# Patient Record
Sex: Male | Born: 1970
Health system: Southern US, Community
[De-identification: ages and names within clinical notes are randomized; demographics above are authoritative.]

## PROBLEM LIST (undated history)

## (undated) DIAGNOSIS — Z923 Personal history of irradiation: Secondary | ICD-10-CM

## (undated) DIAGNOSIS — C801 Malignant (primary) neoplasm, unspecified: Secondary | ICD-10-CM

---

## 1997-02-14 DIAGNOSIS — C8590 Non-Hodgkin lymphoma, unspecified, unspecified site: Secondary | ICD-10-CM

## 1997-02-14 HISTORY — DX: Non-Hodgkin lymphoma, unspecified, unspecified site: C85.90

## 2008-11-22 ENCOUNTER — Observation Stay (HOSPITAL_COMMUNITY): Admission: EM | Admit: 2008-11-22 | Discharge: 2008-11-22 | Payer: Self-pay | Admitting: Emergency Medicine

## 2010-03-09 ENCOUNTER — Inpatient Hospital Stay (HOSPITAL_COMMUNITY): Admission: EM | Admit: 2010-03-09 | Discharge: 2010-03-12 | Payer: Self-pay | Source: Home / Self Care

## 2010-03-10 LAB — DIFFERENTIAL
Basophils Absolute: 0.1 10*3/uL (ref 0.0–0.1)
Basophils Relative: 1 % (ref 0–1)
Eosinophils Absolute: 0.3 10*3/uL (ref 0.0–0.7)
Monocytes Absolute: 1 10*3/uL (ref 0.1–1.0)
Monocytes Relative: 9 % (ref 3–12)
Neutro Abs: 6.4 10*3/uL (ref 1.7–7.7)
Neutrophils Relative %: 60 % (ref 43–77)

## 2010-03-10 LAB — CBC
Hemoglobin: 11.4 g/dL — ABNORMAL LOW (ref 13.0–17.0)
MCH: 28.1 pg (ref 26.0–34.0)
MCHC: 34.5 g/dL (ref 30.0–36.0)
Platelets: 299 10*3/uL (ref 150–400)
Platelets: 270 10*3/uL (ref 150–400)
RBC: 4.11 MIL/uL — ABNORMAL LOW (ref 4.22–5.81)
WBC: 11.1 10*3/uL — ABNORMAL HIGH (ref 4.0–10.5)

## 2010-03-10 LAB — COMPREHENSIVE METABOLIC PANEL
ALT: 15 U/L (ref 0–53)
AST: 12 U/L (ref 0–37)
Albumin: 2.8 g/dL — ABNORMAL LOW (ref 3.5–5.2)
Alkaline Phosphatase: 63 U/L (ref 39–117)
Chloride: 104 mEq/L (ref 96–112)
Creatinine, Ser: 0.98 mg/dL (ref 0.4–1.5)
GFR calc Af Amer: >60 mL/min (ref >60)
Potassium: 4 mEq/L (ref 3.5–5.1)
Sodium: 139 mEq/L (ref 135–145)
Total Bilirubin: 0.4 mg/dL (ref 0.3–1.2)

## 2010-03-10 LAB — POCT I-STAT, CHEM 8
HCT: 42 % (ref 39.0–52.0)
Hemoglobin: 14.3 g/dL (ref 13.0–17.0)
Potassium: 4 mEq/L (ref 3.5–5.1)
Sodium: 140 mEq/L (ref 135–145)
TCO2: 28 mmol/L (ref 0–100)

## 2010-03-11 LAB — CBC
HCT: 32.9 % — ABNORMAL LOW (ref 39.0–52.0)
MCHC: 34.7 g/dL (ref 30.0–36.0)
MCV: 81.6 fL (ref 78.0–100.0)
RDW: 13.9 % (ref 11.5–15.5)

## 2010-03-11 NOTE — Op Note (Addendum)
NAMEBRANDOL, CORP                 ACCOUNT NO.:  [id number]  MEDICAL RECORD NO.:  [id number]          PATIENT TYPE:  INP  LOCATION:  0103                         FACILITY:  [facility]  PHYSICIAN:  Angelia Mould. Derrell Lolling, M.D.DATE OF BIRTH:  09-05-1970  DATE OF PROCEDURE:  03/10/2010 DATE OF DISCHARGE:                              OPERATIVE REPORT   PREOPERATIVE DIAGNOSES: 1. Acute and chronic gluteal abscesses. 2. Extensive, chronic genital and perianal condylomata. 3. Remote history Hodgkin's disease.  POSTOPERATIVE DIAGNOSES: 1. Acute and chronic gluteal abscesses. 2. Extensive, chronic genital and perianal condylomata. 3. Remote history Hodgkin's disease.  OPERATION PERFORMED: 1. Incision and drainage, multiple gluteal abscesses. 2. Excisional biopsy, perianal condylomata (rule out squamous cell     carcinoma).  SURGEON:  Angelia Mould. Derrell Lolling, M.D.  OPERATIVE INDICATIONS:  This is a 40 year old African-American gentleman who has had chronic perianal and genital abscesses and condylomata.  He had a right inguinal abscess incised and drained by Dr. Dwain Sarna in 2010 which healed.  He does not have regular medical care.  He has chronic fungating condylomata at the base of his penis, at the perianal areas and the inguinal areas but he has not sought any attention for this.  He says he intermittently has abscesses which spontaneously drain.  He comes in tonight because of increasing pain and swelling in the left gluteal area.  On exam, the entire left gluteal area near the midline is indurated.  There are couple of draining areas.  This feels a little bit fluctuant.  The skin is quite thickened and hard suggesting there may be a chronic component to this.  There are  multiple condylomata in the genital and perianal areas.  He was started on intravenous antibiotics and brought to the operating room for exam and drainage.  OPERATIVE FINDINGS:  Digital rectal exam revealed normal  sphincter tone and a normal anal canal and distal rectum, but there was extensive condylomata externally.  I did not feel any internally.  In the left gluteal area and left anterior and left posterior, there are multiple small abscesses.  In the right gluteal area in the right anterior, there was 1 small abscess.  None of these were confluent.  They did not connect with each other.  There was no necrosis.  This does not look like a Fournier's gangrene.  The scrotum was not infected.  The penis was not infected.  The inguinal areas did not appear infected.  OPERATIVE TECHNIQUE:  Following the induction of general endotracheal anesthesia, the patient was placed in dorsal lithotomy position. Perianal and genital areas were prepped and draped in a sterile fashion. Digital rectal exam was performed with the findings as described above. There did not appear to be any internal disease.  I placed about 4 radially oriented incisions in the left gluteal area.  I drained small abscesses.  I very aggressively spread the tissues and digitally explored them and they did not communicate with each other.  Cultures were taken.  I made a radially oriented incision in the right anterior gluteal area and drained a small abscess there.  I then excised  about a 2 x 2 cm area of condylomata in the perianal area and sent that to the lab to rule out squamous cell carcinoma.  Hemostasis was good, although the tissues were oozing somewhat.  I loosely packed all of the open wounds with 1-inch Iodoform gauze.  I then placed bulky absorbent bandages and fishnet panties, and the patient was taken to recovery room in stable condition.  Estimated blood loss was probably about 30 to 50 cc.  Complications were none.  Sponge, needle and instrument counts were correct.     Angelia Mould. Derrell Lolling, M.D.     HMI/MEDQ  D:  03/10/2010  T:  03/10/2010  Job:  [id number]  Electronically Signed by Claud Kelp M.D. on 03/11/2010  06:36:39 AM

## 2010-03-11 NOTE — H&P (Addendum)
Dylan [name], Dylan [name]                 ACCOUNT NO.:  [id number]  MEDICAL RECORD NO.:  [id number]          PATIENT TYPE:  EMS  LOCATION:  ED                           FACILITY:  [facility]  PHYSICIAN:  Angelia Mould. Derrell Lolling, M.D.DATE OF BIRTH:  May 10, 1970  DATE OF ADMISSION:  03/09/2010 DATE OF DISCHARGE:                             HISTORY & PHYSICAL   CHIEF COMPLAINT:  Pain and swelling in the left gluteal area for 48 hours.  HISTORY OF PRESENT ILLNESS:  This is a 40 year old African-American man who states that he has chronic warts in his penile and perianal areas for few years.  He has had a right groin abscess drained by Dr. Dwain Sarna on November 23, 2008.  He has occasional small abscesses that spontaneously drain.  He now presents with a 48-hour history of progressive pain and swelling of the left perianal gluteal area.  He has not had any drainage from this at this point in time.  I was asked to see him by the emergency department staff.  PAST HISTORY:  Treated for Hodgkin's disease with chemotherapy in the past.  He has a port in the right infraclavicular area.  He is not followed regularly by anyone, however.  He had incision and drainage of right groin abscess by Dr. Dwain Sarna on November 21, 2008.  He denies HIV. He denies hepatitis.  He denies asthma, diabetes, sickle cell or coronary artery disease.  CURRENT MEDICATIONS:  None.  DRUG ALLERGIES:  None known.  FAMILY HISTORY:  Mother living and well.  Father history unknown.  SOCIAL HISTORY:  The patient is single, lives with a daughter.  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  He denies any street drugs. He denies alcohol.  He smokes 1/2 pack of cigarettes per day.  REVIEW OF SYSTEMS:  Ten-system review of systems is negative except as described above.  PHYSICAL EXAMINATION:  GENERAL:  Alert, African-American male lying on his right side.  Temperature 99.6, heart rate 99, blood pressure 126/74, respiratory  rate 20, oxygen saturation 97% on room air. HEENT:  Eyes, sclerae are clear.  Extraocular movements intact. NECK:  Supple, nontender.  No mass.  Trachea midline.  No jugular venous distention. LUNGS:  Clear to auscultation, respirations unlabored. HEART:  Regular rate and rhythm.  No ectopy, no murmur.  Radial and femoral pulses are palpable. ABDOMEN:  Soft, nontender.  Normal bowel sounds, nondistended. GENITOURINARY:  He has chronic verrucous condylomata of the base of his penis in the suprapubic area and along the perianal area.  He has a large cellulitis and abscess involving the left anterior and left posterior area and perianal and gluteal areas.  There is lots of condyloma that is chronic around his perianal area. EXTREMITIES:  Moves all 4 extremities well without pain or deformity. NEUROLOGIC:  No gross motor or sensory deficits.  LABORATORY DATA:  Admission data; hemoglobin 13.3, white count 10,700 with normal differential.  BUN 8, creatinine 1.0, glucose 92, potassium 4.0.  CT scan of the pelvis showed superficial inflammation along the medial crease of the left buttock within the subcutaneous fat.  There are a  few little air bubbles here.  ASSESSMENT: 1. Acute soft tissue infection, left gluteal area. 2. Chronic verrucous lesions of the genitalia. 3. History of chemotherapy for Hodgkin's disease. 4. Status post Port-A-Cath insertion.  PLAN: 1. The patient will be taken to the operating room for examination     under anesthesia and drainage of his acute abscess. 2. I told him that I would not be able to handle or manage all of his     verrucous lesions as these were chronic and he would need to seek     management of those in the future electively. 3. I discussed the indications in details of surgery with him.  Risks     and complications have been outlined, including not limited to     bleeding, infection, injury to the rectum with incontinence,     extensive  debridement of tissues if the infection is necrotizing,     cardiac, pulmonary and thromboembolic problems.  He seems to     understand these issues well.  This time all his questions were     answered.  He is in full agreement with this plan.     Angelia Mould. Derrell Lolling, M.D.     HMI/MEDQ  D:  03/10/2010  T:  03/10/2010  Job:  [id number]  Electronically Signed by Claud Kelp M.D. on 03/11/2010 06:33:51 AM

## 2010-03-13 LAB — WOUND CULTURE

## 2010-03-15 LAB — ANAEROBIC CULTURE

## 2010-03-15 NOTE — Discharge Summary (Signed)
NAMEJUSTYCE, BABY                 ACCOUNT NO.:  [id number]  MEDICAL RECORD NO.:  [id number]          PATIENT TYPE:  INP  LOCATION:  1516                         FACILITY:  [facility]  PHYSICIAN:  Juanetta Gosling, MDDATE OF BIRTH:  1970-05-31  DATE OF ADMISSION:  03/09/2010 DATE OF DISCHARGE:  03/12/2010                              DISCHARGE SUMMARY   DISCHARGING PHYSICIAN:  Emelia Loron, M.D.  CONSULTATIONS:  None.  PROCEDURES: 1. Incision and drainage of multiple gluteal abscesses. 2. Excisional biopsy of perianal condyloma on March 10, 2010, by Dr.     Claud Kelp.  REASON FOR ADMISSION:  Mr. Mcdiarmid is a 40 year old African-American male who has chronic warts on his penile and perianal areas for a few years. He developed a right groin abscess that was drained approximately a year ago.  He developed worsening pain approximately 48 hours prior to admission with progressive swelling of the left perianal gluteal area. He presented to the Emergency Department for further evaluation of this area.  We were asked to evaluate the patient for surgical admission for drainage.  ADMITTING DIAGNOSES: 1. Acute soft tissue infection of the left gluteal area. 2. Chronic verrucous lesions of the genitalia. 3. History of chemotherapy for Hodgkin's disease. 4. Status post Port-A-Cath insertion.  HOSPITAL COURSE:  At this time the patient was admitted and placed on IV Invanz and vancomycin.  He was then taken to the operating room where he had multiple I and Ds of his gluteal abscesses as well as an excisional biopsy of one of his condyloma to rule out squamous cell carcinoma. Postoperatively the patient did well.  He initially began having b.i.d. dressing changes for several days along with sitz baths.  By postoperative day #2 the patient had a significant decrease in induration as well as edema.  After discussion with the physician, it was felt at this time that the patient  should continue sitz baths at home but did not require dressing changes.  His pathology results did come back on postoperative day #2 which revealed squamous cell carcinoma.  Therefore, at this time we are trying to get in touch with the Cancer Center to set the patient up with a medical oncologist for further treatment.  Otherwise the patient is felt stable for discharge home at this time.  DISCHARGE DIAGNOSES: 1. Multiple gluteal abscesses status post incision and drainage. 2. 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 3. History of Hodgkin's disease.  DISCHARGE MEDICATIONS:  Please see medication reconciliation form.  DISCHARGE INSTRUCTIONS:  The patient has no activity restrictions.  He is informed to do sitz baths t.i.d.  He has no dietary restrictions.  He is to follow up with Dr. Derrell Lolling in our office in approximately 2 weeks. He is also to follow up with the Oncology Center.  I have not been able to get in touch with them yet, but I have given them my pager number to call me back to set up an appointment for the patient.  I will then have them call the patient with that appointment time for appropriate  followup.     Letha Cape, PA   ______________________________ Juanetta Gosling, MD    KEO/MEDQ  D:  03/12/2010  T:  03/12/2010  Job:  [id number]  cc:   Angelia Mould. Derrell Lolling, M.D. [address], [address] [place] [address]  [facility]  Electronically Signed by Barnetta Chapel PA on 03/15/2010 01:19:20 PM Electronically Signed by Emelia Loron MD on 03/15/2010 04:26:16 PM

## 2010-04-07 ENCOUNTER — Ambulatory Visit (HOSPITAL_COMMUNITY)
Admission: RE | Admit: 2010-04-07 | Discharge: 2010-04-07 | Disposition: A | Discharge status: Home / Self Care | Payer: Medicare Other | Source: Ambulatory Visit | Attending: Hematology and Oncology | Admitting: Hematology and Oncology

## 2010-04-07 ENCOUNTER — Other Ambulatory Visit: Payer: Self-pay | Admitting: Hematology and Oncology

## 2010-04-07 ENCOUNTER — Encounter: Payer: Self-pay | Admitting: Hematology and Oncology

## 2010-04-07 ENCOUNTER — Encounter (HOSPITAL_BASED_OUTPATIENT_CLINIC_OR_DEPARTMENT_OTHER): Payer: Medicare Other | Admitting: Hematology and Oncology

## 2010-04-07 DIAGNOSIS — C210 Malignant neoplasm of anus, unspecified: Secondary | ICD-10-CM

## 2010-04-07 DIAGNOSIS — Y838 Other surgical procedures as the cause of abnormal reaction of the patient, or of later complication, without mention of misadventure at the time of the procedure: Secondary | ICD-10-CM | POA: Insufficient documentation

## 2010-04-07 DIAGNOSIS — Z95828 Presence of other vascular implants and grafts: Secondary | ICD-10-CM

## 2010-04-07 DIAGNOSIS — C8190 Hodgkin lymphoma, unspecified, unspecified site: Secondary | ICD-10-CM

## 2010-04-07 DIAGNOSIS — T82598A Other mechanical complication of other cardiac and vascular devices and implants, initial encounter: Secondary | ICD-10-CM | POA: Insufficient documentation

## 2010-04-07 LAB — CBC WITH DIFFERENTIAL/PLATELET
EOS%: 3.5 % (ref 0.0–7.0)
MCH: 27.7 pg (ref 27.2–33.4)
MCV: 83.4 fL (ref 79.3–98.0)
MONO%: 9 % (ref 0.0–14.0)
NEUT#: 3.6 10*3/uL (ref 1.5–6.5)
RBC: 4.76 10*6/uL (ref 4.20–5.82)
RDW: 14.5 % (ref 11.0–14.6)

## 2010-04-07 LAB — COMPREHENSIVE METABOLIC PANEL
AST: 13 U/L (ref 0–37)
Albumin: 4 g/dL (ref 3.5–5.2)
Alkaline Phosphatase: 80 U/L (ref 39–117)
Potassium: 4.8 mEq/L (ref 3.5–5.3)
Sodium: 138 mEq/L (ref 135–145)
Total Protein: 8.8 g/dL — ABNORMAL HIGH (ref 6.0–8.3)

## 2010-04-08 ENCOUNTER — Ambulatory Visit (HOSPITAL_COMMUNITY)
Admission: RE | Admit: 2010-04-08 | Discharge: 2010-04-08 | Disposition: A | Discharge status: Home / Self Care | Payer: Medicare Other | Source: Ambulatory Visit | Attending: Hematology and Oncology | Admitting: Hematology and Oncology

## 2010-04-08 ENCOUNTER — Other Ambulatory Visit: Payer: Self-pay | Admitting: Hematology and Oncology

## 2010-04-08 DIAGNOSIS — Y849 Medical procedure, unspecified as the cause of abnormal reaction of the patient, or of later complication, without mention of misadventure at the time of the procedure: Secondary | ICD-10-CM | POA: Insufficient documentation

## 2010-04-08 DIAGNOSIS — T82598A Other mechanical complication of other cardiac and vascular devices and implants, initial encounter: Secondary | ICD-10-CM | POA: Insufficient documentation

## 2010-04-08 DIAGNOSIS — C8190 Hodgkin lymphoma, unspecified, unspecified site: Secondary | ICD-10-CM | POA: Insufficient documentation

## 2010-04-08 DIAGNOSIS — C210 Malignant neoplasm of anus, unspecified: Secondary | ICD-10-CM

## 2010-04-08 MED ORDER — IOHEXOL 300 MG/ML  SOLN
30.0000 mL | Freq: Once | INTRAMUSCULAR | Status: AC | PRN
  Administered 2010-04-08: 30 mL via INTRAVENOUS

## 2010-04-08 MED ORDER — IOHEXOL 300 MG/ML  SOLN: 50.0000 mL | Freq: Once | INTRAMUSCULAR | Status: AC | PRN

## 2010-04-14 ENCOUNTER — Ambulatory Visit: Payer: Medicare Other | Admitting: Radiation Oncology

## 2010-04-19 ENCOUNTER — Encounter (INDEPENDENT_AMBULATORY_CARE_PROVIDER_SITE_OTHER): Payer: Medicare Other | Admitting: Surgery

## 2010-04-19 ENCOUNTER — Ambulatory Visit (HOSPITAL_COMMUNITY): Admission: RE | Admit: 2010-04-19 | Payer: Medicare Other | Source: Ambulatory Visit

## 2010-04-19 DIAGNOSIS — T80212A Local infection due to central venous catheter, initial encounter: Secondary | ICD-10-CM

## 2010-04-20 NOTE — Assessment & Plan Note (Signed)
OFFICE VISIT  Jarnagin, Vaishnav L DOB:  Nov 14, 1970                                       04/19/2010 ZOXWR#:[id number]  REASON FOR VISIT:  Retained catheter in the subclavian and superior vena cava.  HISTORY:  This is a 40 year old gentleman with a history of Hodgkin's disease.  He had a Port-A-Cath placed in [place], [place], approximately 8 years ago.  It has not been used for approximately 5 years.  He underwent attempted removal in radiology in February.  The catheter broke.  They were able to snare the distal end however it was unable to be with removed.  He was sent to me to evaluate for surgical revision.  Other than above mentioned the patient suffers from condyloma and has undergone 2 gluteal resections in the past.  He continues to be a smoker.  He smokes a pack which lasts him approximately 2 days. Otherwise he is healthy.  REVIEW OF SYSTEMS:  Detailed review of systems was performed as documented in the encounter form.  Is only positive for history of backside rash.  PAST MEDICAL HISTORY:  Positive for lymphoma and condyloma.  SOCIAL HISTORY:  He is single with 4 children.  Works as a Designer, industrial/product. Smokes 1 pack every 2 days.  Does not drink.  FAMILY HISTORY:  Negative for premature cardiovascular disease.  ALLERGIES:  None.  MEDICATIONS:  None.  PHYSICAL EXAM:  Vital signs:  Heart rate 91, blood pressure 121/82, temperature is 97.9.  General:  He is well-appearing, in no distress. HEENT:  Within normal limits.  Lungs:  Clear bilaterally. Cardiovascular:  Regular rate and rhythm.  Abdomen:  Soft, nontender. Musculoskeletal:  No major deformity.  Neurological:  No focal deficits.  DIAGNOSTIC STUDIES:  I have reviewed his angiogram from his attempted catheter removal.  There is a looped catheter in the subclavian extending up into the internal jugular as well as down into the superior vena cava.  ASSESSMENT:  Retained foreign body.  PLAN:   I discussed surgical removal of the catheter versus leaving it in.  We talked about the potential complications of the catheter remaining in place which include distal embolization in the pulmonary a nidus for thrombose versus infection.  The patient really wants to get this out.  I detailed the risk of the operation.  I plan on doing this through a supraclavicular incision.  We discussed potential injury to the vagus and phrenic nerve as well as possible lymphatic leak and bleeding as well as potential conversion to a trap door type incision. I quoted him approximately an 80%-90% success rate of getting the catheter out.  I have discussed this with Dr. Edwyna Shell.  He will be available for the case, this has been scheduled for Thursday March 15.    Jorge Ny, MD Electronically Signed  VWB/MEDQ  D:  04/19/2010  T:  04/20/2010  Job:  [id number]  cc:   Ines Bloomer, M.D. Arn Medal, MD Juanetta Gosling, MD

## 2010-04-27 ENCOUNTER — Encounter (HOSPITAL_COMMUNITY)
Admission: RE | Admit: 2010-04-27 | Discharge: 2010-04-27 | Disposition: A | Discharge status: Home / Self Care | Payer: Medicare Other | Source: Ambulatory Visit | Attending: Hematology and Oncology | Admitting: Hematology and Oncology

## 2010-04-27 ENCOUNTER — Encounter (HOSPITAL_COMMUNITY): Payer: Self-pay

## 2010-04-27 DIAGNOSIS — C210 Malignant neoplasm of anus, unspecified: Secondary | ICD-10-CM | POA: Insufficient documentation

## 2010-04-27 DIAGNOSIS — C8190 Hodgkin lymphoma, unspecified, unspecified site: Secondary | ICD-10-CM

## 2010-04-27 DIAGNOSIS — R9389 Abnormal findings on diagnostic imaging of other specified body structures: Secondary | ICD-10-CM | POA: Insufficient documentation

## 2010-04-27 DIAGNOSIS — R911 Solitary pulmonary nodule: Secondary | ICD-10-CM | POA: Insufficient documentation

## 2010-04-27 DIAGNOSIS — C8589 Other specified types of non-Hodgkin lymphoma, extranodal and solid organ sites: Secondary | ICD-10-CM | POA: Insufficient documentation

## 2010-04-27 HISTORY — DX: Malignant (primary) neoplasm, unspecified: C80.1

## 2010-04-27 LAB — GLUCOSE, CAPILLARY: Glucose-Capillary: 114 mg/dL — ABNORMAL HIGH (ref 70–99)

## 2010-04-27 MED ORDER — FLUDEOXYGLUCOSE F - 18 (FDG) INJECTION: 16.3000 | Freq: Once | INTRAVENOUS | Status: AC | PRN

## 2010-04-28 ENCOUNTER — Other Ambulatory Visit (HOSPITAL_COMMUNITY): Payer: Medicare Other

## 2010-04-28 ENCOUNTER — Encounter (HOSPITAL_BASED_OUTPATIENT_CLINIC_OR_DEPARTMENT_OTHER): Payer: Medicare Other | Admitting: Hematology and Oncology

## 2010-04-28 DIAGNOSIS — C210 Malignant neoplasm of anus, unspecified: Secondary | ICD-10-CM

## 2010-04-29 ENCOUNTER — Inpatient Hospital Stay (HOSPITAL_COMMUNITY): Payer: Medicare Other

## 2010-04-29 ENCOUNTER — Other Ambulatory Visit: Payer: Self-pay | Admitting: Surgery

## 2010-04-29 ENCOUNTER — Inpatient Hospital Stay (HOSPITAL_COMMUNITY)
Admission: RE | Admit: 2010-04-29 | Discharge: 2010-04-30 | DRG: 316 | Disposition: A | Discharge status: Home / Self Care | Payer: Medicare Other | Source: Ambulatory Visit | Attending: Surgery | Admitting: Surgery

## 2010-04-29 DIAGNOSIS — F17200 Nicotine dependence, unspecified, uncomplicated: Secondary | ICD-10-CM | POA: Diagnosis present

## 2010-04-29 DIAGNOSIS — T82898A Other specified complication of vascular prosthetic devices, implants and grafts, initial encounter: Principal | ICD-10-CM | POA: Diagnosis present

## 2010-04-29 DIAGNOSIS — Y849 Medical procedure, unspecified as the cause of abnormal reaction of the patient, or of later complication, without mention of misadventure at the time of the procedure: Secondary | ICD-10-CM | POA: Diagnosis present

## 2010-04-29 DIAGNOSIS — T81509A Unspecified complication of foreign body accidentally left in body following unspecified procedure, initial encounter: Secondary | ICD-10-CM

## 2010-04-29 LAB — COMPREHENSIVE METABOLIC PANEL
AST: 15 U/L (ref 0–37)
Albumin: 3.3 g/dL — ABNORMAL LOW (ref 3.5–5.2)
BUN: 7 mg/dL (ref 6–23)
Calcium: 9.1 mg/dL (ref 8.4–10.5)
Chloride: 106 mEq/L (ref 96–112)
Creatinine, Ser: 0.87 mg/dL (ref 0.4–1.5)
GFR calc Af Amer: >60 mL/min (ref >60)
GFR calc non Af Amer: >60 mL/min (ref >60)
Total Bilirubin: 0.3 mg/dL (ref 0.3–1.2)

## 2010-04-29 LAB — PROTIME-INR
INR: 1.02 (ref 0.00–1.49)
Prothrombin Time: 13.6 seconds (ref 11.6–15.2)

## 2010-04-29 LAB — TYPE AND SCREEN: Antibody Screen: NEGATIVE

## 2010-04-29 LAB — SURGICAL PCR SCREEN: MRSA, PCR: NEGATIVE

## 2010-04-29 LAB — CBC
MCH: 27.4 pg (ref 26.0–34.0)
MCHC: 33.6 g/dL (ref 30.0–36.0)
MCV: 81.6 fL (ref 78.0–100.0)
Platelets: 310 10*3/uL (ref 150–400)
RDW: 14.5 % (ref 11.5–15.5)

## 2010-04-29 LAB — ABO/RH: ABO/RH(D): O POS

## 2010-04-30 ENCOUNTER — Other Ambulatory Visit (HOSPITAL_COMMUNITY): Payer: Medicare Other

## 2010-04-30 LAB — BASIC METABOLIC PANEL
BUN: 5 mg/dL — ABNORMAL LOW (ref 6–23)
Calcium: 8.7 mg/dL (ref 8.4–10.5)
GFR calc non Af Amer: >60 mL/min (ref >60)
Glucose, Bld: 80 mg/dL (ref 70–99)
Potassium: 3.9 mEq/L (ref 3.5–5.1)

## 2010-04-30 LAB — CBC
HCT: 35 % — ABNORMAL LOW (ref 39.0–52.0)
MCHC: 33.1 g/dL (ref 30.0–36.0)
MCV: 81.6 fL (ref 78.0–100.0)
Platelets: 303 10*3/uL (ref 150–400)
RDW: 14.5 % (ref 11.5–15.5)
WBC: 10 10*3/uL (ref 4.0–10.5)

## 2010-05-10 ENCOUNTER — Ambulatory Visit (HOSPITAL_COMMUNITY): Admission: RE | Admit: 2010-05-10 | Payer: Medicare Other | Source: Ambulatory Visit | Admitting: General Surgery

## 2010-05-10 ENCOUNTER — Ambulatory Visit: Payer: Medicare Other | Admitting: Surgery

## 2010-05-16 NOTE — Op Note (Signed)
Dylan [name], Dylan [name]                 ACCOUNT NO.:  [id number]  MEDICAL RECORD NO.:  [id number]           PATIENT TYPE:  I  LOCATION:  2040                         FACILITY:  [facility]  PHYSICIAN:  Juleen China IV, MDDATE OF BIRTH:  [birth year]  DATE OF PROCEDURE:  04/29/2010 DATE OF DISCHARGE:                              OPERATIVE REPORT   PREOPERATIVE DIAGNOSIS:  Retained central venous catheter.  POSTOPERATIVE DIAGNOSIS:  Retained central venous catheter.  PROCEDURE PERFORMED:  Removal of central venous catheter from the right innominate vein, patch closure of the right internal jugular vein.  SURGEON: 1. Charlena Cross, MD  ASSISTANT:  Pecola Leisure, PA  ANESTHESIA:  General.  SPECIMENS:  Retained catheter.  INDICATIONS:  Dylan [name] is a 40 year old gentleman previously treated for Hodgkin's lymphoma via a right-sided Port-A-Cath.  He presented to my office last week.  He had an attempted radiology to have his port removed, however, the catheter portion broke, could not be retained. Therefore, he comes in for surgical extraction.  I did discuss preoperatively with the patient detailing the risks and benefits of the procedure, the possibility of nonretrieval.  The details and risks were fully explained including risks of nerve injury, bleeding, and lymphatic leak.  The patient was eager to get the catheter removed.  PROCEDURE:  The patient was identified in the holding area and taken to room 6, placed supine on table.  General endotracheal anesthesia was administered.  The patient was prepped and draped in usual fashion.  A time-out was called.  Antibiotics were given.  A supraclavicular incision was made on the right beginning in the midline extending from approximately 6 cm.  Cautery was used to grab the subcutaneous tissue and platysma muscle was divided with cautery.  Subplatysmal flaps were then raised.  I identified the sternoclavicular head of  the sternocleidomastoid.  These were well defined and therefore I elected to preserve them.  I divided the tissue between 2 flaps of the sternocleidomastoid and that exposed the internal jugular vein.  The internal jugular vein was circumferentially dissected free.  I visualized the vagus nerve, protected it at the posterior side of the wound.  I then traced the internal jugular vein as far as I could below the clavicle.  I was able to get circumferential control over the subclavian vein, however, due to the depth of the wound, I was unable to get control of right innominate vein.  There was an abundance of lymphatic tissue within the wound which required multiple suture ligation with 5-0 Prolene to control the lymphatic leak.  I did not feel like a good control of the innominate vein.  The catheter was readily palpable within the internal jugular vein.  Therefore, I elected to occlude the innominate subclavian vein with vascular clamps and I used a Cooley J clamp and placed it as far centrally as I could.  I placed a pursestring suture at the internal jugular vein.  I used #11 blade to open the vein.  Because I could not get a circumferential control of the innominate vein, there was some backbleeding from where the  clamp did not occlude the vein.  I was able to identify the catheter and grasped it with a hemostat.  The catheter was incorporated into the intima of the vein.  It was very thickened and scarred in this area.  I used combination of a blunt dissection with Peanut as well as sharp dissection with Metzenbaum scissors to get circumferential control of the catheter with this.  Once I had this, I used a gentle force to extract the catheter.  With this I was able to get hemostasis with a J- shaped clamp.  There was a thickened part of the vein.  I felt that we needed to explore this and remove this thickened intima to prevent future thrombosis.  Therefore, I opened the vein  longitudinally and resected out the thickened redundant part.  Once this was done, I was satisfied with the appearance of the posterior vein wall, however, due to the length of the venotomy, I did not want to close this primarily. I therefore selected a bovine pericardial patch and performed patchy venoplasty with a running 6-0 Prolene.  Prior to completion, the veins were flushed and the patch closure was completed.  I then had fluoroscopy come to do an intraoperative imaging to make sure that all the catheter had been retrieved.  I evaluated both lung fields as well as looked the catheter where it was located.  I did not see any retained portions.  With this I irrigated the wound.  There was still a generous lymphatic leak which I was able to oversew with 5-0 Prolene.  I inspected the wound meticulously for approximately 10 minutes.  I did not see any further lymphatic leak.  For concern over leak, I did place CoSeal at the wound to seal off the potential future leaks.  At this the wound was irrigated __________.  Hemostasis was excellent.  I reapproximated the heads of the sternocleidomastoid with a running 3-0 Vicryl.  The platysma muscle was then reapproximated with running 3-0 Vicryl and the skin was closed with 3-0 Vicryl and Dermabond was placed. He was extubated and taken to recovery in stable condition.     Dylan Ny, MD     VWB/MEDQ  D:  04/29/2010  T:  04/30/2010  Job:  [id number]  Electronically Signed by Arelia Longest IV MD on 05/16/2010 09:28:36 PM

## 2010-05-17 ENCOUNTER — Ambulatory Visit (INDEPENDENT_AMBULATORY_CARE_PROVIDER_SITE_OTHER): Payer: Medicare Other | Admitting: Surgery

## 2010-05-17 DIAGNOSIS — T80212A Local infection due to central venous catheter, initial encounter: Secondary | ICD-10-CM

## 2010-05-18 NOTE — Assessment & Plan Note (Signed)
OFFICE VISIT  [name], Dylan L DOB:  01/19/1971                                       05/17/2010 ZOXWR#:[id number]  The patient comes back today for followup.  He underwent removal of a retained central venous catheter from the right innominate vein as well as patch closure of the right internal jugular vein.  This was done on 04/29/2010.  The patient did not come for his postoperative visit. However, several days ago he noted severe swelling in his incision which spontaneously drained.  He has not been having any fevers.  It is no longer draining.  It is a little sore.  PHYSICAL EXAMINATION:  There is no evidence of infection.  The incision is well-healed.  I was able to express a minimal amount of a clear/yellowish fluid more consistent with lymphatic drainage.  There was no purulence to it.  The patient most likely had developed a lymphatic lymphocele which spontaneously drained.  I am going to place him on prophylactic antibiotics to make sure this does not get infected since I did close his vein with a patch.  He has been given Keflex to be taken for 10 days.  I have scheduled him to come back to see me in 1 week.  If he is doing well he is going to call and cancel this appointment.    Jorge Ny, MD Electronically Signed  VWB/MEDQ  D:  05/17/2010  T:  05/18/2010  Job:  [id number]

## 2010-05-20 LAB — COMPREHENSIVE METABOLIC PANEL
ALT: 11 U/L (ref 0–53)
AST: 17 U/L (ref 0–37)
Albumin: 3.8 g/dL (ref 3.5–5.2)
Alkaline Phosphatase: 76 U/L (ref 39–117)
Chloride: 103 mEq/L (ref 96–112)
GFR calc Af Amer: >60 mL/min (ref >60)
Potassium: 3.7 mEq/L (ref 3.5–5.1)
Sodium: 140 mEq/L (ref 135–145)
Total Bilirubin: 0.6 mg/dL (ref 0.3–1.2)
Total Protein: 8.3 g/dL (ref 6.0–8.3)

## 2010-05-20 LAB — ANAEROBIC CULTURE

## 2010-05-20 LAB — DIFFERENTIAL
Basophils Relative: 1 % (ref 0–1)
Eosinophils Absolute: 0.1 10*3/uL (ref 0.0–0.7)
Eosinophils Relative: 1 % (ref 0–5)
Monocytes Absolute: 0.8 10*3/uL (ref 0.1–1.0)
Monocytes Relative: 9 % (ref 3–12)
Neutro Abs: 6.1 10*3/uL (ref 1.7–7.7)

## 2010-05-20 LAB — CULTURE, BLOOD (ROUTINE X 2)
Culture: NO GROWTH
Culture: NO GROWTH

## 2010-05-20 LAB — CULTURE, ROUTINE-ABSCESS

## 2010-05-20 LAB — CBC
Platelets: 254 10*3/uL (ref 150–400)
RDW: 14.3 % (ref 11.5–15.5)
WBC: 9 10*3/uL (ref 4.0–10.5)

## 2010-05-24 ENCOUNTER — Ambulatory Visit: Payer: Medicare Other | Admitting: Surgery

## 2010-05-25 NOTE — Assessment & Plan Note (Signed)
OFFICE VISIT  Dylan [name], Dylan [name] DOB:  08-Mar-1970                                       05/24/2010 EAVWU#:[id number]  Did not show for his visit today.    Jorge Ny, MD Electronically Signed  VWB/MEDQ  D:  05/24/2010  T:  05/25/2010  Job:  [id number]

## 2010-06-15 ENCOUNTER — Other Ambulatory Visit: Payer: Self-pay | Admitting: Hematology and Oncology

## 2010-06-15 ENCOUNTER — Encounter (HOSPITAL_BASED_OUTPATIENT_CLINIC_OR_DEPARTMENT_OTHER): Payer: Medicare Other | Admitting: Hematology and Oncology

## 2010-06-15 DIAGNOSIS — C210 Malignant neoplasm of anus, unspecified: Secondary | ICD-10-CM

## 2010-06-15 LAB — COMPREHENSIVE METABOLIC PANEL
ALT: 20 U/L (ref 0–53)
AST: 14 U/L (ref 0–37)
Albumin: 3.5 g/dL (ref 3.5–5.2)
Alkaline Phosphatase: 86 U/L (ref 39–117)
Potassium: 3.8 mEq/L (ref 3.5–5.3)
Sodium: 136 mEq/L (ref 135–145)
Total Protein: 8.2 g/dL (ref 6.0–8.3)

## 2010-06-15 LAB — CBC WITH DIFFERENTIAL/PLATELET
EOS%: 3.8 % (ref 0.0–7.0)
MCH: 27.8 pg (ref 27.2–33.4)
MCV: 83 fL (ref 79.3–98.0)
MONO%: 8 % (ref 0.0–14.0)
NEUT#: 3.7 10*3/uL (ref 1.5–6.5)
RBC: 4.5 10*6/uL (ref 4.20–5.82)
RDW: 14.1 % (ref 11.0–14.6)

## 2010-06-18 ENCOUNTER — Ambulatory Visit (HOSPITAL_BASED_OUTPATIENT_CLINIC_OR_DEPARTMENT_OTHER): Admission: RE | Admit: 2010-06-18 | Payer: Medicare Other | Source: Ambulatory Visit | Admitting: Surgery

## 2010-06-22 ENCOUNTER — Other Ambulatory Visit: Payer: Self-pay | Admitting: Hematology and Oncology

## 2010-06-22 ENCOUNTER — Encounter (HOSPITAL_BASED_OUTPATIENT_CLINIC_OR_DEPARTMENT_OTHER): Payer: Medicare Other | Admitting: Hematology and Oncology

## 2010-06-22 DIAGNOSIS — C210 Malignant neoplasm of anus, unspecified: Secondary | ICD-10-CM

## 2010-06-22 LAB — CBC WITH DIFFERENTIAL/PLATELET
Basophils Absolute: 0 10*3/uL (ref 0.0–0.1)
EOS%: 5.1 % (ref 0.0–7.0)
MCH: 27.8 pg (ref 27.2–33.4)
MCV: 83.4 fL (ref 79.3–98.0)
MONO%: 8.7 % (ref 0.0–14.0)
RBC: 4.44 10*6/uL (ref 4.20–5.82)
RDW: 14.9 % — ABNORMAL HIGH (ref 11.0–14.6)

## 2010-06-23 ENCOUNTER — Encounter (INDEPENDENT_AMBULATORY_CARE_PROVIDER_SITE_OTHER): Payer: Self-pay | Admitting: General Surgery

## 2014-02-10 DIAGNOSIS — C210 Malignant neoplasm of anus, unspecified: Secondary | ICD-10-CM | POA: Insufficient documentation

## 2014-06-05 ENCOUNTER — Other Ambulatory Visit: Payer: Self-pay | Admitting: *Deleted

## 2014-06-05 DIAGNOSIS — C210 Malignant neoplasm of anus, unspecified: Secondary | ICD-10-CM

## 2014-06-05 NOTE — [facility] Oncology Navigator Note (Signed)
Received referral from [facility]/Dr. Adrian Prows for recurrent anal cancer/squamous cell that patient had delayed treatment that was suggested in March 2016. PET is negative for metastatic disease. Sent urgent referral to radiation oncology and will have Med Onc review chart.

## 2014-06-09 ENCOUNTER — Telehealth: Payer: Self-pay | Admitting: Oncology

## 2014-06-09 NOTE — Telephone Encounter (Signed)
new patient appt-s/w patient and gave appt for 05/17 @ 1:30 w/Dr. Benay Spice

## 2014-06-11 ENCOUNTER — Ambulatory Visit: Payer: Medicare Other

## 2014-06-11 ENCOUNTER — Ambulatory Visit: Payer: Medicare Other | Admitting: Oncology

## 2014-06-11 ENCOUNTER — Telehealth: Payer: Self-pay | Admitting: *Deleted

## 2014-06-11 NOTE — Telephone Encounter (Signed)
Spoke with patient and provided new patient appointment for 06/20/14 in GI [place] to arrive at 10:30 am. Will be seeing medical oncologist, radiation oncologist, PT, SW and dietician. Informed of location of [place], valet service, and registration process. Reminded to bring insurance cards and a current medication list, including supplements. Patient verbalizes understanding. He does not have a copy of his PET scan with him. Called and left VM for GI Oncology Navigator, Bertram Savin, RN to please have his PET of 05/01/14 put on CD and sent to office. Requested she call and confirm she can do this for [place].

## 2014-06-11 NOTE — Telephone Encounter (Signed)
Called Dylan [name] regarding his appointment today. He was not aware it had been changed today. Will try to get him in the GI [place] on 5/6 and see if his 5/9 with Dr. Lisbeth Renshaw can be changed to 5/6. He would like this if possible.

## 2014-06-12 ENCOUNTER — Other Ambulatory Visit: Payer: Self-pay | Admitting: Oncology

## 2014-06-12 ENCOUNTER — Encounter: Payer: Self-pay | Admitting: *Deleted

## 2014-06-12 ENCOUNTER — Inpatient Hospital Stay
Admission: RE | Admit: 2014-06-12 | Discharge: 2014-06-12 | Disposition: A | Discharge status: Home / Self Care | Payer: Self-pay | Source: Ambulatory Visit | Attending: Oncology | Admitting: Oncology

## 2014-06-12 DIAGNOSIS — C8190 Hodgkin lymphoma, unspecified, unspecified site: Secondary | ICD-10-CM

## 2014-06-12 NOTE — [facility] Oncology Navigator Note (Signed)
CD of PET scan received from [facility]. Taken to radiology file room to have loaded into [facility] system. Requested call back when ready to pick up CD.

## 2014-06-13 ENCOUNTER — Other Ambulatory Visit: Payer: Self-pay | Admitting: Oncology

## 2014-06-13 ENCOUNTER — Inpatient Hospital Stay
Admission: RE | Admit: 2014-06-13 | Discharge: 2014-06-13 | Disposition: A | Discharge status: Home / Self Care | Payer: Self-pay | Source: Ambulatory Visit | Attending: Oncology | Admitting: Oncology

## 2014-06-13 DIAGNOSIS — C801 Malignant (primary) neoplasm, unspecified: Secondary | ICD-10-CM

## 2014-06-20 ENCOUNTER — Encounter: Payer: Self-pay | Admitting: *Deleted

## 2014-06-20 ENCOUNTER — Encounter: Payer: Self-pay | Admitting: Oncology

## 2014-06-20 ENCOUNTER — Ambulatory Visit
Admission: RE | Admit: 2014-06-20 | Discharge: 2014-06-20 | Disposition: A | Discharge status: Home / Self Care | Payer: Medicare Other | Source: Ambulatory Visit | Attending: Radiation Oncology | Admitting: Radiation Oncology

## 2014-06-20 ENCOUNTER — Ambulatory Visit: Payer: Medicare Other | Admitting: Nutrition

## 2014-06-20 ENCOUNTER — Ambulatory Visit (HOSPITAL_BASED_OUTPATIENT_CLINIC_OR_DEPARTMENT_OTHER): Payer: Medicare Other | Admitting: Oncology

## 2014-06-20 ENCOUNTER — Ambulatory Visit: Payer: Medicare Other | Attending: General Surgery | Admitting: Physical Therapy

## 2014-06-20 ENCOUNTER — Telehealth: Payer: Self-pay | Admitting: Hematology

## 2014-06-20 VITALS — BP 144/77 | HR 77 | Temp 97.8°F | Resp 17 | Ht 73.0 in | Wt 194.1 lb

## 2014-06-20 DIAGNOSIS — C210 Malignant neoplasm of anus, unspecified: Secondary | ICD-10-CM

## 2014-06-20 DIAGNOSIS — G893 Neoplasm related pain (acute) (chronic): Secondary | ICD-10-CM

## 2014-06-20 DIAGNOSIS — Z8572 Personal history of non-Hodgkin lymphomas: Secondary | ICD-10-CM

## 2014-06-20 DIAGNOSIS — K6289 Other specified diseases of anus and rectum: Secondary | ICD-10-CM | POA: Insufficient documentation

## 2014-06-20 DIAGNOSIS — Z8589 Personal history of malignant neoplasm of other organs and systems: Secondary | ICD-10-CM | POA: Diagnosis not present

## 2014-06-20 MED ORDER — OXYCODONE-ACETAMINOPHEN 5-325 MG PO TABS: 1.0000 | ORAL_TABLET | ORAL | Status: DC | PRN

## 2014-06-20 NOTE — [facility] Oncology Navigator Note (Signed)
Met with patient  during [facility] [place] patient visit. Explained the role of the GI Nurse Navigator and provided New Patient Packet with information on: 1.  Anal cancer 2. Support groups 3. Advanced Directives 4. Fall Safety Plan Answered questions, reviewed current treatment plan using TEACH back and provided emotional support. He declined to fill out the Distress Screen, but tells nurse "I'm all right. I have no transportation issues and my daughters will help." Provided copy of current treatment plan. Will follow [place] closely, he is high risk for not completing therapy.  Merceda Elks, RN, BSN GI Oncology [place]

## 2014-06-20 NOTE — Progress Notes (Signed)
[facility] New Patient Consult   Referring ZH:GDJME Stitzenberg  Dylan [name] 44 y.o.  1970-06-14    Reason for Referral: Anal cancer   HPI: Dylan [name] has been followed for anal condylomata since 2012. He underwent an excision of a condylomata in the perianal region by Dylan [name] in January 2012 with the pathology confirming at least squamous cell carcinoma in situ. A PET scan in 2012 revealed a hypermetabolic area in the perianal region. A hypermetabolic focus was noted at the right base of the penis and there is diffuse left scrotal uptake. A 4 mm right middle lobe nodule was too small to characterize.  He was evaluated at [facility] in June 2012. An FNA biopsy of a left inguinal lymph node revealed no malignant cells. An exam under anesthesia included an incisional biopsy of a suprapubic condyloma, a left inguinal lymph node biopsy, and incision and drainage of bilateral gluteal abscesses. A thickened plaque of condylomata was noted at the left hemiscrotum with purulent drainage. A family are condyloma extending around the entire perianal area and perineum, right medial thigh and hemiscrotum. The left inguinal lymph node biopsy revealed reactive hyperplasia. The suprapubic condyloma revealed extensive high-grade squamous dysplasia, but no invasion. On 08/03/2010 excision and fulguration of the suprapubic and scrotal disease was performed. The pathology from the suprapubic skin excision revealed condyloma acuminata with extensive squamous cell carcinoma in situ. Completely excised. The pathology from the left scrotum revealed invasive squamous carcinoma and squamous cell carcinoma in situ arising in a condyloma acuminatum. The deep margin was focally involved with squamous cell carcinoma in situ. In August 2012 he underwent additional excision of perianal condyloma with drainage of multiple abscesses and fulguration of right 5 condylomas. The pathology from the skin excision revealed  invasive squamous cell carcinoma. The margins were negative. He was lost to follow-up and return to [facility] in December 2015. A left but not 6 cm open ulcer was noted with surrounding induration and condyloma. This was noted to be contiguous with a perianal condyloma. A cluster covered the anus. A 2 cm ulcerated patch and condyloma was noted at the left scrotum. An exam under anesthesia on 02/18/2014 revealed a condyloma of the penis, medial thighs, left scrotum, and perianal region. A large ulcer was noted at the left posterior perianal area with no extension into the anal canal. Multiple biopsies were obtained. Condyloma of the proximal glands and PE pubic scan were noted. The biopsy from the left scrotum showed high-grade squamous intraepithelial neoplasia with no invasive carcinoma. The foreskin biopsy revealed no invasive carcinoma. The left perianal biopsy confirmed invasive squamous cell carcinoma.  A PET scan at [facility] on 05/01/2014 revealed an intensely hypermetabolic left external iliac node and hypermetabolic bilateral inguinal nodes. Linear increased uptake was noted in the left but not with a small focus of increased activity in the left groin-significantly smaller than on a prior exam. Intense uptake was noted to distal penis. He was referred to medical oncology at [facility]. He was evaluated by Dylan [name]. Concurrent chemotherapy and radiation was recommended. Dylan [name] decided to pursue treatment closer to home. He continues to have pain at the perianal region following the most recent biopsy  Past Medical History  Diagnosis Date  .  Hodgkin's lymphoma-treated with chemotherapy   1996     Past surgical history: As per history of present illness, history of a Port-A-Cath placement  Medications: Reviewed  Allergies: No Known Allergies  Family history: Noncontributory  Social History:  He lives in [place]. He has daughters and a sister in town. He smokes one quarter pack of cigarettes per  day. He does not use all. No transfusion history. No risk factor for HIV or hepatitis.  History  Alcohol Use No    History  Smoking status  . Current Every Day Smoker -- 0.30 packs/day  . Types: Cigarettes  Smokeless tobacco  . Not on file      ROS:   Positives include: Bleeding following bowel movements, pain at the anus  A complete ROS was otherwise negative.  Physical Exam:  Blood pressure 144/77, pulse 77, temperature 97.8 F (36.6 C), temperature source Oral, resp. rate 17, height 6\' 1"  (1.854 m), weight 194 lb 1.6 oz (88.043 kg), SpO2 100 %.  HEENT: Oropharynx without visible mass, neck without mass Lungs: Clear bilaterally Cardiac: Regular rate and rhythm Abdomen: No hepatosplenomegaly, nontender, no mass GU: Condyloma at the left proximal scrotum, white discoloration of the distal shaft of the penis without a discrete mass  Vascular: No leg edema Lymph nodes: No cervical, supraclavicular, or inguinal nodes. "Shotty "bilateral axillary nodes Neurologic: Alert and oriented, the motor exam appears intact in the upper and lower extremities Skin: No rash Rectal: There is extensive skin breakdown with erythema surrounding the perineum, nodular thickening at the left anal margin extending toward the anal verge Musculoskeletal: No spine tenderness   LAB:  02/10/2014 at [facility]-white count 7.5, 1113.5, platelets are 71,000, creatinine 1.0 to HIV- negative   Imaging:  PET scan from [facility] 05/01/2014 reviewed   Assessment/Plan:   1. Invasive squamous cell carcinoma the anus, status post a biopsy of the left perianal region 02/18/2014 confirming invasive moderate to poorly differentiated carcinoma. Invasive squamous cell carcinoma was noted at the left scrotum in June 2012.  Staging PET scan at [facility] 05/01/2014 revealed a hypermetabolic left external iliac node, prominent bilateral hypermetabolic inguinal nodes, increased uptake in the soft tissues of the left buttock,  increased uptake in the distal penis 2. Recurrent perianal/scrotal condylomata with associated squamous cell carcinoma in situ-status post multiple surgical excision procedures at [facility] since 2012 3. Pain secondary to #1 4. Right axillary lymph node with increased metabolic activity on the PET scan 05/01/2014 5. Hodgkin's lymphoma 1996   Disposition:   Mr. Settlemire has a history of extensive perianal condylomata and has undergone multiple biopsy/excision procedures dating to 2012. He has a history of squamous cell carcinoma in situ and invasive squamous cell carcinoma. He was most recently confirmed to have invasive squamous cell carcinoma involving a biopsy of the left perianal region. Mr. Sayed is symptomatic with pain secondary to tumor and skin breakdown at the perineum.  He appears to have a locally advanced anal cancer with possible involvement of inguinal/pelvic lymph nodes. The significance of the small right axillary node is unclear.  He was evaluated in medical oncology and radiation oncology at [facility] and concurrent chemotherapy/radiation was recommended. Mr. Comas was seen in the GI oncology multidisciplinary clinic today. We agree with the recommendation for chemotherapy/radiation. I recommend 5-fluorouracil/mitomycin-C to be given concurrent with radiation. We reviewed the specific toxicities associated with the 5-fluorouracil/mitomycin-C regimen including the chance for nausea/vomiting, mucositis, alopecia, diarrhea, and hematologic toxicity. We discussed the hyperpigmentation and hand/foot syndrome associated with 5-fluorouracil. We reviewed the hemolytic uremic syndrome rarely seen in patients treated with mitomycin-C. He understands the potential for skin breakdown at the groin, scrotum, and perineum with combined modality therapy. Mr. Sirmon will attend a chemotherapy teaching class. He agrees to  proceed.  He was given a prescription for oxycodone to use as needed for pain.  Dr. Lisbeth Renshaw  will decide on the extent of the radiation treatment field. The plan is to begin combined treatment on 07/07/2014. He will be referred for placement of a PICC prior to beginning chemotherapy. I encouraged him to discontinue smoking, especially with the increased risk of radiation skin toxicity.  Approximate 50 minutes were spent with patient today. The majority of the time was used for counseling and coordination of care.  Zyana Amaro 06/20/2014, 1:40 PM

## 2014-06-20 NOTE — Therapy (Addendum)
[facility] [address], [place], [place], [address] Phone: [phone]   Fax:  [phone]  Physical Therapy Evaluation  Patient Details  Name: Dylan [name] MRN: [id number] Date of Birth: 1970-10-17 Referring Provider:  Stark Klein, MD  Encounter Date: 06/20/2014      PT End of Session - 06/20/14 1244    Visit Number 1   PT Start Time 1217   PT Stop Time 1239   PT Time Calculation (min) 22 min   Activity Tolerance Patient tolerated treatment well   Behavior During Therapy [facility] for tasks assessed/performed      Past Medical History  Diagnosis Date  . Cancer     No past surgical history on file.  There were no vitals filed for this visit.  Visit Diagnosis:  Anal pain - Plan: PT plan of care cert/re-cert      Subjective Assessment - 06/20/14 1235    Subjective Patient reports discomfort in anal region.    Limitations Sitting   How long can you sit comfortably? sits on right side   Currently in Pain? Yes   Pain Score 4    Pain Location Perineum   Pain Orientation Mid   Pain Descriptors / Indicators Sharp   Pain Type Chronic pain   Pain Onset More than a month ago   Pain Frequency Constant   Aggravating Factors  sitting   Pain Relieving Factors laying down   Multiple Pain Sites No            [facility] PT Assessment - 06/20/14 0001    Assessment   Medical Diagnosis Anal Cancer   Onset Date 06/20/14   Prior Therapy None   Precautions   Precautions Other (comment)   Precaution Comments No ultrasound   Balance Screen   Has the patient fallen in the past 6 months No   Has the patient had a decrease in activity level because of a fear of falling?  No   Is the patient reluctant to leave their home because of a fear of falling?  No   Prior Function   Level of Independence Independent with basic ADLs   Leisure walks daily   Posture/Postural Control   Posture/Postural Control Postural limitations    Postural Limitations Rounded Shoulders;Forward head;Weight shift right   Posture Comments sits on right buttocks   AROM   Overall AROM Comments full lumbar ROM   Strength   Overall Strength Comments bil. hips 5/5                           PT Education - 06/20/14 1243    Education provided Yes   Education Details Toileting technique, Skin care of the anal area, energy conservation, pelvic floor contraction, walking program   Person(s) Educated Patient   Methods Explanation;Demonstration;Handout   Comprehension Returned demonstration;Verbalized understanding     G-code is therapist discretion is 5% limitation for being in GI clinic for 1 time visit. Patient is in other category for other PT and is CE for current status, CI for goal status, and discharge status. Earlie Counts, PT 08/27/2014 9:33 AM          PT Long Term Goals - 06/20/14 1248    PT LONG TERM GOAL #1   Title understand ways to toilet correctly to avoid straining after a bowel movement   Time 1   Period Days   Status Achieved   PT  LONG TERM GOAL #2   Title understand how to take care of the skin area while having radiation    Time 1   Period Days   Status Achieved   PT LONG TERM GOAL #3   Title understand walking program and how to conserve energy during treatment for the anal cancer   Period Days   Status Achieved   PT LONG TERM GOAL #4   Title how to contract the pelvic floor to maintain tone during the radiation to the anal area   Time 1   Period Days   Status Achieved               Plan - 06/20/14 1244    Clinical Impression Statement Patient is a 44 year old male with anal cancer. Patient was seen in GI clinic for assessment from physical therapy. Patient was educated on toileting technique to decrease straing during bowel movment, pelvic floor contraction to maintain strength, walking program to keep endurance up, energy conservation during his chemotherapy and radiation  treatment and skin care for during radiation.    Pt will benefit from skilled therapeutic intervention in order to improve on the following deficits Pain;Decreased activity tolerance;Decreased knowledge of precautions   Rehab Potential Good   PT Frequency 1x / week   PT Duration --  1 time in GI clinic   PT Treatment/Interventions ADLs/Self Care Home Management;Therapeutic exercise;Patient/family education;Energy conservation;Therapeutic activities   PT Next Visit Plan only if needed after chemotherapy and radiation treatment   PT Home Exercise Plan current HEP   Recommended Other Services None   Consulted and Agree with Plan of Care Patient         Problem List Patient Active Problem List   Diagnosis Date Noted  . Anal cancer 02/10/2014    Saron Tweed,PT 06/20/2014, 12:52 PM  [facility] [address] [place], [place], [address] Phone: [phone]   Fax:  [phone]

## 2014-06-20 NOTE — Progress Notes (Signed)
Patient was seen in GI clinic.  44 year old male diagnosed with anal cancer seen in GI clinic.  Past medical history includes non-Hodgkin's lymphoma in 1999.  Medications include multivitamin.  Labs were reviewed.  Height: 73 inches. Weight: 194.1 pounds. Usual body weight: 195 pounds. BMI: 25.61.  Patient originally diagnosed with anal cancer in 2012 but was lost to follow-up. Patient denies weight loss or difficulty eating. Per RN patient has large wound, approximately 1 inch deep.   Expected increased nutrition needs for healing.  Nutrition diagnosis: Food and nutrition related knowledge deficit related to diagnosis of anal cancer as evidenced by no prior need for nutrition related information.  Intervention: Educated patient to consume adequate calories and increased protein to promote healing. Reviewed high-calorie high-protein foods with patient. Provided fact sheets. Recommended patient add oral nutrition supplements if oral intake decreases. Questions were answered and teach back method used.  Monitoring, evaluation, goals: Patient will tolerate adequate calories and increased protein to promote healing.  Next visit: To be scheduled.  **Disclaimer: This note was dictated with voice recognition software. Similar sounding words can inadvertently be transcribed and this note may contain transcription errors which may not have been corrected upon publication of note.**

## 2014-06-20 NOTE — Telephone Encounter (Signed)
Lft msg for pt confirming labs/ov and chemo education class per 05/06 POF, sent msg to add chemo and will mail out updated schedule.Marland Kitchen..KJ

## 2014-06-20 NOTE — Patient Instructions (Signed)
Skin Care and Bowel Hygiene  Anyone who has frequent bowel movements, diarrhea, or bowel leakage (fecal incontinence) may experience soreness or skin irritation around the anal region.  Occasionally, the skin can become so inflamed that it breaks into open sores.  Prevent skin breakdown by following good skin care habits.  Cleaning and Washing Techniques After having a bowel movement, men and women should tighten their anal sphincter before wiping.  Women should always wipe from front to back to prevent fecal matter from getting into the urethra and vagina.   Tips for Cleaning and Washing . wipe from front to back towards the anus . always wipe gently with soft toilet paper, or ideally with moist toilet paper . wipe only once with each piece of toilet paper so as not to re-contaminate the area . wash in warm water alone or with a minimal amount of mild, fragrance-free soap . use non-biological washing powder . gently pat skin completely dry, avoiding rubbing . if drying the skin after washing is difficult or uncomfortable, try using a hairdryer on a low setting (use very carefully) . allow air to get to the irritated area for some part of every day . use protective skin creams containing zinc as recommended by your doctor  What To Avoid . baths with extra-hot water . soaking for long periods of time in the bathtub . disinfectants and antiseptics  . bath oils, bath salts, and talcum powder . using plastic pants, pads, and sheets, which cause sweating . scratching at the irritated area  Additional Tips . some people find that citrus and acidic foods cause or worsen skin irritation . eat a healthy, balanced diet that is high in fiber  . drink plenty of fluids . wear cotton underwear to allow the skin to breath . talk to your healthcare provider about further treatment options; persistent problems need medical attention   Tips for Energy Conservation for Activities of Daily  Living . Plan ahead to avoid rushing. . Sit down to bathe and dry off. Wear a terry robe instead of drying off. . Use a shower/bath organizer to decrease leaning and reaching. . Use extension handles on sponges and brushes. . Install grab rails in the bathroom or use an elevated toilet seat. . Lay out clothes and toiletries before dressing. . Minimize leaning over to put on clothes and shoes. Bring your foot to your knee to apply socks and shoes. . Wear comfortable shoes and low-heeled, slip on shoes. Wear button front shirts rather than pullovers. Housekeeping . Schedule household tasks throughout the week. . Do housework sitting down when possible. . Delegate heavy housework, shopping, laundry and child care when possible. . Drag or slide objects rather than lifting. . Sit when ironing and take rest periods. . Stop working before becoming overly tired. Shopping . Organize list by aisle. . Use a grocery cart for support. . Shop at less busy times. . Ask for help with getting to the car. Meal Preparation . Use convenience and easy-to-prepare foods. . Use small appliances that take less effort to use. . Prepare meals sitting down. . Soak dishes instead of scrubbing and let dishes air dry. . Prepare double portions and freeze half. Child Care . Plan activities that can be done sitting down, such as drawing pictures, playing games, reading, and computer games. . Encourage children to climb up onto your lap or into the highchair instead of being lifted. . Make a game of the household chores so that children   will want to help. . Delegate child care when possible.  Pelvic Floor Exercises for Bowel Control Exercises using both the external anal sphincter and the deep pelvic floor muscles can help you to improve your bowel control. When done correctly, these exercises can tone and strengthen the muscles to help you hold back gas and prevent fecal incontinence (leakage of stool). Exercise  programs take time; you may not see any noticeable change in your bowel control immediately.  In some cases it may take several months to regain control. Bowel Control Muscles The anus and the anal canal, has rings of muscle around it. The outer ring of muscle is called the external anal sphincter; it is a voluntary muscle which you can learn to tighten and close more efficiently. When you contract it you will feel the skin around your anus tighten and pull in as if the anus is winking. Try to keep the buttocks muscles relaxed. The inner ring around the anus is the internal anal sphincter. It is an involuntary and automatic muscle; you don't have to think to keep it closed or open.  This muscle should be closed at all times, except when you are actually trying to have a bowel movement.  In addition to the sphincter muscles, there are deeper muscles called the levator ani that form a sling from your tailbone to your pubic bone. The levator ani muscle has a specific part called the puborectalis that holds stool in until you give the signal to relax and empty.  When you contract these muscles it creates a feeling of lifting the anus inward.   External Anal Sphincter     Levator ani deep layer   Effective Exercises for Control of Gas and Bowels . Identify the specific areas of the pelvic floor muscles you need to use.  This can be done using a mirror to see if you are contracting the correct muscles or by placing the pad of your finger at or just inside the anal opening. . Develop an exercise plan for strength, endurance and quick response of the muscles and stick with it.  You must make the muscles do more than they are used to doing. . Incorporate the exercises into your daily activities. Toileting Techniques for Bowel Movements (Defecation) Using your belly (abdomen) and pelvic floor muscles to have a bowel movement is usually instinctive.  Sometimes people can have problems with these muscles and have  to relearn proper defecation (emptying) techniques.  If you have weakness in your muscles, organs that are falling out, decreased sensation in your pelvis, or ignore your urge to go, you may find yourself straining to have a bowel movement.  You are straining if you are: . holding your breath or taking in a huge gulp of air and holding it  . keeping your lips and jaw tensed and closed tightly . turning red in the face because of excessive pushing or forcing . developing or worsening your  hemorrhoids . getting faint while pushing . not emptying completely and have to defecate many times a day  If you are straining, you are actually making it harder for yourself to have a bowel movement.  Many people find they are pulling up with the pelvic floor muscles and closing off instead of opening the anus. Due to lack pelvic floor relaxation and coordination the abdominal muscles, one has to work harder to push the feces out.  Many people have never been taught how to defecate efficiently and effectively.  Notice what happens to your body when you are having a bowel movement.  While you are sitting on the toilet pay attention to the following areas: . Jaw and mouth position . Angle of your hips   . Whether your feet touch the ground or not . Arm placement  . Spine position . Waist . Belly tension . Anus (opening of the anal canal)  An Evacuation/Defecation Plan   Here are the 4 basic points:  1. Lean forward enough for your elbows to rest on your knees 2. Support your feet on the floor or use a low stool if your feet don't touch the floor  3. Push out your belly as if you have swallowed a beach ball-you should feel a widening of your waist 4. Open and relax your pelvic floor muscles, rather than tightening around the anus      The following conditions my require modifications to your toileting posture:  . If you have had surgery in the past that limits your back, hip, pelvic, knee or ankle  flexibility . Constipation   Your healthcare practitioner may make the following additional suggestions and adjustments:  1) Sit on the toilet  a) Make sure your feet are supported. b) Notice your hip angle and spine position-most people find it effective to lean forward or raise their knees, which can help the muscles around the anus to relax  c) When you lean forward, place your forearms on your thighs for support  2) Relax suggestions a) Breath deeply in through your nose and out slowly through your mouth as if you are smelling the flowers and blowing out the candles. b) To become aware of how to relax your muscles, contracting and releasing muscles can be helpful.  Pull your pelvic floor muscles in tightly by using the image of holding back gas, or closing around the anus (visualize making a circle smaller) and lifting the anus up and in.  Then release the muscles and your anus should drop down and feel open. Repeat 5 times ending with the feeling of relaxation. c) Keep your pelvic floor muscles relaxed; let your belly bulge out. d) The digestive tract starts at the mouth and ends at the anal opening, so be sure to relax both ends of the tube.  Place your tongue on the roof of your mouth with your teeth separated.  This helps relax your mouth and will help to relax the anus at the same time.  3) Empty (defecation) a) Keep your pelvic floor and sphincter relaxed, then bulge your anal muscles.  Make the anal opening wide.  b) Stick your belly out as if you have swallowed a beach ball. c) Make your belly wall hard using your belly muscles while continuing to breathe. Doing this makes it easier to open your anus. d) Breath out and give a grunt (or try using other sounds such as ahhhh, shhhhh, ohhhh or grrrrrrr).  4) Finish a) As you finish your bowel movement, pull the pelvic floor muscles up and in.  This will leave your anus in the proper place rather than remaining pushed out and down. If  you leave your anus pushed out and down, it will start to feel as though that is normal and give you incorrect signals about needing to have a bowel movement.     Ways to get started on an exercise program 1.  Start for 10 minutes per day with a walking program. 2. Work towards 30 minutes of exercise  per day 3. When you do an aerobic exercise program start on a low level 4. Water aerobics is a good place due to decreased strain on your joints 5. Begin your exercise program gradually and progress slowly over time 6. When exercising use correct form. a. Keep neutral spine b. Engage abdominals c. Keep chest up  d. Chin down e. Do not lock your knees

## 2014-06-23 ENCOUNTER — Ambulatory Visit: Payer: Medicare Other

## 2014-06-23 ENCOUNTER — Telehealth: Payer: Self-pay | Admitting: Nurse Practitioner

## 2014-06-23 ENCOUNTER — Ambulatory Visit: Payer: Medicare Other | Admitting: Radiation Oncology

## 2014-06-23 NOTE — Telephone Encounter (Signed)
Chemo scheduled per staff message °

## 2014-06-24 ENCOUNTER — Encounter: Payer: Self-pay | Admitting: Radiation Oncology

## 2014-06-24 NOTE — Progress Notes (Signed)
Radiation Oncology         [phone]) [phone] ________________________________  Name: Dylan [name] MRN: [id number]  Date: 06/20/2014  DOB: 1970/08/20  CC:No PCP Per Patient  Odogwu, Genevie Cheshire, MD     REFERRING PHYSICIAN: Odogwu, Genevie Cheshire, MD   DIAGNOSIS: The encounter diagnosis was Anal cancer.   HISTORY OF PRESENT ILLNESS::Dylan [name] is a 44 y.o. male who is seen for an initial consultation visit regarding the patient's diagnosis of anal cancer. 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 He underwent excision in January of that year which demonstrated squamous cell carcinoma in situ. Imaging involving a PET scan revealed hypermetabolic activity in the perianal region.  The patient has also been seen at [facility]. He was seen there in June. A left inguinal lymph node underwent a fine-needle aspiration and this did not reveal any malignant cells. An incisional biopsy of a suprapubic condyloma and a left inguinal lymph node biopsy were performed. The suprapubic area showed high-grade squamous dysplasia without any invasion. Reactive hyperplasia was seen within the left inguinal lymph node. He underwent excision and fulguration involving the suprapubic and scrotal disease which was seen at that time. Final pathology revealed extensive squamous cell carcinoma in situ. The patient's pathology from the left scrotum showed invasive squamous cell carcinoma and squamous cell carcinoma in situ arising from the condyloma. The deep margin was focally involved with squamous cell carcinoma in situ. In August 2012 the patient underwent additional excision and drainage of multiple abscesses. The pathology from the skin excision again revealed invasive squamous cell carcinoma. The margins were negative. Next  The patient then was lost to follow-up and he returned to [facility] late last year. An ulcer was seen within the  left buttock region in the perianal region with surrounding induration and condyloma. A 27 m ulcerated patch and condyloma was seen in the left scrotal area. Exam under anesthesia was performed with condyloma seen involving the penis, medial thighs, left scrotum and perianal region. A large ulcer was noted in the left posterior anal area without extension into the anal canal. Biopsies revealed high-grade squamous intraepithelial neoplasia with no invasive cancer from the left scrotum. For skin biopsy revealed no invasive carcinoma. The left perianal biopsy confirmed invasive squamous cell carcinoma.  A PET scan was performed which revealed significant hypermetabolic activity in the left external iliac lymph node and hypermetabolic bilateral inguinal nodes. Activity in the anal region was seen on the left. Intense uptake was also noted in the distal penis. The patient was evaluated for possible chemoradiation treatment but requires treatment closer to home. Therefore I been asked to see the patient today for consideration of radiation treatment.   PREVIOUS RADIATION THERAPY: No   PAST MEDICAL HISTORY:  has a past medical history of Cancer.     PAST SURGICAL HISTORY:History reviewed. No pertinent past surgical history.   FAMILY HISTORY: family history is not on file.   SOCIAL HISTORY:  reports that he has been smoking Cigarettes.  He has been smoking about 0.30 packs per day. He does not have any smokeless tobacco history on file. He reports that he does not drink alcohol.   ALLERGIES: Review of patient's allergies indicates no known allergies.   MEDICATIONS:  Current Outpatient Prescriptions  Medication Sig Dispense Refill  . Multiple Vitamin (MULTIVITAMIN) tablet Take 1 tablet by mouth daily.      Marland Kitchen oxyCODONE-acetaminophen (PERCOCET/ROXICET)  5-325 MG per tablet Take 1 tablet by mouth every 4 (four) hours as needed for severe pain. 60 tablet 0   No current facility-administered  medications for this encounter.     REVIEW OF SYSTEMS:  A 15 point review of systems is documented in the electronic medical record. This was obtained by the nursing staff. However, I reviewed this with the patient to discuss relevant findings and make appropriate changes.  Pertinent items are noted in HPI.    PHYSICAL EXAM:  vitals were not taken for this visit.  ECOG = 1  0 - Asymptomatic (Fully active, able to carry on all predisease activities without restriction)  1 - Symptomatic but completely ambulatory (Restricted in physically strenuous activity but ambulatory and able to carry out work of a light or sedentary nature. For example, light housework, office work)  2 - Symptomatic, <50% in bed during the day (Ambulatory and capable of all self care but unable to carry out any work activities. Up and about more than 50% of waking hours)  3 - Symptomatic, >50% in bed, but not bedbound (Capable of only limited self-care, confined to bed or chair 50% or more of waking hours)  4 - Bedbound (Completely disabled. Cannot carry on any self-care. Totally confined to bed or chair)  5 - Death   Eustace Pen MM, Creech RH, Tormey DC, et al. [phone]). "Toxicity and response criteria of the [facility]". [place] Oncol. 5 (6): 649-55  General: Well-developed, in no acute distress HEENT: Normocephalic, atraumatic; oral cavity clear Neck: Supple without any lymphadenopathy Cardiovascular: Regular rate and rhythm Respiratory: Clear to auscultation bilaterally GI: Soft, nontender, normal bowel sounds Extremities: No edema present Neuro: No focal deficits Rectal:  Extensive condyloma present with skin breakdown in the left perianal region extending toward the anal verge. Nodular A Kenning is present. The patient has a very large area of involvement.    LABORATORY DATA:  Lab Results  Component Value Date   WBC 6.2 06/22/2010   HGB 12.3* 06/22/2010   HCT 37.0* 06/22/2010    MCV 83.4 06/22/2010   PLT 310 06/22/2010   Lab Results  Component Value Date   NA 136 06/15/2010   K 3.8 06/15/2010   CL 101 06/15/2010   CO2 25 06/15/2010   Lab Results  Component Value Date   ALT 20 06/15/2010   AST 14 06/15/2010   ALKPHOS 86 06/15/2010   BILITOT 0.1* 06/15/2010      RADIOGRAPHY: No results found.     IMPRESSION:  The patient has a diagnosis of invasive squamous cell carcinoma of the anus at this time with a history of significant condyloma in the anal and scrotal region dating back to 2012. PET scan recently at [facility] reveals hypermetabolic activity involving the anal region as well as bilateral hypermetabolic bilateral inguinal lymph nodes and left external iliac lymph node. In my opinion, the patient is appropriate for definitive chemoradiation treatment. He is also seeing medical oncology today.  I discussed with the patient a potential 6 week course of definitive radiation treatment. I discussed with him the potential side effects and risks of treatment as well as the rationale and possible benefit of treatment as well. All of his questions were answered.   PLAN: The patient will reschedule for a simulation in the near future such that we can proceed with treatment planning. I anticipate beginning the patient's treatment on 07/07/2014.    I spent  60 minutes face to face with the patient and more than 50% of that time was spent in counseling and/or coordination of care.    ________________________________   Jodelle Gross, MD, PhD   **Disclaimer: This note was dictated with voice recognition software. Similar sounding words can inadvertently be transcribed and this note may contain transcription errors which may not have been corrected upon publication of note.**

## 2014-06-26 ENCOUNTER — Encounter: Payer: Self-pay | Admitting: *Deleted

## 2014-06-26 ENCOUNTER — Other Ambulatory Visit (HOSPITAL_BASED_OUTPATIENT_CLINIC_OR_DEPARTMENT_OTHER): Payer: Medicare Other

## 2014-06-26 ENCOUNTER — Other Ambulatory Visit: Payer: Medicare Other

## 2014-06-26 DIAGNOSIS — C210 Malignant neoplasm of anus, unspecified: Secondary | ICD-10-CM | POA: Diagnosis present

## 2014-06-26 LAB — CBC WITH DIFFERENTIAL/PLATELET
BASO%: 0.2 % (ref 0.0–2.0)
Basophils Absolute: 0 10*3/uL (ref 0.0–0.1)
EOS%: 2.5 % (ref 0.0–7.0)
Eosinophils Absolute: 0.2 10*3/uL (ref 0.0–0.5)
HCT: 38.6 % (ref 38.4–49.9)
HGB: 12.7 g/dL — ABNORMAL LOW (ref 13.0–17.1)
LYMPH#: 2.5 10*3/uL (ref 0.9–3.3)
LYMPH%: 34 % (ref 14.0–49.0)
MCH: 27.2 pg (ref 27.2–33.4)
MCHC: 32.9 g/dL (ref 32.0–36.0)
MCV: 82.9 fL (ref 79.3–98.0)
MONO#: 0.5 10*3/uL (ref 0.1–0.9)
MONO%: 7.4 % (ref 0.0–14.0)
NEUT#: 4.1 10*3/uL (ref 1.5–6.5)
NEUT%: 55.9 % (ref 39.0–75.0)
Platelets: 302 10*3/uL (ref 140–400)
RBC: 4.66 10*6/uL (ref 4.20–5.82)
RDW: 13.6 % (ref 11.0–14.6)
WBC: 7.3 10*3/uL (ref 4.0–10.3)

## 2014-06-26 LAB — COMPREHENSIVE METABOLIC PANEL (CC13)
ALT: 14 U/L (ref 0–55)
ANION GAP: 7 meq/L (ref 3–11)
AST: 11 U/L (ref 5–34)
Albumin: 3.4 g/dL — ABNORMAL LOW (ref 3.5–5.0)
Alkaline Phosphatase: 77 U/L (ref 40–150)
BUN: 8.4 mg/dL (ref 7.0–26.0)
CALCIUM: 9 mg/dL (ref 8.4–10.4)
CHLORIDE: 109 meq/L (ref 98–109)
CO2: 26 meq/L (ref 22–29)
CREATININE: 0.8 mg/dL (ref 0.7–1.3)
Glucose: 125 mg/dl (ref 70–140)
POTASSIUM: 3.6 meq/L (ref 3.5–5.1)
SODIUM: 141 meq/L (ref 136–145)
Total Protein: 7.3 g/dL (ref 6.4–8.3)

## 2014-06-27 ENCOUNTER — Ambulatory Visit
Admission: RE | Admit: 2014-06-27 | Discharge: 2014-06-27 | Disposition: A | Discharge status: Home / Self Care | Payer: Medicare Other | Source: Ambulatory Visit | Attending: Radiation Oncology | Admitting: Radiation Oncology

## 2014-06-27 DIAGNOSIS — C210 Malignant neoplasm of anus, unspecified: Secondary | ICD-10-CM | POA: Insufficient documentation

## 2014-06-30 ENCOUNTER — Telehealth: Payer: Self-pay | Admitting: *Deleted

## 2014-06-30 NOTE — Telephone Encounter (Signed)
Pain well controlled. Has no questions or needs for RN

## 2014-07-01 ENCOUNTER — Ambulatory Visit: Payer: Medicare Other | Admitting: Oncology

## 2014-07-01 ENCOUNTER — Ambulatory Visit: Payer: Medicare Other

## 2014-07-03 ENCOUNTER — Other Ambulatory Visit: Payer: Medicaid Other

## 2014-07-03 ENCOUNTER — Ambulatory Visit: Payer: Medicaid Other | Admitting: Physician Assistant

## 2014-07-04 DIAGNOSIS — C210 Malignant neoplasm of anus, unspecified: Secondary | ICD-10-CM | POA: Diagnosis not present

## 2014-07-06 ENCOUNTER — Other Ambulatory Visit: Payer: Self-pay | Admitting: Oncology

## 2014-07-07 ENCOUNTER — Ambulatory Visit: Payer: Medicaid Other | Admitting: Nurse Practitioner

## 2014-07-07 ENCOUNTER — Ambulatory Visit
Admission: RE | Admit: 2014-07-07 | Discharge: 2014-07-07 | Disposition: A | Discharge status: Home / Self Care | Payer: Medicare Other | Source: Ambulatory Visit | Attending: Radiation Oncology | Admitting: Radiation Oncology

## 2014-07-07 ENCOUNTER — Telehealth: Payer: Self-pay | Admitting: *Deleted

## 2014-07-07 ENCOUNTER — Ambulatory Visit (HOSPITAL_COMMUNITY)
Admission: RE | Admit: 2014-07-07 | Discharge: 2014-07-07 | Disposition: A | Discharge status: Home / Self Care | Payer: Medicare Other | Source: Ambulatory Visit | Attending: Oncology | Admitting: Oncology

## 2014-07-07 ENCOUNTER — Encounter: Payer: Medicaid Other | Admitting: Nutrition

## 2014-07-07 ENCOUNTER — Ambulatory Visit: Payer: Medicaid Other

## 2014-07-07 ENCOUNTER — Other Ambulatory Visit: Payer: Medicaid Other

## 2014-07-07 DIAGNOSIS — C210 Malignant neoplasm of anus, unspecified: Secondary | ICD-10-CM | POA: Diagnosis not present

## 2014-07-07 NOTE — Progress Notes (Signed)
   Department of Radiation Oncology  Phone:  [phone] Fax:        [phone]  Weekly Treatment Note    Name: Dylan [name] Date: 07/07/2014 MRN: [id number] DOB: Dec 05, 1970   Current dose: 1.8 Gy  Current fraction:1   MEDICATIONS: Current Outpatient Prescriptions  Medication Sig Dispense Refill  . Multiple Vitamin (MULTIVITAMIN) tablet Take 1 tablet by mouth daily.      Marland Kitchen oxyCODONE-acetaminophen (PERCOCET/ROXICET) 5-325 MG per tablet Take 1 tablet by mouth every 4 (four) hours as needed for severe pain. 60 tablet 0   No current facility-administered medications for this encounter.     ALLERGIES: Review of patient's allergies indicates no known allergies.   LABORATORY DATA:  Lab Results  Component Value Date   WBC 7.3 06/26/2014   HGB 12.7* 06/26/2014   HCT 38.6 06/26/2014   MCV 82.9 06/26/2014   PLT 302 06/26/2014   Lab Results  Component Value Date   NA 141 06/26/2014   K 3.6 06/26/2014   CL 101 06/15/2010   CO2 26 06/26/2014   Lab Results  Component Value Date   ALT 14 06/26/2014   AST 11 06/26/2014   ALKPHOS 77 06/26/2014   BILITOT <0.20 06/26/2014     NARRATIVE: Dylan [name] was seen today for weekly treatment management. The chart was checked and the patient's films were reviewed.  Patient wanting to discontinue chemotherapy due to side effects.  This was discussed in detail including the benefit of chemotherapy in conjunction with radiation treatment.  PHYSICAL EXAMINATION: vitals were not taken for this visit.  alert, in no acute distress  ASSESSMENT: The patient is doing satisfactorily with treatment. No difficulties with his first treatment.   PLAN: We will continue with the patient's radiation treatment as planned. The patient is steadfast in his refusal to proceed with chemotherapy. Again, we discussed this in detail including the positives of chemotherapy in this setting and in conjunction with radiation treatment. The patient states  that he only will proceed with radiation treatment.   This document serves as a record of services personally performed by Kyung Rudd, MD. It was created on his behalf by Derek Mound, a trained medical scribe. The creation of this record is based on the scribe's personal observations and the provider's statements to them. This document has been checked and approved by the attending provider.

## 2014-07-07 NOTE — Telephone Encounter (Signed)
Patient had presented to office to cancel his office visit and chemo appointment. He did not have his PICC line placed today. When this nurse was able to go to lobby to talk with him, he had left. Called him at home and he confirmed he does not want chemo-only wants the radiation. Informed him that the chemo is a radiosensitizer and makes the RT work more effectively. He agrees to speak with this RN today after he sees Dr. Lisbeth Renshaw. Called and left VM for Thayer Headings, RN to communicate to Dr. Lisbeth Renshaw the conversation with Rito Ehrlich.

## 2014-07-08 ENCOUNTER — Telehealth: Payer: Self-pay | Admitting: Oncology

## 2014-07-08 ENCOUNTER — Telehealth: Payer: Self-pay | Admitting: *Deleted

## 2014-07-08 ENCOUNTER — Ambulatory Visit
Admission: RE | Admit: 2014-07-08 | Discharge: 2014-07-08 | Disposition: A | Discharge status: Home / Self Care | Payer: Medicare Other | Source: Ambulatory Visit | Attending: Radiation Oncology | Admitting: Radiation Oncology

## 2014-07-08 DIAGNOSIS — C210 Malignant neoplasm of anus, unspecified: Secondary | ICD-10-CM

## 2014-07-08 NOTE — Progress Notes (Signed)
Patient education done, radiation therapy and you book, my business card, sitz bath given to patient,discussed ways to manage  Side effects/symptoms, buy imodium ad OTC for prn diarrhea, fatigue, skin irritation,pain, diarrhea, bladder changes,hair loss in treated area only, increase protein in diet,( low fiber diet,no fresh vegetable,fresh fruit  when having diarrhea ,) drink plenty water,stay hydrated, discussed to use sitz bath with warm water only, prn,, use baby wipes no alcohol,and  spray bottle to rinse bottom once irritated, sees MD weekly and prn,  Patient gave verbal understanding, teach back given 11:07 AM

## 2014-07-08 NOTE — Telephone Encounter (Signed)
Oncology Nurse Navigator Documentation  Oncology Nurse Navigator Flowsheets 06/30/2014 07/08/2014  Referral date to RadOnc/MedOnc [id number] -  Navigator Encounter Type Other Treatment-2nd RT--gave patient his radiology CD back from [facility]  Patient Visit Type Follow-up Radonc  Treatment Phase Other RT # 2  Barriers/Navigation Needs No barriers at this time Family concerns--fearful of side effects from chemotherapy and declines chemo at this time. Stressed how important it is in conjunction with RT and he still declines. He is aware that RT will have its one side effects.  Interventions None required Encouraged him to see Dr. Benay Spice in couple weeks to ensure he is doing well and pain is under control. We want the best for him and need to stay in the loop to assist with symptom management. He agrees to see Dr. Benay Spice in future. Provided my contact information and encourage him to call at any time.  Time Spent with Patient (No Data) 10

## 2014-07-08 NOTE — Telephone Encounter (Signed)
s.w. pt and advised on JUNE appt.....pt ok and aware °

## 2014-07-09 ENCOUNTER — Ambulatory Visit
Admission: RE | Admit: 2014-07-09 | Discharge: 2014-07-09 | Disposition: A | Discharge status: Home / Self Care | Payer: Medicare Other | Source: Ambulatory Visit | Attending: Radiation Oncology | Admitting: Radiation Oncology

## 2014-07-09 DIAGNOSIS — C210 Malignant neoplasm of anus, unspecified: Secondary | ICD-10-CM | POA: Diagnosis not present

## 2014-07-10 ENCOUNTER — Ambulatory Visit
Admission: RE | Admit: 2014-07-10 | Discharge: 2014-07-10 | Disposition: A | Discharge status: Home / Self Care | Payer: Medicare Other | Source: Ambulatory Visit | Attending: Radiation Oncology | Admitting: Radiation Oncology

## 2014-07-10 DIAGNOSIS — C210 Malignant neoplasm of anus, unspecified: Secondary | ICD-10-CM | POA: Diagnosis not present

## 2014-07-11 ENCOUNTER — Ambulatory Visit
Admission: RE | Admit: 2014-07-11 | Discharge: 2014-07-11 | Disposition: A | Discharge status: Home / Self Care | Payer: Medicare Other | Source: Ambulatory Visit | Attending: Radiation Oncology | Admitting: Radiation Oncology

## 2014-07-11 ENCOUNTER — Ambulatory Visit: Payer: Medicare Other | Admitting: Oncology

## 2014-07-11 ENCOUNTER — Ambulatory Visit: Payer: Medicare Other

## 2014-07-11 DIAGNOSIS — C210 Malignant neoplasm of anus, unspecified: Secondary | ICD-10-CM | POA: Diagnosis not present

## 2014-07-15 ENCOUNTER — Ambulatory Visit
Admission: RE | Admit: 2014-07-15 | Discharge: 2014-07-15 | Disposition: A | Discharge status: Home / Self Care | Payer: Medicare Other | Source: Ambulatory Visit | Attending: Radiation Oncology | Admitting: Radiation Oncology

## 2014-07-15 DIAGNOSIS — C210 Malignant neoplasm of anus, unspecified: Secondary | ICD-10-CM | POA: Diagnosis not present

## 2014-07-16 ENCOUNTER — Ambulatory Visit
Admission: RE | Admit: 2014-07-16 | Discharge: 2014-07-16 | Disposition: A | Discharge status: Home / Self Care | Payer: Medicare Other | Source: Ambulatory Visit | Attending: Radiation Oncology | Admitting: Radiation Oncology

## 2014-07-16 DIAGNOSIS — C210 Malignant neoplasm of anus, unspecified: Secondary | ICD-10-CM | POA: Diagnosis not present

## 2014-07-17 ENCOUNTER — Encounter: Payer: Self-pay | Admitting: *Deleted

## 2014-07-17 ENCOUNTER — Ambulatory Visit
Admission: RE | Admit: 2014-07-17 | Discharge: 2014-07-17 | Disposition: A | Discharge status: Home / Self Care | Payer: Medicare Other | Source: Ambulatory Visit | Attending: Radiation Oncology | Admitting: Radiation Oncology

## 2014-07-17 DIAGNOSIS — C210 Malignant neoplasm of anus, unspecified: Secondary | ICD-10-CM | POA: Diagnosis not present

## 2014-07-17 NOTE — [facility] Oncology Navigator Note (Signed)
Oncology Nurse Navigator Documentation  Oncology Nurse Navigator Flowsheets 06/30/2014 07/08/2014 07/17/2014  Referral date to RadOnc/MedOnc 06/04/2014 - -  Navigator Encounter Type Other Treatment Treatment  Patient Visit Type Follow-up Radonc Radonc  Treatment Phase Other Other Treatment-#9  Barriers/Navigation Needs No barriers at this time Family concerns No barriers at this time except for pain-worse at night. Diarrhea under control and eating well.  Interventions None required Other Will get pain med script from MD to pick up tomorrow  Time Spent with Patient (No Data) 15 5

## 2014-07-18 ENCOUNTER — Ambulatory Visit
Admission: RE | Admit: 2014-07-18 | Discharge: 2014-07-18 | Disposition: A | Discharge status: Home / Self Care | Payer: Medicare Other | Source: Ambulatory Visit | Attending: Radiation Oncology | Admitting: Radiation Oncology

## 2014-07-18 VITALS — BP 116/80 | HR 87 | Temp 97.8°F | Wt 195.7 lb

## 2014-07-18 DIAGNOSIS — C210 Malignant neoplasm of anus, unspecified: Secondary | ICD-10-CM

## 2014-07-18 NOTE — Progress Notes (Signed)
Weekly assessment of radiation to pelvis/rectum.Pain level "7'.Given refill prescription for percocet today.Also given abd pads to help with drainage.Patient did ask what he can apply to open area, defer to Dr.Moody for suggestions.

## 2014-07-18 NOTE — Addendum Note (Signed)
Encounter addended by: Kyung Rudd, MD on: 07/18/2014 12:22 PM<BR>     Documentation filed: Notes Section

## 2014-07-18 NOTE — Progress Notes (Addendum)
   Department of Radiation Oncology  Phone:  [phone] Fax:        [phone]  Weekly Treatment Note    Name: Dylan [name] Date: 07/18/2014 MRN: [id number] DOB: [birth year]   Current dose: 16.2 Gy  Current fraction: 9   MEDICATIONS: Current Outpatient Prescriptions  Medication Sig Dispense Refill  . Multiple Vitamin (MULTIVITAMIN) tablet Take 1 tablet by mouth daily.      Marland Kitchen oxyCODONE-acetaminophen (PERCOCET/ROXICET) 5-325 MG per tablet Take 1 tablet by mouth every 4 (four) hours as needed for severe pain. 60 tablet 0   No current facility-administered medications for this encounter.     ALLERGIES: Review of patient's allergies indicates no known allergies.   LABORATORY DATA:  Lab Results  Component Value Date   WBC 7.3 06/26/2014   HGB 12.7* 06/26/2014   HCT 38.6 06/26/2014   MCV 82.9 06/26/2014   PLT 302 06/26/2014   Lab Results  Component Value Date   NA 141 06/26/2014   K 3.6 06/26/2014   CL 101 06/15/2010   CO2 26 06/26/2014   Lab Results  Component Value Date   ALT 14 06/26/2014   AST 11 06/26/2014   ALKPHOS 77 06/26/2014   BILITOT <0.20 06/26/2014     NARRATIVE: Dylan [name] was seen today for weekly treatment management. The chart was checked and the patient's films were reviewed.  Weekly assessment of radiation to pelvis/rectum.Pain level "7'.Given refill prescription for percocet today.Also given abd pads to help with drainage.Patient did ask what he can apply to open area, defer to Dr.Latifa Noble for suggestions.  PHYSICAL EXAMINATION: weight is 195 lb 11.2 oz (88.769 kg). His temperature is 97.8 F (36.6 C). His blood pressure is 116/80 and his pulse is 87. His oxygen saturation is 99%.        ASSESSMENT: The patient is doing satisfactorily with treatment.  PLAN: We will continue with the patient's radiation treatment as planned. The patient is having some increased drainage at this time. We will continue to follow this. I do not recommend any  changes or putting anything on the treatment area currently.

## 2014-07-21 ENCOUNTER — Ambulatory Visit
Admission: RE | Admit: 2014-07-21 | Discharge: 2014-07-21 | Disposition: A | Discharge status: Home / Self Care | Payer: Medicare Other | Source: Ambulatory Visit | Attending: Radiation Oncology | Admitting: Radiation Oncology

## 2014-07-21 DIAGNOSIS — C210 Malignant neoplasm of anus, unspecified: Secondary | ICD-10-CM | POA: Diagnosis not present

## 2014-07-22 ENCOUNTER — Ambulatory Visit: Payer: Medicare Other

## 2014-07-22 ENCOUNTER — Ambulatory Visit: Admission: RE | Admit: 2014-07-22 | Payer: Medicare Other | Source: Ambulatory Visit

## 2014-07-22 ENCOUNTER — Telehealth: Payer: Self-pay | Admitting: *Deleted

## 2014-07-22 NOTE — Telephone Encounter (Signed)
Called patient at home, he hasn't gotten any imodium as yet  Stated his daughter was going to get him some today, he will take 2 tablets when she gets them for him, cancelling todays tx will let Linac know 2:46 PM

## 2014-07-22 NOTE — Telephone Encounter (Signed)
I called patient after hearing patient had called and stated he wasn't feeling well, having diarrhea and bleeding from his penis again, his diarrhea started yesterday, and he had started having some  Scant blood  from his penis, has happened before stated,  Asked if he had a fever, NO< I'm also eating well, asked if he had imodium at home, no and asked if either he or someone could go to [facility] and get imodium today and start taking as directions , "He will go himself and get  Today , and if he feels better he will be in today For his radiation treatment, asked him to call either way if coming in today or not, he stated he would 9:26 AM

## 2014-07-23 ENCOUNTER — Ambulatory Visit
Admission: RE | Admit: 2014-07-23 | Discharge: 2014-07-23 | Disposition: A | Discharge status: Home / Self Care | Payer: Medicare Other | Source: Ambulatory Visit | Attending: Radiation Oncology | Admitting: Radiation Oncology

## 2014-07-23 DIAGNOSIS — C210 Malignant neoplasm of anus, unspecified: Secondary | ICD-10-CM | POA: Diagnosis not present

## 2014-07-24 ENCOUNTER — Ambulatory Visit
Admission: RE | Admit: 2014-07-24 | Discharge: 2014-07-24 | Disposition: A | Discharge status: Home / Self Care | Payer: Medicare Other | Source: Ambulatory Visit | Attending: Radiation Oncology | Admitting: Radiation Oncology

## 2014-07-24 ENCOUNTER — Encounter: Payer: Self-pay | Admitting: Radiation Oncology

## 2014-07-24 VITALS — BP 123/79 | HR 91 | Temp 98.0°F | Resp 20

## 2014-07-24 DIAGNOSIS — C210 Malignant neoplasm of anus, unspecified: Secondary | ICD-10-CM

## 2014-07-24 MED ORDER — OXYCODONE-ACETAMINOPHEN 5-325 MG PO TABS: 1.0000 | ORAL_TABLET | ORAL | Status: DC | PRN

## 2014-07-24 NOTE — Progress Notes (Signed)
  Radiation Oncology         [phone]   Name: Dylan [name] MRN: [id number]   Date: 07/24/2014  DOB: 05/25/1970   Weekly Radiation Therapy Management    ICD-9-CM ICD-10-CM   1. Anal cancer 154.3 C21.0 oxyCODONE-acetaminophen (PERCOCET/ROXICET) 5-325 MG per tablet     DISCONTINUED: oxyCODONE-acetaminophen (PERCOCET/ROXICET) 5-325 MG per tablet    Current Dose: 21.6 Gy  Planned Dose:  54 Gy  Narrative The patient presents for routine under treatment assessment. Weekly rad txs pelvis, still has scrotal swelling, had bleeding from penis the other day trickled stated none now, diarrhea this am took immodium, pain 8/10 scale, rectal apin, had rx percocet 5/325mg  written 07/18/14  by Dr. Lisbeth Renshaw. Patient stated, "He left bottle pain medication in sisters car and she is out of town for 2 weeks". Taking tylenol says helps a little, appetite good, no nausea,  Stated no chemo still very [place].    Set-up films were reviewed.  The chart was checked.  Physical Findings  oral temperature is 98 F (36.7 C). His blood pressure is 123/79 and his pulse is 91. His respiration is 20. . Weight essentially stable.  No significant changes.  Impression The patient is tolerating radiation.  Plan Continue treatment as planned. Refilled pain medication.     This document serves as a record of services personally performed by [name], MD. It was created on his behalf by Jeralene Peters, a trained medical scribe. The creation of this record is based on the scribe's personal observations and the provider's statements to them. This document has been checked and approved by the attending provider.       Sheral Apley Tammi Klippel, M.D.

## 2014-07-24 NOTE — Progress Notes (Addendum)
Weekly rad txs pelvis, still has scrotal swelling, hadbleeding from penis the other day trickled stated, none now, diarrhea this am took imodium, pain 8/10 scale, rectal apin, had rx percocet 5/325mg  written 07/18/14  By Dr. Edilia Bo stated he left bottle pain medication in sisters car and she is out of town for 2 weeks, taking tylenol says helps a little, appetite good, no nausea,  Stated no chemo still very [place] a voice message on her phone There were no vitals taken for this visit.  Wt Readings from Last 3 Encounters:  07/18/14 195 lb 11.2 oz (88.769 kg)  06/20/14 194 lb 1.6 oz (88.043 kg)     11:35 AM

## 2014-07-25 ENCOUNTER — Ambulatory Visit
Admission: RE | Admit: 2014-07-25 | Discharge: 2014-07-25 | Disposition: A | Discharge status: Home / Self Care | Payer: Medicare Other | Source: Ambulatory Visit | Attending: Radiation Oncology | Admitting: Radiation Oncology

## 2014-07-25 DIAGNOSIS — C210 Malignant neoplasm of anus, unspecified: Secondary | ICD-10-CM | POA: Diagnosis not present

## 2014-07-27 ENCOUNTER — Other Ambulatory Visit: Payer: Self-pay | Admitting: Oncology

## 2014-07-28 ENCOUNTER — Ambulatory Visit
Admission: RE | Admit: 2014-07-28 | Discharge: 2014-07-28 | Disposition: A | Discharge status: Home / Self Care | Payer: Medicare Other | Source: Ambulatory Visit | Attending: Radiation Oncology | Admitting: Radiation Oncology

## 2014-07-28 DIAGNOSIS — C210 Malignant neoplasm of anus, unspecified: Secondary | ICD-10-CM | POA: Diagnosis not present

## 2014-07-29 ENCOUNTER — Other Ambulatory Visit: Payer: Self-pay | Admitting: *Deleted

## 2014-07-29 ENCOUNTER — Telehealth: Payer: Self-pay | Admitting: Oncology

## 2014-07-29 ENCOUNTER — Ambulatory Visit: Payer: Medicare Other | Admitting: Nurse Practitioner

## 2014-07-29 ENCOUNTER — Ambulatory Visit
Admission: RE | Admit: 2014-07-29 | Discharge: 2014-07-29 | Disposition: A | Discharge status: Home / Self Care | Payer: Medicare Other | Source: Ambulatory Visit | Attending: Radiation Oncology | Admitting: Radiation Oncology

## 2014-07-29 ENCOUNTER — Encounter: Payer: Self-pay | Admitting: *Deleted

## 2014-07-29 ENCOUNTER — Other Ambulatory Visit: Payer: Medicare Other

## 2014-07-29 DIAGNOSIS — C210 Malignant neoplasm of anus, unspecified: Secondary | ICD-10-CM | POA: Diagnosis not present

## 2014-07-29 NOTE — Telephone Encounter (Signed)
Called and spoke with rad.onc and she will have the patient stop by and get a new schedule as his voicemail is full

## 2014-07-29 NOTE — [facility] Oncology Navigator Note (Signed)
Oncology Nurse Navigator Documentation  Oncology Nurse Navigator Flowsheets 07/29/2014  Referral date to RadOnc/MedOnc -  Navigator Encounter Type Treatment-F/U on FTKA today  Patient Visit Type Radonc  Treatment Phase Treatment  Barriers/Navigation Needs No barriers at this time--reports "i'm fine". Made him aware it is important to stay connected with Dr. Benay Spice, even if he is not getting chemo. If he had to be hospitalized, Dr. Benay Spice would be taking care of him and not radiation oncology.  Interventions Provided with appointment to see Dr. Benay Spice on 6/17 before RT. He agrees to appointment  Time Spent with Patient 5

## 2014-07-30 ENCOUNTER — Telehealth: Payer: Self-pay | Admitting: Oncology

## 2014-07-30 ENCOUNTER — Other Ambulatory Visit: Payer: Self-pay | Admitting: *Deleted

## 2014-07-30 ENCOUNTER — Ambulatory Visit
Admission: RE | Admit: 2014-07-30 | Discharge: 2014-07-30 | Disposition: A | Discharge status: Home / Self Care | Payer: Medicare Other | Source: Ambulatory Visit | Attending: Radiation Oncology | Admitting: Radiation Oncology

## 2014-07-30 ENCOUNTER — Other Ambulatory Visit: Payer: Medicare Other

## 2014-07-30 ENCOUNTER — Encounter: Payer: Self-pay | Admitting: Radiation Oncology

## 2014-07-30 ENCOUNTER — Ambulatory Visit: Payer: Medicare Other | Admitting: Nurse Practitioner

## 2014-07-30 VITALS — BP 119/83 | HR 110 | Temp 98.5°F | Resp 12 | Wt 190.7 lb

## 2014-07-30 DIAGNOSIS — C210 Malignant neoplasm of anus, unspecified: Secondary | ICD-10-CM

## 2014-07-30 MED ORDER — OXYCODONE HCL ER 15 MG PO T12A: 15.0000 mg | EXTENDED_RELEASE_TABLET | Freq: Two times a day (BID) | ORAL | Status: DC

## 2014-07-30 NOTE — Progress Notes (Signed)
   Department of Radiation Oncology  Phone:  [phone] Fax:        [phone]  Weekly Treatment Note    Name: Dylan [name] Date: 07/30/2014 MRN: [id number] DOB: [birth year]   Current dose: 28.8 Gy  Current fraction: 16   MEDICATIONS: Current Outpatient Prescriptions  Medication Sig Dispense Refill  . loperamide (IMODIUM A-D) 2 MG tablet Take 2 mg by mouth 4 (four) times daily as needed for diarrhea or loose stools.    . Multiple Vitamin (MULTIVITAMIN) tablet Take 1 tablet by mouth daily.      . OxyCODONE (OXYCONTIN) 15 mg T12A 12 hr tablet Take 1 tablet (15 mg total) by mouth every 12 (twelve) hours. 60 tablet 0  . oxyCODONE-acetaminophen (PERCOCET/ROXICET) 5-325 MG per tablet Take 1 tablet by mouth every 4 (four) hours as needed for severe pain. 60 tablet 0   No current facility-administered medications for this encounter.     ALLERGIES: Review of patient's allergies indicates no known allergies.   LABORATORY DATA:  Lab Results  Component Value Date   WBC 7.3 06/26/2014   HGB 12.7* 06/26/2014   HCT 38.6 06/26/2014   MCV 82.9 06/26/2014   PLT 302 06/26/2014   Lab Results  Component Value Date   NA 141 06/26/2014   K 3.6 06/26/2014   CL 101 06/15/2010   CO2 26 06/26/2014   Lab Results  Component Value Date   ALT 14 06/26/2014   AST 11 06/26/2014   ALKPHOS 77 06/26/2014   BILITOT <0.20 06/26/2014     NARRATIVE: Zyion L Stofer was seen today for weekly treatment management. The chart was checked and the patient's films were reviewed.  PAIN: He rates his pain as a 8 on a scale of 0-10. constant, sharp, burning and stabbing over Anal area, legs . BOWEL: Diarrhea  2 and Rectal Bleeding, Severe rectal pain with bowel movements, rectal bleeding and itching. Imodium every other day.  SKIN:  Pt reports skin is "raw." Continues to do sitz baths OTHER: Pt complains of loss of sleep. Reports diet and appetite is good and drinking plenty of  water. WEIGHT/VS: Wt Readings from Last 3 Encounters:  07/30/14 190 lb 11.2 oz (86.501 kg)  07/18/14 195 lb 11.2 oz (88.769 kg)  06/20/14 194 lb 1.6 oz (88.043 kg)   BP 119/83 mmHg  Pulse 110  Temp(Src) 98.5 F (36.9 C) (Oral)  Resp 12  Wt 190 lb 11.2 oz (86.501 kg)  SpO2 100% Orthostatic Standing Vital Signs: BP:119/83 P:110 Pox:100%    PHYSICAL EXAMINATION: weight is 190 lb 11.2 oz (86.501 kg). His oral temperature is 98.5 F (36.9 C). His blood pressure is 119/83 and his pulse is 110. His respiration is 12 and oxygen saturation is 100%.        ASSESSMENT: The patient is doing satisfactorily with treatment.  PLAN: We will continue with the patient's radiation treatment as planned. The patient is having some increase in pain which is more constant. He was given a long acting medicine in the form of OxyContin and he will continue to take a short acting pain medicine as needed.

## 2014-07-30 NOTE — Progress Notes (Signed)
PAIN: He rates his pain as a 8 on a scale of 0-10. constant, sharp, burning and stabbing over Anal area, legs . BOWEL: Diarrhea  2 and Rectal Bleeding, Severe rectal pain with bowel movements, rectal bleeding and itching. Imodium every other day.  SKIN:  Pt reports skin is "raw." Continues to do sitz baths OTHER: Pt complains of loss of sleep. Reports diet and appetite is good and drinking plenty of water. WEIGHT/VS: Wt Readings from Last 3 Encounters:  07/30/14 190 lb 11.2 oz (86.501 kg)  07/18/14 195 lb 11.2 oz (88.769 kg)  06/20/14 194 lb 1.6 oz (88.043 kg)   BP 119/83 mmHg  Pulse 110  Temp(Src) 98.5 F (36.9 C) (Oral)  Resp 12  Wt 190 lb 11.2 oz (86.501 kg)  SpO2 100% Orthostatic Standing Vital Signs: BP:119/83 P:110 Pox:100%

## 2014-07-30 NOTE — Telephone Encounter (Signed)
Called patient and he is aware of his lab on 6/17

## 2014-07-31 ENCOUNTER — Ambulatory Visit
Admission: RE | Admit: 2014-07-31 | Discharge: 2014-07-31 | Disposition: A | Discharge status: Home / Self Care | Payer: Medicare Other | Source: Ambulatory Visit | Attending: Radiation Oncology | Admitting: Radiation Oncology

## 2014-07-31 DIAGNOSIS — C210 Malignant neoplasm of anus, unspecified: Secondary | ICD-10-CM | POA: Diagnosis not present

## 2014-08-01 ENCOUNTER — Ambulatory Visit: Payer: Medicare Other | Admitting: Oncology

## 2014-08-01 ENCOUNTER — Ambulatory Visit
Admission: RE | Admit: 2014-08-01 | Discharge: 2014-08-01 | Disposition: A | Discharge status: Home / Self Care | Payer: Medicare Other | Source: Ambulatory Visit | Attending: Radiation Oncology | Admitting: Radiation Oncology

## 2014-08-01 ENCOUNTER — Telehealth: Payer: Self-pay | Admitting: Oncology

## 2014-08-01 ENCOUNTER — Other Ambulatory Visit: Payer: Medicare Other

## 2014-08-01 DIAGNOSIS — C210 Malignant neoplasm of anus, unspecified: Secondary | ICD-10-CM | POA: Diagnosis not present

## 2014-08-01 NOTE — Telephone Encounter (Signed)
Scheduled pt for labs/ov, pt was late 06/17 scheduled on 06/20 right before his radiation.... Will have scheduler to call pt confirming the D/T that was working with him.... KJ

## 2014-08-01 NOTE — Telephone Encounter (Signed)
r/s [pt appt-cld & spoke to pt and gave pt time & date of appt for 6/20-pt understood

## 2014-08-04 ENCOUNTER — Other Ambulatory Visit: Payer: Medicare Other

## 2014-08-04 ENCOUNTER — Telehealth: Payer: Self-pay | Admitting: *Deleted

## 2014-08-04 ENCOUNTER — Ambulatory Visit: Payer: Medicare Other | Admitting: Oncology

## 2014-08-04 ENCOUNTER — Ambulatory Visit
Admission: RE | Admit: 2014-08-04 | Discharge: 2014-08-04 | Disposition: A | Discharge status: Home / Self Care | Payer: Medicare Other | Source: Ambulatory Visit | Attending: Radiation Oncology | Admitting: Radiation Oncology

## 2014-08-04 DIAGNOSIS — C210 Malignant neoplasm of anus, unspecified: Secondary | ICD-10-CM | POA: Diagnosis not present

## 2014-08-04 NOTE — Telephone Encounter (Signed)
Attempted to reach pt regarding missed appts, no answer and VM not set up.

## 2014-08-05 ENCOUNTER — Telehealth: Payer: Self-pay | Admitting: *Deleted

## 2014-08-05 ENCOUNTER — Ambulatory Visit: Admission: RE | Admit: 2014-08-05 | Payer: Medicare Other | Source: Ambulatory Visit

## 2014-08-05 ENCOUNTER — Ambulatory Visit
Admission: RE | Admit: 2014-08-05 | Discharge: 2014-08-05 | Disposition: A | Discharge status: Home / Self Care | Payer: Medicare Other | Source: Ambulatory Visit | Attending: Radiation Oncology | Admitting: Radiation Oncology

## 2014-08-05 NOTE — Telephone Encounter (Signed)
Called Dylan [name] back and told him to bring proof of income to financial counselor in radiation oncology to apply for gas card.

## 2014-08-05 NOTE — Telephone Encounter (Signed)
Oncology Nurse Navigator Documentation  Oncology Nurse Navigator Flowsheets 08/05/2014  Referral date to RadOnc/MedOnc -  Navigator Encounter Type Telephone follow up call-missed his appointment 6/20  Patient Visit Type -  Treatment Phase Treatment-Radiation therapy-cancelled today  Barriers/Navigation Needs Financial;Family concerns--ran out of gas yesterday, reports his bottom hurts and painful to wear pants and have bowel movement; "nothing helps". Doing sitz bath,pain meds and Aquaphor lotion.  Interventions Referrals to managed care re: gas card eligibility and will relay symptoms to radiation oncology nurse.  Coordination of Care MD Appointments-rescheduled his appointment with NP for 08/13/14 at 12:15.  Time Spent with Patient 20

## 2014-08-06 ENCOUNTER — Ambulatory Visit
Admission: RE | Admit: 2014-08-06 | Discharge: 2014-08-06 | Disposition: A | Discharge status: Home / Self Care | Payer: Medicare Other | Source: Ambulatory Visit | Attending: Radiation Oncology | Admitting: Radiation Oncology

## 2014-08-06 DIAGNOSIS — C210 Malignant neoplasm of anus, unspecified: Secondary | ICD-10-CM | POA: Diagnosis not present

## 2014-08-07 ENCOUNTER — Ambulatory Visit
Admission: RE | Admit: 2014-08-07 | Discharge: 2014-08-07 | Disposition: A | Discharge status: Home / Self Care | Payer: Medicare Other | Source: Ambulatory Visit | Attending: Radiation Oncology | Admitting: Radiation Oncology

## 2014-08-07 DIAGNOSIS — C210 Malignant neoplasm of anus, unspecified: Secondary | ICD-10-CM | POA: Diagnosis not present

## 2014-08-08 ENCOUNTER — Encounter: Payer: Self-pay | Admitting: Radiation Oncology

## 2014-08-08 ENCOUNTER — Ambulatory Visit
Admission: RE | Admit: 2014-08-08 | Discharge: 2014-08-08 | Disposition: A | Discharge status: Home / Self Care | Payer: Medicare Other | Source: Ambulatory Visit | Attending: Radiation Oncology | Admitting: Radiation Oncology

## 2014-08-08 VITALS — BP 118/79 | HR 100 | Temp 98.3°F | Resp 20 | Wt 190.2 lb

## 2014-08-08 DIAGNOSIS — C210 Malignant neoplasm of anus, unspecified: Secondary | ICD-10-CM | POA: Diagnosis not present

## 2014-08-08 MED ORDER — OXYCODONE-ACETAMINOPHEN 5-325 MG PO TABS: 1.0000 | ORAL_TABLET | ORAL | Status: DC | PRN

## 2014-08-08 NOTE — Progress Notes (Signed)
   Department of Radiation Oncology  Phone:  [phone] Fax:        [phone]  Weekly Treatment Note    Name: Dylan [name] Date: 08/08/2014 MRN: [id number] DOB: 13-Jun-1970   Current dose: 39.6 Gy  Current fraction: 22   MEDICATIONS: Current Outpatient Prescriptions  Medication Sig Dispense Refill  . loperamide (IMODIUM A-D) 2 MG tablet Take 2 mg by mouth 4 (four) times daily as needed for diarrhea or loose stools.    . Multiple Vitamin (MULTIVITAMIN) tablet Take 1 tablet by mouth daily.      . OxyCODONE (OXYCONTIN) 15 mg T12A 12 hr tablet Take 1 tablet (15 mg total) by mouth every 12 (twelve) hours. 60 tablet 0  . oxyCODONE-acetaminophen (PERCOCET/ROXICET) 5-325 MG per tablet Take 1 tablet by mouth every 4 (four) hours as needed for severe pain. 60 tablet 0   No current facility-administered medications for this encounter.     ALLERGIES: Review of patient's allergies indicates no known allergies.   LABORATORY DATA:  Lab Results  Component Value Date   WBC 7.3 06/26/2014   HGB 12.7* 06/26/2014   HCT 38.6 06/26/2014   MCV 82.9 06/26/2014   PLT 302 06/26/2014   Lab Results  Component Value Date   NA 141 06/26/2014   K 3.6 06/26/2014   CL 101 06/15/2010   CO2 26 06/26/2014   Lab Results  Component Value Date   ALT 14 06/26/2014   AST 11 06/26/2014   ALKPHOS 77 06/26/2014   BILITOT <0.20 06/26/2014     NARRATIVE: Hanson L Rolston was seen today for weekly treatment management. The chart was checked and the patient's films were reviewed.  Weekly rad txs rectal 22/30 completed, says rectum broken skin, irritated,uses aquaphor  after radiation  7/10 pain, needs refill pn pain medication has 1 day left, using baby wipes   No nausea, , appetite okay, getting fatigued, uses sitz bath prn 12:15 PM BP 118/79 mmHg  Pulse 100  Temp(Src) 98.3 F (36.8 C) (Oral)  Resp 20  Wt 190 lb 3.2 oz (86.274 kg)  Wt Readings from Last 3 Encounters:  08/08/14 190 lb 3.2 oz  (86.274 kg)  07/30/14 190 lb 11.2 oz (86.501 kg)  07/18/14 195 lb 11.2 oz (88.769 kg)    PHYSICAL EXAMINATION: weight is 190 lb 3.2 oz (86.274 kg). His oral temperature is 98.3 F (36.8 C). His blood pressure is 118/79 and his pulse is 100. His respiration is 20.      the patient has some irritation in the treatment area as expected. No major areas of moist desquamation currently beyond the initial presentation.  ASSESSMENT: The patient is doing satisfactorily with treatment.  PLAN: We will continue with the patient's radiation treatment as planned.

## 2014-08-08 NOTE — Progress Notes (Signed)
Weekly rad txs rectal 22/30 completed, says rectum broken skin, irritated,uses aquaphor  after radiation  7/10 pain, needs refill pn pain medication has 1 day left, using baby wipes   No nausea, , appetite okay, getting fatigued, uses sitz bath prn 11:17 AM BP 118/79 mmHg  Pulse 100  Temp(Src) 98.3 F (36.8 C) (Oral)  Resp 20  Wt 190 lb 3.2 oz (86.274 kg)  Wt Readings from Last 3 Encounters:  08/08/14 190 lb 3.2 oz (86.274 kg)  07/30/14 190 lb 11.2 oz (86.501 kg)  07/18/14 195 lb 11.2 oz (88.769 kg)

## 2014-08-11 ENCOUNTER — Ambulatory Visit
Admission: RE | Admit: 2014-08-11 | Discharge: 2014-08-11 | Disposition: A | Discharge status: Home / Self Care | Payer: Medicare Other | Source: Ambulatory Visit | Attending: Radiation Oncology | Admitting: Radiation Oncology

## 2014-08-11 ENCOUNTER — Ambulatory Visit: Payer: Medicare Other

## 2014-08-12 ENCOUNTER — Ambulatory Visit: Payer: Medicare Other

## 2014-08-12 ENCOUNTER — Telehealth: Payer: Self-pay | Admitting: *Deleted

## 2014-08-12 ENCOUNTER — Ambulatory Visit
Admission: RE | Admit: 2014-08-12 | Discharge: 2014-08-12 | Disposition: A | Discharge status: Home / Self Care | Payer: Medicare Other | Source: Ambulatory Visit | Attending: Radiation Oncology | Admitting: Radiation Oncology

## 2014-08-12 NOTE — Telephone Encounter (Signed)
Called patient asking how he was feling, "I just want a break, I'm hurting too bad and irritated in my bottom', asked if he would come in and see on call MD eeven if no treatment for him today as he has missed 2nd day in a row, "No'I'll come in tomorrow", aksed if having a fever or diarrhea, No to fever, yes to loose stools, not taken any imodiym as yet today,thanked me for calling and checking on him,declined to come in  Today 10:51 AM

## 2014-08-12 NOTE — Telephone Encounter (Signed)
error 

## 2014-08-13 ENCOUNTER — Ambulatory Visit: Payer: Medicare Other | Admitting: Nurse Practitioner

## 2014-08-13 ENCOUNTER — Ambulatory Visit: Admission: RE | Admit: 2014-08-13 | Payer: Medicare Other | Source: Ambulatory Visit

## 2014-08-13 ENCOUNTER — Ambulatory Visit
Admission: RE | Admit: 2014-08-13 | Discharge: 2014-08-13 | Disposition: A | Discharge status: Home / Self Care | Payer: Medicare Other | Source: Ambulatory Visit | Attending: Radiation Oncology | Admitting: Radiation Oncology

## 2014-08-13 ENCOUNTER — Encounter: Payer: Self-pay | Admitting: Radiation Oncology

## 2014-08-13 ENCOUNTER — Other Ambulatory Visit: Payer: Medicare Other

## 2014-08-13 VITALS — BP 113/72 | HR 100 | Temp 97.9°F | Resp 12 | Wt 190.2 lb

## 2014-08-13 DIAGNOSIS — C210 Malignant neoplasm of anus, unspecified: Secondary | ICD-10-CM

## 2014-08-13 MED ORDER — OXYCODONE HCL ER 15 MG PO T12A: 15.0000 mg | EXTENDED_RELEASE_TABLET | Freq: Three times a day (TID) | ORAL | Status: DC

## 2014-08-13 NOTE — Progress Notes (Signed)
   Department of Radiation Oncology  Phone:  [phone] Fax:        [phone]  Weekly Treatment Note    Name: Dylan [name] Date: 08/13/2014 MRN: [id number] DOB: 04-10-1970   Current dose: 39.6 Gy  Current fraction: 22   MEDICATIONS: Current Outpatient Prescriptions  Medication Sig Dispense Refill  . loperamide (IMODIUM A-D) 2 MG tablet Take 2 mg by mouth 4 (four) times daily as needed for diarrhea or loose stools.    . Multiple Vitamin (MULTIVITAMIN) tablet Take 1 tablet by mouth daily.      . OxyCODONE (OXYCONTIN) 15 mg T12A 12 hr tablet Take 1 tablet (15 mg total) by mouth every 12 (twelve) hours. 60 tablet 0  . oxyCODONE-acetaminophen (PERCOCET/ROXICET) 5-325 MG per tablet Take 1 tablet by mouth every 4 (four) hours as needed for severe pain. 60 tablet 0   No current facility-administered medications for this encounter.     ALLERGIES: Review of patient's allergies indicates no known allergies.   LABORATORY DATA:  Lab Results  Component Value Date   WBC 7.3 06/26/2014   HGB 12.7* 06/26/2014   HCT 38.6 06/26/2014   MCV 82.9 06/26/2014   PLT 302 06/26/2014   Lab Results  Component Value Date   NA 141 06/26/2014   K 3.6 06/26/2014   CL 101 06/15/2010   CO2 26 06/26/2014   Lab Results  Component Value Date   ALT 14 06/26/2014   AST 11 06/26/2014   ALKPHOS 77 06/26/2014   BILITOT <0.20 06/26/2014     NARRATIVE: Zylon L Lust was seen today for weekly treatment management. The chart was checked and the patient's films were reviewed.  PAIN: He rates his pain as a 7 on a scale of 0-10. intermittent, sharp, burning, dull, stabbing and aching over Left buttock. BOWEL: Passing white mucus every 2 days, Soft bowel movement every 2 days and Rectal Bleeding, mild pain with bowel movements. SKIN:  Noted tenderness to treatment area. Aquaphor ointment. OTHER: Pt complains of weakness.  PHYSICAL EXAMINATION: weight is 190 lb 3.2 oz (86.274 kg). His oral  temperature is 97.9 F (36.6 C). His blood pressure is 113/72 and his pulse is 100. His respiration is 12 and oxygen saturation is 100%.        ASSESSMENT: The patient is doing satisfactorily with treatment. Patient has missed Monday, Tuesday treatments this week.   PLAN:. Advised patient to take Oxycotin every 8 hours. Patient requested gauze to cover treatment area at night. We will tentatively plan totake a break until next Tuesday.   ------------------------------------------------  Jodelle Gross, MD, PhD       This document serves as a record of services personally performed by Kyung Rudd, MD. It was created on his behalf by Derek Mound, a trained medical scribe. The creation of this record is based on the scribe's personal observations and the provider's statements to them. This document has been checked and approved by the attending provider.

## 2014-08-13 NOTE — Progress Notes (Signed)
PAIN: He rates his pain as a 7 on a scale of 0-10. intermittent, sharp, burning, dull, stabbing and aching over Left buttock. BOWEL: Passing white mucus every 2 days, Soft bowel movement every 2 days and Rectal Bleeding, mild pain with bowel movements. SKIN:  Noted tenderness to treatment area. Aquaphor ointment. OTHER: Pt complains of weakness. BP 113/72 mmHg  Pulse 100  Temp(Src) 97.9 F (36.6 C) (Oral)  Resp 12  Wt 190 lb 3.2 oz (86.274 kg)  SpO2 100% Wt Readings from Last 3 Encounters:  08/13/14 190 lb 3.2 oz (86.274 kg)  08/08/14 190 lb 3.2 oz (86.274 kg)  07/30/14 190 lb 11.2 oz (86.501 kg)

## 2014-08-14 ENCOUNTER — Ambulatory Visit
Admission: RE | Admit: 2014-08-14 | Discharge: 2014-08-14 | Disposition: A | Discharge status: Home / Self Care | Payer: Medicare Other | Source: Ambulatory Visit | Attending: Radiation Oncology | Admitting: Radiation Oncology

## 2014-08-14 ENCOUNTER — Ambulatory Visit: Payer: Medicare Other

## 2014-08-15 ENCOUNTER — Ambulatory Visit: Payer: Medicare Other

## 2014-08-15 ENCOUNTER — Ambulatory Visit
Admission: RE | Admit: 2014-08-15 | Discharge: 2014-08-15 | Disposition: A | Discharge status: Home / Self Care | Payer: Medicare Other | Source: Ambulatory Visit | Attending: Radiation Oncology | Admitting: Radiation Oncology

## 2014-08-19 ENCOUNTER — Ambulatory Visit: Payer: Medicare Other

## 2014-08-19 ENCOUNTER — Telehealth: Payer: Self-pay | Admitting: *Deleted

## 2014-08-19 ENCOUNTER — Encounter: Payer: Self-pay | Admitting: *Deleted

## 2014-08-19 NOTE — Telephone Encounter (Signed)
Called patient home, couldn't leave voice message, voice mail not set up as yet, called patient missed his 2pm appt for radiation treatment today,  Per [facility] therapist ,they called him this morning to let hom know of 2pm appt, he stated he would be here" 2:48 PM

## 2014-08-20 ENCOUNTER — Ambulatory Visit: Payer: Medicare Other

## 2014-08-20 ENCOUNTER — Ambulatory Visit: Admission: RE | Admit: 2014-08-20 | Payer: Medicare Other | Source: Ambulatory Visit

## 2014-08-20 ENCOUNTER — Ambulatory Visit
Admission: RE | Admit: 2014-08-20 | Discharge: 2014-08-20 | Disposition: A | Discharge status: Home / Self Care | Payer: Medicare Other | Source: Ambulatory Visit | Attending: Radiation Oncology | Admitting: Radiation Oncology

## 2014-08-20 ENCOUNTER — Encounter: Payer: Self-pay | Admitting: Radiation Oncology

## 2014-08-20 VITALS — BP 136/84 | HR 105 | Resp 16

## 2014-08-20 DIAGNOSIS — C210 Malignant neoplasm of anus, unspecified: Secondary | ICD-10-CM

## 2014-08-20 MED ORDER — OXYCODONE HCL 5 MG PO TABS: 5.0000 mg | ORAL_TABLET | ORAL | Status: DC | PRN

## 2014-08-20 NOTE — Progress Notes (Signed)
The patient was seen today without receiving a treatment. He has not completed a radiation treatment since 08/08/2014. The patient does understand the potential downfall of missing treatment for such an extended period of time. We once again discussed this. I would like to continue with treatment but the patient has decided not to proceed with treatment at this time including today. He continues to have a lot of discomfort. I am switching him from Percocet to oxycodone immediate relief. The patient has been taking several at a time and I do not want him to take too much Tylenol.  The patient will see tomorrow how he feels. Hopefully we can resume. Otherwise we will try to resume radiation treatment early next week. If he is not able to start by then, then I believe the radiation would increasingly become less effective and I would not resume treatment after a more extended period of time.

## 2014-08-20 NOTE — Progress Notes (Addendum)
Reports passing heavy clots of blood last night with his bowel movement. Reports stool without blood this morning. Reports severe rectal pain 10 on a scale of 0-10. Reports taking Percocet 3 tabs before bed each night and ran out of oxycontin two days ago. Reports he can't refill oxycontin until 08/29/2014. Patient refusing radiation treatment today. Patient unable to sit because of severe rectal pain. Denies nausea or vomiting.   BP 136/84 mmHg  Pulse 105  Resp 16 Wt Readings from Last 3 Encounters:  08/13/14 190 lb 3.2 oz (86.274 kg)  08/08/14 190 lb 3.2 oz (86.274 kg)  07/30/14 190 lb 11.2 oz (86.501 kg)

## 2014-08-20 NOTE — Progress Notes (Addendum)
Gave samples aquaphor and coupons for patient ,paoin med rx given to patient by MD 2:59 PM

## 2014-08-21 ENCOUNTER — Ambulatory Visit: Payer: Medicare Other

## 2014-08-22 ENCOUNTER — Ambulatory Visit: Payer: Medicare Other

## 2014-08-25 ENCOUNTER — Ambulatory Visit
Admission: RE | Admit: 2014-08-25 | Discharge: 2014-08-25 | Disposition: A | Discharge status: Home / Self Care | Payer: Medicare Other | Source: Ambulatory Visit | Attending: Radiation Oncology | Admitting: Radiation Oncology

## 2014-08-25 ENCOUNTER — Ambulatory Visit: Payer: Medicare Other

## 2014-08-25 DIAGNOSIS — C210 Malignant neoplasm of anus, unspecified: Secondary | ICD-10-CM | POA: Diagnosis not present

## 2014-08-26 ENCOUNTER — Ambulatory Visit: Payer: Medicare Other

## 2014-08-26 ENCOUNTER — Ambulatory Visit
Admission: RE | Admit: 2014-08-26 | Discharge: 2014-08-26 | Disposition: A | Discharge status: Home / Self Care | Payer: Medicare Other | Source: Ambulatory Visit | Attending: Radiation Oncology | Admitting: Radiation Oncology

## 2014-08-26 DIAGNOSIS — C210 Malignant neoplasm of anus, unspecified: Secondary | ICD-10-CM | POA: Diagnosis not present

## 2014-08-27 ENCOUNTER — Ambulatory Visit
Admission: RE | Admit: 2014-08-27 | Discharge: 2014-08-27 | Disposition: A | Discharge status: Home / Self Care | Payer: Medicare Other | Source: Ambulatory Visit | Attending: Radiation Oncology | Admitting: Radiation Oncology

## 2014-08-27 ENCOUNTER — Ambulatory Visit: Payer: Medicare Other

## 2014-08-27 ENCOUNTER — Encounter: Payer: Self-pay | Admitting: Radiation Oncology

## 2014-08-27 VITALS — BP 127/82 | HR 120 | Resp 16 | Wt 187.5 lb

## 2014-08-27 DIAGNOSIS — C210 Malignant neoplasm of anus, unspecified: Secondary | ICD-10-CM

## 2014-08-27 NOTE — Progress Notes (Signed)
   Department of Radiation Oncology  Phone:  [phone] Fax:        [phone]  Weekly Treatment Note    Name: Dylan [name] Date: 08/27/2014 MRN: [id number] DOB: Nov 14, 1970   Current dose: 45 Gy  Current fraction: 25   MEDICATIONS: Current Outpatient Prescriptions  Medication Sig Dispense Refill  . loperamide (IMODIUM A-D) 2 MG tablet Take 2 mg by mouth 4 (four) times daily as needed for diarrhea or loose stools.    . Multiple Vitamin (MULTIVITAMIN) tablet Take 1 tablet by mouth daily.      Marland Kitchen oxyCODONE (OXY IR/ROXICODONE) 5 MG immediate release tablet Take 1-2 tablets (5-10 mg total) by mouth every 4 (four) hours as needed for severe pain. 60 tablet 0  . OxyCODONE (OXYCONTIN) 15 mg T12A 12 hr tablet Take 1 tablet (15 mg total) by mouth every 8 (eight) hours. 90 tablet 0  . oxyCODONE-acetaminophen (PERCOCET/ROXICET) 5-325 MG per tablet Take 1 tablet by mouth every 4 (four) hours as needed for severe pain. 60 tablet 0   No current facility-administered medications for this encounter.     ALLERGIES: Review of patient's allergies indicates no known allergies.   LABORATORY DATA:  Lab Results  Component Value Date   WBC 7.3 06/26/2014   HGB 12.7* 06/26/2014   HCT 38.6 06/26/2014   MCV 82.9 06/26/2014   PLT 302 06/26/2014   Lab Results  Component Value Date   NA 141 06/26/2014   K 3.6 06/26/2014   CL 101 06/15/2010   CO2 26 06/26/2014   Lab Results  Component Value Date   ALT 14 06/26/2014   AST 11 06/26/2014   ALKPHOS 77 06/26/2014   BILITOT <0.20 06/26/2014     NARRATIVE: Dylan [name] was seen today for weekly treatment management. The chart was checked and the patient's films were reviewed.  Vitals stable. Reports rectal pain 7 on a scale of 0-10. Denies blood in stool. Denies nausea or vomiting. Patient down 3 lb from 08/13/14. No new complaints today. The patient feels that he will be able to finish the next week.  BP 127/82 mmHg  Pulse 120  Resp 16   Wt 187 lb 8 oz (85.049 kg) Wt Readings from Last 3 Encounters:  08/27/14 187 lb 8 oz (85.049 kg)  08/13/14 190 lb 3.2 oz (86.274 kg)  08/08/14 190 lb 3.2 oz (86.274 kg)     PHYSICAL EXAMINATION: weight is 187 lb 8 oz (85.049 kg). His blood pressure is 127/82 and his pulse is 120. His respiration is 16.        ASSESSMENT: The patient is doing satisfactorily with treatment.  PLAN: We will continue with the patient's radiation treatment as planned. I'm pleased that the patient has resumed treatment and he has received treatment each day this week. He has 5 more fractions. I encouraged him to finish this. I believe he will feel much better within 2-3 weeks after some healing has occurred.

## 2014-08-27 NOTE — Progress Notes (Addendum)
Vitals stable. Reports rectal pain 7 on a scale of 0-10. Denies blood in stool. Denies nausea or vomiting. Patient down 3 lb from 08/13/14.  BP 127/82 mmHg  Pulse 120  Resp 16  Wt 187 lb 8 oz (85.049 kg) Wt Readings from Last 3 Encounters:  08/27/14 187 lb 8 oz (85.049 kg)  08/13/14 190 lb 3.2 oz (86.274 kg)  08/08/14 190 lb 3.2 oz (86.274 kg)

## 2014-08-28 ENCOUNTER — Ambulatory Visit: Payer: Medicare Other

## 2014-08-28 ENCOUNTER — Ambulatory Visit
Admission: RE | Admit: 2014-08-28 | Discharge: 2014-08-28 | Disposition: A | Discharge status: Home / Self Care | Payer: Medicare Other | Source: Ambulatory Visit | Attending: Radiation Oncology | Admitting: Radiation Oncology

## 2014-08-28 DIAGNOSIS — C210 Malignant neoplasm of anus, unspecified: Secondary | ICD-10-CM | POA: Diagnosis not present

## 2014-08-29 ENCOUNTER — Encounter: Payer: Self-pay | Admitting: Nurse Practitioner

## 2014-08-29 ENCOUNTER — Ambulatory Visit: Payer: Medicare Other

## 2014-08-29 ENCOUNTER — Ambulatory Visit
Admission: RE | Admit: 2014-08-29 | Discharge: 2014-08-29 | Disposition: A | Discharge status: Home / Self Care | Payer: Medicare Other | Source: Ambulatory Visit | Attending: Radiation Oncology | Admitting: Radiation Oncology

## 2014-08-29 ENCOUNTER — Other Ambulatory Visit: Payer: Self-pay | Admitting: Nurse Practitioner

## 2014-08-29 DIAGNOSIS — C210 Malignant neoplasm of anus, unspecified: Secondary | ICD-10-CM | POA: Diagnosis not present

## 2014-09-01 ENCOUNTER — Ambulatory Visit
Admission: RE | Admit: 2014-09-01 | Discharge: 2014-09-01 | Disposition: A | Discharge status: Home / Self Care | Payer: Medicare Other | Source: Ambulatory Visit | Attending: Radiation Oncology | Admitting: Radiation Oncology

## 2014-09-01 ENCOUNTER — Telehealth: Payer: Self-pay | Admitting: Oncology

## 2014-09-01 DIAGNOSIS — C210 Malignant neoplasm of anus, unspecified: Secondary | ICD-10-CM | POA: Diagnosis not present

## 2014-09-01 NOTE — Telephone Encounter (Signed)
Mailed letter certified [employer] number [phone]

## 2014-09-02 ENCOUNTER — Ambulatory Visit
Admission: RE | Admit: 2014-09-02 | Discharge: 2014-09-02 | Disposition: A | Discharge status: Home / Self Care | Payer: Medicare Other | Source: Ambulatory Visit | Attending: Radiation Oncology | Admitting: Radiation Oncology

## 2014-09-02 DIAGNOSIS — C210 Malignant neoplasm of anus, unspecified: Secondary | ICD-10-CM | POA: Diagnosis not present

## 2014-09-03 ENCOUNTER — Encounter: Payer: Self-pay | Admitting: Radiation Oncology

## 2014-09-03 ENCOUNTER — Ambulatory Visit: Payer: Medicare Other

## 2014-09-03 ENCOUNTER — Ambulatory Visit
Admission: RE | Admit: 2014-09-03 | Discharge: 2014-09-03 | Disposition: A | Discharge status: Home / Self Care | Payer: Medicare Other | Source: Ambulatory Visit | Attending: Radiation Oncology | Admitting: Radiation Oncology

## 2014-09-03 VITALS — BP 119/73 | HR 119 | Resp 16 | Wt 187.9 lb

## 2014-09-03 DIAGNOSIS — C210 Malignant neoplasm of anus, unspecified: Secondary | ICD-10-CM | POA: Diagnosis not present

## 2014-09-03 MED ORDER — OXYCODONE HCL 5 MG PO TABS: 5.0000 mg | ORAL_TABLET | ORAL | Status: DC | PRN

## 2014-09-03 NOTE — Progress Notes (Signed)
  Radiation Oncology         (336) [phone] ________________________________  Name: Dylan [name] MRN: [id number]  Date: 09/03/2014  DOB: [birth year]  Weekly Radiation Therapy Management    ICD-9-CM ICD-10-CM   1. Anal cancer 154.3 C21.0     Current Dose: 54 Gy     Planned Dose:  54 Gy  Narrative . . . . . . . . The patient presents for routine under treatment assessment. The pt denies rectal bleeding. States, "my sores have opened back up." Patient referring to rectal irritation related to effects of radiation therapy. Reports using Aquaphor as directed. Denies nausea, vomiting, or diarrhea. Reports rectal pain 8 on a scale of 0-10. One month follow up appointment card given. Patient encouraged to contact staff with future needs. Patient verbalized understanding.                                                                   Set-up films were reviewed.                                 The chart was checked.  Current Outpatient Prescriptions on File Prior to Encounter  Medication Sig Dispense Refill  . loperamide (IMODIUM A-D) 2 MG tablet Take 2 mg by mouth 4 (four) times daily as needed for diarrhea or loose stools.    . Multiple Vitamin (MULTIVITAMIN) tablet Take 1 tablet by mouth daily.      Marland Kitchen oxyCODONE (OXY IR/ROXICODONE) 5 MG immediate release tablet Take 1-2 tablets (5-10 mg total) by mouth every 4 (four) hours as needed for severe pain. 60 tablet 0  . OxyCODONE (OXYCONTIN) 15 mg T12A 12 hr tablet Take 1 tablet (15 mg total) by mouth every 8 (eight) hours. 90 tablet 0  . oxyCODONE-acetaminophen (PERCOCET/ROXICET) 5-325 MG per tablet Take 1 tablet by mouth every 4 (four) hours as needed for severe pain. 60 tablet 0   No current facility-administered medications on file prior to encounter.   Physical Findings. . .  weight is 187 lb 14.4 oz (85.231 kg). His blood pressure is 119/73 and his pulse is 119. His respiration is 16.   Impression . . . . . . . The patient is tolerating  radiation. Plan . . . . . . . . . . . . The pt has completed his radiation treatment and will f/u with Dr. Lisbeth Renshaw in 1 month. In the meantime, I have refilled the pt's Oxycodone 5 MG immediate release with 60 tablets.  This document serves as a record of services personally performed by Gery Pray, MD. It was created on his behalf by Darcus Austin, a trained medical scribe. The creation of this record is based on the scribe's personal observations and the provider's statements to them. This document has been checked and approved by the attending provider.  ________________________________   Blair Promise, PhD, MD

## 2014-09-03 NOTE — Progress Notes (Addendum)
Vitals stable. Denies rectal bleeding. States, "my sores have opened back up."  Patient referring to rectal irritation related to effects of radiation therapy. Reports using Aquaphor as directed. Denies diarrhea. Denies nausea or vomiting. Reports rectal pain 8 on a scale of 0-10. One month follow up appointment card given. Patient encouraged to contact staff with future needs. Patient verbalized understanding.   BP 119/73 mmHg  Pulse 119  Resp 16  Wt 187 lb 14.4 oz (85.231 kg) Wt Readings from Last 3 Encounters:  09/03/14 187 lb 14.4 oz (85.231 kg)  08/27/14 187 lb 8 oz (85.049 kg)  08/13/14 190 lb 3.2 oz (86.274 kg)

## 2014-09-17 ENCOUNTER — Ambulatory Visit
Admission: RE | Admit: 2014-09-17 | Discharge: 2014-09-17 | Disposition: A | Discharge status: Home / Self Care | Payer: Medicare Other | Source: Ambulatory Visit | Attending: Radiation Oncology | Admitting: Radiation Oncology

## 2014-09-17 ENCOUNTER — Encounter: Payer: Self-pay | Admitting: *Deleted

## 2014-09-17 ENCOUNTER — Encounter: Payer: Self-pay | Admitting: Radiation Oncology

## 2014-09-17 DIAGNOSIS — C210 Malignant neoplasm of anus, unspecified: Secondary | ICD-10-CM

## 2014-09-17 MED ORDER — OXYCODONE HCL 5 MG PO TABS: 5.0000 mg | ORAL_TABLET | ORAL | Status: DC | PRN

## 2014-09-17 NOTE — Progress Notes (Signed)
Department of Radiation Oncology  Phone:  [phone] Fax:        [phone]  Weekly Treatment Note    Name: Dylan [name] Date: 09/17/2014 MRN: [id number] DOB: [birth year]      MEDICATIONS: Current Outpatient Prescriptions  Medication Sig Dispense Refill  . loperamide (IMODIUM A-D) 2 MG tablet Take 2 mg by mouth 4 (four) times daily as needed for diarrhea or loose stools.    . Multiple Vitamin (MULTIVITAMIN) tablet Take 1 tablet by mouth daily.      . OxyCODONE (OXYCONTIN) 15 mg T12A 12 hr tablet Take 1 tablet (15 mg total) by mouth every 8 (eight) hours. 90 tablet 0  . oxyCODONE-acetaminophen (PERCOCET/ROXICET) 5-325 MG per tablet Take 1 tablet by mouth every 4 (four) hours as needed for severe pain. 60 tablet 0  . oxyCODONE (OXY IR/ROXICODONE) 5 MG immediate release tablet Take 1-2 tablets (5-10 mg total) by mouth every 4 (four) hours as needed for severe pain. (Patient not taking: Reported on 09/17/2014) 60 tablet 0   No current facility-administered medications for this encounter.     ALLERGIES: Review of patient's allergies indicates no known allergies.   LABORATORY DATA:  Lab Results  Component Value Date   WBC 7.3 06/26/2014   HGB 12.7* 06/26/2014   HCT 38.6 06/26/2014   MCV 82.9 06/26/2014   PLT 302 06/26/2014   Lab Results  Component Value Date   NA 141 06/26/2014   K 3.6 06/26/2014   CL 101 06/15/2010   CO2 26 06/26/2014   Lab Results  Component Value Date   ALT 14 06/26/2014   AST 11 06/26/2014   ALKPHOS 77 06/26/2014   BILITOT <0.20 06/26/2014     NARRATIVE: Dylan [name] is a 44 year old male presenting to clinic today in regards to weekly treatment management of his cancer of the anus. Patient arrived to nursing requesting to see Dr. Lisbeth Renshaw due to extreme pain in rectal area with a 10/10 scale. He strongly requested pain medication. He presented to clinic today with no nausea. He has bowel movements daily with no blood in stool. However, he  states there was blood in his stool four days ago. The patient has symptoms of fatigue and poor appetite. He currently self administers Equate cream (a& D ointment), baby wipes and sitz baths. The chart was checked and the patient's films were reviewed. The patient was not accompanied by anyone for today's visit to clinic.  The patients vitals are stable. The patient is alert and oriented x3.  There were no vitals taken for this visit. Wt Readings from Last 3 Encounters:  09/17/14 184 lb 8 oz (83.689 kg)  09/03/14 187 lb 14.4 oz (85.231 kg)  08/27/14 187 lb 8 oz (85.049 kg)     PHYSICAL EXAMINATION: vitals were not taken for this visit.       ASSESSMENT: Dylan [name] is a 44 year old male presenting to clinic in regards to his treatment of cancer of the anus. The patient is struggling with management of certain symptoms currently occurring. Healthy management of these symptoms and methods to alleviate such symptoms were addressed.  PLAN: We will continue with the patient's radiation treatment as planned. Healthy management of current concerning symptoms of extreme anal pain were fully addressed and discussed with the patient. He has been encouraged to continue self administering Equate cream. A prescription re-fill of Oxycodone was ordered and provided for appropriate pain management, moving forward. If he develops any further questions  or concerns, he has been encouraged to contact Dr. Lisbeth Renshaw, MD.   This document serves as a record of services personally performed by Kyung Rudd, MD. It was created on his behalf by Lenn Cal, a trained medical scribe. The creation of this record is based on the scribe's personal observations and the provider's statements to them. This document has been checked and approved by the attending provider.  ------------------------------------------------  Dylan Gross, MD, PhD

## 2014-09-17 NOTE — Progress Notes (Signed)
I did give my business card again to the patient and informed him to call in the future before just arriving at nurse station, MD may not be able to see him that same day, as his schedule may not permit this or MD could be in another office that day, patient gave verbal understanding 9:54 AM

## 2014-09-17 NOTE — Progress Notes (Addendum)
Patient arrived to nursing requesting to see Dr. Lisbeth Renshaw pain in rectal area,  10/10 scale requesting pain medication, no nausea, has bowel movements daily, no blood in stool, was blood in stool 4 days ago stated, fatigued, appetite fair , forces himself to eat, , MD agreed to see patient  , is using equate cream (a& D ointment) using baby wipes and sitz baths 9:45 AM BP 121/80 mmHg  Pulse 116  Temp(Src) 98.3 F (36.8 C) (Oral)  Resp 20  184.5lb  Wt Readings from Last 3 Encounters:  09/03/14 187 lb 14.4 oz (85.231 kg)  08/27/14 187 lb 8 oz (85.049 kg)  08/13/14 190 lb 3.2 oz (86.274 kg)

## 2014-09-30 ENCOUNTER — Encounter (HOSPITAL_COMMUNITY): Payer: Self-pay | Admitting: Emergency Medicine

## 2014-09-30 ENCOUNTER — Emergency Department (HOSPITAL_COMMUNITY)
Admission: EM | Admit: 2014-09-30 | Discharge: 2014-09-30 | Disposition: A | Discharge status: Home / Self Care | Payer: Medicare Other | Attending: Emergency Medicine | Admitting: Emergency Medicine

## 2014-09-30 DIAGNOSIS — G8918 Other acute postprocedural pain: Secondary | ICD-10-CM | POA: Insufficient documentation

## 2014-09-30 DIAGNOSIS — K6289 Other specified diseases of anus and rectum: Secondary | ICD-10-CM

## 2014-09-30 DIAGNOSIS — Z720 Tobacco use: Secondary | ICD-10-CM | POA: Diagnosis not present

## 2014-09-30 DIAGNOSIS — Z85048 Personal history of other malignant neoplasm of rectum, rectosigmoid junction, and anus: Secondary | ICD-10-CM | POA: Insufficient documentation

## 2014-09-30 MED ORDER — OXYCODONE HCL ER 15 MG PO T12A: 15.0000 mg | EXTENDED_RELEASE_TABLET | Freq: Three times a day (TID) | ORAL | Status: DC | PRN

## 2014-09-30 NOTE — Progress Notes (Signed)
44 yr old medicare oncology py of [facility] noted in nursing note pt without a pcp.  Pt given medicare.gov web site and a 4 page list of medicare doctors within zip code [phone] to assist him with locating.a family dr.

## 2014-09-30 NOTE — ED Notes (Signed)
Pt had rectal surgery for cancer removal. Pt finished chemo 2 weeks ago. Pt sts that he has had rectal pain that continues to increase. Pt is unable to sit due to the pain. Pt does not have a PCP.

## 2014-09-30 NOTE — ED Notes (Signed)
Pt given d/c paperwork and verbalized understanding.  This Agricultural consultant confirmed that the Pt has PCP resource guide from Case Manager and is aware of future Oncology appointment.

## 2014-10-01 NOTE — Progress Notes (Signed)
  Radiation Oncology         [phone] ________________________________  Name: Dylan [name] MRN: [id number]  Date: 09/03/2014  DOB: 1970-09-30  End of Treatment Note  Diagnosis: anal cancer     Indication for treatment:  curative       Radiation treatment dates:   07/07/2014 through 09/03/2014  Site/dose:   The patient was treated with a course of IMRT using a simultaneous integrated boost technique. Daily image guidance was using during the treatment. The high dose region received a total of 54 Gy.  Narrative: The patient tolerated radiation treatment with some difficulty. The patient continued to have significant pain in the anal region and perianal region. This did prompt a break during treatment due to the patient's discomfort. He was able to recover and ultimately complete his prescribed course of treatment.  Plan: The patient has completed radiation treatment. The patient will return to radiation oncology clinic for routine followup in one month. I advised the patient to call or return sooner if they have any questions or concerns related to their recovery or treatment. ________________________________  Jodelle Gross, M.D., Ph.D.

## 2014-10-01 NOTE — Progress Notes (Signed)
  Radiation Oncology         [phone] ________________________________  Name: Dylan [name] MRN: [id number]  Date: 06/27/2014  DOB: 26-Jan-1971  SIMULATION AND TREATMENT PLANNING NOTE   CONSENT VERIFIED: yes   SET UP: Patient is set-up supine   IMMOBILIZATION: The following immobilization is used: Customized VAC lock bag. This complex treatment device will be used on a daily basis during the patient's treatment.   Diagnosis: Anal cancer   NARRATIVE: The patient was brought to the [facility]. Identity was confirmed. All relevant records and images related to the planned course of therapy were reviewed. Then, the patient was positioned in a stable reproducible clinical set-up for radiation therapy using an aquaplast mask. Skin markings were placed. The CT images were loaded into the planning software where the target and avoidance structures were contoured.The radiation prescription was entered and confirmed.   The patient will receive 54 Gy in 30 fractions to the high-dose target region.  Daily image guidance is ordered, and this will be used on a daily basis. This is necessary to ensure accurate and precise localization of the target in addition to accurate alignment of the normal tissue structures in this region. This is particularly important given the possible motion of the high-dose target.  Treatment planning then occurred.   I have requested : Intensity Modulated Radiotherapy (IMRT) is medically necessary for this case for the following reason: Dose homogeneity; the target is in close proximity to critical normal structures, including the femoral heads, bladder, and small bowel. IMRT is thus medically to appropriately treat the patient.   Special treatment procedure  The patient will receive chemotherapy during the course of radiation treatment. The patient may experience increased or overlapping toxicity due to this combined-modality approach and the patient  will be monitored for such problems. This may include extra lab work as necessary. This therefore constitutes a special treatment procedure.     ________________________________  Jodelle Gross, MD, PhD

## 2014-10-01 NOTE — Addendum Note (Signed)
Encounter addended by: Kyung Rudd, MD on: 10/01/2014  9:13 PM<BR>     Documentation filed: Notes Section, Visit Diagnoses

## 2014-10-08 ENCOUNTER — Telehealth: Payer: Self-pay | Admitting: *Deleted

## 2014-10-08 ENCOUNTER — Encounter: Payer: Self-pay | Admitting: Radiation Oncology

## 2014-10-08 ENCOUNTER — Ambulatory Visit
Admission: RE | Admit: 2014-10-08 | Discharge: 2014-10-08 | Disposition: A | Discharge status: Home / Self Care | Payer: Medicare Other | Source: Ambulatory Visit | Attending: Radiation Oncology | Admitting: Radiation Oncology

## 2014-10-08 VITALS — BP 135/69 | HR 116 | Temp 98.5°F | Resp 20 | Ht 73.0 in | Wt 184.2 lb

## 2014-10-08 DIAGNOSIS — C210 Malignant neoplasm of anus, unspecified: Secondary | ICD-10-CM

## 2014-10-08 MED ORDER — OXYCODONE HCL 5 MG PO TABS: 5.0000 mg | ORAL_TABLET | ORAL | Status: DC | PRN

## 2014-10-08 NOTE — Progress Notes (Signed)
Radiation Oncology         [phone] ________________________________  Name: Dylan [name] MRN: [id number]  Date: 10/08/2014  DOB: [birth year]  Follow-Up Visit Note  CC: No PCP Per Patient  No ref. provider found  Diagnosis:   Anal cancer  Interval Since Last Radiation:  3 weeks   Narrative:  The patient returns today for routine follow-up. Follow up s/p rad txs anal area 07/07/14-09/03/14. Pain 7/10 scale pressure with bowel movments, for 10 minutes then pain shoots up to a 10, also burning in rectal area. Patient was in ED on 09/30/14 increased pian was given rx for Oxycodone 15 mg. Needs refill on OXY IR 5mg . No nausea, bowel movemnts evry 4 days stated. Last one yesterday formed. Appetite up and down, eats smaller meals low fiber diet, drinks ensure 1 can in am, fatigued. Mr. Sibert notes that the pain has lessened over the past few weeks. The pain is worse at night when he relaxes. Patient has been using Aquaphor and Equate, which do not alleviate the irritation or burning. Patient notes that he is currently switching to a PCP.                          ALLERGIES:  has No Known Allergies.  Meds: Current Outpatient Prescriptions  Medication Sig Dispense Refill  . ibuprofen (ADVIL,MOTRIN) 200 MG tablet Take 200 mg by mouth every 6 (six) hours as needed for headache, mild pain or moderate pain.    . OxyCODONE (OXYCONTIN) 15 mg T12A 12 hr tablet Take 1 tablet (15 mg total) by mouth every 8 (eight) hours as needed (pain). 60 tablet 0  . oxyCODONE (OXY IR/ROXICODONE) 5 MG immediate release tablet Take 1-2 tablets (5-10 mg total) by mouth every 4 (four) hours as needed for severe pain. 90 tablet 0   No current facility-administered medications for this encounter.    Physical Findings: The patient is in no acute distress. Patient is alert and oriented.  height is 6\' 1"  (1.854 m) and weight is 184 lb 3.2 oz (83.553 kg). His oral temperature is 98.5 F (36.9 C). His blood pressure is 135/69  and his pulse is 116. His respiration is 20. Marland Kitchen   Persistent wound defect on the left in the perianal region. Persistent scarring/fibrosis but significant healing of desquamation of the radiation which was seen previously.  Lab Findings: Lab Results  Component Value Date   WBC 7.3 06/26/2014   HGB 12.7* 06/26/2014   HCT 38.6 06/26/2014   MCV 82.9 06/26/2014   PLT 302 06/26/2014     Radiographic Findings: No results found.  Impression:  Patient has recovered some from the radiation treatment but continues to have a persistent wound as described above. Much of his previous care earlier was at [facility] but the patient wishes to have all of his care here in [place]. Patient may benefit from seeing the Wound Care clinic given his current findings.   Plan:  I have referred for Mr. Millon to consult with the Wound Care clinic. I have refilled Mr. Philipp' Oxycodone 15 mg prescription today. We will follow up in 1 month.   This document serves as a record of services personally performed by Kyung Rudd, MD. It was created on his behalf by Arlyce Harman, a trained medical scribe. The creation of this record is based on the scribe's personal observations and the provider's statements to them. This document has been checked and approved by the  attending provider. ------------------------------------------------  Jodelle Gross, MD, PhD

## 2014-10-08 NOTE — Telephone Encounter (Signed)
CALLED PATIENT TO INFORM OF WOUND CLINIC APPT. ON 10-24-14 - ARRIVAL TIME - 8 AM @ [address], [address] -D, SPOKE WITH PATIENT AND HE  IS AWARE OF THIS APPT.

## 2014-10-08 NOTE — Progress Notes (Addendum)
Follow up s/p rad txs anal area 07/07/14-09/03/14,  Pain 7/10 scale pressure with bowel movments , for 10 minutes then pain shoots up to a 10, also burning in rectal area , was in ED on 09/30/14 increased pian was given rx for Oxycodone 15 mg  Needs refill on OXY IR 5mg , no naysea, bowel movemnts evry 4 days stated  Last one yesterday formed ,  Appetite up and down, eats smaller meals low fiber diet, drinks ensure 1 can in am, fatigued 10:07 AM BP 135/69 mmHg  Pulse 116  Temp(Src) 98.5 F (36.9 C) (Oral)  Resp 20  Ht 6\' 1"  (1.854 m)  Wt 184 lb 3.2 oz (83.553 kg)  BMI 24.31 kg/m2  Wt Readings from Last 3 Encounters:  10/08/14 184 lb 3.2 oz (83.553 kg)  09/17/14 184 lb 8 oz (83.689 kg)  09/03/14 187 lb 14.4 oz (85.231 kg)   Checked rectal area, healing, dry, but very odorous foul smelling

## 2014-10-09 NOTE — ED Provider Notes (Signed)
CSN: [id number]     Arrival date & time 09/30/14  1038 History   First MD Initiated Contact with Patient 09/30/14 1157     Chief Complaint  Patient presents with  . Post-op Problem     (Consider location/radiation/quality/duration/timing/severity/associated sxs/prior Treatment) HPI   24 old male with rectal pain. Patient has a past history rectal cancer. Status post surgery and has received ongoing radiation treatment as well as chemotherapy. He continues to have significant rectal pain. This significantly impacted his quality of life. Unable to sit. Very uncomfortable to do pretty much anything. Has been taking oxycodone when she does get some relief from. No drainage. No fevers or chills.   Past Medical History  Diagnosis Date  . Cancer    History reviewed. No pertinent past surgical history. History reviewed. No pertinent family history. Social History  Substance Use Topics  . Smoking status: Current Every Day Smoker -- 0.30 packs/day    Types: Cigarettes  . Smokeless tobacco: None  . Alcohol Use: No    Review of Systems  All systems reviewed and negative, other than as noted in HPI.   Allergies  Review of patient's allergies indicates no known allergies.  Home Medications   Prior to Admission medications   Medication Sig Start Date End Date Taking? Authorizing Provider  ibuprofen (ADVIL,MOTRIN) 200 MG tablet Take 200 mg by mouth every 6 (six) hours as needed for headache, mild pain or moderate pain.   Yes Historical Provider, MD  oxyCODONE (OXY IR/ROXICODONE) 5 MG immediate release tablet Take 1-2 tablets (5-10 mg total) by mouth every 4 (four) hours as needed for severe pain. 10/08/14   Kyung Rudd, MD  OxyCODONE (OXYCONTIN) 15 mg T12A 12 hr tablet Take 1 tablet (15 mg total) by mouth every 8 (eight) hours as needed (pain). 09/30/14   Virgel Manifold, MD   BP 143/97 mmHg  Pulse 102  Temp(Src) 97.9 F (36.6 C) (Oral)  Resp 20  SpO2 100% Physical Exam  Constitutional:  He appears well-developed and well-nourished. No distress.  Standing beside bed. Appears mildly uncomfortable, but not toxic.  HENT:  Head: Normocephalic and atraumatic.  Eyes: Conjunctivae are normal. Right eye exhibits no discharge. Left eye exhibits no discharge.  Neck: Neck supple.  Cardiovascular: Normal rate, regular rhythm and normal heart sounds.  Exam reveals no gallop and no friction rub.   No murmur heard. Pulmonary/Chest: Effort normal and breath sounds normal. No respiratory distress.  Abdominal: Soft. He exhibits no distension. There is no tenderness.  Musculoskeletal: He exhibits no edema or tenderness.  Chronic appearing wounds to left buttock and rectal region. Granulation tissue at base. No drainage or other concerning skin findings.  Neurological: He is alert.  Skin: Skin is warm and dry.  Psychiatric: He has a normal mood and affect. His behavior is normal. Thought content normal.  Nursing note and vitals reviewed.   ED Course  Procedures (including critical care time) Labs Review Labs Reviewed - No data to display  Imaging Review No results found. I have personally reviewed and evaluated these images and lab results as part of my medical decision-making.   EKG Interpretation None      MDM   Final diagnoses:  Rectal pain    61 rolled male with rectal pain. Patient has a history rectal cancer. Status post surgery and ongoing radiation. His chronic appearing changes to his buttocks and rectal area. No evidence concerning for infection. Plan continue symptomatic treatment. Low suspicion for acute complication.  Virgel Manifold, MD 10/09/14 [phone]

## 2014-10-24 ENCOUNTER — Encounter (HOSPITAL_BASED_OUTPATIENT_CLINIC_OR_DEPARTMENT_OTHER): Payer: Medicare Other | Attending: Internal Medicine

## 2014-11-10 ENCOUNTER — Encounter: Payer: Self-pay | Admitting: Radiation Oncology

## 2014-11-10 ENCOUNTER — Ambulatory Visit
Admission: RE | Admit: 2014-11-10 | Discharge: 2014-11-10 | Disposition: A | Discharge status: Home / Self Care | Payer: Medicare Other | Source: Ambulatory Visit | Attending: Radiation Oncology | Admitting: Radiation Oncology

## 2014-11-10 VITALS — BP 120/73 | HR 100 | Temp 98.3°F | Resp 20 | Wt 186.9 lb

## 2014-11-10 DIAGNOSIS — C210 Malignant neoplasm of anus, unspecified: Secondary | ICD-10-CM

## 2014-11-10 MED ORDER — OXYCODONE HCL 5 MG PO TABS: 5.0000 mg | ORAL_TABLET | ORAL | Status: DC | PRN

## 2014-11-10 NOTE — Progress Notes (Signed)
Follow up s/p anal radiation 07/07/14-09/03/14, pain 6/10 rectal, cannot sit vor very long, needs refill on Oxy IR 5mg  tablets, , appetite good, has bowel movements every other day, no nausea, energy level air  11:09 AM BP 120/73 mmHg  Pulse 100  Temp(Src) 98.3 F (36.8 C) (Oral)  Resp 20  Wt 186 lb 14.4 oz (84.777 kg)  Wt Readings from Last 3 Encounters:  11/10/14 186 lb 14.4 oz (84.777 kg)  10/08/14 184 lb 3.2 oz (83.553 kg)  09/17/14 184 lb 8 oz (83.689 kg)

## 2014-11-10 NOTE — Progress Notes (Signed)
  Radiation Oncology         [phone] ________________________________  Name: Dylan [name] MRN: [id number]  Date: 11/10/2014  DOB: 06/24/1970  Follow-Up Visit Note  CC: No PCP Per Patient  No ref. provider found  Diagnosis: Anal Cancer   Interval Since Last Radiation: 2 months   -Radiation treatment dates:07/07/2014 through 09/03/2014   Narrative:  The patient returns today for routine follow-up.  Follow up s/p anal radiation 07/07/14-09/03/14, pain 6/10 rectal, cannot sit for very long, needs refill on Oxy IR 5mg  tablets, , appetite good, has bowel movements every other day, no nausea, energy level fair.                            ALLERGIES:  has No Known Allergies.  Meds: Current Outpatient Prescriptions  Medication Sig Dispense Refill  . ibuprofen (ADVIL,MOTRIN) 200 MG tablet Take 200 mg by mouth every 6 (six) hours as needed for headache, mild pain or moderate pain.    Marland Kitchen oxyCODONE (OXY IR/ROXICODONE) 5 MG immediate release tablet Take 1-2 tablets (5-10 mg total) by mouth every 4 (four) hours as needed for severe pain. 90 tablet 0  . OxyCODONE (OXYCONTIN) 15 mg T12A 12 hr tablet Take 1 tablet (15 mg total) by mouth every 8 (eight) hours as needed (pain). 60 tablet 0   No current facility-administered medications for this encounter.    Physical Findings: The patient is in no acute distress. Patient is alert and oriented.  weight is 186 lb 14.4 oz (84.777 kg). His oral temperature is 98.3 F (36.8 C). His blood pressure is 120/73 and his pulse is 100. His respiration is 20. Marland Kitchen   General: Well-developed, in no acute distress HEENT: Normocephalic, atraumati Extremities: No edema present Anal: Skin in anal region has continued to recover from radiation treatment. Patient has persistent defect in left perianal region, appears slightly smaller than last visit  Lab Findings: Lab Results  Component Value Date   WBC 7.3 06/26/2014   HGB 12.7* 06/26/2014   HCT 38.6 06/26/2014   MCV 82.9 06/26/2014   PLT 302 06/26/2014   Radiographic Findings: No results found.  Impression: The patient is recovering from the effects of radiation. Patient still experiencing pain during bowel movements.    Plan: Refilled short-acting pain medication. Follow up in 3 months.   Re-schedule wound care appointment, patient missed appointment.   ------------------------------------------------  Jodelle Gross, MD, PhD   This document serves as a record of services personally performed by Kyung Rudd, MD. It was created on his behalf by Derek Mound, a trained medical scribe. The creation of this record is based on the scribe's personal observations and the provider's statements to them. This document has been checked and approved by the attending provider.

## 2014-11-11 ENCOUNTER — Telehealth: Payer: Self-pay | Admitting: *Deleted

## 2014-11-11 NOTE — Telephone Encounter (Signed)
Called patient to inform of wound care appt. On 12-03-14- arrival time- 9:15 am @ [facility]- address- [address] [place] [phone], spoke with patient and he is aware of this appt., and mailed appt. Card to patient.

## 2014-12-03 ENCOUNTER — Telehealth: Payer: Self-pay | Admitting: *Deleted

## 2014-12-03 ENCOUNTER — Encounter (HOSPITAL_BASED_OUTPATIENT_CLINIC_OR_DEPARTMENT_OTHER): Payer: Medicare Other

## 2014-12-03 ENCOUNTER — Other Ambulatory Visit: Payer: Self-pay | Admitting: Radiation Oncology

## 2014-12-03 MED ORDER — OXYCODONE HCL ER 15 MG PO T12A: 15.0000 mg | EXTENDED_RELEASE_TABLET | Freq: Two times a day (BID) | ORAL | Status: DC

## 2014-12-03 NOTE — Telephone Encounter (Signed)
Patient called requesting refill on his Oxcodone/Oxycontin 15 mg  He states his pain in his rectum isn't better , will inform Dr. Lisbeth Renshaw and will call patient if refill is done ,pata9ient thanked this RN 9:06 AM

## 2014-12-03 NOTE — Telephone Encounter (Signed)
Called patient to inform that script is ready for pick-up, spoke with patient and he is aware of this 

## 2014-12-26 ENCOUNTER — Encounter (HOSPITAL_BASED_OUTPATIENT_CLINIC_OR_DEPARTMENT_OTHER): Payer: Medicare Other | Attending: Internal Medicine

## 2014-12-26 DIAGNOSIS — C44520 Squamous cell carcinoma of anal skin: Secondary | ICD-10-CM | POA: Diagnosis not present

## 2014-12-26 DIAGNOSIS — K626 Ulcer of anus and rectum: Secondary | ICD-10-CM | POA: Diagnosis not present

## 2014-12-26 DIAGNOSIS — Z923 Personal history of irradiation: Secondary | ICD-10-CM | POA: Diagnosis not present

## 2015-02-04 ENCOUNTER — Encounter: Payer: Self-pay | Admitting: Radiation Oncology

## 2015-02-04 ENCOUNTER — Telehealth: Payer: Self-pay | Admitting: *Deleted

## 2015-02-04 ENCOUNTER — Ambulatory Visit
Admission: RE | Admit: 2015-02-04 | Discharge: 2015-02-04 | Disposition: A | Discharge status: Home / Self Care | Payer: Medicare Other | Source: Ambulatory Visit | Attending: Radiation Oncology | Admitting: Radiation Oncology

## 2015-02-04 VITALS — BP 130/77 | HR 77 | Temp 98.3°F | Resp 20 | Ht 73.0 in | Wt 190.3 lb

## 2015-02-04 DIAGNOSIS — C210 Malignant neoplasm of anus, unspecified: Secondary | ICD-10-CM

## 2015-02-04 MED ORDER — OXYCODONE HCL 5 MG PO TABS: 5.0000 mg | ORAL_TABLET | ORAL | Status: DC | PRN

## 2015-02-04 NOTE — Telephone Encounter (Signed)
Called patient home phone, couldn't leave a voice message the answering machine hasn't been set up as yet, he missed his 1000am  appt with Dr. Lisbeth Renshaw 10:20 AM

## 2015-02-04 NOTE — Progress Notes (Signed)
Follow up s/p rad txs anal cancer 07/07/14-7/20/216, pain 6/7 on 10 scale, hurts more at night when lying down, rectum, having 1-2 bowel movement every 2 days, has 5 pain meds left only taking the oxycodone 5mg  tablets, , appetite good,  11:09 AM BP 130/77 mmHg  Pulse 77  Temp(Src) 98.3 F (36.8 C) (Oral)  Resp 20  Ht 6\' 1"  (1.854 m)  Wt 190 lb 4.8 oz (86.32 kg)  BMI 25.11 kg/m2  Wt Readings from Last 3 Encounters:  02/04/15 190 lb 4.8 oz (86.32 kg)  11/10/14 186 lb 14.4 oz (84.777 kg)  10/08/14 184 lb 3.2 oz (83.553 kg)

## 2015-02-04 NOTE — Progress Notes (Signed)
  Radiation Oncology         [phone] ________________________________  Name: Dylan [name] MRN: [id number]  Date: 02/04/2015  DOB: May 29, 1970  Follow-Up Visit Note  CC: No PCP Per Patient  No ref. provider found  Diagnosis: Anal Cancer   Interval Since Last Radiation: 5 months  -Radiation treatment dates:07/07/2014 through 09/03/2014   Narrative:  The patient returns today for routine follow-up.  Follow up s/p rad txs anal cancer 07/07/14-7/20/216. He states his pain is a 6/7 on 10 scale. He mentions that his rectum hurts more at night when lying down. He is having 1-2 bowel movement every 2 days. He has 5 pain meds left and is only taking the oxycodone 5mg  tablets. His appetite is good. He denies diarrhea. He mentions that there is a little bleeding from the rectum, but is not accompanied by pain. He has a good appetite and denies nausea. He states that he had a PET scan at [facility] since he was last seen that he states didn't show any active disease.    ALLERGIES:  has No Known Allergies.  Meds: Current Outpatient Prescriptions  Medication Sig Dispense Refill  . oxyCODONE (OXY IR/ROXICODONE) 5 MG immediate release tablet Take 1-2 tablets (5-10 mg total) by mouth every 4 (four) hours as needed for severe pain. 90 tablet 0  . ibuprofen (ADVIL,MOTRIN) 200 MG tablet Take 200 mg by mouth every 6 (six) hours as needed for headache, mild pain or moderate pain.     No current facility-administered medications for this encounter.    Physical Findings: The patient is in no acute distress. Patient is alert and oriented.  height is 6\' 1"  (1.854 m) and weight is 190 lb 4.8 oz (86.32 kg). His oral temperature is 98.3 F (36.8 C). His blood pressure is 130/77 and his pulse is 77. His respiration is 20. Marland Kitchen   General: Well-developed, in no acute distress HEENT: Normocephalic, atraumatic Extremities: No edema present Anal: Skin in anal region has continued to recover from radiation  treatment. Lesion in the left anal region looks improved. Has continued to slowly heal with surrounding thickening and firmness in the anal region. No new lesions.   Lab Findings: Lab Results  Component Value Date   WBC 7.3 06/26/2014   HGB 12.7* 06/26/2014   HCT 38.6 06/26/2014   MCV 82.9 06/26/2014   PLT 302 06/26/2014   Radiographic Findings: No results found.  Impression: The patient is recovering from the effects of radiation. The left anal region is healing slowly.   Plan: He has an appointment at [facility] next week to get a biopsy. He will then follow up with the wound clinic to see what their recommendations are regarding the slowly healing ulcerative area in the left anal canal. I refilled his prescription of Oxycodone 5 mg. I will follow up with him in four months.   ------------------------------------------------  Jodelle Gross, MD, PhD   This document serves as a record of services personally performed by Kyung Rudd, MD. It was created on his behalf by  Lendon Collar, a trained medical scribe. The creation of this record is based on the scribe's personal observations and the provider's statements to them. This document has been checked and approved by the attending provider.

## 2015-03-02 ENCOUNTER — Other Ambulatory Visit: Payer: Self-pay | Admitting: Radiation Oncology

## 2015-03-02 ENCOUNTER — Telehealth: Payer: Self-pay | Admitting: *Deleted

## 2015-03-02 MED ORDER — OXYCODONE HCL 5 MG PO TABS: 5.0000 mg | ORAL_TABLET | ORAL | Status: DC | PRN

## 2015-03-02 NOTE — Telephone Encounter (Signed)
Patient called stating he is requesting  A refill on pain medication and that his testiccles has swollen thi sam and doesn't know why, is it related to his radiation back in 09/03/14 completed rectal,  He has an appt in [place]  03/11/14, his f/u appt is in April with [place], I will ask Dr,.Moody and give patient a call back 10:44 AM

## 2015-03-02 NOTE — Telephone Encounter (Signed)
Called patient he can pick up rx or send someone to come get today before 5pm   ,he will send someone to pick up today,thanked MD and this RN for the return call, , also to go to the Urgent care or Call his Primary MD about the swelling in his testes 2:41 PM

## 2015-03-03 ENCOUNTER — Other Ambulatory Visit: Payer: Self-pay | Admitting: Radiation Oncology

## 2015-05-27 ENCOUNTER — Telehealth: Payer: Self-pay | Admitting: *Deleted

## 2015-05-27 ENCOUNTER — Ambulatory Visit
Admission: RE | Admit: 2015-05-27 | Discharge: 2015-05-27 | Disposition: A | Discharge status: Home / Self Care | Payer: Medicare Other | Source: Ambulatory Visit | Attending: Radiation Oncology | Admitting: Radiation Oncology

## 2015-05-27 ENCOUNTER — Encounter: Payer: Self-pay | Admitting: Radiation Oncology

## 2015-05-27 HISTORY — DX: Personal history of irradiation: Z92.3

## 2015-05-27 NOTE — Progress Notes (Signed)
CT Chest Abdomen Pelvis W Contrast (01/05/2015 3:59 PM) CT Chest Abdomen Pelvis W Contrast (01/05/2015 3:59 PM)  Narrative  EXAM: CT Chest, Abdomen & Pelvis with contrast   DATE: 01/05/15 15:59:57  ACCESSION: [id number] UN  DICTATED: 01/05/15 16:32:54  INTERPRETATION LOCATION: [place]    CLINICAL INDICATION: 45 Year Old (M): C21.0 - Squamous cell cancer, anus (RAF-HCC).    COMPARISON: PET/CT 05/01/14    TECHNIQUE: A spiral CT scan was obtained with IV contrast from the thoracic inlet through the pubic symphysis. Images were reconstructed in the axial plane.Coronal and sagittal reformatted images were also provided for further evaluation. Oral contrast was also administered.    FINDINGS:   CHEST:   Shotty, subcentimeter mediastinal lymph nodes. No pathologically enlarged thoracic adenopathy.     Partially calcified soft tissue lesion in the subcutaneous fat of the upper right chest is unchanged (2:12).    Normal heart size. No pericardial effusion. Normal caliber thoracic aorta.    The central airways are patent. Bilateral lower lobe dependent subsegmental atelectasis. Subpleural blebs in the apices. No focal airspace consolidation or pulmonary nodule. No pleural effusion.     ABDOMEN and PELVIS:   The liver, partially contracted gallbladder, spleen, pancreas, adrenal glands, and kidneys are within normal limits.    No bowel obstruction or acute bowel inflammation. Normal appendix.     The soft tissue lesion about the anus is stable to slightly decreased in size. Soft tissue infiltration along the gluteal cleft, left greater than right, is slightly more thickened compared to prior PET/CT, particularly along the superior left aspect. There is extensive stranding of the perineum as well as both inguinal regions in the anterior pelvic wall. There is extensive edema and skin thickening involving the scrotum. This pelvic inflammation is new since the prior  PET/CT. Prominent bilateral inguinal and pelvic sidewall lymph nodes are not significantly changed. For reference, the previously FDG avid external iliac chain node on the left measures 1.1 cm (3:76), previously 1.3 cm. There is increased mesenteric stranding in the pelvis particularly in the presacral space where there is trace layering fluid. Surgical clips in the left inguinal region are unchanged.    The aorta and its branch vessels are patent. The portal vein, splenic vein, and SMV are patent.     There is no free intraperitoneal air.    BONES:   There are no acute osseous abnormalities or worrisome osseous lesions. Ill-defined sclerosis of the right iliac bone unchanged.    IMPRESSION:  1. Soft tissue lesion of the anus is stable to slightly decreased. Soft tissue infiltration along the gluteal cleft is also mostly stable. There is an area of increased nodular thickening in the superior left gluteal region possibly representing extension of disease. Correlation with physical exam recommended. Bilateral inguinal and pelvic adenopathy is not significant changed.    2. Extensive inflammatory change of the subcutaneous fat of the bilateral inguinal regions as well as the perineum and scrotal wall. Findings may represent posttreatment changes following radiation.    CT Chest Abdomen Pelvis W Contrast (01/05/2015 3:59 PM)  Procedure Note  Interface, Rad Results In - Mon Jan 05, 2015 8:07 PM EST  EXAM: CT Chest, Abdomen & Pelvis with contrast  DATE: 01/05/15 15:59:57 ACCESSION: [id number] UN DICTATED: 01/05/15 16:32:54 INTERPRETATION LOCATION: [place]  CLINICAL INDICATION: 45 Year Old (M): C21.0 - Squamous cell cancer, anus (RAF-HCC).  COMPARISON: PET/CT 05/01/14  TECHNIQUE: A spiral CT scan was obtained with IV contrast from the thoracic inlet through  the pubic symphysis. Images were reconstructed in the axial plane. Coronal and sagittal reformatted images were also  provided for further evaluation. Oral contrast was also administered.  FINDINGS:  CHEST:  Shotty, subcentimeter mediastinal lymph nodes. No pathologically enlarged thoracic adenopathy.   Partially calcified soft tissue lesion in the subcutaneous fat of the upper right chest is unchanged (2:12).  Normal heart size. No pericardial effusion. Normal caliber thoracic aorta.  The central airways are patent. Bilateral lower lobe dependent subsegmental atelectasis. Subpleural blebs in the apices. No focal airspace consolidation or pulmonary nodule. No pleural effusion.   ABDOMEN and PELVIS:  The liver, partially contracted gallbladder, spleen, pancreas, adrenal glands, and kidneys are within normal limits.  No bowel obstruction or acute bowel inflammation. Normal appendix.   The soft tissue lesion about the anus is stable to slightly decreased in size. Soft tissue infiltration along the gluteal cleft, left greater than right, is slightly more thickened compared to prior PET/CT, particularly along the superior left aspect. There is extensive stranding of the perineum as well as both inguinal regions in the anterior pelvic wall. There is extensive edema and skin thickening involving the scrotum. This pelvic inflammation is new since the prior PET/CT. Prominent bilateral inguinal and pelvic sidewall lymph nodes are not significantly changed. For reference, the previously FDG avid external iliac chain node on the left measures 1.1 cm (3:76), previously 1.3 cm. There is increased mesenteric stranding in the pelvis particularly in the presacral space where there is trace layering fluid. Surgical clips in the left inguinal region are unchanged.  The aorta and its branch vessels are patent. The portal vein, splenic vein, and SMV are patent.   There is no free intraperitoneal air.  BONES:  There are no acute osseous abnormalities or worrisome osseous lesions. Ill-defined sclerosis of the right iliac bone  unchanged.  IMPRESSION: 1. Soft tissue lesion of the anus is stable to slightly decreased. Soft tissue infiltration along the gluteal cleft is also mostly stable. There is an area of increased nodular thickening in the superior left gluteal region possibly representing extension of disease. Correlation with physical exam recommended. Bilateral inguinal and pelvic adenopathy is not significant changed.  2. Extensive inflammatory change of the subcutaneous fat of the bilateral inguinal regions as well as the perineum and scrotal wall. Findings may represent posttreatment changes following radiation.   Back to top of Results from Last 3 Months Document Information Primary Care Provider [place] (Jan. 15, 2016 - Present) [id number] (Work) [address] [place], [place] [address]

## 2015-05-27 NOTE — Telephone Encounter (Signed)
Called the patient , his appt was for 100am, asked if he was on his way or needed to reschedule?"He forgot and apologized will need to reschedule, Enid Derry has rescheduled patient next Tuesday 0@ 245pm with Shona Simpson, PA-C,will inform MD and Bryson Ha, patent is agreeable to this time change

## 2015-06-02 ENCOUNTER — Ambulatory Visit: Admission: RE | Admit: 2015-06-02 | Payer: Medicare Other | Source: Ambulatory Visit | Admitting: Radiation Oncology

## 2015-06-02 ENCOUNTER — Ambulatory Visit
Admission: RE | Admit: 2015-06-02 | Discharge: 2015-06-02 | Disposition: A | Discharge status: Home / Self Care | Payer: Medicare Other | Source: Ambulatory Visit | Attending: Radiation Oncology | Admitting: Radiation Oncology

## 2016-02-15 DIAGNOSIS — C801 Malignant (primary) neoplasm, unspecified: Secondary | ICD-10-CM

## 2016-02-15 HISTORY — DX: Malignant (primary) neoplasm, unspecified: C80.1

## 2017-02-23 DIAGNOSIS — G893 Neoplasm related pain (acute) (chronic): Secondary | ICD-10-CM | POA: Diagnosis present

## 2018-02-14 HISTORY — PX: ILEOSTOMY: SHX1783

## 2018-02-14 HISTORY — PX: COLOSTOMY: SHX63

## 2018-06-29 ENCOUNTER — Telehealth: Payer: Self-pay | Admitting: Internal Medicine

## 2018-06-29 NOTE — Telephone Encounter (Signed)
11:10am: Patient contacted for initial Palliative Care telehealth visit. Visit scheduled for Monday, May 18th at noon. Zoom invite sent to patient's e-mail: [email or web address].  Violeta Gelinas NP-C  [phone]

## 2018-07-02 ENCOUNTER — Other Ambulatory Visit: Payer: Medicare Other | Admitting: Internal Medicine

## 2018-07-02 ENCOUNTER — Other Ambulatory Visit: Payer: Self-pay

## 2018-07-02 DIAGNOSIS — Z515 Encounter for palliative care: Secondary | ICD-10-CM

## 2018-07-02 NOTE — Progress Notes (Signed)
    May 18th, 2020 [place] Consult Note Telephone: [phone]  Fax: [phone]  *Due to the current COVID-19 infection/crises, the patient and family prefer, and have given their verbal consent for, a provider visit via telehealth, from my office. HIPPA policies of confidentially were discussed.  PATIENT NAME: Dylan [name] DOB: 01-10-1971 MRN: [id number]  PRIMARY CARE PROVIDER:  No PCP  REFERRING PROVIDER:  Fredric Dine, NP [facility] Provider: Dr. Randon Goldsmith, Dr. Marena Chancy  RESPONSIBLE PARTY:  (dtr/agent) [name] 336 [phone]. (dtr) [name] ([phone]  Patient referral received from his [place] Provider Fredric Dine NP, to assume management of pain medications, and for ongoing discussions regarding goals of care and advance care directives. The goal was to transfer his care closer to his home town of [place].  I discussed with patient that, though I could assist his PCP with pain management, our practice needs to work under the auspices of a primary care provider. [name] mentioned that he is actively seeking a PCP here in [place], preferably one who would make house calls. I provided patient with a list of 3 providers who he could contact, who are accepting patients and also provide home visits.  [name] will contact me (I provided my contact information) after he establishes with a PCP here in [place], and then to establish with our Palliative Care Services.  Violeta Gelinas NP-C [phone]

## 2018-07-05 ENCOUNTER — Telehealth: Payer: Self-pay | Admitting: Internal Medicine

## 2018-07-05 NOTE — Telephone Encounter (Signed)
10:20am:   RTC to voice mail for Sonia Baller (nsg coordinator [place] [phone]); I left my name and contact number.  Violeta Gelinas NP-C [phone]

## 2018-07-06 ENCOUNTER — Telehealth: Payer: Self-pay | Admitting: Internal Medicine

## 2018-07-06 NOTE — Telephone Encounter (Signed)
10:30am:   Left message (RTC) to voice mail for Sonia Baller (nsg coordinator [place] [phone]); I left my name and contact number.  Violeta Gelinas NP-C [phone]

## 2018-07-19 ENCOUNTER — Telehealth: Payer: Self-pay | Admitting: Internal Medicine

## 2018-07-19 NOTE — Telephone Encounter (Signed)
I received a TC from Quay Burow NP-C [place] at [facility]. Caren Griffins mentioned that she saw patient recently, and again encouraged him to establish with a PCP here in his home town of [place], with the plan to subsequently transfer his Palliative here to [place]. I called patient to see is he had reached out to any of the 3 providers I had given him the names of . These providers could also make house calls. Mr. Fini asked if I would resend the list, which I did.   [email or web address] Dr. Clovia Cuff: [phone] Olen Pel NP: [phone] Alvester Chou NP: [phone]  Patient has my contact information, and I asked that he call me when he has established with a PCP here in [place].  Violeta Gelinas NP-C [phone]

## 2018-08-14 ENCOUNTER — Telehealth: Payer: Self-pay | Admitting: Internal Medicine

## 2018-08-14 NOTE — Telephone Encounter (Signed)
4:33pm: TC ([phone]) to patient to possibly schedule f/u tele-health visit, pending his having established with a PCP here in [place].  No answer; voice mail had not been set up. This is the same number as for his daughter.  Violeta Gelinas NP-C [phone]

## 2018-08-21 ENCOUNTER — Telehealth: Payer: Self-pay | Admitting: Internal Medicine

## 2018-08-21 NOTE — Telephone Encounter (Signed)
Called patient on behalf of Violeta Gelinas, NP to find out if he has established a PCP yet, no answer and I was unable to leave a message due to no voicemail set up.  Will follow-up

## 2018-08-30 ENCOUNTER — Telehealth: Payer: Self-pay

## 2018-08-30 NOTE — Telephone Encounter (Signed)
Phone call placed to offer to schedule FU visit. Daughter phone- no answer and no VM set up. Sister phone- not a working number

## 2018-09-02 ENCOUNTER — Telehealth: Payer: Self-pay | Admitting: Internal Medicine

## 2018-09-02 NOTE — Telephone Encounter (Signed)
09/02/2018  I sent patient an e-mail to check if  I could reach out  see how he was doing, and check if he was able to establish with a primary care physician here in [place]. I mentioned that our office tried to contact him at the [phone] number but it was no longer a working number. I left my contact phone number, and asked if he would call me at his convienence.   Violeta Gelinas NP-C [phone]

## 2018-09-05 ENCOUNTER — Telehealth: Payer: Self-pay | Admitting: Internal Medicine

## 2018-09-05 NOTE — Telephone Encounter (Signed)
Contacted the patient again to try and schedule the Palliative Consult, no answer and no voicemail set up.

## 2018-09-27 ENCOUNTER — Telehealth: Payer: Self-pay | Admitting: Internal Medicine

## 2018-09-27 NOTE — Telephone Encounter (Signed)
Called patient to see if he had established care with a MD from the list that Violeta Gelinas, NP had sent him, no answer and unable to leave a voicemail.

## 2018-10-02 ENCOUNTER — Telehealth: Payer: Self-pay | Admitting: Internal Medicine

## 2018-10-02 NOTE — Telephone Encounter (Signed)
Contacted Sonia Baller, Clinical Care Coordinator at [facility], to let her know that we have been unable to reach patient to find out if he has been able to see a PCP in his local area, no answer.  Left message requesting a call back, left my name and contact number.

## 2018-10-02 NOTE — Telephone Encounter (Signed)
Rec'd call back from [place], [place] Coordinator at [facility], explained to her that we have not been able to speak with patient to find out if he has been able to establish care with a local PCP.  Sonia Baller said that patient is scheduled to be seen by oncologist on 10/09/18 and she said she would try and reach out to the patient herself to let him know that we have been trying to reach.  She will call me back with any updates.

## 2018-10-17 ENCOUNTER — Ambulatory Visit (INDEPENDENT_AMBULATORY_CARE_PROVIDER_SITE_OTHER): Payer: Medicare Other | Admitting: Family Medicine

## 2018-10-17 ENCOUNTER — Other Ambulatory Visit: Payer: Self-pay

## 2018-10-17 ENCOUNTER — Encounter: Payer: Self-pay | Admitting: Family Medicine

## 2018-10-17 VITALS — BP 91/57 | HR 112 | Temp 99.0°F | Resp 12 | Wt 146.8 lb

## 2018-10-17 DIAGNOSIS — D649 Anemia, unspecified: Secondary | ICD-10-CM | POA: Diagnosis not present

## 2018-10-17 DIAGNOSIS — I959 Hypotension, unspecified: Secondary | ICD-10-CM | POA: Diagnosis not present

## 2018-10-17 DIAGNOSIS — C210 Malignant neoplasm of anus, unspecified: Secondary | ICD-10-CM

## 2018-10-17 DIAGNOSIS — R000 Tachycardia, unspecified: Secondary | ICD-10-CM | POA: Diagnosis not present

## 2018-10-17 DIAGNOSIS — G893 Neoplasm related pain (acute) (chronic): Secondary | ICD-10-CM

## 2018-10-17 DIAGNOSIS — R42 Dizziness and giddiness: Secondary | ICD-10-CM

## 2018-10-17 LAB — CBC
Hematocrit: 23.3 % — ABNORMAL LOW (ref 37.5–51.0)
Hemoglobin: 7.4 g/dL — ABNORMAL LOW (ref 13.0–17.7)
MCH: 19.7 pg — ABNORMAL LOW (ref 26.6–33.0)
MCHC: 31.8 g/dL (ref 31.5–35.7)
MCV: 62 fL — ABNORMAL LOW (ref 79–97)
Platelets: 621 10*3/uL — ABNORMAL HIGH (ref 150–450)
RBC: 3.76 x10E6/uL — ABNORMAL LOW (ref 4.14–5.80)
RDW: 17.2 % — ABNORMAL HIGH (ref 11.6–15.4)
WBC: 13.1 10*3/uL — ABNORMAL HIGH (ref 3.4–10.8)

## 2018-10-17 NOTE — Patient Instructions (Addendum)
  As we discussed today I am concerned about your low blood pressure and elevated heart rate.  I will check a blood count today and should have those results back in a few hours.  We will call you if needed.  Plan follow-up in the next few weeks once I have discussed other meds and plans further with your treating oncologist.  Thank you for coming in today.  If any new or worsening lightheadedness, dizziness, or acute worsening symptoms, call 911 or go to the emergency room.  If you have lab work done today you will be contacted with your lab results within the next 2 weeks.  If you have not heard from [place] then please contact us. The fastest way to get your results is to register for My Chart.   IF you received an x-ray today, you will receive an invoice from [facility]. Please contact [facility] at [phone] with questions or concerns regarding your invoice.   IF you received labwork today, you will receive an invoice from [facility]. Please contact [facility] at [phone] with questions or concerns regarding your invoice.   Our billing staff will not be able to assist you with questions regarding bills from these companies.  You will be contacted with the lab results as soon as they are available. The fastest way to get your results is to activate your My Chart account. Instructions are located on the last page of this paperwork. If you have not heard from [place] regarding the results in 2 weeks, please contact this office.

## 2018-10-17 NOTE — Progress Notes (Signed)
Subjective:    Patient ID: Dylan [name], male    DOB: 27-Dec-1970, 48 y.o.   MRN: [id number]  HPI Dylan [name] is a 48 y.o. male Presents today for: Chief Complaint  Patient presents with  . Establish Care    here today to establish care.    Here to establish primary care provider. 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    Oncology - [facility] Hematology/oncology.  Dr. [name] in [place], most recent telemedicine appointment  with [name], 10/09/18.  has declined chemotherapy, radiation.- potential for infections discussed by treating providers.  Option of pembrolizumab discussed in April, and if decided to go that route would need laboratory work and restaging imaging. He is not interested in the checkpoint inhibitor therapy.  Intermittent episodes of bowel incontinence throughout the week noted on last telemed visit -  having to use incontinence pads.  Thought to be cancer progression.  Recommended he follow-up with Dr. [name].  [facility] team has treated patient in past, but unable to write opioids.Full code.   cancer related pain regimen: Oncology notes reviewed.  Multiple long-acting opioids including methadone, MS Contin, OxyContin have been used with difficulty tolerating.  Confusion with MS Contin, difficult for him to work on medications.  Did not want to try fentanyl given experiences with other long-acting opioids.  Also other adjuvant therapies including Lyrica, gabapentin, Cymbalta.  Noted that he has been reliable, not requiring early refills, and having appropriate urine samples. Reported stable control of pain at August 25 visit.  Oxycodone 20 mg morning, 30 mg before work around lunchtime and 20 mg at night.  He is unsure if pain meds are going to be  continued to be prescribed at that office or if I am being asked to prescribe, but has some meds at home and has refill available.   Support system: dtr Geralyn Flash, [phone], dtr. Auri Mehrer [phone]  Hypotension: Appointment reviewed from April 23rd with oncologist.  Dizzy with standing, baseline blood pressures have been low previously.  Reported adequate amounts of salt and fluids at that visit. I do not see vital signs on that visit.   Last vital signs seen from care everywhere, March 9, BP 103/57 with pulse 110, weight 165 pounds.  HGB on 09/07/17 - 9.5 Reports chronic rectal bleeding past year and a half. More in past 6 months. Today, he denies incontinence.  Less lightheaded, but still some with standing - last few seconds. . No syncope/near syncope.  UOP 3-4 times per day. Clear UOP.  Not working at this time. Spends time at home.  Not taking any antihypertensives, no supplements.  No alcohol.  Weight loss with decreased appetite past few months. Denies depression, denies n/v.  Would consider other treatments for cancer if weight increased.  Prior normal weight 180-185 about 6 months ago. Has been losing weight.   Has not tried remeron or appetite stimulant.  Wt Readings from Last 3 Encounters:  10/17/18 146 lb 12.8 oz (66.6 kg)  02/04/15 190 lb 4.8 oz (86.3 kg)  11/10/14 186 lb 14.4 oz (84.8 kg)    BP Readings from Last 3 Encounters:  10/17/18 (!) 91/57  02/04/15 130/77  11/10/14 120/73   Depression screen PHQ 2/9 10/17/2018  Decreased Interest 0  Down, Depressed, Hopeless 0  PHQ - 2 Score 0  Patient Active Problem List   Diagnosis Date Noted  . Anal cancer ([place]) 02/10/2014   Past Medical History:  Diagnosis Date  . Cancer ([place]) 2018   anal cancer  . S/P radiation therapy 07/07/14-7/20/216   anal ca    No past surgical history on file. No Known Allergies Prior to Admission medications   Medication Sig Start Date End Date Taking?  Authorizing Provider  ibuprofen (ADVIL,MOTRIN) 200 MG tablet Take 200 mg by mouth every 6 (six) hours as needed for headache, mild pain or moderate pain.   Yes [provider]  oxyCODONE (OXY IR/ROXICODONE) 5 MG immediate release tablet Take 1-2 tablets (5-10 mg total) by mouth every 4 (four) hours as needed for severe pain. 03/02/15  Yes Hayden Pedro, PA-C   Social History   Socioeconomic History  . Marital status: Single    Spouse name: Not on file  . Number of children: Not on file  . Years of education: Not on file  . Highest education level: Not on file  Occupational History  . Not on file  Social Needs  . Financial resource strain: Not on file  . Food insecurity    Worry: Not on file    Inability: Not on file  . Transportation needs    Medical: Not on file    Non-medical: Not on file  Tobacco Use  . Smoking status: Current Some Day Smoker    Packs/day: 0.30    Types: Cigarettes  . Smokeless tobacco: Current User  Substance and Sexual Activity  . Alcohol use: No  . Drug use: Not on file  . Sexual activity: Not on file  Lifestyle  . Physical activity    Days per week: Not on file    Minutes per session: Not on file  . Stress: Not on file  Relationships  . Social Herbalist on phone: Not on file    Gets together: Not on file    Attends religious service: Not on file    Active member of club or organization: Not on file    Attends meetings of clubs or organizations: Not on file    Relationship status: Not on file  . Intimate partner violence    Fear of current or ex partner: Not on file    Emotionally abused: Not on file    Physically abused: Not on file    Forced sexual activity: Not on file  Other Topics Concern  . Not on file  Social History Narrative  . Not on file    Review of Systems Per HPI.     Objective:   Physical Exam Constitutional:      General: He is not in acute distress.    Appearance: He is well-developed. He  is not toxic-appearing or diaphoretic.     Comments: Appears fatigued but ambulates without assistance. No acute distress.   HENT:     Head: Normocephalic and atraumatic.  Cardiovascular:     Rate and Rhythm: Tachycardia present.  Pulmonary:     Effort: Pulmonary effort is normal. No respiratory distress.     Breath sounds: Normal breath sounds. No wheezing or rales.  Abdominal:     General: Bowel sounds are normal. There is no distension.     Tenderness: There is no abdominal tenderness. There is no guarding.  Skin:    General: Skin is warm and dry.  Neurological:     General: No focal deficit present.  Mental Status: He is alert and oriented to person, place, and time.  Psychiatric:        Attention and Perception: Attention normal.        Mood and Affect: Affect is flat.        Speech: Speech normal.        Behavior: Behavior is slowed.    Vitals:   10/17/18 1317 10/17/18 1321  BP: (!) 95/56 (!) 91/57  Pulse: (!) 110 (!) 112  Resp: 12   Temp: 99 F (37.2 C)   TempSrc: Oral   SpO2: 99%   Weight: 146 lb 12.8 oz (66.6 kg)     Orthostatic VS for the past 24 hrs (Last 3 readings):  BP- Lying Pulse- Lying BP- Standing at 0 minutes Pulse- Standing at 0 minutes BP- Standing at 3 minutes Pulse- Standing at 3 minutes  10/17/18 1435 95/65 80 95/61 128 92/61 139  10/17/18 1433 96/52 105 92/50 120 90/50 96   Results for orders placed or performed in visit on 10/17/18  CBC  Result Value Ref Range   WBC 13.1 (H) 3.4 - 10.8 x10E3/uL   RBC 3.76 (L) 4.14 - 5.80 x10E6/uL   Hemoglobin 7.4 (L) 13.0 - 17.7 g/dL   Hematocrit 23.3 (L) 37.5 - 51.0 %   MCV 62 (L) 79 - 97 fL   MCH 19.7 (L) 26.6 - 33.0 pg   MCHC 31.8 31.5 - 35.7 g/dL   RDW 17.2 (H) 11.6 - 15.4 %   Platelets 621 (H) 150 - 450 x10E3/uL   No new cough dysuria or areas of infection. No fever.     Assessment & Plan:  ROWELL FOSNIGHT is a 48 y.o. male Hypotension, unspecified hypotension type - Plan: Basic metabolic  panel, Orthostatic vital signs  Tachycardia - Plan: Basic metabolic panel, TSH  Cancer related pain  Anemia, unspecified type - Plan: CBC, CANCELED: POCT CBC  Episodic lightheadedness - Plan: Orthostatic vital signs  Squamous cell cancer, anus (HCC)   History of invasive squamous cell cancer, history of high-grade non-Hodgkin's lymphoma status post systemic treatment, anemia as above.  Not currently on treatment.  Followed by hematology oncology at [facility]  Reports increased rectal bleeding past 6 months  Episodic lightheadedness, weight loss.  19 pounds noted since March 9.  Previous borderline low blood pressures noted at prior visit with specialist, but lower readings in office today, along with orthostasis, tachycardia -also higher than prior visits.  Hemoglobin has decreased from 9.5-7.4, but unknown timing of that decline.  Additionally now with leukocytosis (WBC 8.6 in July 2019, now 13.1) without known source of infection or infectious symptoms at this time.  Thrombocytosis also increased from 481 in July 2019 to 621 at present.  -Although some fatigue/lightheadedness could be related to chronic narcotic pain medication use, concern for worsening anemia, symptomatic anemia with orthostasis, tachycardia, hypotension, and continued rectal bleeding.  -Recommended ER evaluation to determine need for transfusion.  He agrees to be seen in local ER.  -We will need to coordinate care with his oncologist and patient regarding goals of care given potential progressive disease, and now likely symptomatic anemia.  -Plan also discussed with daughter [place] at his request.      Over 62min chart review prior to visit, 20 min visit and 16 min call after visit with review of plan.      No orders of the defined types were placed in this encounter.  Patient Instructions    As we discussed today  I am concerned about your low blood pressure and elevated heart rate.  I will check a blood count  today and should have those results back in a few hours.  We will call you if needed.  Plan follow-up in the next few weeks once I have discussed other meds and plans further with your treating oncologist.  Thank you for coming in today.  If any new or worsening lightheadedness, dizziness, or acute worsening symptoms, call 911 or go to the emergency room.  If you have lab work done today you will be contacted with your lab results within the next 2 weeks.  If you have not heard from [place] then please contact us. The fastest way to get your results is to register for My Chart.   IF you received an x-ray today, you will receive an invoice from [facility]. Please contact [facility] at [phone] with questions or concerns regarding your invoice.   IF you received labwork today, you will receive an invoice from [place]. Please contact [facility] at [phone] with questions or concerns regarding your invoice.   Our billing staff will not be able to assist you with questions regarding bills from these companies.  You will be contacted with the lab results as soon as they are available. The fastest way to get your results is to activate your My Chart account. Instructions are located on the last page of this paperwork. If you have not heard from [place] regarding the results in 2 weeks, please contact this office.       Signed,   Merri Ray, MD [facility].  10/17/18 7:19 PM

## 2018-10-18 ENCOUNTER — Emergency Department (HOSPITAL_COMMUNITY): Payer: Medicare Other

## 2018-10-18 ENCOUNTER — Inpatient Hospital Stay (HOSPITAL_COMMUNITY)
Admission: EM | Admit: 2018-10-18 | Discharge: 2018-10-21 | DRG: 871 | Disposition: A | Discharge status: Home Health | Payer: Medicare Other | Attending: Internal Medicine | Admitting: Internal Medicine

## 2018-10-18 ENCOUNTER — Encounter (HOSPITAL_COMMUNITY): Payer: Self-pay | Admitting: *Deleted

## 2018-10-18 ENCOUNTER — Other Ambulatory Visit: Payer: Self-pay

## 2018-10-18 DIAGNOSIS — D509 Iron deficiency anemia, unspecified: Secondary | ICD-10-CM

## 2018-10-18 DIAGNOSIS — Z8571 Personal history of Hodgkin lymphoma: Secondary | ICD-10-CM

## 2018-10-18 DIAGNOSIS — E872 Acidosis: Secondary | ICD-10-CM | POA: Diagnosis present

## 2018-10-18 DIAGNOSIS — E440 Moderate protein-calorie malnutrition: Secondary | ICD-10-CM | POA: Diagnosis present

## 2018-10-18 DIAGNOSIS — Z20828 Contact with and (suspected) exposure to other viral communicable diseases: Secondary | ICD-10-CM | POA: Diagnosis present

## 2018-10-18 DIAGNOSIS — E43 Unspecified severe protein-calorie malnutrition: Secondary | ICD-10-CM | POA: Insufficient documentation

## 2018-10-18 DIAGNOSIS — Z923 Personal history of irradiation: Secondary | ICD-10-CM | POA: Diagnosis not present

## 2018-10-18 DIAGNOSIS — E611 Iron deficiency: Secondary | ICD-10-CM | POA: Diagnosis not present

## 2018-10-18 DIAGNOSIS — K625 Hemorrhage of anus and rectum: Secondary | ICD-10-CM | POA: Diagnosis present

## 2018-10-18 DIAGNOSIS — F17210 Nicotine dependence, cigarettes, uncomplicated: Secondary | ICD-10-CM | POA: Diagnosis present

## 2018-10-18 DIAGNOSIS — Z85048 Personal history of other malignant neoplasm of rectum, rectosigmoid junction, and anus: Secondary | ICD-10-CM | POA: Diagnosis not present

## 2018-10-18 DIAGNOSIS — A419 Sepsis, unspecified organism: Principal | ICD-10-CM | POA: Diagnosis present

## 2018-10-18 DIAGNOSIS — Z6821 Body mass index (BMI) 21.0-21.9, adult: Secondary | ICD-10-CM | POA: Diagnosis not present

## 2018-10-18 DIAGNOSIS — L089 Local infection of the skin and subcutaneous tissue, unspecified: Secondary | ICD-10-CM

## 2018-10-18 DIAGNOSIS — L89893 Pressure ulcer of other site, stage 3: Secondary | ICD-10-CM | POA: Diagnosis present

## 2018-10-18 DIAGNOSIS — Z8572 Personal history of non-Hodgkin lymphomas: Secondary | ICD-10-CM | POA: Diagnosis not present

## 2018-10-18 DIAGNOSIS — R000 Tachycardia, unspecified: Secondary | ICD-10-CM | POA: Diagnosis present

## 2018-10-18 DIAGNOSIS — I959 Hypotension, unspecified: Secondary | ICD-10-CM

## 2018-10-18 DIAGNOSIS — D649 Anemia, unspecified: Secondary | ICD-10-CM | POA: Diagnosis present

## 2018-10-18 DIAGNOSIS — C210 Malignant neoplasm of anus, unspecified: Secondary | ICD-10-CM

## 2018-10-18 LAB — RETICULOCYTES
Immature Retic Fract: 28.7 % — ABNORMAL HIGH (ref 2.3–15.9)
RBC.: 3.88 MIL/uL — ABNORMAL LOW (ref 4.22–5.81)
Retic Count, Absolute: 58.2 10*3/uL (ref 19.0–186.0)
Retic Ct Pct: 1.5 % (ref 0.4–3.1)

## 2018-10-18 LAB — CORTISOL: Cortisol, Plasma: 9.2 ug/dL

## 2018-10-18 LAB — URINALYSIS, ROUTINE W REFLEX MICROSCOPIC
Bilirubin Urine: NEGATIVE
Glucose, UA: NEGATIVE mg/dL
Hgb urine dipstick: NEGATIVE
Ketones, ur: NEGATIVE mg/dL
Nitrite: NEGATIVE
Protein, ur: 30 mg/dL — AB
Specific Gravity, Urine: 1.02 (ref 1.005–1.030)
pH: 6 (ref 5.0–8.0)

## 2018-10-18 LAB — POC OCCULT BLOOD, ED: Fecal Occult Bld: NEGATIVE

## 2018-10-18 LAB — BASIC METABOLIC PANEL
Anion gap: 12 (ref 5–15)
BUN/Creatinine Ratio: 14 (ref 9–20)
BUN: 11 mg/dL (ref 6–24)
BUN: 10 mg/dL (ref 6–20)
CO2: 23 mmol/L (ref 20–29)
CO2: 23 mmol/L (ref 22–32)
Calcium: 9.6 mg/dL (ref 8.7–10.2)
Calcium: 9.6 mg/dL (ref 8.9–10.3)
Chloride: 95 mmol/L — ABNORMAL LOW (ref 96–106)
Chloride: 98 mmol/L (ref 98–111)
Creatinine, Ser: 0.77 mg/dL (ref 0.76–1.27)
Creatinine, Ser: 0.99 mg/dL (ref 0.61–1.24)
GFR calc Af Amer: 122 mL/min/{1.73_m2} (ref >59)
GFR calc Af Amer: >60 mL/min (ref >60)
GFR calc non Af Amer: 106 mL/min/{1.73_m2} (ref >59)
GFR calc non Af Amer: >60 mL/min (ref >60)
Glucose, Bld: 106 mg/dL — ABNORMAL HIGH (ref 70–99)
Glucose: 92 mg/dL (ref 65–99)
Potassium: 4.8 mmol/L (ref 3.5–5.2)
Potassium: 4.4 mmol/L (ref 3.5–5.1)
Sodium: 132 mmol/L — ABNORMAL LOW (ref 134–144)
Sodium: 133 mmol/L — ABNORMAL LOW (ref 135–145)

## 2018-10-18 LAB — FERRITIN: Ferritin: 25 ng/mL (ref 24–336)

## 2018-10-18 LAB — IRON AND TIBC
Iron: 11 ug/dL — ABNORMAL LOW (ref 45–182)
Saturation Ratios: 5 % — ABNORMAL LOW (ref 17.9–39.5)
TIBC: 214 ug/dL — ABNORMAL LOW (ref 250–450)
UIBC: 203 ug/dL

## 2018-10-18 LAB — CBC
HCT: 26.3 % — ABNORMAL LOW (ref 39.0–52.0)
Hemoglobin: 7.7 g/dL — ABNORMAL LOW (ref 13.0–17.0)
MCH: 19.6 pg — ABNORMAL LOW (ref 26.0–34.0)
MCHC: 29.3 g/dL — ABNORMAL LOW (ref 30.0–36.0)
MCV: 67.1 fL — ABNORMAL LOW (ref 80.0–100.0)
Platelets: 625 10*3/uL — ABNORMAL HIGH (ref 150–400)
RBC: 3.92 MIL/uL — ABNORMAL LOW (ref 4.22–5.81)
RDW: 16.6 % — ABNORMAL HIGH (ref 11.5–15.5)
WBC: 12 10*3/uL — ABNORMAL HIGH (ref 4.0–10.5)
nRBC: 0 % (ref 0.0–0.2)

## 2018-10-18 LAB — TSH: TSH: 1.68 u[IU]/mL (ref 0.450–4.500)

## 2018-10-18 LAB — HEPATIC FUNCTION PANEL
ALT: 24 U/L (ref 0–44)
AST: 14 U/L — ABNORMAL LOW (ref 15–41)
Albumin: 2.2 g/dL — ABNORMAL LOW (ref 3.5–5.0)
Alkaline Phosphatase: 103 U/L (ref 38–126)
Bilirubin, Direct: <0.1 mg/dL (ref 0.0–0.2)
Total Bilirubin: 0.1 mg/dL — ABNORMAL LOW (ref 0.3–1.2)
Total Protein: 8.2 g/dL — ABNORMAL HIGH (ref 6.5–8.1)

## 2018-10-18 LAB — VITAMIN B12: Vitamin B-12: 357 pg/mL (ref 180–914)

## 2018-10-18 LAB — FOLATE: Folate: 9.6 ng/mL (ref >5.9)

## 2018-10-18 LAB — LACTIC ACID, PLASMA
Lactic Acid, Venous: 3.6 mmol/L (ref 0.5–1.9)
Lactic Acid, Venous: 1.1 mmol/L (ref 0.5–1.9)

## 2018-10-18 LAB — TROPONIN I (HIGH SENSITIVITY): Troponin I (High Sensitivity): 4 ng/L (ref <18)

## 2018-10-18 MED ORDER — SODIUM CHLORIDE 0.9% FLUSH
3.0000 mL | Freq: Once | INTRAVENOUS | Status: AC
  Administered 2018-10-18: 15:00:00 3 mL via INTRAVENOUS

## 2018-10-18 MED ORDER — ACETAMINOPHEN 650 MG RE SUPP: 650.0000 mg | Freq: Four times a day (QID) | RECTAL | Status: DC | PRN

## 2018-10-18 MED ORDER — SODIUM CHLORIDE 0.9 % IV BOLUS
2000.0000 mL | Freq: Once | INTRAVENOUS | Status: AC
  Administered 2018-10-18: 2000 mL via INTRAVENOUS

## 2018-10-18 MED ORDER — ACETAMINOPHEN 325 MG PO TABS: 650.0000 mg | ORAL_TABLET | Freq: Four times a day (QID) | ORAL | Status: DC | PRN

## 2018-10-18 MED ORDER — SODIUM CHLORIDE 0.9 % IV SOLN
INTRAVENOUS | Status: AC
  Administered 2018-10-18 – 2018-10-19 (×2): via INTRAVENOUS

## 2018-10-18 MED ORDER — VANCOMYCIN HCL 10 G IV SOLR
1750.0000 mg | INTRAVENOUS | Status: DC
  Administered 2018-10-19: 1750 mg via INTRAVENOUS
  Filled 2018-10-18 (×2): qty 1750

## 2018-10-18 MED ORDER — ENSURE ENLIVE PO LIQD
237.0000 mL | Freq: Two times a day (BID) | ORAL | Status: DC
  Administered 2018-10-19 – 2018-10-20 (×4): 237 mL via ORAL

## 2018-10-18 MED ORDER — PIPERACILLIN-TAZOBACTAM 3.375 G IVPB 30 MIN
3.3750 g | Freq: Once | INTRAVENOUS | Status: AC
  Administered 2018-10-18: 3.375 g via INTRAVENOUS
  Filled 2018-10-18: qty 50

## 2018-10-18 MED ORDER — PIPERACILLIN-TAZOBACTAM 3.375 G IVPB
3.3750 g | Freq: Three times a day (TID) | INTRAVENOUS | Status: DC
  Administered 2018-10-19 – 2018-10-21 (×8): 3.375 g via INTRAVENOUS
  Filled 2018-10-18 (×8): qty 50

## 2018-10-18 MED ORDER — IOHEXOL 300 MG/ML  SOLN
100.0000 mL | Freq: Once | INTRAMUSCULAR | Status: AC | PRN
  Administered 2018-10-18: 100 mL via INTRAVENOUS

## 2018-10-18 MED ORDER — VANCOMYCIN HCL IN DEXTROSE 1-5 GM/200ML-% IV SOLN
1000.0000 mg | Freq: Once | INTRAVENOUS | Status: AC
  Administered 2018-10-18: 1000 mg via INTRAVENOUS
  Filled 2018-10-18: qty 200

## 2018-10-18 NOTE — ED Triage Notes (Signed)
Pt very vague at triage. Reports being sent here due to low bp. Only complaint is fatigue and has hx of cancer.

## 2018-10-18 NOTE — ED Provider Notes (Addendum)
Signed out by Dr Darl Householder to admit to medicine when ct resulted.  Ct resulted.   bp currenlty 95/60. Hr 88. Pulse ox 99%.   Medicine service consulted for admission.  From review chart, it appears pt has seen palliative care previous - may be of benefit getting them involved this admission.       Lajean Saver, MD 10/18/18 [phone]

## 2018-10-18 NOTE — H&P (Addendum)
TRH H&P    Patient Demographics:    Dylan [name], is a 48 y.o. male  MRN: [id number]  DOB - 07/09/1970  Admit Date - 10/18/2018  Referring MD/NP/PA: Alphonzo Lemmings  Outpatient Primary MD for the patient is [name], MD Oncology Dr. Christia Reading Brotheron \ Dylan Flock FNP PCP: [facility] per oncology office note [facility] 07/17/2018   Patient coming from:  home  Chief complaint-  Hypotension    HPI:    Dylan [name]  is a 48 y.o. male,  w hx of rectal cancer (squamous cell)  S/p resection 09/28/2010 w deep abscess , and h/o non-hodgkins lymphoma dx1999, smoker, following with palliative care at [facility] presents w hypotension.   In Ed,  T 98.2, P 134, R 20, Bp 85/64  Pox 96% on RA  CXR IMPRESSION: 1. Questionable nodular density right upper lung. Repeat PA and lateral chest x-ray suggested. If nodular density persist CT of the chest can be obtained to further evaluate.  2.  No acute cardiopulmonary disease.  CT abd/ pelvis IMPRESSION: 1. Open wound involving the perineum and perianal region, with what I believe to be a necrotic mass involving the perineum and perianal region. It is difficult to measure the mass due to the necrosis and the associated post radiation changes in the pelvis. 2. Borderline to mild hepatomegaly.  No no hepatic masses. 3. Indeterminate lucent lesion involving the L3 vertebral body. No focal osseous lesions elsewhere. 4. Mild diffuse bladder wall thickening, query cystitis. 5. Large colonic stool burden. 6. Small amount of stool in the gluteal fold, indicating mild incontinence.  Aortic Atherosclerosis (ICD10-I70.0).  Na 133, K 4.4, Bun 10, Creatinine 0.99 Wbc 12.0, hgb 7.7, Plt 625 Ast 14, Alt 24, Alk phos 103, T. Bili 0.1 Alb 2.2  Urinalysis negative  Lactic acid 3.6  Blood culture x2 pending Type and screen   Pt will be admitted for  hypotension and sepsis (tachycardia, hypotension , elevated lactic acid)      Review of systems:    In addition to the HPI above,  No Fever-chills, No Headache, No changes with Vision or hearing, No problems swallowing food or Liquids, No Chest pain, No cough,  Slight dyspnea No Abdominal pain, No Nausea or Vomiting, bowel movements are regular, No Blood in stool or Urine, No dysuria, No new skin rashes or bruises, No new joints pains-aches,  No new weakness, tingling, numbness in any extremity, No recent weight gain or loss, No polyuria, polydypsia or polyphagia, No significant Mental Stressors.  All other systems reviewed and are negative.    Past History of the following :    Past Medical History:  Diagnosis Date   Cancer ([place]) 2018   anal cancer   S/P radiation therapy 07/07/14-7/20/216   anal ca       History reviewed. No pertinent surgical history. Excision of perianal mass     Social History:      Social History   Tobacco Use   Smoking status: Current Some Day Smoker  Packs/day: 0.30    Types: Cigarettes   Smokeless tobacco: Current User  Substance Use Topics   Alcohol use: No       Family History :     Family History  Problem Relation Age of Onset   Stroke Mother        Home Medications:   Prior to Admission medications   Medication Sig Start Date End Date Taking? Authorizing Provider  MELATONIN PO Take by mouth.   Yes [provider]  oxyCODONE (OXY IR/ROXICODONE) 5 MG immediate release tablet Take 1-2 tablets (5-10 mg total) by mouth every 4 (four) hours as needed for severe pain. Patient taking differently: Take 20 mg by mouth every 4 (four) hours as needed for severe pain.  03/02/15  Yes Hayden Pedro, PA-C     Allergies:    No Known Allergies   Physical Exam:   Vitals  Blood pressure (!) 91/56, pulse 90, temperature 98.2 F (36.8 C), temperature source Oral, resp. rate 18, SpO2 99 %.  1.   General: axoxo3  2. Psychiatric: euthymic  3. Neurologic: cn2-12 intact, reflexes 2+ symmetric, diffuse with no clonus, motor 5/5 in all 4 ext  4. HEENMT:  Anicteric, pupils 1.21m symmetric, direct, consensual, near intact Neck: no jvd  5. Respiratory : CTAB  6. Cardiovascular : rrr s1, s2, no m/g/r  7. Gastrointestinal:  Abd: soft, nt, nd, +bs  8. Skin:  Ext: no c/c/e,  7cm wound, opening perirectally   9.Musculoskeletal:  Good ROM    Data Review:    CBC Recent Labs  Lab 10/17/18 1446 10/18/18 1415  WBC 13.1* 12.0*  HGB 7.4* 7.7*  HCT 23.3* 26.3*  PLT 621* 625*  MCV 62* 67.1*  MCH 19.7* 19.6*  MCHC 31.8 29.3*  RDW 17.2* 16.6*   ------------------------------------------------------------------------------------------------------------------  Results for orders placed or performed during the hospital encounter of 10/18/18 (from the past 48 hour(s))  Basic metabolic panel     Status: Abnormal   Collection Time: 10/18/18  2:15 PM  Result Value Ref Range   Sodium 133 (L) 135 - 145 mmol/L   Potassium 4.4 3.5 - 5.1 mmol/L   Chloride 98 98 - 111 mmol/L   CO2 23 22 - 32 mmol/L   Glucose, Bld 106 (H) 70 - 99 mg/dL   BUN 10 6 - 20 mg/dL   Creatinine, Ser 0.99 0.61 - 1.24 mg/dL   Calcium 9.6 8.9 - 10.3 mg/dL   GFR calc non Af Amer >60 >60 mL/min   GFR calc Af Amer >60 >60 mL/min   Anion gap 12 5 - 15    Comment: Performed at [facility] [address] [place] [id number] CBC     Status: Abnormal   Collection Time: 10/18/18  2:15 PM  Result Value Ref Range   WBC 12.0 (H) 4.0 - 10.5 K/uL   RBC 3.92 (L) 4.22 - 5.81 MIL/uL   Hemoglobin 7.7 (L) 13.0 - 17.0 g/dL    Comment: Reticulocyte Hemoglobin testing may be clinically indicated, consider ordering this additional test [id number]   HCT 26.3 (L) 39.0 - 52.0 %   MCV 67.1 (L) 80.0 - 100.0 fL   MCH 19.6 (L) 26.0 - 34.0 pg   MCHC 29.3 (L) 30.0 - 36.0 g/dL   RDW 16.6 (H) 11.5 - 15.5 %    Platelets 625 (H) 150 - 400 K/uL   nRBC 0.0 0.0 - 0.2 %    Comment: Performed at [facility], [place] [address], [place], [place] [address]  Hepatic function panel     Status: Abnormal   Collection Time: 10/18/18  2:15 PM  Result Value Ref Range   Total Protein 8.2 (H) 6.5 - 8.1 g/dL   Albumin 2.2 (L) 3.5 - 5.0 g/dL   AST 14 (L) 15 - 41 U/L   ALT 24 0 - 44 U/L   Alkaline Phosphatase 103 38 - 126 U/L   Total Bilirubin 0.1 (L) 0.3 - 1.2 mg/dL   Bilirubin, Direct <0.1 0.0 - 0.2 mg/dL   Indirect Bilirubin NOT CALCULATED 0.3 - 0.9 mg/dL    Comment: Performed at [facility] [address] [place], [place] [address]  Urinalysis, Routine w reflex microscopic     Status: Abnormal   Collection Time: 10/18/18  2:25 PM  Result Value Ref Range   Color, Urine YELLOW YELLOW   APPearance CLEAR CLEAR   Specific Gravity, Urine 1.020 1.005 - 1.030   pH 6.0 5.0 - 8.0   Glucose, UA NEGATIVE NEGATIVE mg/dL   Hgb urine dipstick NEGATIVE NEGATIVE   Bilirubin Urine NEGATIVE NEGATIVE   Ketones, ur NEGATIVE NEGATIVE mg/dL   Protein, ur 30 (A) NEGATIVE mg/dL   Nitrite NEGATIVE NEGATIVE   Leukocytes,Ua TRACE (A) NEGATIVE   RBC / HPF 0-5 0 - 5 RBC/hpf   WBC, UA 0-5 0 - 5 WBC/hpf   Bacteria, UA RARE (A) NONE SEEN   Mucus PRESENT     Comment: Performed at [facility], [place] [address], [place], [place] [address]  Lactic acid, plasma     Status: Abnormal   Collection Time: 10/18/18  2:37 PM  Result Value Ref Range   Lactic Acid, Venous 3.6 (HH) 0.5 - 1.9 mmol/L    Comment: CRITICAL RESULT CALLED TO, READ BACK BY AND VERIFIED WITH: Pilar Plate 1516 10/18/2018 D BRADLEY Performed at [facility], [place] [address], [place], [place] [address]   Type and screen     Status: None   Collection Time: 10/18/18  2:37 PM  Result Value Ref Range   ABO/RH(D) O POS    Antibody Screen NEG    Sample Expiration      10/21/2018,2359 Performed at [facility], [place] [address], [place], [place]  [address]   POC occult blood, ED     Status: None   Collection Time: 10/18/18  3:06 PM  Result Value Ref Range   Fecal Occult Bld NEGATIVE NEGATIVE  Vitamin B12     Status: None   Collection Time: 10/18/18  3:30 PM  Result Value Ref Range   Vitamin B-12 357 180 - 914 pg/mL    Comment: (NOTE) This assay is not validated for testing neonatal or myeloproliferative syndrome specimens for Vitamin B12 levels. Performed at [facility], [place] [address], [place], [place] [address]   Iron and TIBC     Status: Abnormal   Collection Time: 10/18/18  3:30 PM  Result Value Ref Range   Iron 11 (L) 45 - 182 ug/dL   TIBC 214 (L) 250 - 450 ug/dL   Saturation Ratios 5 (L) 17.9 - 39.5 %   UIBC 203 ug/dL    Comment: Performed at [facility] [address], [place], [place] [address]  Ferritin     Status: None   Collection Time: 10/18/18  3:30 PM  Result Value Ref Range   Ferritin 25 24 - 336 ng/mL  Comment: Performed at [facility], [place] [address], [place], [place] [address]  Reticulocytes     Status: Abnormal   Collection Time: 10/18/18  3:30 PM  Result Value Ref Range   Retic Ct Pct 1.5 0.4 - 3.1 %   RBC. 3.88 (L) 4.22 - 5.81 MIL/uL   Retic Count, Absolute 58.2 19.0 - 186.0 K/uL   Immature Retic Fract 28.7 (H) 2.3 - 15.9 %    Comment: Performed at [facility] [address], [place], [place] [address]  Folate     Status: None   Collection Time: 10/18/18  3:30 PM  Result Value Ref Range   Folate 9.6 >5.9 ng/mL    Comment: Performed at [facility], [place] [address], [place], [place] [address]  Lactic acid, plasma     Status: None   Collection Time: 10/18/18  5:46 PM  Result Value Ref Range   Lactic Acid, Venous 1.1 0.5 - 1.9 mmol/L    Comment: Performed at [facility] [address], [place], [place] [address]    Chemistries  Recent Labs  Lab 10/17/18 1515 10/18/18 1415  NA 132* 133*  K 4.8 4.4  CL 95* 98  CO2 23 23  GLUCOSE 92 106*  BUN 11  10  CREATININE 0.77 0.99  CALCIUM 9.6 9.6  AST  --  14*  ALT  --  24  ALKPHOS  --  103  BILITOT  --  0.1*   ------------------------------------------------------------------------------------------------------------------  ------------------------------------------------------------------------------------------------------------------ GFR: CrCl cannot be calculated (Unknown ideal weight.). Liver Function Tests: Recent Labs  Lab 10/18/18 1415  AST 14*  ALT 24  ALKPHOS 103  BILITOT 0.1*  PROT 8.2*  ALBUMIN 2.2*   No results for input(s): LIPASE, AMYLASE in the last 168 hours. No results for input(s): AMMONIA in the last 168 hours. Coagulation Profile: No results for input(s): INR, PROTIME in the last 168 hours. Cardiac Enzymes: No results for input(s): CKTOTAL, CKMB, CKMBINDEX, TROPONINI in the last 168 hours. BNP (last 3 results) No results for input(s): PROBNP in the last 8760 hours. HbA1C: No results for input(s): HGBA1C in the last 72 hours. CBG: No results for input(s): GLUCAP in the last 168 hours. Lipid Profile: No results for input(s): CHOL, HDL, LDLCALC, TRIG, CHOLHDL, LDLDIRECT in the last 72 hours. Thyroid Function Tests: Recent Labs    10/17/18 1515  TSH 1.680   Anemia Panel: Recent Labs    10/18/18 1530  VITAMINB12 357  FOLATE 9.6  FERRITIN 25  TIBC 214*  IRON 11*  RETICCTPCT 1.5    --------------------------------------------------------------------------------------------------------------- Urine analysis:    Component Value Date/Time   COLORURINE YELLOW 10/18/2018 1425   APPEARANCEUR CLEAR 10/18/2018 1425   LABSPEC 1.020 10/18/2018 1425   PHURINE 6.0 10/18/2018 1425   GLUCOSEU NEGATIVE 10/18/2018 1425   HGBUR NEGATIVE 10/18/2018 1425   [place] 10/18/2018 1425   [place] 10/18/2018 1425   PROTEINUR 30 (A) 10/18/2018 1425   NITRITE NEGATIVE 10/18/2018 1425   LEUKOCYTESUR TRACE (A) 10/18/2018 1425       Imaging Results:    Ct Abdomen Pelvis W Contrast  Result Date: 10/18/2018 CLINICAL DATA:  48 year old with personal history of Hodgkin's lymphoma, now with 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 He presents now with intermittent rectal bleeding over the past 6  months and a 20 pound weight loss over the past 6 months. He also complains of abdominal distention. EXAM: CT ABDOMEN AND PELVIS WITH CONTRAST TECHNIQUE: Multidetector CT imaging of the abdomen and pelvis was performed using the standard protocol following bolus administration of intravenous contrast. CONTRAST:  148m OMNIPAQUE IOHEXOL 300 MG/ML IV. COMPARISON:  PET-CT 04/27/2010. CT pelvis 03/09/2010. FINDINGS: Lower chest: Respiratory motion blurred images of the lung bases. Visualized lung bases clear. Heart size normal. Hepatobiliary: Liver upper normal in size to perhaps mildly enlarged without focal parenchymal abnormality. Gallbladder contracted without evidence of calcified gallstones. No biliary ductal dilation. Pancreas: Normal in appearance without evidence of mass, ductal dilation, or inflammation. Spleen: Normal in size and appearance. Adrenals/Urinary Tract: Normal appearing adrenal glands. Kidneys normal in size and appearance without focal parenchymal abnormality. No hydronephrosis. No evidence of urinary tract calculi. Mild diffuse bladder wall thickening. Stomach/Bowel: Stomach normal in appearance for the degree of distention. Normal-appearing small bowel. Very large stool burden involving the transverse colon, descending colon, sigmoid colon and rectum. Liquid stool in a relatively decompressed ascending colon. Appendix not visible but no pericecal inflammation. Vascular/Lymphatic: Moderate to severe aorto-iliofemoral atherosclerosis without  evidence of aneurysm. Normal-appearing portal venous and systemic venous systems. Numerous upper normal sized retroperitoneal lymph nodes in the paracaval, LEFT periaortic, LEFT common iliac and RIGHT common iliac stations, not significantly changed since the prior PET-CT. Mildly enlarged gastrohepatic ligament lymph node. No nodal masses Reproductive: Prostate gland and seminal vesicles normal in size and appearance for age. Other: Open wound involving the perineum, with what I believe is a necrotic mass involving the perineum and perianal region. Small amount of stool in the gluteal fold, indicating mild incontinence. Post radiation changes are present in the presacral space and in the LOWER pelvis. It is difficult to measure the mass due to the necrosis and the associated post radiation changes. Musculoskeletal: Subchondral cysts in the UPPER femoral heads bilaterally without evidence of collapse. Lucent lesion involving the L3 vertebral body. No focal osseous lesions elsewhere. IMPRESSION: 1. Open wound involving the perineum and perianal region, with what I believe to be a necrotic mass involving the perineum and perianal region. It is difficult to measure the mass due to the necrosis and the associated post radiation changes in the pelvis. 2. Borderline to mild hepatomegaly.  No no hepatic masses. 3. Indeterminate lucent lesion involving the L3 vertebral body. No focal osseous lesions elsewhere. 4. Mild diffuse bladder wall thickening, query cystitis. 5. Large colonic stool burden. 6. Small amount of stool in the gluteal fold, indicating mild incontinence. Aortic Atherosclerosis (ICD10-I70.0). Electronically Signed   By: TEvangeline DakinM.D.   On: 10/18/2018 19:31   Dg Chest Port 1 View  Result Date: 10/18/2018 CLINICAL DATA:  Weakness. EXAM: PORTABLE CHEST 1 VIEW COMPARISON:  04/29/2010.  04/07/2010. FINDINGS: Mediastinum hilar structures normal. Questionable pulmonary nodule right upper lung. Repeat PA  and lateral chest x-ray suggested. If nodular density persists CT of the chest can be obtained to further evaluate. No pleural effusion or pneumothorax. Heart size normal. Degenerative change thoracic spine. IMPRESSION: 1. Questionable nodular density right upper lung. Repeat PA and lateral chest x-ray suggested. If nodular density persist CT of the chest can be obtained to further evaluate. 2.  No acute cardiopulmonary disease. Electronically Signed   By: TMarcello Moores Register   On: 10/18/2018 15:09   Dg Abd Portable 1 View  Result Date: 10/18/2018 CLINICAL DATA:  Hypotension EXAM: PORTABLE ABDOMEN - 1 VIEW COMPARISON:  None.  FINDINGS: The bowel gas pattern is normal. No radio-opaque calculi or other significant radiographic abnormality are seen. IMPRESSION: Negative. Electronically Signed   By: Constance Holster M.D.   On: 10/18/2018 15:13   ST at 130,     Assessment & Plan:    Principal Problem:   Hypotension Active Problems:   Tachycardia   Anemia   Iron deficiency  Hypotension Tele Trop I q3h x2 Check d dimer, if positive then consider VQ scan or CTA chest Check cortisol Blood cutlure x2 pending Check cardiac echo  ? Sepsis Blood culture x2 vanco iv, zosyn iv pharmacy to dose  Wound (perianal) Wound care Please consult surgery in AM  Anemia (iron deficiency) symptomatic Transfuse 1 unit prbc due to hypotension Check cbc in am  Squamous cell carcinoma rectum/  Nodule density in lung on CXR Please f/u with [facility] oncology  DVT Prophylaxis-   SCDs   AM Labs Ordered, also please review Full Orders  Family Communication: Admission, patients condition and plan of care including tests being ordered have been discussed with the patient  who indicate understanding and agree with the plan and Code Status.  Code Status:  FULL CODE  Admission status:  Inpatient: Based on patients clinical presentation and evaluation of above clinical data, I have made determination that patient meets  Inpatient criteria at this time. Pt is hypotensive and meets sepsis criteria, and will require iv abx as well as iv fluids,  Pt has high risk of clnical deterioration.  Pt will require > 2 nites stay.   Time spent in minutes :  70 minutes   [name] M.D on 10/18/2018 at 9:26 PM

## 2018-10-18 NOTE — ED Notes (Signed)
ED TO INPATIENT HANDOFF REPORT  ED Nurse Name and Phone #:  Marguarite Arbour, RN. [phone]  S Name/Age/Gender Dylan [name] 48 y.o. male Room/Bed: 018C/018C  Code Status   Code Status: Full Code  Home/SNF/Other Home Patient oriented to: self, place, time and situation Is this baseline? Yes   Triage Complete: Triage complete  Chief Complaint sent by dr/low blood count  Triage Note Pt very vague at triage. Reports being sent here due to low bp. Only complaint is fatigue and has hx of cancer.    Allergies No Known Allergies  Level of Care/Admitting Diagnosis ED Disposition    ED Disposition Condition [facility] Area: [facility] [[id number]]  Level of Care: Progressive [102]  Covid Evaluation: Asymptomatic Screening Protocol (No Symptoms)  Diagnosis: Hypotension [id number]  Admitting Physician: [name] [[id number]]  Attending Physician: [name] [phone]  Estimated length of stay: past midnight tomorrow  Certification:: I certify this patient will need inpatient services for at least 2 midnights  PT Class (Do Not Modify): Inpatient [101]  PT Acc Code (Do Not Modify): Private [1]       B Medical/Surgery History Past Medical History:  Diagnosis Date  . Cancer ([place]) 2018   anal cancer  . S/P radiation therapy 07/07/14-7/20/216   anal ca    History reviewed. No pertinent surgical history.   A IV Location/Drains/Wounds Patient Lines/Drains/Airways Status   Active Line/Drains/Airways    Name:   Placement date:   Placement time:   Site:   Days:   Peripheral IV 10/18/18 Left Forearm   10/18/18    1434    Forearm   less than 1   Peripheral IV 10/18/18 Right Antecubital   10/18/18    1434    Antecubital   less than 1          Intake/Output Last 24 hours  Intake/Output Summary (Last 24 hours) at 10/18/2018 2201 Last data filed at 10/18/2018 1731 Gross per 24 hour  Intake 2249.24 ml  Output -  Net 2249.24 ml    Labs/Imaging Results for orders  placed or performed during the hospital encounter of 10/18/18 (from the past 48 hour(s))  Basic metabolic panel     Status: Abnormal   Collection Time: 10/18/18  2:15 PM  Result Value Ref Range   Sodium 133 (L) 135 - 145 mmol/L   Potassium 4.4 3.5 - 5.1 mmol/L   Chloride 98 98 - 111 mmol/L   CO2 23 22 - 32 mmol/L   Glucose, Bld 106 (H) 70 - 99 mg/dL   BUN 10 6 - 20 mg/dL   Creatinine, Ser 0.99 0.61 - 1.24 mg/dL   Calcium 9.6 8.9 - 10.3 mg/dL   GFR calc non Af Amer >60 >60 mL/min   GFR calc Af Amer >60 >60 mL/min   Anion gap 12 5 - 15    Comment: Performed at [facility], [place] [address], [place], [place] [address]  CBC     Status: Abnormal   Collection Time: 10/18/18  2:15 PM  Result Value Ref Range   WBC 12.0 (H) 4.0 - 10.5 K/uL   RBC 3.92 (L) 4.22 - 5.81 MIL/uL   Hemoglobin 7.7 (L) 13.0 - 17.0 g/dL    Comment: Reticulocyte Hemoglobin testing may be clinically indicated, consider ordering this additional test [id number]    HCT 26.3 (L) 39.0 - 52.0 %   MCV 67.1 (L) 80.0 - 100.0 fL   MCH 19.6 (L)  26.0 - 34.0 pg   MCHC 29.3 (L) 30.0 - 36.0 g/dL   RDW 16.6 (H) 11.5 - 15.5 %   Platelets 625 (H) 150 - 400 K/uL   nRBC 0.0 0.0 - 0.2 %    Comment: Performed at [facility] [address], [place], [place] [address]  Hepatic function panel     Status: Abnormal   Collection Time: 10/18/18  2:15 PM  Result Value Ref Range   Total Protein 8.2 (H) 6.5 - 8.1 g/dL   Albumin 2.2 (L) 3.5 - 5.0 g/dL   AST 14 (L) 15 - 41 U/L   ALT 24 0 - 44 U/L   Alkaline Phosphatase 103 38 - 126 U/L   Total Bilirubin 0.1 (L) 0.3 - 1.2 mg/dL   Bilirubin, Direct <0.1 0.0 - 0.2 mg/dL   Indirect Bilirubin NOT CALCULATED 0.3 - 0.9 mg/dL    Comment: Performed at [facility] [address], [place], [place] [address]  Urinalysis, Routine w reflex microscopic     Status: Abnormal   Collection Time: 10/18/18  2:25 PM  Result Value Ref Range   Color, Urine YELLOW YELLOW   APPearance CLEAR  CLEAR   Specific Gravity, Urine 1.020 1.005 - 1.030   pH 6.0 5.0 - 8.0   Glucose, UA NEGATIVE NEGATIVE mg/dL   Hgb urine dipstick NEGATIVE NEGATIVE   Bilirubin Urine NEGATIVE NEGATIVE   Ketones, ur NEGATIVE NEGATIVE mg/dL   Protein, ur 30 (A) NEGATIVE mg/dL   Nitrite NEGATIVE NEGATIVE   Leukocytes,Ua TRACE (A) NEGATIVE   RBC / HPF 0-5 0 - 5 RBC/hpf   WBC, UA 0-5 0 - 5 WBC/hpf   Bacteria, UA RARE (A) NONE SEEN   Mucus PRESENT     Comment: Performed at [facility], [place] [address], [place], [place] [address]  Lactic acid, plasma     Status: Abnormal   Collection Time: 10/18/18  2:37 PM  Result Value Ref Range   Lactic Acid, Venous 3.6 (HH) 0.5 - 1.9 mmol/L    Comment: CRITICAL RESULT CALLED TO, READ BACK BY AND VERIFIED WITH: Pilar Plate 1516 10/18/2018 D BRADLEY Performed at [facility], [place] [address] [place], [place] [address]   Type and screen     Status: None   Collection Time: 10/18/18  2:37 PM  Result Value Ref Range   ABO/RH(D) O POS    Antibody Screen NEG    Sample Expiration      10/21/2018,2359 Performed at [facility], [place] [address], [place], [place] [address]   POC occult blood, ED     Status: None   Collection Time: 10/18/18  3:06 PM  Result Value Ref Range   Fecal Occult Bld NEGATIVE NEGATIVE  Vitamin B12     Status: None   Collection Time: 10/18/18  3:30 PM  Result Value Ref Range   Vitamin B-12 357 180 - 914 pg/mL    Comment: (NOTE) This assay is not validated for testing neonatal or myeloproliferative syndrome specimens for Vitamin B12 levels. Performed at [facility], [place] [address] [place], [place] [address]   Iron and TIBC     Status: Abnormal   Collection Time: 10/18/18  3:30 PM  Result Value Ref Range   Iron 11 (L) 45 - 182 ug/dL   TIBC 214 (L) 250 - 450 ug/dL   Saturation Ratios 5 (L) 17.9 - 39.5 %   UIBC 203 ug/dL  Comment: Performed at [facility], [place] [address] [place], [place] [address]  Ferritin      Status: None   Collection Time: 10/18/18  3:30 PM  Result Value Ref Range   Ferritin 25 24 - 336 ng/mL    Comment: Performed at [facility], [place] [address], [place], [place] [address]  Reticulocytes     Status: Abnormal   Collection Time: 10/18/18  3:30 PM  Result Value Ref Range   Retic Ct Pct 1.5 0.4 - 3.1 %   RBC. 3.88 (L) 4.22 - 5.81 MIL/uL   Retic Count, Absolute 58.2 19.0 - 186.0 K/uL   Immature Retic Fract 28.7 (H) 2.3 - 15.9 %    Comment: Performed at [facility] [address], [place], [place] [address]  Folate     Status: None   Collection Time: 10/18/18  3:30 PM  Result Value Ref Range   Folate 9.6 >5.9 ng/mL    Comment: Performed at [facility], [place] [address], [place], [place] [address]  Lactic acid, plasma     Status: None   Collection Time: 10/18/18  5:46 PM  Result Value Ref Range   Lactic Acid, Venous 1.1 0.5 - 1.9 mmol/L    Comment: Performed at [facility] [address] [place], [place] [address]   Ct Abdomen Pelvis W Contrast  Result Date: 10/18/2018 CLINICAL DATA:  48 year old with personal history of Hodgkin's lymphoma, now with 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 He presents now with intermittent rectal bleeding over the past 6 months and a 20 pound weight loss over the past 6 months. He also complains of abdominal distention. EXAM: CT ABDOMEN AND PELVIS WITH CONTRAST TECHNIQUE: Multidetector CT imaging of the abdomen and pelvis was performed using the standard protocol following bolus administration of intravenous contrast. CONTRAST:  145mL OMNIPAQUE IOHEXOL 300 MG/ML IV. COMPARISON:  PET-CT 04/27/2010. CT pelvis 03/09/2010. FINDINGS: Lower chest: Respiratory motion blurred images of the lung bases. Visualized lung bases clear. Heart size  normal. Hepatobiliary: Liver upper normal in size to perhaps mildly enlarged without focal parenchymal abnormality. Gallbladder contracted without evidence of calcified gallstones. No biliary ductal dilation. Pancreas: Normal in appearance without evidence of mass, ductal dilation, or inflammation. Spleen: Normal in size and appearance. Adrenals/Urinary Tract: Normal appearing adrenal glands. Kidneys normal in size and appearance without focal parenchymal abnormality. No hydronephrosis. No evidence of urinary tract calculi. Mild diffuse bladder wall thickening. Stomach/Bowel: Stomach normal in appearance for the degree of distention. Normal-appearing small bowel. Very large stool burden involving the transverse colon, descending colon, sigmoid colon and rectum. Liquid stool in a relatively decompressed ascending colon. Appendix not visible but no pericecal inflammation. Vascular/Lymphatic: Moderate to severe aorto-iliofemoral atherosclerosis without evidence of aneurysm. Normal-appearing portal venous and systemic venous systems. Numerous upper normal sized retroperitoneal lymph nodes in the paracaval, LEFT periaortic, LEFT common iliac and RIGHT common iliac stations, not significantly changed since the prior PET-CT. Mildly enlarged gastrohepatic ligament lymph node. No nodal masses Reproductive: Prostate gland and seminal vesicles normal in size and appearance for age. Other: Open wound involving the perineum, with what I believe is a necrotic mass involving the perineum and perianal region. Small amount of stool in the gluteal fold, indicating mild incontinence. Post radiation changes are present in the  presacral space and in the LOWER pelvis. It is difficult to measure the mass due to the necrosis and the associated post radiation changes. Musculoskeletal: Subchondral cysts in the UPPER femoral heads bilaterally without evidence of collapse. Lucent lesion involving the L3 vertebral body. No focal osseous lesions  elsewhere. IMPRESSION: 1. Open wound involving the perineum and perianal region, with what I believe to be a necrotic mass involving the perineum and perianal region. It is difficult to measure the mass due to the necrosis and the associated post radiation changes in the pelvis. 2. Borderline to mild hepatomegaly.  No no hepatic masses. 3. Indeterminate lucent lesion involving the L3 vertebral body. No focal osseous lesions elsewhere. 4. Mild diffuse bladder wall thickening, query cystitis. 5. Large colonic stool burden. 6. Small amount of stool in the gluteal fold, indicating mild incontinence. Aortic Atherosclerosis (ICD10-I70.0). Electronically Signed   By: Evangeline Dakin M.D.   On: 10/18/2018 19:31   Dg Chest Port 1 View  Result Date: 10/18/2018 CLINICAL DATA:  Weakness. EXAM: PORTABLE CHEST 1 VIEW COMPARISON:  04/29/2010.  04/07/2010. FINDINGS: Mediastinum hilar structures normal. Questionable pulmonary nodule right upper lung. Repeat PA and lateral chest x-ray suggested. If nodular density persists CT of the chest can be obtained to further evaluate. No pleural effusion or pneumothorax. Heart size normal. Degenerative change thoracic spine. IMPRESSION: 1. Questionable nodular density right upper lung. Repeat PA and lateral chest x-ray suggested. If nodular density persist CT of the chest can be obtained to further evaluate. 2.  No acute cardiopulmonary disease. Electronically Signed   By: Marcello Moores  Register   On: 10/18/2018 15:09   Dg Abd Portable 1 View  Result Date: 10/18/2018 CLINICAL DATA:  Hypotension EXAM: PORTABLE ABDOMEN - 1 VIEW COMPARISON:  None. FINDINGS: The bowel gas pattern is normal. No radio-opaque calculi or other significant radiographic abnormality are seen. IMPRESSION: Negative. Electronically Signed   By: Constance Holster M.D.   On: 10/18/2018 15:13    Pending Labs Unresulted Labs (From admission, onward)    Start     Ordered   10/19/18 0500  Comprehensive metabolic panel   Tomorrow morning,   R     10/18/18 2102   10/19/18 0500  CBC  Tomorrow morning,   R     10/18/18 2102   10/19/18 0500  Sedimentation rate  Tomorrow morning,   R     10/18/18 2139   10/18/18 2140  D-dimer, quantitative (not at [facility])  Add-on,   AD     10/18/18 2139   10/18/18 2101  HIV antibody (Routine Testing)  Once,   STAT     10/18/18 2102   10/18/18 2007  Cortisol  Once,   STAT     10/18/18 2006   10/18/18 1955  SARS CORONAVIRUS 2 (TAT 6-24 HRS) Nasopharyngeal Nasopharyngeal Swab  (Asymptomatic/Tier 2 Patients Labs)  Once,   STAT    Question Answer Comment  Is this test for diagnosis or screening Screening   Symptomatic for COVID-19 as defined by CDC No   Hospitalized for COVID-19 No   Admitted to ICU for COVID-19 No   Previously tested for COVID-19 No   Resident in a congregate (group) care setting No   Employed in healthcare setting No      10/18/18 1954   10/18/18 1417  Blood culture (routine x 2)  BLOOD CULTURE X 2,   STAT     10/18/18 1416   10/18/18 1417  Urine culture  ONCE - STAT,  STAT     10/18/18 1416          Vitals/Pain Today's Vitals   10/18/18 1915 10/18/18 1930 10/18/18 2030 10/18/18 2130  BP: (!) 82/58 (!) 91/56 (!) 86/56 (!) 94/54  Pulse: 100 90 91 93  Resp: (!) 22 18 13 15   Temp:      TempSrc:      SpO2: 100% 99% 98% 98%  Weight:    66.6 kg  Height:    6\' 1"  (1.854 m)  PainSc:        Isolation Precautions No active isolations  Medications Medications  acetaminophen (TYLENOL) tablet 650 mg (has no administration in time range)    Or  acetaminophen (TYLENOL) suppository 650 mg (has no administration in time range)  0.9 %  sodium chloride infusion ( Intravenous New Bag/Given 10/18/18 2128)  sodium chloride flush (NS) 0.9 % injection 3 mL (3 mLs Intravenous Given 10/18/18 1440)  sodium chloride 0.9 % bolus 2,000 mL (0 mLs Intravenous Stopped 10/18/18 1521)  piperacillin-tazobactam (ZOSYN) IVPB 3.375 g (0 g Intravenous Stopped 10/18/18 1552)   vancomycin (VANCOCIN) IVPB 1000 mg/200 mL premix ( Intravenous Stopped 10/18/18 1708)  iohexol (OMNIPAQUE) 300 MG/ML solution 100 mL (100 mLs Intravenous Contrast Given 10/18/18 1749)    Mobility walks with person assist Low fall risk   Focused Assessments gi/gi   R Recommendations: See Admitting Provider Note  Report given to:   Additional Notes:

## 2018-10-18 NOTE — ED Notes (Signed)
Lab reports able to add on extra lab from previous blood draw.

## 2018-10-18 NOTE — ED Provider Notes (Signed)
[facility] EMERGENCY DEPARTMENT Provider Note   CSN: [id number] Arrival date & time: 10/18/18  1352     History   Chief Complaint Chief Complaint  Patient presents with  . Weakness  . Hypotension    HPI Dylan [name] is a 48 y.o. 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 Patient was seen by PCP yesterday and was instructed to present to ED last night to evaluate for need for transfusion given concern for symptomatic anemia with orthostasis, hypotension and continued rectal bleeding; however, due to lethargy patient waited until this afternoon. Patient also noted to have leukocytosis and thrombocytosis on labs from PCP yesterday.He continues to have lethargy and dizziness on standing. He endorses recent BM with hematochezia but denies any nausea/vomiting, abdominal pain, fevers/chills, headaches, or recent URI symptoms.     Past Medical History:  Diagnosis Date  . Cancer ([place]) 2018   anal cancer  . S/P radiation therapy 07/07/14-7/20/216   anal ca     Patient Active Problem List   Diagnosis Date Noted  . Anal cancer ([place]) 02/10/2014     Home Medications    Prior to Admission medications   Medication Sig Start Date End Date Taking? Authorizing Provider  MELATONIN PO Take by mouth.   Yes [provider]  oxyCODONE (OXY IR/ROXICODONE) 5 MG immediate release tablet Take 1-2 tablets (5-10 mg total) by mouth every 4 (four) hours as needed for severe pain. Patient taking differently: Take 20 mg by mouth every 4 (four) hours as needed for severe pain.  03/02/15  Yes Hayden Pedro, PA-C    Family History History reviewed. No  pertinent family history.  Social History Social History   Tobacco Use  . Smoking status: Current Some Day Smoker    Packs/day: 0.30    Types: Cigarettes  . Smokeless tobacco: Current User  Substance Use Topics  . Alcohol use: No  . Drug use: Not on file     Allergies   Patient has no known allergies.   Review of Systems Review of Systems  Constitutional: Positive for malaise/fatigue and weight loss. Negative for chills, diaphoresis and fever.  HENT: Negative for congestion, sinus pain and sore throat.   Eyes: Negative.  Negative for blurred vision and double vision.  Respiratory: Negative for cough, shortness of breath and wheezing.   Cardiovascular: Negative for chest pain and palpitations.  Gastrointestinal: Positive for blood in stool. Negative for abdominal pain, constipation, diarrhea, melena, nausea and vomiting.  Endocrine: Negative.   Genitourinary: Negative.   Musculoskeletal: Negative.   Allergic/Immunologic: Negative.   Neurological: Positive for dizziness. Negative for tingling, sensory change, focal weakness and headaches.  Hematological: Negative.    Physical Exam Updated Vital Signs BP 95/63   Pulse (!) 101   Temp 98.2 F (36.8 C) (Oral)   Resp 13   SpO2 100%   Physical Exam Vitals signs reviewed.  Constitutional:      General: He is not in acute distress.    Appearance: He is ill-appearing. He is not diaphoretic.  HENT:     Head: Normocephalic and atraumatic.     Mouth/Throat:     Mouth: Mucous membranes are dry.     Pharynx:  Oropharynx is clear. No oropharyngeal exudate or posterior oropharyngeal erythema.  Eyes:     Extraocular Movements: Extraocular movements intact.  Neck:     Musculoskeletal: Normal range of motion and neck supple. No neck rigidity.  Cardiovascular:     Rate and Rhythm: Regular rhythm. Tachycardia present.     Pulses: Normal pulses.     Heart sounds: No murmur. No friction rub. No gallop.   Pulmonary:     Effort:  Pulmonary effort is normal. No respiratory distress.     Breath sounds: Normal breath sounds. No stridor. No wheezing, rhonchi or rales.  Abdominal:     General: Bowel sounds are normal. There is no distension.     Palpations: Abdomen is soft.     Tenderness: There is no abdominal tenderness. There is no guarding.  Genitourinary:    Comments: Perianal scarring and fluctuance with stage 3 decubitus ulcer  Musculoskeletal: Normal range of motion.  Lymphadenopathy:     Cervical: No cervical adenopathy.  Skin:    General: Skin is warm and dry.     Capillary Refill: Capillary refill takes less than 2 seconds.  Neurological:     General: No focal deficit present.     Mental Status: He is alert and oriented to person, place, and time. Mental status is at baseline.     Cranial Nerves: No cranial nerve deficit.     Motor: Weakness present.     ED Treatments / Results  Labs (all labs ordered are listed, but only abnormal results are displayed) Labs Reviewed  BASIC METABOLIC PANEL - Abnormal; Notable for the following components:      Result Value   Sodium 133 (*)    Glucose, Bld 106 (*)    All other components within normal limits  CBC - Abnormal; Notable for the following components:   WBC 12.0 (*)    RBC 3.92 (*)    Hemoglobin 7.7 (*)    HCT 26.3 (*)    MCV 67.1 (*)    MCH 19.6 (*)    MCHC 29.3 (*)    RDW 16.6 (*)    Platelets 625 (*)    All other components within normal limits  URINALYSIS, ROUTINE W REFLEX MICROSCOPIC - Abnormal; Notable for the following components:   Protein, ur 30 (*)    Leukocytes,Ua TRACE (*)    Bacteria, UA RARE (*)    All other components within normal limits  HEPATIC FUNCTION PANEL - Abnormal; Notable for the following components:   Total Protein 8.2 (*)    Albumin 2.2 (*)    AST 14 (*)    Total Bilirubin 0.1 (*)    All other components within normal limits  LACTIC ACID, PLASMA - Abnormal; Notable for the following components:   Lactic Acid,  Venous 3.6 (*)    All other components within normal limits  CULTURE, BLOOD (ROUTINE X 2)  CULTURE, BLOOD (ROUTINE X 2)  URINE CULTURE  LACTIC ACID, PLASMA  VITAMIN B12  FOLATE  IRON AND TIBC  FERRITIN  RETICULOCYTES  POC OCCULT BLOOD, ED  TYPE AND SCREEN    EKG EKG Interpretation  Date/Time:  Thursday October 18 2018 14:19:05 EDT Ventricular Rate:  131 PR Interval:  120 QRS Duration: 78 QT Interval:  304 QTC Calculation: 448 R Axis:   89 Text Interpretation:  Sinus tachycardia Right atrial enlargement Borderline ECG Since last tracing rate faster Confirmed by Wandra Arthurs [phone]) on 10/18/2018 2:20:07 PM   Radiology Dg Chest  Port 1 View  Result Date: 10/18/2018 CLINICAL DATA:  Weakness. EXAM: PORTABLE CHEST 1 VIEW COMPARISON:  04/29/2010.  04/07/2010. FINDINGS: Mediastinum hilar structures normal. Questionable pulmonary nodule right upper lung. Repeat PA and lateral chest x-ray suggested. If nodular density persists CT of the chest can be obtained to further evaluate. No pleural effusion or pneumothorax. Heart size normal. Degenerative change thoracic spine. IMPRESSION: 1. Questionable nodular density right upper lung. Repeat PA and lateral chest x-ray suggested. If nodular density persist CT of the chest can be obtained to further evaluate. 2.  No acute cardiopulmonary disease. Electronically Signed   By: Marcello Moores  Register   On: 10/18/2018 15:09   Dg Abd Portable 1 View  Result Date: 10/18/2018 CLINICAL DATA:  Hypotension EXAM: PORTABLE ABDOMEN - 1 VIEW COMPARISON:  None. FINDINGS: The bowel gas pattern is normal. No radio-opaque calculi or other significant radiographic abnormality are seen. IMPRESSION: Negative. Electronically Signed   By: Constance Holster M.D.   On: 10/18/2018 15:13    Procedures Procedures (including critical care time)  Medications Ordered in ED Medications  piperacillin-tazobactam (ZOSYN) IVPB 3.375 g (3.375 g Intravenous New Bag/Given 10/18/18 1522)   vancomycin (VANCOCIN) IVPB 1000 mg/200 mL premix (1,000 mg Intravenous New Bag/Given 10/18/18 1525)  sodium chloride flush (NS) 0.9 % injection 3 mL (3 mLs Intravenous Given 10/18/18 1440)  sodium chloride 0.9 % bolus 2,000 mL (0 mLs Intravenous Stopped 10/18/18 1521)     Initial Impression / Assessment and Plan / ED Course  I have reviewed the triage vital signs and the nursing notes.  Pertinent labs & imaging results that were available during my care of the patient were reviewed by me and considered in my medical decision making (see chart for details).  Patient with history of invasive squamous cell carcinoma of anus presenting to ED with lethargy, episodic dizziness on standing, intermittent rectal bleeding and ~20lb weight loss since March 2020. Patient was seen yesterday by PCP and recommended for presentation to ED to be evaluated for transfusion with concerns for symptomatic anemia (Hb 7.4), leukocytosis and thrombocytosis. Patient presentation unchanged from yesterday. On examination, patient is ill-appearing and lethargic but not in any acute distress. He has a stage 3 decubitus ulcer vs perianal abscess given fluctuance with perianal scarring. Occult blood negative. Patient started on 2L boluses for hypotension and tachycardia with leukocytosis and lactic acidosis on labs. Patient also started on empiric abx.  Abdominal X-ray negative for any acute process. CXR with no acute cardiopulmonary process. UA negative for UTI.  CT Abdomen/pelvis ordered.   Patient signed out to oncoming physician for further management and possible admission.   Final Clinical Impressions(s) / ED Diagnoses   Final diagnoses:  None    ED Discharge Orders    None       Harvie Heck, MD 10/18/18 1545    Drenda Freeze, MD 10/20/18 [phone]

## 2018-10-18 NOTE — ED Notes (Signed)
Patient transported to CT 

## 2018-10-18 NOTE — Progress Notes (Signed)
Pharmacy Antibiotic Note  Dylan [name] is a 48 y.o. male admitted on 10/18/2018 with sepsis.  Pharmacy has been consulted for vancomycin/zosyn dosing. Afebrile, WBC 12, LA 3.6>1.1. SCr 0.99 on admit.  Patient already received 1x doses of Zosyn 3.375g IV (43min infusion) and vancomycin 1g IV earlier this afternoon in the ER.  Plan: Continue Zosyn 3.375g IV q8h (4h infusion) Continue vancomycin 1750 mg IV Q 24 hrs Goal AUC 400-550. Expected AUC: 492 SCr used: 0.99 Monitor clinical progress, c/s, renal function F/u de-escalation plan/LOT, vancomycin levels as indicated      Temp (24hrs), Avg:98.2 F (36.8 C), Min:98.2 F (36.8 C), Max:98.2 F (36.8 C)  Recent Labs  Lab 10/17/18 1446 10/17/18 1515 10/18/18 1415 10/18/18 1437 10/18/18 1746  WBC 13.1*  --  12.0*  --   --   CREATININE  --  0.77 0.99  --   --   LATICACIDVEN  --   --   --  3.6* 1.1    CrCl cannot be calculated (Unknown ideal weight.).    No Known Allergies  Antimicrobials this admission: 9/3 vancomycin >>  9/3 zosyn >>   Dose adjustments this admission:   Microbiology results:   Elicia Lamp, PharmD, BCPS Clinical Pharmacist 10/18/2018 9:45 PM

## 2018-10-19 ENCOUNTER — Inpatient Hospital Stay (HOSPITAL_COMMUNITY): Payer: Medicare Other

## 2018-10-19 DIAGNOSIS — A419 Sepsis, unspecified organism: Principal | ICD-10-CM

## 2018-10-19 DIAGNOSIS — E611 Iron deficiency: Secondary | ICD-10-CM

## 2018-10-19 DIAGNOSIS — I959 Hypotension, unspecified: Secondary | ICD-10-CM

## 2018-10-19 LAB — COMPREHENSIVE METABOLIC PANEL
ALT: 19 U/L (ref 0–44)
AST: 11 U/L — ABNORMAL LOW (ref 15–41)
Albumin: 1.7 g/dL — ABNORMAL LOW (ref 3.5–5.0)
Alkaline Phosphatase: 84 U/L (ref 38–126)
Anion gap: 8 (ref 5–15)
BUN: 8 mg/dL (ref 6–20)
CO2: 21 mmol/L — ABNORMAL LOW (ref 22–32)
Calcium: 8.5 mg/dL — ABNORMAL LOW (ref 8.9–10.3)
Chloride: 104 mmol/L (ref 98–111)
Creatinine, Ser: 0.82 mg/dL (ref 0.61–1.24)
GFR calc Af Amer: >60 mL/min (ref >60)
GFR calc non Af Amer: >60 mL/min (ref >60)
Glucose, Bld: 107 mg/dL — ABNORMAL HIGH (ref 70–99)
Potassium: 3.7 mmol/L (ref 3.5–5.1)
Sodium: 133 mmol/L — ABNORMAL LOW (ref 135–145)
Total Bilirubin: 0.1 mg/dL — ABNORMAL LOW (ref 0.3–1.2)
Total Protein: 6.8 g/dL (ref 6.5–8.1)

## 2018-10-19 LAB — CBC
HCT: 21.8 % — ABNORMAL LOW (ref 39.0–52.0)
Hemoglobin: 6.5 g/dL — CL (ref 13.0–17.0)
MCH: 19.9 pg — ABNORMAL LOW (ref 26.0–34.0)
MCHC: 29.8 g/dL — ABNORMAL LOW (ref 30.0–36.0)
MCV: 66.9 fL — ABNORMAL LOW (ref 80.0–100.0)
Platelets: 533 10*3/uL — ABNORMAL HIGH (ref 150–400)
RBC: 3.26 MIL/uL — ABNORMAL LOW (ref 4.22–5.81)
RDW: 16.3 % — ABNORMAL HIGH (ref 11.5–15.5)
WBC: 9.7 10*3/uL (ref 4.0–10.5)
nRBC: 0 % (ref 0.0–0.2)

## 2018-10-19 LAB — HEMOGLOBIN AND HEMATOCRIT, BLOOD
HCT: 24.4 % — ABNORMAL LOW (ref 39.0–52.0)
Hemoglobin: 7.6 g/dL — ABNORMAL LOW (ref 13.0–17.0)

## 2018-10-19 LAB — URINALYSIS, ROUTINE W REFLEX MICROSCOPIC
Bacteria, UA: NONE SEEN
Bilirubin Urine: NEGATIVE
Glucose, UA: NEGATIVE mg/dL
Hgb urine dipstick: NEGATIVE
Ketones, ur: NEGATIVE mg/dL
Nitrite: NEGATIVE
Protein, ur: NEGATIVE mg/dL
Specific Gravity, Urine: 1.013 (ref 1.005–1.030)
pH: 6 (ref 5.0–8.0)

## 2018-10-19 LAB — URINE CULTURE

## 2018-10-19 LAB — HIV ANTIBODY (ROUTINE TESTING W REFLEX): HIV Screen 4th Generation wRfx: NONREACTIVE

## 2018-10-19 LAB — ECHOCARDIOGRAM COMPLETE
Height: 73 in
Weight: 2363.33 oz

## 2018-10-19 LAB — PREPARE RBC (CROSSMATCH)

## 2018-10-19 LAB — SEDIMENTATION RATE: Sed Rate: 101 mm/hr — ABNORMAL HIGH (ref 0–16)

## 2018-10-19 LAB — MRSA PCR SCREENING: MRSA by PCR: NEGATIVE

## 2018-10-19 LAB — SARS CORONAVIRUS 2 (TAT 6-24 HRS): SARS Coronavirus 2: NEGATIVE

## 2018-10-19 LAB — TROPONIN I (HIGH SENSITIVITY): Troponin I (High Sensitivity): 4 ng/L (ref <18)

## 2018-10-19 MED ORDER — SODIUM CHLORIDE 0.9 % IV SOLN
INTRAVENOUS | Status: DC
  Administered 2018-10-19 – 2018-10-20 (×2): via INTRAVENOUS

## 2018-10-19 MED ORDER — POLYETHYLENE GLYCOL 3350 17 G PO PACK
17.0000 g | PACK | Freq: Two times a day (BID) | ORAL | Status: DC
  Administered 2018-10-19 (×2): 17 g via ORAL
  Filled 2018-10-19 (×3): qty 1

## 2018-10-19 MED ORDER — ADULT MULTIVITAMIN W/MINERALS CH
1.0000 | ORAL_TABLET | Freq: Every day | ORAL | Status: DC
  Administered 2018-10-19 – 2018-10-21 (×3): 1 via ORAL
  Filled 2018-10-19 (×3): qty 1

## 2018-10-19 MED ORDER — SODIUM CHLORIDE 0.9% IV SOLUTION
Freq: Once | INTRAVENOUS | Status: AC
  Administered 2018-10-19: 06:00:00 via INTRAVENOUS

## 2018-10-19 MED ORDER — OXYCODONE HCL 5 MG PO TABS
10.0000 mg | ORAL_TABLET | Freq: Three times a day (TID) | ORAL | Status: DC | PRN
  Administered 2018-10-19: 10 mg via ORAL
  Filled 2018-10-19: qty 2

## 2018-10-19 MED ORDER — SENNOSIDES-DOCUSATE SODIUM 8.6-50 MG PO TABS
1.0000 | ORAL_TABLET | Freq: Two times a day (BID) | ORAL | Status: DC
  Administered 2018-10-19 (×2): 1 via ORAL
  Filled 2018-10-19 (×3): qty 1

## 2018-10-19 MED ORDER — FERROUS SULFATE 325 (65 FE) MG PO TABS
325.0000 mg | ORAL_TABLET | Freq: Every day | ORAL | Status: DC
  Administered 2018-10-20 – 2018-10-21 (×2): 325 mg via ORAL
  Filled 2018-10-19 (×2): qty 1

## 2018-10-19 MED ORDER — DIPHENHYDRAMINE HCL 25 MG PO CAPS
25.0000 mg | ORAL_CAPSULE | Freq: Once | ORAL | Status: AC
  Administered 2018-10-19: 25 mg via ORAL
  Filled 2018-10-19: qty 1

## 2018-10-19 MED ORDER — ACETAMINOPHEN 325 MG PO TABS
650.0000 mg | ORAL_TABLET | Freq: Once | ORAL | Status: AC
  Administered 2018-10-19: 06:00:00 650 mg via ORAL
  Filled 2018-10-19: qty 2

## 2018-10-19 NOTE — Progress Notes (Signed)
CRITICAL VALUE ALERT  Critical Value:  Hgb of 6.5  Date & Time Notied: 10/19/18   0420  Provider Notified:Np Schorr  Orders Received/Actions taken:  No new orders . I unit PRBCs was  preordered

## 2018-10-19 NOTE — Progress Notes (Signed)
   10/18/18 2311  Vitals  Temp 98.1 F (36.7 C)  Temp Source Oral  BP 101/65  MAP (mmHg) 75  BP Location Left Arm  BP Method Automatic  Patient Position (if appropriate) Lying  Pulse Rate 97  Pulse Rate Source Monitor  ECG Heart Rate 98  Resp 14  Oxygen Therapy  SpO2 99 %  O2 Device Room Air  Pain Assessment  Pain Scale 0-10  Pain Score 0  Height and Weight  Height 6\' 1"  (1.854 m)  Weight 67 kg  Type of Scale Used Standing  Type of Weight Actual  BSA (Calculated - sq m) 1.86 sq meters  BMI (Calculated) 19.49  Weight in (lb) to have BMI = 25 189.1  MEWS Score  MEWS RR 0  MEWS Pulse 0  MEWS Systolic 0  MEWS LOC 0  MEWS Temp 0  MEWS Score 0  MEWS Score Color Green    Assumed care of pt to 5W Rm 01. Pt was Oriented to unit and fall education completed.  Pt able to verbalize understanding to risks associated with falls. Skin assessed with Concha Pyo RN. Pt with unstageable open wound to perianal/ perineum area . To continue to monitor and treat pt per  MD an dnursing orders

## 2018-10-19 NOTE — Progress Notes (Signed)
Initial Nutrition Assessment  DOCUMENTATION CODES:   Non-severe (moderate) malnutrition in context of chronic illness  INTERVENTION:   -Ensure Enlive po BID, each supplement provides 350 kcal and 20 grams of protein -MVI with minerals daily -Magic cup TID with meals, each supplement provides 290 kcal and 9 grams of protein  NUTRITION DIAGNOSIS:   Moderate Malnutrition related to chronic illness(non-hodgkin's lymphoma, rectal cancer) as evidenced by percent weight loss, mild fat depletion, moderate fat depletion, mild muscle depletion, moderate muscle depletion.  GOAL:   Patient will meet greater than or equal to 90% of their needs  MONITOR:   PO intake, Supplement acceptance, Labs, Weight trends, Skin, I & O's  REASON FOR ASSESSMENT:   Malnutrition Screening Tool    ASSESSMENT:   Dylan [name]  is a 48 y.o. male,  w hx of rectal cancer (squamous cell)  S/p resection 09/28/2010 w deep abscess , and h/o non-hodgkins lymphoma dx1999, smoker, following with palliative care at [facility] presents w hypotension.  Pt admitted with hypotension.   Reviewed I/O's: -1.3 L x 24 hours  UOP: 4.1 L x 24 hours  Per [facility]OCN note, pt with chronic non-healing surgical perineal wound.   Spoke with pt at bedside, who reports he has experienced a general decline in health over the past 3 months, including perineal wound, decreased appetite, and weight loss. Pt reports he consumed about 75% of his breakfast (50% of pancakes, 100% of eggs, 75% of muffin). Pt consumes 3 meals per day PTA (Breakfast: cereal and Ensure shake; Lunch: value meal from [facility], and Dinner: [place] food (shrimp fried rice).   Pt reports that his UBW is around 180#, which he last weighed around 3 months ago. Per [facility] records, pt weighed 165# in 04/23/18; pt has experienced a 10.7% wt loss over the past 6 months, which is significant for time frame.   Pt reports perineal wound has been present since 3 months ago, however, denies  any further interventions and supplements. Discussed importance of good meal and supplement intake to promote healing. Pt amenable to Ensure supplements.   Of note, pt is followed by palliative services at [facility].   Labs reviewed: Na: 133.   NUTRITION - FOCUSED PHYSICAL EXAM:    Most Recent Value  Orbital Region  Moderate depletion  Upper Arm Region  Mild depletion  Thoracic and Lumbar Region  Mild depletion  Buccal Region  Moderate depletion  Temple Region  Severe depletion  Clavicle Bone Region  Moderate depletion  Clavicle and Acromion Bone Region  Moderate depletion  Scapular Bone Region  Moderate depletion  Dorsal Hand  Mild depletion  Patellar Region  Moderate depletion  Anterior Thigh Region  Moderate depletion  Posterior Calf Region  Moderate depletion  Edema (RD Assessment)  None  Hair  Reviewed  Eyes  Reviewed  Mouth  Reviewed  Skin  Reviewed  Nails  Reviewed       Diet Order:   Diet Order            Diet regular Room service appropriate? Yes; Fluid consistency: Thin  Diet effective now              EDUCATION NEEDS:   Education needs have been addressed  Skin:  Skin Assessment: Skin Integrity Issues: Skin Integrity Issues:: Unstageable Unstageable: perineum  Last BM:  Unknown  Height:   Ht Readings from Last 1 Encounters:  10/18/18 6\' 1"  (1.854 m)    Weight:   Wt Readings from Last 1 Encounters:  10/19/18 67 kg    Ideal Body Weight:  83.6 kg  BMI:  Body mass index is 19.49 kg/m.  Estimated Nutritional Needs:   Kcal:  2100-2300  Protein:  115-130 grams  Fluid:  > 2.1 L    Brooks Stotz A. Jimmye Norman, RD, LDN, [place] Registered Dietitian II Certified Diabetes Care and Education Specialist Pager: [phone] After hours Pager: [phone]

## 2018-10-19 NOTE — Progress Notes (Signed)
  2D Echocardiogram has been performed.  Dylan [name] 10/19/2018, 8:48 AM

## 2018-10-19 NOTE — Progress Notes (Signed)
Rehab Admissions Coordinator Note:  Per PT recommendation, patient was screened by Michel Santee for appropriateness for an Inpatient Acute Rehab Consult. Note pt currently min assist x100' and expect patient will continue to progress quickly if also ambulating with nursing.  At this time, we are recommending HH.  Michel Santee 10/19/2018, 4:51 PM  I can be reached at [phone].

## 2018-10-19 NOTE — Evaluation (Signed)
Physical Therapy Evaluation Patient Details Name: Dylan [name] MRN: [id number] DOB: [birth year] Today's Date: 10/19/2018   History of Present Illness  48 yo male with onset of recurrent scrotal and rectal skin CA was admitted to [facility].   Pt is also noted to have L3 nodule and R lung nodule.  Has UTI suspected, has hypotension, tachycardia and has been transfused.  PMHx:  atherosclerosis, non Hodgkin's lymphoma, rectal and scrotal CA,   Clinical Impression  Pt has arrived to hosp with recurrent cancer of rectum and scrotum, very carefully sitting bedside once up.  PT has talked with pt about his strength, and pt may elect to go home from hosp in spite of his expectations due to being home with family.  Pt is not able to walk alone and recommend nursing take him for walks with his RW to increase his tolerance to balance and control direction and obstacle avoidance.    Follow Up Recommendations CIR    Equipment Recommendations  Rolling walker with 5" wheels    Recommendations for Other Services Rehab consult     Precautions / Restrictions Precautions Precautions: Fall Precaution Comments: monitor lines Restrictions Weight Bearing Restrictions: No      Mobility  Bed Mobility Overal bed mobility: Needs Assistance Bed Mobility: Sidelying to Sit;Rolling;Sit to Sidelying Rolling: Min guard Sidelying to sit: Mod assist     Sit to sidelying: Min assist    Transfers Overall transfer level: Needs assistance Equipment used: Rolling walker (2 wheeled);1 person hand held assist Transfers: Sit to/from Stand Sit to Stand: Min assist            Ambulation/Gait Ambulation/Gait assistance: Min assist Gait Distance (Feet): 100 Feet Assistive device: Rolling walker (2 wheeled);1 person hand held assist Gait Pattern/deviations: Step-through pattern;Decreased stride length;Wide base of support     General Gait Details: walking on hallway with RW, demonstrating a control of balance with  walker  Stairs            Wheelchair Mobility    Modified Rankin (Stroke Patients Only)       Balance Overall balance assessment: Needs assistance Sitting-balance support: Bilateral upper extremity supported;Feet supported Sitting balance-Leahy Scale: Fair   Postural control: Posterior lean Standing balance support: Bilateral upper extremity supported;During functional activity Standing balance-Leahy Scale: Poor Standing balance comment: requires RW for stability                             Pertinent Vitals/Pain Pain Assessment: No/denies pain    Home Living Family/patient expects to be discharged to:: Private residence Living Arrangements: Children Available Help at Discharge: Family;Available 24 hours/day Type of Home: House Home Access: Stairs to enter Entrance Stairs-Rails: None Entrance Stairs-Number of Steps: 2 Home Layout: One level Home Equipment: None Additional Comments: has been independent and not usign RW    Prior Function Level of Independence: Independent               Hand Dominance   Dominant Hand: Right    Extremity/Trunk Assessment   Upper Extremity Assessment Upper Extremity Assessment: Overall WFL for tasks assessed    Lower Extremity Assessment Lower Extremity Assessment: Generalized weakness    Cervical / Trunk Assessment Cervical / Trunk Assessment: Other exceptions(L3 nodule)  Communication   Communication: No difficulties  Cognition Arousal/Alertness: Awake/alert Behavior During Therapy: WFL for tasks assessed/performed Overall Cognitive Status: Within Functional Limits for tasks assessed  General Comments General comments (skin integrity, edema, etc.): Pt is up to walk with PT and nursing in to observe, note O2 sats are steady at 98% with effort.  Has pulses up to 116 with effort    Exercises     Assessment/Plan    PT Assessment Patient needs  continued PT services  PT Problem List Decreased strength;Decreased range of motion;Decreased activity tolerance;Decreased balance;Decreased mobility;Decreased coordination;Decreased cognition;Decreased knowledge of use of DME       PT Treatment Interventions DME instruction;Gait training;Stair training;Functional mobility training;Therapeutic activities;Therapeutic exercise;Balance training;Neuromuscular re-education;Patient/family education    PT Goals (Current goals can be found in the Care Plan section)  Acute Rehab PT Goals Patient Stated Goal: to walk and feel better PT Goal Formulation: With patient Time For Goal Achievement: 11/02/18 Potential to Achieve Goals: Good    Frequency Min 3X/week   Barriers to discharge Inaccessible home environment;Decreased caregiver support pt has stairs wtih no rails and unclear how much help daughter can be    Co-evaluation               AM-PAC PT "6 Clicks" Mobility  Outcome Measure Help needed turning from your back to your side while in a flat bed without using bedrails?: None Help needed moving from lying on your back to sitting on the side of a flat bed without using bedrails?: A Little Help needed moving to and from a bed to a chair (including a wheelchair)?: A Little Help needed standing up from a chair using your arms (e.g., wheelchair or bedside chair)?: A Little Help needed to walk in hospital room?: A Little Help needed climbing 3-5 steps with a railing? : A Lot 6 Click Score: 18    End of Session Equipment Utilized During Treatment: Gait belt Activity Tolerance: Patient tolerated treatment well;Patient limited by fatigue Patient left: in bed;with call bell/phone within reach;with bed alarm set Nurse Communication: Mobility status PT Visit Diagnosis: Unsteadiness on feet (R26.81);Muscle weakness (generalized) (M62.81);Difficulty in walking, not elsewhere classified (R26.2)    Time: [id number] PT Time Calculation (min)  (ACUTE ONLY): 19 min   Charges:   PT Evaluation $PT Eval Moderate Complexity: 1 Mod          Ramond Dial 10/19/2018, 4:47 PM   Mee Hives, PT MS Acute Rehab Dept. Number: [place] and [place]

## 2018-10-19 NOTE — Progress Notes (Signed)
PROGRESS NOTE  Dylan [name] [id number] DOB: Jul 24, 1970 DOA: 10/18/2018 PCP: [name], MD  HPI/Recap of past 24 hours: HPI from Dr Shonna Chock  is a 48 y.o. male,w hx of rectal cancer (squamous cell)  S/p resection 09/28/2010 w deep abscess , and h/o non-hodgkins lymphoma dx1999, smoker, following with palliative care at [facility] presents w hypotension. In the ED, patient was noted to be hypotensive and tachycardic, T 98.2, P 134, R 20, Bp 85/64  Pox 96% on RA. CT abd/pelvis showed open wound involving the perineum and perianal region, likely necrotic mass involving the perineum and perianal region. Pt admitted for further management    Today, pt denies any new complaints, just feeling tired. Denies any rectal pain/bleeding, diarrhea, abdominal pain, fever/chills, chest pain, SOB.  Assessment/Plan: Principal Problem:   Hypotension Active Problems:   Tachycardia   Anemia   Iron deficiency   Sepsis ([place])  Sepsis likely 2/2 ??perineal wound On admission, was tachycardic, hypotensive, lactic acidosis Currently afebrile with no leukocytosis LA trended down BC X 2 negative UC showed multiple bacteria with none predominant, repeat pending CXR: No acute cardiopulmonary disease, questionable nodular density right upper lung. Repeat PA and lateral chest x-ray suggested. If nodular density persist CT of the chest can be obtained to further evaluate WOC consulted, appreciate recs Continue IV Vanc, Zosyn Continue IVF Monitor closely  Hypotension Improving Likely 2/2 above Vs intravascular vol depletion Management as above  Symptomatic anemia/iron deficiency/chronic disease Hgb dropped to 6.5 (also dilutional as pt received significant IVF) Transfused 1U of PRBC on 10/19/18 Start Iron supplementation Daily CBC  Rectal squamous cell Ca/Nodular density in lung on CXR Follow up with [facility] oncology Continue chronic pain meds with adequate bowel regimen  Moderate malnutrition  Dietician on board           Malnutrition Type:  Nutrition Problem: Moderate Malnutrition Etiology: chronic illness(non-hodgkin's lymphoma, rectal cancer)   Malnutrition Characteristics:  Signs/Symptoms: percent weight loss, mild fat depletion, moderate fat depletion, mild muscle depletion, moderate muscle depletion Percent weight loss: 10.7 %   Nutrition Interventions:  Interventions: Ensure Enlive (each supplement provides 350kcal and 20 grams of protein), MVI    Estimated body mass index is 19.49 kg/m as calculated from the following:   Height as of this encounter: 6\' 1"  (1.854 m).   Weight as of this encounter: 67 kg.     Code Status: Full   Family Communication: None at bedside  Disposition Plan: TBD   Consultants:  None  Procedures:  None  Antimicrobials:  Vancomycin  Zosyn   DVT prophylaxis: SCDs   Objective: Vitals:   10/19/18 0615 10/19/18 0650 10/19/18 0723 10/19/18 0945  BP: (!) 90/56 (!) 96/54 (!) 90/55 101/68  Pulse: 87 80 85 87  Resp: 12 17 17 15   Temp: 98.2 F (36.8 C) 98.3 F (36.8 C) 98.3 F (36.8 C) 97.7 F (36.5 C)  TempSrc: Oral Oral Oral Oral  SpO2: 100% 99% 99% 99%  Weight:      Height:        Intake/Output Summary (Last 24 hours) at 10/19/2018 1404 Last data filed at 10/19/2018 0945 Gross per 24 hour  Intake 3148.14 ml  Output 4400 ml  Net -1251.86 ml   Filed Weights   10/18/18 2130 10/18/18 2311 10/19/18 0500  Weight: 66.6 kg 67 kg 67 kg    Exam:  General: NAD, chronically ill appearing   Cardiovascular: S1, S2 present  Respiratory: CTAB  Abdomen: Soft, nontender,  nondistended, bowel sounds present  Musculoskeletal: No bilateral pedal edema noted  Skin: Chronic,large wound around perineal area  Psychiatry: Flat affect    Data Reviewed: CBC: Recent Labs  Lab 10/17/18 1446 10/18/18 1415 10/19/18 0224 10/19/18 1317  WBC 13.1* 12.0* 9.7  --   HGB 7.4* 7.7* 6.5* 7.6*  HCT 23.3* 26.3*  21.8* 24.4*  MCV 62* 67.1* 66.9*  --   PLT 621* 625* 533*  --    Basic Metabolic Panel: Recent Labs  Lab 10/17/18 1515 10/18/18 1415 10/19/18 0224  NA 132* 133* 133*  K 4.8 4.4 3.7  CL 95* 98 104  CO2 23 23 21*  GLUCOSE 92 106* 107*  BUN 11 10 8   CREATININE 0.77 0.99 0.82  CALCIUM 9.6 9.6 8.5*   GFR: Estimated Creatinine Clearance: 102.1 mL/min (by C-G formula based on SCr of 0.82 mg/dL). Liver Function Tests: Recent Labs  Lab 10/18/18 1415 10/19/18 0224  AST 14* 11*  ALT 24 19  ALKPHOS 103 84  BILITOT 0.1* 0.1*  PROT 8.2* 6.8  ALBUMIN 2.2* 1.7*   No results for input(s): LIPASE, AMYLASE in the last 168 hours. No results for input(s): AMMONIA in the last 168 hours. Coagulation Profile: No results for input(s): INR, PROTIME in the last 168 hours. Cardiac Enzymes: No results for input(s): CKTOTAL, CKMB, CKMBINDEX, TROPONINI in the last 168 hours. BNP (last 3 results) No results for input(s): PROBNP in the last 8760 hours. HbA1C: No results for input(s): HGBA1C in the last 72 hours. CBG: No results for input(s): GLUCAP in the last 168 hours. Lipid Profile: No results for input(s): CHOL, HDL, LDLCALC, TRIG, CHOLHDL, LDLDIRECT in the last 72 hours. Thyroid Function Tests: Recent Labs    10/17/18 1515  TSH 1.680   Anemia Panel: Recent Labs    10/18/18 1530  VITAMINB12 357  FOLATE 9.6  FERRITIN 25  TIBC 214*  IRON 11*  RETICCTPCT 1.5   Urine analysis:    Component Value Date/Time   COLORURINE YELLOW 10/18/2018 1425   APPEARANCEUR CLEAR 10/18/2018 1425   LABSPEC 1.020 10/18/2018 1425   PHURINE 6.0 10/18/2018 1425   GLUCOSEU NEGATIVE 10/18/2018 1425   HGBUR NEGATIVE 10/18/2018 1425   [place] 10/18/2018 1425   KETONESUR NEGATIVE 10/18/2018 1425   PROTEINUR 30 (A) 10/18/2018 1425   NITRITE NEGATIVE 10/18/2018 1425   LEUKOCYTESUR TRACE (A) 10/18/2018 1425   Sepsis Labs: @LABRCNTIP (procalcitonin:4,lacticidven:4)  ) Recent Results  (from the past 240 hour(s))  Urine culture     Status: None   Collection Time: 10/18/18  2:25 PM   Specimen: Urine, Random  Result Value Ref Range Status   Specimen Description URINE, RANDOM  Final   Special Requests   Final    NONE Performed at [facility], [place] [address], [place], [place] [address]    Culture   Final    Multiple bacterial morphotypes present, none predominant. Suggest appropriate recollection if clinically indicated.   Report Status 10/19/2018 FINAL  Final  Blood culture (routine x 2)     Status: None (Preliminary result)   Collection Time: 10/18/18  2:35 PM   Specimen: BLOOD  Result Value Ref Range Status   Specimen Description BLOOD BLOOD LEFT FOREARM  Final   Special Requests   Final    BOTTLES DRAWN AEROBIC AND ANAEROBIC Blood Culture adequate volume   Culture   Final    NO GROWTH < 24 HOURS Performed at [facility], [place] [address], [place], [place]  [address]    Report Status PENDING  Incomplete  Blood culture (routine x 2)     Status: None (Preliminary result)   Collection Time: 10/18/18  2:37 PM   Specimen: BLOOD  Result Value Ref Range Status   Specimen Description BLOOD RIGHT ANTECUBITAL  Final   Special Requests   Final    BOTTLES DRAWN AEROBIC AND ANAEROBIC Blood Culture results may not be optimal due to an excessive volume of blood received in culture bottles   Culture   Final    NO GROWTH < 24 HOURS Performed at [facility], [place] [address] [place], [place] [address]    Report Status PENDING  Incomplete  SARS CORONAVIRUS 2 (TAT 6-24 HRS) Nasopharyngeal Nasopharyngeal Swab     Status: None   Collection Time: 10/18/18  9:24 PM   Specimen: Nasopharyngeal Swab  Result Value Ref Range Status   SARS Coronavirus 2 NEGATIVE NEGATIVE Final    Comment: (NOTE) SARS-CoV-2 target nucleic acids are NOT DETECTED. The SARS-CoV-2 RNA is generally detectable in upper and lower respiratory specimens during the acute phase of infection.  Negative results do not preclude SARS-CoV-2 infection, do not rule out co-infections with other pathogens, and should not be used as the sole basis for treatment or other patient management decisions. Negative results must be combined with clinical observations, patient history, and epidemiological information. The expected result is Negative. Fact Sheet for Patients: SugarRoll.be Fact Sheet for Healthcare Providers: https://www.woods-mathews.com/ This test is not yet approved or cleared by the [place] FDA and  has been authorized for detection and/or diagnosis of SARS-CoV-2 by FDA under an Emergency Use Authorization (EUA). This EUA will remain  in effect (meaning this test can be used) for the duration of the COVID-19 declaration under Section 56 4(b)(1) of the Act, 21 U.S.C. section 360bbb-3(b)(1), unless the authorization is terminated or revoked sooner. Performed at [facility], [place] [address] [place], [place] [address]   MRSA PCR Screening     Status: None   Collection Time: 10/19/18  7:36 AM   Specimen: Nasal Mucosa; Nasopharyngeal  Result Value Ref Range Status   MRSA by PCR NEGATIVE NEGATIVE Final    Comment:        The GeneXpert MRSA Assay (FDA approved for NASAL specimens only), is one component of a comprehensive MRSA colonization surveillance program. It is not intended to diagnose MRSA infection nor to guide or monitor treatment for MRSA infections. 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  He presents now with intermittent rectal bleeding over the past 6 months and a 20 pound weight loss over the past 6 months. He also complains of abdominal distention. EXAM: CT ABDOMEN AND PELVIS WITH CONTRAST TECHNIQUE: Multidetector CT imaging of the abdomen and pelvis was performed using the standard protocol following bolus administration of intravenous contrast. CONTRAST:  15mL OMNIPAQUE IOHEXOL 300 MG/ML IV. COMPARISON:  PET-CT 04/27/2010. CT pelvis 03/09/2010. FINDINGS: Lower chest: Respiratory motion blurred images of the lung bases. Visualized lung bases clear. Heart size normal. Hepatobiliary: Liver upper normal in size to perhaps mildly enlarged  without focal parenchymal abnormality. Gallbladder contracted without evidence of calcified gallstones. No biliary ductal dilation. Pancreas: Normal in appearance without evidence of mass, ductal dilation, or inflammation. Spleen: Normal in size and appearance. Adrenals/Urinary Tract: Normal appearing adrenal glands. Kidneys normal in size and appearance without focal parenchymal abnormality. No hydronephrosis. No evidence of urinary tract calculi. Mild diffuse bladder wall thickening. Stomach/Bowel: Stomach normal in appearance for the degree of distention. Normal-appearing small bowel. Very large stool burden involving the transverse colon, descending colon, sigmoid colon and rectum. Liquid stool in a relatively decompressed ascending colon. Appendix not visible but no pericecal inflammation. Vascular/Lymphatic: Moderate to severe aorto-iliofemoral atherosclerosis without evidence of aneurysm. Normal-appearing portal venous and systemic venous systems. Numerous upper normal sized retroperitoneal lymph nodes in the paracaval, LEFT periaortic, LEFT common iliac and RIGHT common iliac stations, not significantly changed since the prior PET-CT. Mildly enlarged gastrohepatic ligament lymph node. No nodal masses Reproductive: Prostate gland and seminal vesicles normal  in size and appearance for age. Other: Open wound involving the perineum, with what I believe is a necrotic mass involving the perineum and perianal region. Small amount of stool in the gluteal fold, indicating mild incontinence. Post radiation changes are present in the presacral space and in the LOWER pelvis. It is difficult to measure the mass due to the necrosis and the associated post radiation changes. Musculoskeletal: Subchondral cysts in the UPPER femoral heads bilaterally without evidence of collapse. Lucent lesion involving the L3 vertebral body. No focal osseous lesions elsewhere. IMPRESSION: 1. Open wound involving the perineum and perianal region, with what I believe to be a necrotic mass involving the perineum and perianal region. It is difficult to measure the mass due to the necrosis and the associated post radiation changes in the pelvis. 2. Borderline to mild hepatomegaly.  No no hepatic masses. 3. Indeterminate lucent lesion involving the L3 vertebral body. No focal osseous lesions elsewhere. 4. Mild diffuse bladder wall thickening, query cystitis. 5. Large colonic stool burden. 6. Small amount of stool in the gluteal fold, indicating mild incontinence. Aortic Atherosclerosis (ICD10-I70.0). Electronically Signed   By: Evangeline Dakin M.D.   On: 10/18/2018 19:31   Dg Chest Port 1 View  Result Date: 10/18/2018 CLINICAL DATA:  Weakness. EXAM: PORTABLE CHEST 1 VIEW COMPARISON:  04/29/2010.  04/07/2010. FINDINGS: Mediastinum hilar structures normal. Questionable pulmonary nodule right upper lung. Repeat PA and lateral chest x-ray suggested. If nodular density persists CT of the chest can be obtained to further evaluate. No pleural effusion or pneumothorax. Heart size normal. Degenerative change thoracic spine. IMPRESSION: 1. Questionable nodular density right upper lung. Repeat PA and lateral chest x-ray suggested. If nodular density persist CT of the chest can be obtained to further evaluate. 2.   No acute cardiopulmonary disease. Electronically Signed   By: Marcello Moores  Register   On: 10/18/2018 15:09   Dg Abd Portable 1 View  Result Date: 10/18/2018 CLINICAL DATA:  Hypotension EXAM: PORTABLE ABDOMEN - 1 VIEW COMPARISON:  None. FINDINGS: The bowel gas pattern is normal. No radio-opaque calculi or other significant radiographic abnormality are seen. IMPRESSION: Negative. Electronically Signed   By: Constance Holster M.D.   On: 10/18/2018 15:13    Scheduled Meds: . feeding supplement (ENSURE ENLIVE)  237 mL Oral BID BM  . multivitamin with minerals  1 tablet Oral Daily    Continuous Infusions: . piperacillin-tazobactam (ZOSYN)  IV 3.375 g (10/19/18 0616)  . vancomycin 1,750 mg (10/19/18 1251)     LOS: 1 day  Alma Friendly, MD [facility]  If 7PM-7AM, please contact night-coverage [email or web address] 10/19/2018, 2:04 PM

## 2018-10-19 NOTE — Consult Note (Signed)
[place] Nurse wound consult note Patient receiving care in [facility] 5W01. Reason for Consult: Perineal wound Wound type: Chronic, non-healing surgical wound for cancer Measurement: 13cm x 1.4cm x 8 cm tracks toward the perineal area Wound bed: pink and some yellow deep within the wound bed Drainage (amount, consistency, odor) none Periwound:  intact Dressing procedure/placement/frequency:  Place saline moistened gauze into the buttock/perineal wound. Cover with ABD pads. Tape in place. Monitor the wound area(s) for worsening of condition such as: Signs/symptoms of infection,  Increase in size,  Development of or worsening of odor, Development of pain, or increased pain at the affected locations.  Notify the medical team if any of these develop.  Thank you for the consult.  Discussed plan of care with the patient and bedside nurse.  [facility] nurse will not follow at this time.  Please re-consult the [place] team if needed.  Val Riles, RN, MSN, CWOCN, CNS-BC, pager [phone]

## 2018-10-20 DIAGNOSIS — E43 Unspecified severe protein-calorie malnutrition: Secondary | ICD-10-CM | POA: Insufficient documentation

## 2018-10-20 DIAGNOSIS — E440 Moderate protein-calorie malnutrition: Secondary | ICD-10-CM | POA: Insufficient documentation

## 2018-10-20 LAB — URINE CULTURE: Culture: NO GROWTH

## 2018-10-20 LAB — BASIC METABOLIC PANEL
Anion gap: 8 (ref 5–15)
BUN: <5 mg/dL — ABNORMAL LOW (ref 6–20)
CO2: 22 mmol/L (ref 22–32)
Calcium: 8.4 mg/dL — ABNORMAL LOW (ref 8.9–10.3)
Chloride: 104 mmol/L (ref 98–111)
Creatinine, Ser: 0.79 mg/dL (ref 0.61–1.24)
Glucose, Bld: 102 mg/dL — ABNORMAL HIGH (ref 70–99)
Potassium: 3.4 mmol/L — ABNORMAL LOW (ref 3.5–5.1)
Sodium: 134 mmol/L — ABNORMAL LOW (ref 135–145)

## 2018-10-20 LAB — CBC WITH DIFFERENTIAL/PLATELET
Abs Immature Granulocytes: 0.04 10*3/uL (ref 0.00–0.07)
Basophils Absolute: 0 10*3/uL (ref 0.0–0.1)
Basophils Relative: 0 %
Eosinophils Absolute: 0.2 10*3/uL (ref 0.0–0.5)
Eosinophils Relative: 3 %
HCT: 26.1 % — ABNORMAL LOW (ref 39.0–52.0)
Hemoglobin: 8 g/dL — ABNORMAL LOW (ref 13.0–17.0)
Immature Granulocytes: 1 %
Lymphocytes Relative: 18 %
Lymphs Abs: 1.4 10*3/uL (ref 0.7–4.0)
MCH: 20.7 pg — ABNORMAL LOW (ref 26.0–34.0)
MCHC: 30.7 g/dL (ref 30.0–36.0)
MCV: 67.4 fL — ABNORMAL LOW (ref 80.0–100.0)
Monocytes Absolute: 0.5 10*3/uL (ref 0.1–1.0)
Monocytes Relative: 7 %
Neutro Abs: 5.7 10*3/uL (ref 1.7–7.7)
Neutrophils Relative %: 71 %
Platelets: 553 10*3/uL — ABNORMAL HIGH (ref 150–400)
RBC: 3.87 MIL/uL — ABNORMAL LOW (ref 4.22–5.81)
RDW: 17.8 % — ABNORMAL HIGH (ref 11.5–15.5)
WBC: 8 10*3/uL (ref 4.0–10.5)
nRBC: 0 % (ref 0.0–0.2)

## 2018-10-20 LAB — TYPE AND SCREEN
ABO/RH(D): O POS
Antibody Screen: NEGATIVE
Unit division: 0

## 2018-10-20 LAB — BPAM RBC
Blood Product Expiration Date: 202010062359
ISSUE DATE / TIME: 202009040705
Unit Type and Rh: 5100

## 2018-10-20 MED ORDER — POTASSIUM CHLORIDE CRYS ER 20 MEQ PO TBCR
40.0000 meq | EXTENDED_RELEASE_TABLET | Freq: Once | ORAL | Status: AC
  Administered 2018-10-20: 10:00:00 40 meq via ORAL
  Filled 2018-10-20: qty 2

## 2018-10-20 NOTE — Progress Notes (Signed)
PROGRESS NOTE  Dylan [name] [id number] DOB: 1970-10-03 DOA: 10/18/2018 PCP: [name], MD  HPI/Recap of past 24 hours: HPI from Dr Shonna Chock  is a 48 y.o. male,w hx of rectal cancer (squamous cell)  S/p resection 09/28/2010 w deep abscess , and h/o non-hodgkins lymphoma dx1999, smoker, following with palliative care at [facility] presents w hypotension. In the ED, patient was noted to be hypotensive and tachycardic, T 98.2, P 134, R 20, Bp 85/64  Pox 96% on RA. CT abd/pelvis showed open wound involving the perineum and perianal region, likely necrotic mass involving the perineum and perianal region. Pt admitted for further management    Patient reports feeling better, denies any new complaints.  Blood pressure somewhat improved.  Assessment/Plan: Principal Problem:   Hypotension Active Problems:   Tachycardia   Anemia   Iron deficiency   Sepsis (HCC)   Malnutrition of moderate degree  Sepsis likely 2/2 ??perineal wound On admission, was tachycardic, hypotensive, lactic acidosis Currently afebrile with no leukocytosis LA trended down BC X 2 negative UC showed multiple bacteria with none predominant, repeat pending CXR: No acute cardiopulmonary disease, questionable nodular density right upper lung. Repeat PA and lateral chest x-ray suggested. If nodular density persist CT of the chest can be obtained to further evaluate WOC consulted, appreciate recs Continue IV Zosyn Monitor closely  Hypotension Improving Likely 2/2 above Vs intravascular vol depletion Management as above  Symptomatic anemia/iron deficiency/chronic disease Hgb dropped to 6.5 (also dilutional as pt received significant IVF) Transfused 1U of PRBC on 10/19/18 Start Iron supplementation Daily CBC  Rectal squamous cell Ca/Nodular density in lung on CXR Follow up with [facility] oncology Continue chronic pain meds with adequate bowel regimen  Moderate malnutrition Dietician on board            Malnutrition Type:  Nutrition Problem: Moderate Malnutrition Etiology: chronic illness(non-hodgkin's lymphoma, rectal cancer)   Malnutrition Characteristics:  Signs/Symptoms: percent weight loss, mild fat depletion, moderate fat depletion, mild muscle depletion, moderate muscle depletion Percent weight loss: 10.7 %   Nutrition Interventions:  Interventions: Ensure Enlive (each supplement provides 350kcal and 20 grams of protein), MVI    Estimated body mass index is 21.23 kg/m as calculated from the following:   Height as of this encounter: 6\' 1"  (1.854 m).   Weight as of this encounter: 73 kg.     Code Status: Full   Family Communication: None at bedside  Disposition Plan: TBD   Consultants:  None  Procedures:  None  Antimicrobials:  Zosyn   DVT prophylaxis: SCDs   Objective: Vitals:   10/20/18 0015 10/20/18 0414 10/20/18 0426 10/20/18 0747  BP: (!) 98/59 106/63  97/63  Pulse: 77 65  76  Resp: 14 16  14   Temp: 97.8 F (36.6 C) 97.8 F (36.6 C)  97.8 F (36.6 C)  TempSrc: Oral Oral  Oral  SpO2: 100% 100%  100%  Weight:   73 kg   Height:        Intake/Output Summary (Last 24 hours) at 10/20/2018 1419 Last data filed at 10/20/2018 0600 Gross per 24 hour  Intake 2040.92 ml  Output 775 ml  Net 1265.92 ml   Filed Weights   10/18/18 2311 10/19/18 0500 10/20/18 0426  Weight: 67 kg 67 kg 73 kg    Exam:  General: NAD, chronically ill-appearing  Cardiovascular: S1, S2 present  Respiratory: CTAB  Abdomen: Soft, nontender, nondistended, bowel sounds present  Musculoskeletal: No bilateral pedal edema noted  Skin:  Chronic large wound around perineal area  Psychiatry: Normal mood   Data Reviewed: CBC: Recent Labs  Lab 10/17/18 1446 10/18/18 1415 10/19/18 0224 10/19/18 1317 10/20/18 0219  WBC 13.1* 12.0* 9.7  --  8.0  NEUTROABS  --   --   --   --  5.7  HGB 7.4* 7.7* 6.5* 7.6* 8.0*  HCT 23.3* 26.3* 21.8* 24.4* 26.1*  MCV 62* 67.1*  66.9*  --  67.4*  PLT 621* 625* 533*  --  [id number]   Basic Metabolic Panel: Recent Labs  Lab 10/17/18 1515 10/18/18 1415 10/19/18 0224 10/20/18 0219  NA 132* 133* 133* 134*  K 4.8 4.4 3.7 3.4*  CL 95* 98 104 104  CO2 23 23 21* 22  GLUCOSE 92 106* 107* 102*  BUN 11 10 8  <5*  CREATININE 0.77 0.99 0.82 0.79  CALCIUM 9.6 9.6 8.5* 8.4*   GFR: Estimated Creatinine Clearance: 114.1 mL/min (by C-G formula based on SCr of 0.79 mg/dL). Liver Function Tests: Recent Labs  Lab 10/18/18 1415 10/19/18 0224  AST 14* 11*  ALT 24 19  ALKPHOS 103 84  BILITOT 0.1* 0.1*  PROT 8.2* 6.8  ALBUMIN 2.2* 1.7*   No results for input(s): LIPASE, AMYLASE in the last 168 hours. No results for input(s): AMMONIA in the last 168 hours. Coagulation Profile: No results for input(s): INR, PROTIME in the last 168 hours. Cardiac Enzymes: No results for input(s): CKTOTAL, CKMB, CKMBINDEX, TROPONINI in the last 168 hours. BNP (last 3 results) No results for input(s): PROBNP in the last 8760 hours. HbA1C: No results for input(s): HGBA1C in the last 72 hours. CBG: No results for input(s): GLUCAP in the last 168 hours. Lipid Profile: No results for input(s): CHOL, HDL, LDLCALC, TRIG, CHOLHDL, LDLDIRECT in the last 72 hours. Thyroid Function Tests: Recent Labs    10/17/18 1515  TSH 1.680   Anemia Panel: Recent Labs    10/18/18 1530  VITAMINB12 357  FOLATE 9.6  FERRITIN 25  TIBC 214*  IRON 11*  RETICCTPCT 1.5   Urine analysis:    Component Value Date/Time   COLORURINE STRAW (A) 10/19/2018 1657   APPEARANCEUR CLEAR 10/19/2018 1657   LABSPEC 1.013 10/19/2018 1657   PHURINE 6.0 10/19/2018 1657   GLUCOSEU NEGATIVE 10/19/2018 1657   HGBUR NEGATIVE 10/19/2018 1657   BILIRUBINUR NEGATIVE 10/19/2018 1657   KETONESUR NEGATIVE 10/19/2018 1657   PROTEINUR NEGATIVE 10/19/2018 1657   NITRITE NEGATIVE 10/19/2018 1657   LEUKOCYTESUR TRACE (A) 10/19/2018 1657   Sepsis Labs:  @[email or web address] (procalcitonin:4,lacticidven:4)  ) Recent Results (from the past 240 hour(s))  Urine culture     Status: None   Collection Time: 10/18/18  2:25 PM   Specimen: Urine, Random  Result Value Ref Range Status   Specimen Description URINE, RANDOM  Final   Special Requests   Final    NONE Performed at [facility], [place] [address], [place], [place] [address]    Culture   Final    Multiple bacterial morphotypes present, none predominant. Suggest appropriate recollection if clinically indicated.   Report Status 10/19/2018 FINAL  Final  Blood culture (routine x 2)     Status: None (Preliminary result)   Collection Time: 10/18/18  2:35 PM   Specimen: BLOOD  Result Value Ref Range Status   Specimen Description BLOOD BLOOD LEFT FOREARM  Final   Special Requests   Final    BOTTLES DRAWN AEROBIC AND ANAEROBIC Blood Culture adequate volume   Culture  Final    NO GROWTH 2 DAYS Performed at [facility], [place] [address] [place], [place] [address]    Report Status PENDING  Incomplete  Blood culture (routine x 2)     Status: None (Preliminary result)   Collection Time: 10/18/18  2:37 PM   Specimen: BLOOD  Result Value Ref Range Status   Specimen Description BLOOD RIGHT ANTECUBITAL  Final   Special Requests   Final    BOTTLES DRAWN AEROBIC AND ANAEROBIC Blood Culture results may not be optimal due to an excessive volume of blood received in culture bottles   Culture   Final    NO GROWTH 2 DAYS Performed at [facility], [place] [address] [place], [place] [address]    Report Status PENDING  Incomplete  SARS CORONAVIRUS 2 (TAT 6-24 HRS) Nasopharyngeal Nasopharyngeal Swab     Status: None   Collection Time: 10/18/18  9:24 PM   Specimen: Nasopharyngeal Swab  Result Value Ref Range Status   SARS Coronavirus 2 NEGATIVE NEGATIVE Final    Comment: (NOTE) SARS-CoV-2 target nucleic acids are NOT DETECTED. The SARS-CoV-2 RNA is generally detectable in upper and lower  respiratory specimens during the acute phase of infection. Negative results do not preclude SARS-CoV-2 infection, do not rule out co-infections with other pathogens, and should not be used as the sole basis for treatment or other patient management decisions. Negative results must be combined with clinical observations, patient history, and epidemiological information. The expected result is Negative. Fact Sheet for Patients: SugarRoll.be Fact Sheet for Healthcare Providers: https://www.woods-mathews.com/ This test is not yet approved or cleared by the [place] FDA and  has been authorized for detection and/or diagnosis of SARS-CoV-2 by FDA under an Emergency Use Authorization (EUA). This EUA will remain  in effect (meaning this test can be used) for the duration of the COVID-19 declaration under Section 56 4(b)(1) of the Act, 21 U.S.C. section 360bbb-3(b)(1), unless the authorization is terminated or revoked sooner. Performed at [facility], [place] [address], [place], [place] [address]   MRSA PCR Screening     Status: None   Collection Time: 10/19/18  7:36 AM   Specimen: Nasal Mucosa; Nasopharyngeal  Result Value Ref Range Status   MRSA by PCR NEGATIVE NEGATIVE Final    Comment:        The GeneXpert MRSA Assay (FDA approved for NASAL specimens only), is one component of a comprehensive MRSA colonization surveillance program. It is not intended to diagnose MRSA infection nor to guide or monitor treatment for MRSA infections. Performed at [facility], [place] [address] [place], [place] [address]       Studies: No results found.  Scheduled Meds: . feeding supplement (ENSURE ENLIVE)  237 mL Oral BID BM  . ferrous sulfate  325 mg Oral Q breakfast  . multivitamin with minerals  1 tablet Oral Daily  . polyethylene glycol  17 g Oral BID  . senna-docusate  1 tablet Oral BID    Continuous Infusions: .  piperacillin-tazobactam (ZOSYN)  IV 3.375 g (10/20/18 1415)     LOS: 2 days     Alma Friendly, MD [facility]  If 7PM-7AM, please contact night-coverage [email or web address] 10/20/2018, 2:19 PM

## 2018-10-21 DIAGNOSIS — E440 Moderate protein-calorie malnutrition: Secondary | ICD-10-CM

## 2018-10-21 LAB — CBC WITH DIFFERENTIAL/PLATELET
Abs Immature Granulocytes: 0 10*3/uL (ref 0.00–0.07)
Basophils Absolute: 0 10*3/uL (ref 0.0–0.1)
Basophils Relative: 0 %
Eosinophils Absolute: 0.1 10*3/uL (ref 0.0–0.5)
Eosinophils Relative: 1 %
HCT: 24.6 % — ABNORMAL LOW (ref 39.0–52.0)
Hemoglobin: 7.5 g/dL — ABNORMAL LOW (ref 13.0–17.0)
Lymphocytes Relative: 15 %
Lymphs Abs: 1.2 10*3/uL (ref 0.7–4.0)
MCH: 20.5 pg — ABNORMAL LOW (ref 26.0–34.0)
MCHC: 30.5 g/dL (ref 30.0–36.0)
MCV: 67.2 fL — ABNORMAL LOW (ref 80.0–100.0)
Monocytes Absolute: 1.1 10*3/uL — ABNORMAL HIGH (ref 0.1–1.0)
Monocytes Relative: 13 %
Neutro Abs: 5.8 10*3/uL (ref 1.7–7.7)
Neutrophils Relative %: 71 %
Platelets: 579 10*3/uL — ABNORMAL HIGH (ref 150–400)
RBC: 3.66 MIL/uL — ABNORMAL LOW (ref 4.22–5.81)
RDW: 17.6 % — ABNORMAL HIGH (ref 11.5–15.5)
WBC: 8.2 10*3/uL (ref 4.0–10.5)
nRBC: 0 % (ref 0.0–0.2)
nRBC: 0 /100 WBC

## 2018-10-21 LAB — BASIC METABOLIC PANEL
Anion gap: 9 (ref 5–15)
BUN: <5 mg/dL — ABNORMAL LOW (ref 6–20)
CO2: 22 mmol/L (ref 22–32)
Calcium: 8.4 mg/dL — ABNORMAL LOW (ref 8.9–10.3)
Chloride: 105 mmol/L (ref 98–111)
Creatinine, Ser: 0.83 mg/dL (ref 0.61–1.24)
GFR calc Af Amer: >60 mL/min (ref >60)
GFR calc non Af Amer: >60 mL/min (ref >60)
Glucose, Bld: 79 mg/dL (ref 70–99)
Potassium: 3.7 mmol/L (ref 3.5–5.1)
Sodium: 136 mmol/L (ref 135–145)

## 2018-10-21 MED ORDER — ENSURE ENLIVE PO LIQD: 237.0000 mL | Freq: Two times a day (BID) | ORAL | 0 refills | Status: DC

## 2018-10-21 MED ORDER — AMOXICILLIN-POT CLAVULANATE 875-125 MG PO TABS: 1.0000 | ORAL_TABLET | Freq: Two times a day (BID) | ORAL | 0 refills | Status: AC

## 2018-10-21 MED ORDER — ADULT MULTIVITAMIN W/MINERALS CH: 1.0000 | ORAL_TABLET | Freq: Every day | ORAL | 0 refills | Status: AC

## 2018-10-21 MED ORDER — SENNOSIDES-DOCUSATE SODIUM 8.6-50 MG PO TABS: 1.0000 | ORAL_TABLET | Freq: Every evening | ORAL | 0 refills | Status: AC | PRN

## 2018-10-21 MED ORDER — FERROUS SULFATE 325 (65 FE) MG PO TABS: 325.0000 mg | ORAL_TABLET | Freq: Every day | ORAL | 0 refills | Status: DC

## 2018-10-21 NOTE — Progress Notes (Signed)
Discussed with patient discharge instructions, he verbalized agreement and understanding.  Patient to leave in private vehicle with all belongings.  Philis Fendt, RN, MSN, BC-RN 10/21/2018 11:08 AM

## 2018-10-21 NOTE — TOC Transition Note (Addendum)
Transition of Care [facility]) - CM/SW Discharge Note   Patient Details  Name: Dylan [name] MRN: [id number] Date of Birth: [birth year]  [facility] CM/SW Contact:  Carles Collet, RN Phone Number: 10/21/2018, 11:55 AM   Clinical Narrative:   Patient to DC to home. LIves w daughters. Will have Rose services through [facility]. Patient declined RW. No other CM needs identified.     Final next level of care: [place] Barriers to Discharge: No Barriers Identified   Patient Goals and CMS Choice Patient states their goals for this hospitalization and ongoing recovery are:: to go home CMS Medicare.gov Compare Post Acute Care list provided to:: Patient Choice offered to / list presented to : Patient  Discharge Placement                       Discharge Plan and Services                          HH Arranged: RN, PT, Nurse's Aide [facility] Agency: [place] ([place]) Date [place]: 10/21/18 Time [place]: [id number] Representative spoke with at [place]: [place] ([place]) Interventions     Readmission Risk Interventions No flowsheet data found.

## 2018-10-21 NOTE — Discharge Summary (Signed)
Discharge Summary  Dylan [name] [id number] DOB: 1970/12/16  PCP: [name], MD  Admit date: 10/18/2018 Discharge date: 10/21/2018  Time spent: 35 mins  Recommendations for Outpatient Follow-up:  1. PCP in 1 week with repeat labs 2. Follow-up with his oncologist at [facility]  Discharge Diagnoses:  Active Hospital Problems   Diagnosis Date Noted   Hypotension 10/18/2018   Malnutrition of moderate degree 10/20/2018   Tachycardia 10/18/2018   Anemia 10/18/2018   Iron deficiency 10/18/2018   Sepsis ([place]) 10/18/2018    Resolved Hospital Problems  No resolved problems to display.    Discharge Condition: Stable  Diet recommendation: Regular  Vitals:   10/21/18 0455 10/21/18 0719  BP:  102/61  Pulse:  84  Resp:  19  Temp: 97.6 F (36.4 C)   SpO2:  100%    History of present illness:  [name] a48 y.o.male,w hx of rectal cancer (squamous cell)S/p resection 09/28/2010 w deep abscess, and h/o non-hodgkins lymphomadx1999, smoker, following with palliative care at [facility] presents whypotension. In the ED, patient was noted to be hypotensive and tachycardic, T 98.2, P 134, R 20, Bp 85/64 Pox 96% on RA. CT abd/pelvis showed open wound involving the perineum and perianal region, likely necrotic mass involving the perineum and perianal region. Pt admitted for further management   Patient seen and examined this morning, denies any new complaints.  Eager to be discharged.  Advised to follow-up with PCP in 1 week and also follow-up with his oncologist at [facility].  Hospital Course:  Principal Problem:   Hypotension Active Problems:   Tachycardia   Anemia   Iron deficiency   Sepsis (HCC)   Malnutrition of moderate degree  Sepsis likely 2/2 ??perineal wound On admission, was tachycardic, hypotensive, lactic acidosis Currently afebrile with no leukocytosis LA trended down BC X 2 negative UC showed multiple bacteria with none predominant, repeat showed no  growth CXR: No acute cardiopulmonary disease, questionable nodular density right upper lung. Repeat PA and lateral chest x-ray as an outpatient, if nodular density persist CT of the chest can be obtained to further evaluate WOC consulted, appreciate recs S/P IV Vanc, Zosyn, switch to p.o. Augmentin to complete a 7-day course of antibiotics Home health RN ordered for chronic wound management  Hypotension Resolved Likely 2/2 above Vs intravascular vol depletion Advised to stay hydrated  Symptomatic anemia/iron deficiency/chronic disease Hgb dropped to 6.5 (also dilutional as pt received significant IVF) Transfused 1U of PRBC on 10/19/18 Continue iron supplementation  Rectal squamous cell Ca/Nodular density in lung on CXR Follow up with [facility] oncology Continue chronic pain meds with adequate bowel regimen  Moderate malnutrition Dietician consulted        Malnutrition Type:  Nutrition Problem: Moderate Malnutrition Etiology: chronic illness(non-hodgkin's lymphoma, rectal cancer)   Malnutrition Characteristics:  Signs/Symptoms: percent weight loss, mild fat depletion, moderate fat depletion, mild muscle depletion, moderate muscle depletion Percent weight loss: 10.7 %   Nutrition Interventions:  Interventions: Ensure Enlive (each supplement provides 350kcal and 20 grams of protein), MVI   Estimated body mass index is 21.23 kg/m as calculated from the following:   Height as of this encounter: 6\' 1"  (1.854 m).   Weight as of this encounter: 73 kg.    Procedures:  None  Consultations:  None  Discharge Exam: BP 102/61    Pulse 84    Temp 97.6 F (36.4 C) (Oral)    Resp 19    Ht 6\' 1"  (1.854 m)    Wt 73  kg    SpO2 100%    BMI 21.23 kg/m   General: NAD Cardiovascular: S1, S2 present Respiratory: CTA B  Discharge Instructions You were cared for by a hospitalist during your hospital stay. If you have any questions about your discharge medications or the care you  received while you were in the hospital after you are discharged, you can call the unit and asked to speak with the hospitalist on call if the hospitalist that took care of you is not available. Once you are discharged, your primary care physician will handle any further medical issues. Please note that NO REFILLS for any discharge medications will be authorized once you are discharged, as it is imperative that you return to your primary care physician (or establish a relationship with a primary care physician if you do not have one) for your aftercare needs so that they can reassess your need for medications and monitor your lab values.   Allergies as of 10/21/2018   No Known Allergies     Medication List    TAKE these medications   amoxicillin-clavulanate 875-125 MG tablet Commonly known as: Augmentin Take 1 tablet by mouth 2 (two) times daily for 4 days.   feeding supplement (ENSURE ENLIVE) Liqd Take 237 mLs by mouth 2 (two) times daily between meals.   ferrous sulfate 325 (65 FE) MG tablet Take 1 tablet (325 mg total) by mouth daily with breakfast. Start taking on: October 22, 2018   MELATONIN PO Take by mouth.   multivitamin with minerals Tabs tablet Take 1 tablet by mouth daily. Start taking on: October 22, 2018   oxyCODONE 5 MG immediate release tablet Commonly known as: Oxy IR/ROXICODONE Take 1-2 tablets (5-10 mg total) by mouth every 4 (four) hours as needed for severe pain. What changed: how much to take   senna-docusate 8.6-50 MG tablet Commonly known as: Senokot-S Take 1 tablet by mouth at bedtime as needed for mild constipation.      No Known Allergies Follow-up Information    [name], MD. Schedule an appointment as soon as possible for a visit in 1 week(s).   Specialties: Family Medicine, Sports Medicine Contact information: [address] [place] [id number] [phone]            The results of significant diagnostics from this  hospitalization (including imaging, microbiology, ancillary and laboratory) are listed below for reference.    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 He presents now with intermittent rectal bleeding over the past 6 months and a 20 pound weight loss over the past 6 months. He also complains of abdominal distention. EXAM: CT ABDOMEN AND PELVIS WITH CONTRAST TECHNIQUE: Multidetector CT imaging of the abdomen and pelvis was performed using the standard protocol following bolus administration of intravenous contrast. CONTRAST:  166mL OMNIPAQUE IOHEXOL 300 MG/ML IV. COMPARISON:  PET-CT 04/27/2010. CT pelvis 03/09/2010. FINDINGS: Lower chest: Respiratory motion blurred images of the lung bases. Visualized lung bases clear. Heart size normal. Hepatobiliary: Liver upper normal in size to perhaps mildly enlarged without focal parenchymal abnormality. Gallbladder contracted without evidence of calcified gallstones. No biliary ductal dilation. Pancreas: Normal in appearance without evidence of mass, ductal dilation, or  inflammation. Spleen: Normal in size and appearance. Adrenals/Urinary Tract: Normal appearing adrenal glands. Kidneys normal in size and appearance without focal parenchymal abnormality. No hydronephrosis. No evidence of urinary tract calculi. Mild diffuse bladder wall thickening. Stomach/Bowel: Stomach normal in appearance for the degree of distention. Normal-appearing small bowel. Very large stool burden involving the transverse colon, descending colon, sigmoid colon and rectum. Liquid stool in a relatively decompressed ascending colon. Appendix  not visible but no pericecal inflammation. Vascular/Lymphatic: Moderate to severe aorto-iliofemoral atherosclerosis without evidence of aneurysm. Normal-appearing portal venous and systemic venous systems. Numerous upper normal sized retroperitoneal lymph nodes in the paracaval, LEFT periaortic, LEFT common iliac and RIGHT common iliac stations, not significantly changed since the prior PET-CT. Mildly enlarged gastrohepatic ligament lymph node. No nodal masses Reproductive: Prostate gland and seminal vesicles normal in size and appearance for age. Other: Open wound involving the perineum, with what I believe is a necrotic mass involving the perineum and perianal region. Small amount of stool in the gluteal fold, indicating mild incontinence. Post radiation changes are present in the presacral space and in the LOWER pelvis. It is difficult to measure the mass due to the necrosis and the associated post radiation changes. Musculoskeletal: Subchondral cysts in the UPPER femoral heads bilaterally without evidence of collapse. Lucent lesion involving the L3 vertebral body. No focal osseous lesions elsewhere. IMPRESSION: 1. Open wound involving the perineum and perianal region, with what I believe to be a necrotic mass involving the perineum and perianal region. It is difficult to measure the mass due to the necrosis and the associated post radiation changes in the pelvis. 2. Borderline to mild hepatomegaly.  No no hepatic masses. 3. Indeterminate lucent lesion involving the L3 vertebral body. No focal osseous lesions elsewhere. 4. Mild diffuse bladder wall thickening, query cystitis. 5. Large colonic stool burden. 6. Small amount of stool in the gluteal fold, indicating mild incontinence. Aortic Atherosclerosis (ICD10-I70.0). Electronically Signed   By: Evangeline Dakin M.D.   On: 10/18/2018 19:31   Dg Chest Port 1 View  Result Date: 10/18/2018 CLINICAL DATA:  Weakness. EXAM: PORTABLE CHEST 1 VIEW COMPARISON:   04/29/2010.  04/07/2010. FINDINGS: Mediastinum hilar structures normal. Questionable pulmonary nodule right upper lung. Repeat PA and lateral chest x-ray suggested. If nodular density persists CT of the chest can be obtained to further evaluate. No pleural effusion or pneumothorax. Heart size normal. Degenerative change thoracic spine. IMPRESSION: 1. Questionable nodular density right upper lung. Repeat PA and lateral chest x-ray suggested. If nodular density persist CT of the chest can be obtained to further evaluate. 2.  No acute cardiopulmonary disease. Electronically Signed   By: Marcello Moores  Register   On: 10/18/2018 15:09   Dg Abd Portable 1 View  Result Date: 10/18/2018 CLINICAL DATA:  Hypotension EXAM: PORTABLE ABDOMEN - 1 VIEW COMPARISON:  None. FINDINGS: The bowel gas pattern is normal. No radio-opaque calculi or other significant radiographic abnormality are seen. IMPRESSION: Negative. Electronically Signed   By: Constance Holster M.D.   On: 10/18/2018 15:13    Microbiology: Recent Results (from the past 240 hour(s))  Urine culture     Status: None   Collection Time: 10/18/18  2:25 PM   Specimen: Urine, Random  Result Value Ref Range Status   Specimen Description URINE, RANDOM  Final   Special Requests   Final    NONE Performed at [facility], [address], [place], [place] [address]    Culture   Final    Multiple  bacterial morphotypes present, none predominant. Suggest appropriate recollection if clinically indicated.   Report Status 10/19/2018 FINAL  Final  Blood culture (routine x 2)     Status: None (Preliminary result)   Collection Time: 10/18/18  2:35 PM   Specimen: BLOOD  Result Value Ref Range Status   Specimen Description BLOOD BLOOD LEFT FOREARM  Final   Special Requests   Final    BOTTLES DRAWN AEROBIC AND ANAEROBIC Blood Culture adequate volume   Culture   Final    NO GROWTH 3 DAYS Performed at [facility], [address], [place], [place] [address]      Report Status PENDING  Incomplete  Blood culture (routine x 2)     Status: None (Preliminary result)   Collection Time: 10/18/18  2:37 PM   Specimen: BLOOD  Result Value Ref Range Status   Specimen Description BLOOD RIGHT ANTECUBITAL  Final   Special Requests   Final    BOTTLES DRAWN AEROBIC AND ANAEROBIC Blood Culture results may not be optimal due to an excessive volume of blood received in culture bottles   Culture   Final    NO GROWTH 3 DAYS Performed at [facility], [place] [address] [place], [place] [address]    Report Status PENDING  Incomplete  SARS CORONAVIRUS 2 (TAT 6-24 HRS) Nasopharyngeal Nasopharyngeal Swab     Status: None   Collection Time: 10/18/18  9:24 PM   Specimen: Nasopharyngeal Swab  Result Value Ref Range Status   SARS Coronavirus 2 NEGATIVE NEGATIVE Final    Comment: (NOTE) SARS-CoV-2 target nucleic acids are NOT DETECTED. The SARS-CoV-2 RNA is generally detectable in upper and lower respiratory specimens during the acute phase of infection. Negative results do not preclude SARS-CoV-2 infection, do not rule out co-infections with other pathogens, and should not be used as the sole basis for treatment or other patient management decisions. Negative results must be combined with clinical observations, patient history, and epidemiological information. The expected result is Negative. Fact Sheet for Patients: SugarRoll.be Fact Sheet for Healthcare Providers: https://www.woods-mathews.com/ This test is not yet approved or cleared by the [place] FDA and  has been authorized for detection and/or diagnosis of SARS-CoV-2 by FDA under an Emergency Use Authorization (EUA). This EUA will remain  in effect (meaning this test can be used) for the duration of the COVID-19 declaration under Section 56 4(b)(1) of the Act, 21 U.S.C. section 360bbb-3(b)(1), unless the authorization is terminated or revoked  sooner. Performed at [facility], [place] [address], [place], [place] [address]   MRSA PCR Screening     Status: None   Collection Time: 10/19/18  7:36 AM   Specimen: Nasal Mucosa; Nasopharyngeal  Result Value Ref Range Status   MRSA by PCR NEGATIVE NEGATIVE Final    Comment:        The GeneXpert MRSA Assay (FDA approved for NASAL specimens only), is one component of a comprehensive MRSA colonization surveillance program. It is not intended to diagnose MRSA infection nor to guide or monitor treatment for MRSA infections. Performed at [facility], [place] [address] [place], [place] [address]   Culture, Urine     Status: None   Collection Time: 10/19/18  4:46 PM   Specimen: Urine, Clean Catch  Result Value Ref Range Status   Specimen Description URINE, CLEAN CATCH  Final   Special Requests NONE  Final   Culture   Final    NO GROWTH Performed  at [facility], [place] [address], [place], [place] [address]    Report Status 10/20/2018 FINAL  Final     Labs: Basic Metabolic Panel: Recent Labs  Lab 10/17/18 1515 10/18/18 1415 10/19/18 0224 10/20/18 0219 10/21/18 0212  NA 132* 133* 133* 134* 136  K 4.8 4.4 3.7 3.4* 3.7  CL 95* 98 104 104 105  CO2 23 23 21* 22 22  GLUCOSE 92 106* 107* 102* 79  BUN 11 10 8  <5* <5*  CREATININE 0.77 0.99 0.82 0.79 0.83  CALCIUM 9.6 9.6 8.5* 8.4* 8.4*   Liver Function Tests: Recent Labs  Lab 10/18/18 1415 10/19/18 0224  AST 14* 11*  ALT 24 19  ALKPHOS 103 84  BILITOT 0.1* 0.1*  PROT 8.2* 6.8  ALBUMIN 2.2* 1.7*   No results for input(s): LIPASE, AMYLASE in the last 168 hours. No results for input(s): AMMONIA in the last 168 hours. CBC: Recent Labs  Lab 10/17/18 1446 10/18/18 1415 10/19/18 0224 10/19/18 1317 10/20/18 0219 10/21/18 0212  WBC 13.1* 12.0* 9.7  --  8.0 8.2  NEUTROABS  --   --   --   --  5.7 5.8  HGB 7.4* 7.7* 6.5* 7.6* 8.0* 7.5*  HCT 23.3* 26.3* 21.8* 24.4* 26.1* 24.6*  MCV 62* 67.1* 66.9*  --   67.4* 67.2*  PLT 621* 625* 533*  --  553* 579*   Cardiac Enzymes: No results for input(s): CKTOTAL, CKMB, CKMBINDEX, TROPONINI in the last 168 hours. BNP: BNP (last 3 results) No results for input(s): BNP in the last 8760 hours.  ProBNP (last 3 results) No results for input(s): PROBNP in the last 8760 hours.  CBG: No results for input(s): GLUCAP in the last 168 hours.     Signed:  Alma Friendly, MD [facility] 10/21/2018, 10:30 AM

## 2018-10-23 LAB — CULTURE, BLOOD (ROUTINE X 2)
Culture: NO GROWTH
Culture: NO GROWTH
Special Requests: ADEQUATE

## 2018-10-26 ENCOUNTER — Telehealth: Payer: Self-pay | Admitting: Family Medicine

## 2018-10-26 NOTE — Telephone Encounter (Signed)
Dylan [name] [phone] [facility]  Need orders for nursing to see him 1x a week for 9 weeks

## 2018-10-29 NOTE — Telephone Encounter (Signed)
MISTAKE

## 2018-10-30 NOTE — Telephone Encounter (Signed)
Dylan [name] calling and requesting an update. Please advise.

## 2018-10-31 NOTE — Telephone Encounter (Signed)
Please advise 

## 2018-11-01 NOTE — Telephone Encounter (Signed)
Verbal order provided for nurse visit 1x/week for 9 weeks to assess wound care. Will sign planned fax that Donita will be sending. Spoke to her this am.

## 2018-11-02 ENCOUNTER — Telehealth: Payer: Self-pay | Admitting: Family Medicine

## 2018-11-02 NOTE — Telephone Encounter (Signed)
Levada Dy, RN is calling [facility] calling to report 92/80.  Patient had no symptoms.  Levada Dy did not know that she had to call the PCP. Levada Dy is no longer with the Patient. CB- [phone]

## 2018-11-06 NOTE — Telephone Encounter (Signed)
Pt has appointment with provider tomorrow, will discuss at appointment.

## 2018-11-07 ENCOUNTER — Ambulatory Visit (INDEPENDENT_AMBULATORY_CARE_PROVIDER_SITE_OTHER): Payer: Medicare Other

## 2018-11-07 ENCOUNTER — Other Ambulatory Visit: Payer: Self-pay

## 2018-11-07 ENCOUNTER — Encounter: Payer: Self-pay | Admitting: Family Medicine

## 2018-11-07 ENCOUNTER — Ambulatory Visit (INDEPENDENT_AMBULATORY_CARE_PROVIDER_SITE_OTHER): Payer: Medicare Other | Admitting: Family Medicine

## 2018-11-07 VITALS — BP 97/57 | HR 112 | Temp 98.6°F | Wt 146.8 lb

## 2018-11-07 DIAGNOSIS — D649 Anemia, unspecified: Secondary | ICD-10-CM

## 2018-11-07 DIAGNOSIS — R42 Dizziness and giddiness: Secondary | ICD-10-CM

## 2018-11-07 DIAGNOSIS — R000 Tachycardia, unspecified: Secondary | ICD-10-CM

## 2018-11-07 DIAGNOSIS — D72829 Elevated white blood cell count, unspecified: Secondary | ICD-10-CM

## 2018-11-07 DIAGNOSIS — I959 Hypotension, unspecified: Secondary | ICD-10-CM | POA: Diagnosis not present

## 2018-11-07 DIAGNOSIS — C210 Malignant neoplasm of anus, unspecified: Secondary | ICD-10-CM

## 2018-11-07 DIAGNOSIS — R9389 Abnormal findings on diagnostic imaging of other specified body structures: Secondary | ICD-10-CM

## 2018-11-07 LAB — POCT CBC
Granulocyte percent: 9.6 %G — AB (ref 37–80)
HCT, POC: 25.4 % — AB (ref 29–41)
Hemoglobin: 7.7 g/dL — AB (ref 11–14.6)
Lymph, poc: 17.1 — AB (ref 0.6–3.4)
MCH, POC: 19.7 pg — AB (ref 27–31.2)
MCHC: 30.2 g/dL — AB (ref 31.8–35.4)
MCV: 65.3 fL — AB (ref 76–111)
MID (cbc): 79.8 — AB (ref 0–0.9)
MPV: 6 fL (ref 0–99.8)
POC Granulocyte: 0.4 — AB (ref 2–6.9)
POC LYMPH PERCENT: 3.1 %L — AB (ref 10–50)
POC MID %: 2.1 %M (ref 0–12)
Platelet Count, POC: 588 10*3/uL — AB (ref 142–424)
RBC: 3.89 M/uL — AB (ref 4.69–6.13)
RDW, POC: 19 %
WBC: 12 10*3/uL — AB (ref 4.6–10.2)

## 2018-11-07 NOTE — Progress Notes (Signed)
Subjective:    Patient ID: Dylan [name], male    DOB: [birth year], 48 y.o.   MRN: [id number]  HPI Dylan [name] is a 48 y.o. male Presents today for: Chief Complaint  Patient presents with  . Follow-up    3 week f/u for wt loss/appetite, and anemia   Initial visit with me to establish care on September 2.  Followed by [facility] hematology/oncology, Dr. [name] in [place], but establish with me for primary care.  Hypotensive at last visit 91/57, tachycardic  History of invasive squamous cell cancer, non-Hodgkin's lymphoma, chronic anemia, but found to have worsening anemia with hemoglobin at 7.4 and leukocytosis of 13.1.  Sent to ER for evaluation.  He was admitted from September 3 through September 6.  With hypotension, sepsis, necrotic mass involving perineum/perianal region noted on CT abdomen pelvis.  Thought to be possibly septic secondary to the perineal wound.  Treated with vancomycin, Zosyn then changed to p.o. Augmentin to complete 7-day course of antibiotics.  Home health nurse for chronic wound management.  Verbal order was given for once per week nursing assessment. WBC decreased to 8.0 in hospital, and discharge WBC 8.2.   Completed augmentin.  Overall had been fFeeling better since hospital, but still some bleeding from wound - more bleeding past few days and lightheaded. No new pain, no fever.   Hypotension with anemia Treated with hydration, transfuse 1 unit of packed red blood cells on September 4 due to hemoglobin 6.5, thought to be somewhat dilutional with IV fluids.  Recommended continue iron supplementation Note from [facility] home health from September 18, blood pressure 92/80 but no symptoms.  Lightheaded at times - when moves too fast. Past few days only having lightheadedness. Not feeling lightheaded last week.  No syncope.  Iron 325 mg QD. No side effects.  Home BP [id number] systolic since having rectal bleeding.  HGB peak at 8.0 after transfusion. Nadir of 6.5 at  last hospitalization, but thought to be possible dilutional form 7.7 on day prior.   Lab Results  Component Value Date   WBC 8.2 10/21/2018   HGB 7.5 (L) 10/21/2018   HCT 24.6 (L) 10/21/2018   MCV 67.2 (L) 10/21/2018   PLT 579 (H) 10/21/2018   BP Readings from Last 3 Encounters:  11/07/18 (!) 97/57  10/21/18 102/61  10/17/18 (!) 91/57   Rectal squamous cell cancer: Recommend continued follow-up with [facility] oncology and chronic pain meds with bowel regimen discussed.  Dietitian was also consulted with moderate malnutrition found in the hospital.  There was a nodular density noted in the right upper lung, plan for repeat PA and lateral chest x-ray as an outpatient with possible CT chest if needed.  Telemedicine appointment yesterday with [facility] hematology/oncology, [name], FNP.  No notes visible for review at this time. Per pt- plans to get in touch with colorectal surgeon at [facility] to evaluate for procedure to help with bleeding. Waiting on call back. Unsure of follow up plan.  Still on oxycodone for pain mgt - doing well and bowel soft with senokot S.  Ensure 1-2 per day in addition to other regular meals - 2 per day.  Drinking fluids - not measuring amounts. UOP - voiding about 2-3 times per day.  2 times today.    Wt Readings from Last 3 Encounters:  11/07/18 146 lb 12.8 oz (66.6 kg)  10/20/18 160 lb 15 oz (73 kg)  10/17/18 146 lb 12.8 oz (66.6 kg)   Patient Active  Problem List   Diagnosis Date Noted  . Malnutrition of moderate degree 10/20/2018  . Hypotension 10/18/2018  . Tachycardia 10/18/2018  . Anemia 10/18/2018  . Iron deficiency 10/18/2018  . Sepsis ([place]) 10/18/2018  . Anal cancer ([place]) 02/10/2014   Past Medical History:  Diagnosis Date  . Cancer ([place]) 2018   anal cancer  . S/P radiation therapy 07/07/14-7/20/216   anal ca    No past surgical history on file. No Known Allergies Prior to Admission medications   Medication Sig Start Date End Date Taking?  Authorizing Provider  feeding supplement, ENSURE ENLIVE, (ENSURE ENLIVE) LIQD Take 237 mLs by mouth 2 (two) times daily between meals. 10/21/18  Yes Alma Friendly, MD  ferrous sulfate 325 (65 FE) MG tablet Take 1 tablet (325 mg total) by mouth daily with breakfast. 10/22/18 11/21/18 Yes Alma Friendly, MD  MELATONIN PO Take by mouth.   Yes [provider]  Multiple Vitamin (MULTIVITAMIN WITH MINERALS) TABS tablet Take 1 tablet by mouth daily. 10/22/18 11/21/18 Yes Alma Friendly, MD  oxyCODONE (OXY IR/ROXICODONE) 5 MG immediate release tablet Take 1-2 tablets (5-10 mg total) by mouth every 4 (four) hours as needed for severe pain. Patient taking differently: Take 20 mg by mouth every 4 (four) hours as needed for severe pain.  03/02/15  Yes Hayden Pedro, PA-C  senna-docusate (SENOKOT-S) 8.6-50 MG tablet Take 1 tablet by mouth at bedtime as needed for mild constipation. 10/21/18 11/20/18 Yes Alma Friendly, MD   Social History   Socioeconomic History  . Marital status: Single    Spouse name: Not on file  . Number of children: Not on file  . Years of education: Not on file  . Highest education level: Not on file  Occupational History  . Not on file  Social Needs  . Financial resource strain: Not on file  . Food insecurity    Worry: Not on file    Inability: Not on file  . Transportation needs    Medical: Not on file    Non-medical: Not on file  Tobacco Use  . Smoking status: Current Some Day Smoker    Packs/day: 0.30    Types: Cigarettes  . Smokeless tobacco: Current User  Substance and Sexual Activity  . Alcohol use: No  . Drug use: Not on file  . Sexual activity: Not on file  Lifestyle  . Physical activity    Days per week: Not on file    Minutes per session: Not on file  . Stress: Not on file  Relationships  . Social Herbalist on phone: Not on file    Gets together: Not on file    Attends religious service: Not on file    Active  member of club or organization: Not on file    Attends meetings of clubs or organizations: Not on file    Relationship status: Not on file  . Intimate partner violence    Fear of current or ex partner: Not on file    Emotionally abused: Not on file    Physically abused: Not on file    Forced sexual activity: Not on file  Other Topics Concern  . Not on file  Social History Narrative  . Not on file    Review of Systems Per HPI.     Objective:   Physical Exam Vitals signs reviewed.  Constitutional:      General: He is not in acute distress.  Appearance: He is well-developed and underweight. He is not ill-appearing, toxic-appearing or diaphoretic.  HENT:     Head: Normocephalic and atraumatic.  Eyes:     Pupils: Pupils are equal, round, and reactive to light.  Neck:     Vascular: No carotid bruit or JVD.  Cardiovascular:     Rate and Rhythm: Regular rhythm. Tachycardia present.     Heart sounds: No murmur.  Pulmonary:     Effort: Pulmonary effort is normal.     Breath sounds: Normal breath sounds. No rales.  Abdominal:     General: Abdomen is flat. There is no distension.     Tenderness: There is no abdominal tenderness. There is no guarding.  Musculoskeletal:     Right lower leg: No edema.     Left lower leg: No edema.  Skin:    General: Skin is warm and dry.  Neurological:     Mental Status: He is alert and oriented to person, place, and time.    Vitals:   11/07/18 1350  BP: (!) 97/57  Pulse: (!) 112  Temp: 98.6 F (37 C)  TempSrc: Oral  SpO2: 100%  Weight: 146 lb 12.8 oz (66.6 kg)   Results for orders placed or performed in visit on 11/07/18  POCT CBC  Result Value Ref Range   WBC 12.0 (A) 4.6 - 10.2 K/uL   Lymph, poc 17.1 (A) 0.6 - 3.4   POC LYMPH PERCENT 3.1 (A) 10 - 50 %L   MID (cbc) 79.8 (A) 0 - 0.9   POC MID % 2.1 0 - 12 %M   POC Granulocyte 0.4 (A) 2 - 6.9   Granulocyte percent 9.6 (A) 37 - 80 %G   RBC 3.89 (A) 4.69 - 6.13 M/uL   Hemoglobin  7.7 (A) 11 - 14.6 g/dL   HCT, POC 25.4 (A) 29 - 41 %   MCV 65.3 (A) 76 - 111 fL   MCH, POC 19.7 (A) 27 - 31.2 pg   MCHC 30.2 (A) 31.8 - 35.4 g/dL   RDW, POC 19.0 %   Platelet Count, POC 588 (A) 142 - 424 K/uL   MPV 6.0 0 - 99.8 fL    Orthostatic VS for the past 24 hrs (Last 3 readings):  BP- Lying Pulse- Lying BP- Sitting Pulse- Sitting BP- Standing at 3 minutes Pulse- Standing at 3 minutes  11/07/18 1439 (!) 84/46 100 92/64 112 98/62 139    Dg Chest 2 View  Result Date: 11/07/2018 CLINICAL DATA:  Possible right pulmonary nodule. EXAM: CHEST - 2 VIEW COMPARISON:  Single-view of the chest 10/18/2018 and PA and lateral chest 04/07/2010. FINDINGS: 2 punctate nodular densities are seen in the right lung. One of these projects over the right seventh rib and the second projects between the right seventh and eighth ribs. Left lung is clear. No pneumothorax or pleural effusion. Heart size is normal. No acute or focal bony abnormality. IMPRESSION: 2 punctate nodular densities in the right lung cannot be definitively characterized. CT chest with contrast is recommended. Electronically Signed   By: Inge Rise M.D.   On: 11/07/2018 15:09        Assessment & Plan:  Dylan [name] is a 48 y.o. male Tachycardia - Plan: POCT CBC, Orthostatic vital signs  Hypotension, unspecified hypotension type - Plan: POCT CBC, Orthostatic vital signs  Anemia, unspecified type - Plan: POCT CBC, Orthostatic vital signs  Lightheadedness - Plan: POCT CBC, Orthostatic vital signs  Abnormal radiologic density -  Plan: DG Chest 2 View  Squamous cell cancer, anus (HCC)  Leukocytosis, unspecified type  Unfortunately had recurrence of symptomatic anemia with hypotension, lightheadedness/orthostasis.  Reports increased bleeding recently from wound.  Reportedly has plan to discuss with surgeon at [facility], but has not heard details yet.  Suspect some component of volume depletion and possible hemoconcentrated reading.   Additionally with increased leukocytosis, again possible hemoconcentration.   -Further evaluation through emergency room, possible IV fluids and can decide on admission, transfusion.  Plan of transport by private vehicle with someone driving him.  Follow-up with me after ER or hospitalization.  Abnormal chest x-ray noted.  Likely will need CT scan, can potentially be done outpatient unless he is admitted. No orders of the defined types were placed in this encounter.  Patient Instructions        If you have lab work done today you will be contacted with your lab results within the next 2 weeks.  If you have not heard from [place] then please contact us. The fastest way to get your results is to register for My Chart.   IF you received an x-ray today, you will receive an invoice from [facility]. Please contact [facility] at [phone] with questions or concerns regarding your invoice.   IF you received labwork today, you will receive an invoice from [place]. Please contact [facility] at [phone] with questions or concerns regarding your invoice.   Our billing staff will not be able to assist you with questions regarding bills from these companies.  You will be contacted with the lab results as soon as they are available. The fastest way to get your results is to activate your My Chart account. Instructions are located on the last page of this paperwork. If you have not heard from [place] regarding the results in 2 weeks, please contact this office.     Unfortunately blood count/anemia is still low and may be lower than readings here as that can happen with dehydration/volume depletion.  I am concerned about that reading along with the infection fighting cells that are elevated, and your lightheadedness/dizziness.    Please proceed to the emergency room for further evaluation and testing as you may need IV fluids or possible blood transfusion as required earlier this month, and  possible admission again.  Ultimately may need to discuss plan with the surgeon at [facility] if bleeding has increased.  Signed,   Merri Ray, MD [facility] at [place].  11/07/18 10:10 PM

## 2018-11-07 NOTE — Patient Instructions (Addendum)
      If you have lab work done today you will be contacted with your lab results within the next 2 weeks.  If you have not heard from [place] then please contact us. The fastest way to get your results is to register for My Chart.   IF you received an x-ray today, you will receive an invoice from [facility]. Please contact [facility]iology at [phone] with questions or concerns regarding your invoice.   IF you received labwork today, you will receive an invoice from [place]. Please contact [facility] at [phone] with questions or concerns regarding your invoice.   Our billing staff will not be able to assist you with questions regarding bills from these companies.  You will be contacted with the lab results as soon as they are available. The fastest way to get your results is to activate your My Chart account. Instructions are located on the last page of this paperwork. If you have not heard from [place] regarding the results in 2 weeks, please contact this office.     Unfortunately blood count/anemia is still low and may be lower than readings here as that can happen with dehydration/volume depletion.  I am concerned about that reading along with the infection fighting cells that are elevated, and your lightheadedness/dizziness.    Please proceed to the emergency room for further evaluation and testing as you may need IV fluids or possible blood transfusion as required earlier this month, and possible admission again.  Ultimately may need to discuss plan with the surgeon at [facility] if bleeding has increased.

## 2018-11-09 ENCOUNTER — Telehealth: Payer: Self-pay | Admitting: Family Medicine

## 2018-11-09 NOTE — Telephone Encounter (Signed)
I spoke with Dylan [name] he states he has an upcoming appt in [place] on 11/12/18 to see a Psychologist, sport and exercise.  He stated he did not go to the ER he called and scheduled the appt himself

## 2018-11-09 NOTE — Telephone Encounter (Signed)
Plan for emergency room eval after his visit 2 days ago.  I do not see where he was seen.  I called with the phone number, to check on status but no answer and unable to leave voicemail.  Please call patient and check status, was he seen at other facility?

## 2018-11-14 ENCOUNTER — Telehealth: Payer: Self-pay | Admitting: Internal Medicine

## 2018-11-14 NOTE — Telephone Encounter (Signed)
Called patient to see if he had established with a PCP in his local area and if so that we could contact them to set up Palliative services, no answer - unable to leave message, no voicemail set up.  I called Sonia Baller, Clinical Care Coordinator at [facility], with no answer - left a message to let her know that we have been unable to speak with the patient since July to see if he has established a PCP in his local area, so we could try and set up Palliative services here and that we are going to cancel this referral.  Left my name and contact number if she had any questions.

## 2018-11-28 ENCOUNTER — Telehealth: Payer: Self-pay

## 2018-11-28 NOTE — Telephone Encounter (Signed)
Dr. Carlota Raspberry contacted to verify that he is new PCP and if palliative should continue to follow. Per Dr. Carlota Raspberry, palliative is ok to continue if patient is in agreement.

## 2018-12-04 ENCOUNTER — Telehealth: Payer: Self-pay

## 2018-12-04 NOTE — Telephone Encounter (Addendum)
Called and spoke with Clarene Critchley RN, Clinical Coordinator, [place] [phone]. She faxed discharge summary from inpatient admission 104-11/21/2018 alomg with Consult note dated 11/20/18 from Dr. Dario Guardian, Radiation Oncology. Unable to view these documents in Care Everywhere. I will have documents scanned into Epic. Patient be referred to medical oncology for "palliative chemo therapy for rectal bleeding". Referral message sent to NP scheduler on 10/15, awaiting an appointment T/D.

## 2018-12-05 ENCOUNTER — Telehealth: Payer: Self-pay

## 2018-12-05 NOTE — Telephone Encounter (Signed)
New patient scheduler and I have made  multiple attempts to reach patient to advise of initial consult with Dr. Lorenso Courier on 10/27. Patient has a VM that has not been set up. Will continue to try.

## 2018-12-05 NOTE — Progress Notes (Signed)
  Oncology Nurse Navigator Documentation   Received VM from [facility] RN from [place]. Patient seen in [facility] today and requested that he be referred to [facility] for palliative care. Patient started on Methadone for pain control and will need EKG and follow-up care for Methadone. Returned call to [place]. She will make referral to PCP for management of Methadone and will place referral to [place] for palliative management.

## 2018-12-05 NOTE — Telephone Encounter (Signed)
Spoke with patient to confirm apt with Dr. Lorenso Courier on 10/27 @ 1 PM. Patient is aware to arrive 20 minutes early and understands the visitor restrictions. Provided my direct number for questions.

## 2018-12-11 ENCOUNTER — Telehealth: Payer: Self-pay | Admitting: *Deleted

## 2018-12-11 ENCOUNTER — Inpatient Hospital Stay: Payer: Medicare Other | Admitting: Hematology and Oncology

## 2018-12-11 ENCOUNTER — Telehealth: Payer: Self-pay

## 2018-12-11 NOTE — Telephone Encounter (Signed)
Attempted call to patient as he did not show up for his 1pm appt today with Dr. Lorenso Courier.  No answer to call and phone # did not has vm set up, therefore unable to leave  Message for pt to call back to reschedule

## 2018-12-11 NOTE — Telephone Encounter (Signed)
Spoke with patient to check in and to offer to schedule a visit with Palliative NP. Patient had missed his appointment today at [facility]. Visit with Palliative care scheduled for Friday 12/14/2018. Patient requested that NP call prior to coming on Friday. Patient has a visit with [facility] tomorrow and is unsure if they will keep him in the hospital. [facility] updated.

## 2018-12-12 ENCOUNTER — Telehealth: Payer: Self-pay | Admitting: Hematology and Oncology

## 2018-12-12 ENCOUNTER — Encounter: Payer: Self-pay | Admitting: Hematology and Oncology

## 2018-12-12 NOTE — Telephone Encounter (Signed)
Letter mailed

## 2018-12-14 ENCOUNTER — Other Ambulatory Visit: Payer: Self-pay

## 2018-12-14 ENCOUNTER — Other Ambulatory Visit: Payer: Medicare Other | Admitting: Internal Medicine

## 2018-12-14 DIAGNOSIS — Z515 Encounter for palliative care: Secondary | ICD-10-CM

## 2018-12-14 NOTE — Progress Notes (Signed)
Oct 30th, 2020 [facility] Palliative Care Consult Note Telephone: [phone]  Fax: [phone]   Due to the current COVID-19 infection/crises, the family prefer, and have given their verbal consent for, a provider visit via telemedicine. HIPPA policies of confidentially were discussed. Video-audio (telehealth) contact was unable to be done due technical barriers from the patient's side. PATIENT NAME: Dylan [name] DOB: 13-Dec-1970 MRN: [id number] [place] [id number] [phone] [email or web address]  PRIMARY CARE PROVIDER:   [name], MD [address] [place],  [place] [address]  REFERRING PROVIDER:   Fredric Dine, NP [facility] Provider: Dr. Randon Goldsmith, Dr. Marena Chancy  RESPONSIBLE PARTY: (dtr/agent) Chardonay Colston [phone]. (dtr) [place] Whitfield ([phone] . (dtr) Telford Nab [phone]. (dtr)Porsha  ASSESSMENT / RECOMMENDATIONS:  1. Advance Care Planning: A. Directives: Discussed; patient desires  full resuscitative efforts in the event of a cardiopulmonary arrest.  B. Goals of Care:  Want very best treatment available to him, if side effects are tolerable. Has been told that he would need chemo every 3 weeks for the rest of his lime. He wants to live to enjoy his grandchildren. He's been advised he needs to have a colostomy, to decrease the risk of an infection. Also, that he needs a port for chemo. He'd like to have these done in [place] location if possible. Patient's father has recommended that patient seek additional opinion at the [facility] clinic in [place], [place]. I believe he has an appointment there for Nov 6th. He also has an appointment to establish with oncology here in [place] on Nov 4th. We cobbled together a list of questions he would like to address during the time of his oncology consult on the 4th:  -What is it exactly that is causing my bleeding. In other  words, is it the tumor bleeding or is it eroding a blood vessel.  -How frequently will you check my hemoglobin level to see if I would need another transfusion? How low is an acceptable hemoglobin level.  -What side effects from this chem should I expect? -[facility] recommended chemo infusion every 3 weeks. Is that what you would recommend, and would it be the same as what they recommend. -My dad recommended second opinion at [facility] clinic in [place]. How do you feel about that? I have scheduled this for No 6th.  -I want the very best treatment with the very best outcome.  But I'm getting different opinions for different oncologists. How can I feel confident that I'm making the best decision? Can the teams communicate with each other?  -Is it ok to eat a lot of calories or am I feeding my tumor? What keeps him going wants to watch kids grow "I have a lot of life in me". Not a person that would get stressed.  2. Symptom Management: A. Pain: Pain free at the time of my visit. Pain located per-rectal; states good relief with prn Oxy 20 q4-6h (averages 3.5 tabs/24hr). He continues with spots of bleeding form rectal area.  B. Weight loss: Over the last 8 months he has lost 60lbs (30% of body weight). Patient's current weight is 140lb. At a height of 6'1" his BMI 18.5 kg/m2. Patient was under the impression that he had to limit his calorie intake because other wise he was "feeding the tumor". He's eating a healthy but light diet; trying to eat majority of veggies. Denies nausea. Ensure 2 cans/day.  -We discussed  the wasting nature of cancer, but that it was important to buff up total calories/day. That the tumor will not grow any faster if he eats increased calories to maintain a good weight. Okay to eat sweets. Discussed trying Carnation Instant Breakfast ice cream/milk shakes.  3. Cognitive / Functional status: Feeing weak; he's frustrated b/c he feels fine, rested, and motivated when he's lying down, but quickly  exhausted/fatigued when up and about. Patient transfers and ambulates independently, no falls. Episodic dizziness when first stands and if moves too quickly. No probs with urination. No sores.  -I e-mailed patient some low impact exercises he might try to increase his stamina. Encouraged him to do more walking.  4. Family Supports: Staying with his daughter Cordelia Pen and her 4 daughters ages 39-7. Patient has another daughter/AHPOA Chardonay Pertuit who usually is the child who accompanies patient to his doctor's appointments. Patient is not working but is motivated to get back.  5. Follow up Palliative Care Visit: Fri Nov 20th at 2:30pm  I spent 60 minutes providing this consultation from 4 to 5pm. More than 50% of the time in this consultation was spent coordinating communication.   HISTORY OF PRESENT ILLNESS:  Dylan [name] is a 48 y.o. male with history of Hodgkin's lymphoma, perianal condyloma, and squamous cell carcinoma of the anus who has been lost to follow up for ~1.5 years, admitted to [facility] on 11/18/2018 with several months of bleeding from his perineum and marked progression of disease. Afebrile, tachycardic on admission, anemic to 7.3. Hospitalized 10/4-10/08/2018: Chest CT: worsening lung nodules cw metastatic disease. MRI pelvis: locally invasive dx. Not a surgical cal. Rad Onc recommended against further radiation with possible tumor embolization with VIR in future. Med oncology recommended PET scan OP and chemo. Tx 2 u's PRBCs of Hbg 7.3  Palliative Care was asked to help address goals of care.   CODE STATUS: Full Code  PPS: 70% HOSPICE ELIGIBILITY/DIAGNOSIS: TBD  PAST MEDICAL HISTORY:  Past Medical History:  Diagnosis Date  . Cancer ([place]) 2018   anal cancer  . S/P radiation therapy 07/07/14-7/20/216   anal ca     SOCIAL HX:  Social History   Tobacco Use  . Smoking status: Current Some Day Smoker    Packs/day: 0.30    Types: Cigarettes  . Smokeless tobacco: Current User   Substance Use Topics  . Alcohol use: No    ALLERGIES: No Known Allergies   PERTINENT MEDICATIONS:  Outpatient Encounter Medications as of 12/14/2018  Medication Sig  . feeding supplement, ENSURE ENLIVE, (ENSURE ENLIVE) LIQD Take 237 mLs by mouth 2 (two) times daily between meals.  . ferrous sulfate 325 (65 FE) MG tablet Take 1 tablet (325 mg total) by mouth daily with breakfast.  . MELATONIN PO Take by mouth.  . oxyCODONE (OXY IR/ROXICODONE) 5 MG immediate release tablet Take 1-2 tablets (5-10 mg total) by mouth every 4 (four) hours as needed for severe pain. (Patient taking differently: Take 20 mg by mouth every 4 (four) hours as needed for severe pain. )   No facility-administered encounter medications on file as of 12/14/2018.     PHYSICAL EXAM:   PE deferred d/t tele-health/audio only nature of visit. Patient is alert and pleasantly conversant. No dyspnea with conversation.  [name], NP

## 2018-12-17 ENCOUNTER — Telehealth: Payer: Self-pay | Admitting: *Deleted

## 2018-12-17 NOTE — Telephone Encounter (Signed)
On 12-17-18 faxed medical records to [facility] of radiation oncology

## 2018-12-19 ENCOUNTER — Encounter: Payer: Self-pay | Admitting: Internal Medicine

## 2018-12-19 ENCOUNTER — Inpatient Hospital Stay: Payer: Medicare Other

## 2018-12-19 ENCOUNTER — Other Ambulatory Visit: Payer: Self-pay

## 2018-12-19 ENCOUNTER — Inpatient Hospital Stay: Payer: Medicare Other | Attending: Hematology and Oncology | Admitting: Hematology and Oncology

## 2018-12-19 ENCOUNTER — Telehealth: Payer: Self-pay | Admitting: Family Medicine

## 2018-12-19 VITALS — BP 92/60 | HR 105 | Temp 98.0°F | Resp 18 | Ht 73.0 in | Wt 140.1 lb

## 2018-12-19 DIAGNOSIS — D500 Iron deficiency anemia secondary to blood loss (chronic): Secondary | ICD-10-CM | POA: Insufficient documentation

## 2018-12-19 DIAGNOSIS — Z8572 Personal history of non-Hodgkin lymphomas: Secondary | ICD-10-CM | POA: Insufficient documentation

## 2018-12-19 DIAGNOSIS — F17210 Nicotine dependence, cigarettes, uncomplicated: Secondary | ICD-10-CM | POA: Insufficient documentation

## 2018-12-19 DIAGNOSIS — C210 Malignant neoplasm of anus, unspecified: Secondary | ICD-10-CM

## 2018-12-19 DIAGNOSIS — Z79899 Other long term (current) drug therapy: Secondary | ICD-10-CM | POA: Diagnosis not present

## 2018-12-19 LAB — CMP (CANCER CENTER ONLY)
ALT: 13 U/L (ref 0–44)
AST: 11 U/L — ABNORMAL LOW (ref 15–41)
Albumin: 2.2 g/dL — ABNORMAL LOW (ref 3.5–5.0)
Alkaline Phosphatase: 99 U/L (ref 38–126)
Anion gap: 10 (ref 5–15)
BUN: 10 mg/dL (ref 6–20)
CO2: 26 mmol/L (ref 22–32)
Calcium: 9.3 mg/dL (ref 8.9–10.3)
Chloride: 99 mmol/L (ref 98–111)
Creatinine: 0.89 mg/dL (ref 0.61–1.24)
GFR, Est AFR Am: >60 mL/min (ref >60)
GFR, Estimated: >60 mL/min (ref >60)
Glucose, Bld: 84 mg/dL (ref 70–99)
Potassium: 4.1 mmol/L (ref 3.5–5.1)
Sodium: 135 mmol/L (ref 135–145)
Total Bilirubin: <0.2 mg/dL — ABNORMAL LOW (ref 0.3–1.2)
Total Protein: 8.4 g/dL — ABNORMAL HIGH (ref 6.5–8.1)

## 2018-12-19 LAB — CBC WITH DIFFERENTIAL (CANCER CENTER ONLY)
Abs Immature Granulocytes: 0.04 10*3/uL (ref 0.00–0.07)
Basophils Absolute: 0 10*3/uL (ref 0.0–0.1)
Basophils Relative: 0 %
Eosinophils Absolute: 0.1 10*3/uL (ref 0.0–0.5)
Eosinophils Relative: 0 %
HCT: 23.2 % — ABNORMAL LOW (ref 39.0–52.0)
Hemoglobin: 7 g/dL — ABNORMAL LOW (ref 13.0–17.0)
Immature Granulocytes: 0 %
Lymphocytes Relative: 10 %
Lymphs Abs: 1.2 10*3/uL (ref 0.7–4.0)
MCH: 20.5 pg — ABNORMAL LOW (ref 26.0–34.0)
MCHC: 30.2 g/dL (ref 30.0–36.0)
MCV: 68 fL — ABNORMAL LOW (ref 80.0–100.0)
Monocytes Absolute: 0.8 10*3/uL (ref 0.1–1.0)
Monocytes Relative: 7 %
Neutro Abs: 9.4 10*3/uL — ABNORMAL HIGH (ref 1.7–7.7)
Neutrophils Relative %: 83 %
Platelet Count: 585 10*3/uL — ABNORMAL HIGH (ref 150–400)
RBC: 3.41 MIL/uL — ABNORMAL LOW (ref 4.22–5.81)
RDW: 20.3 % — ABNORMAL HIGH (ref 11.5–15.5)
WBC Count: 11.5 10*3/uL — ABNORMAL HIGH (ref 4.0–10.5)
nRBC: 0 % (ref 0.0–0.2)

## 2018-12-19 LAB — IRON AND TIBC
Iron: <5 ug/dL — ABNORMAL LOW (ref 42–163)
Saturation Ratios: UNDETERMINED % (ref 20–55)
TIBC: 176 ug/dL — ABNORMAL LOW (ref 202–409)
UIBC: UNDETERMINED ug/dL (ref 117–376)

## 2018-12-19 LAB — LACTATE DEHYDROGENASE: LDH: 71 U/L — ABNORMAL LOW (ref 98–192)

## 2018-12-19 LAB — FERRITIN: Ferritin: 92 ng/mL (ref 24–336)

## 2018-12-19 LAB — MAGNESIUM: Magnesium: 1.8 mg/dL (ref 1.7–2.4)

## 2018-12-19 LAB — CEA (IN HOUSE-CHCC): CEA (CHCC-In House): 4.35 ng/mL (ref 0.00–5.00)

## 2018-12-19 NOTE — Progress Notes (Signed)
Pt has Palliative care visits weekly along with weekly home health

## 2018-12-19 NOTE — Telephone Encounter (Signed)
Called [facility] and okayed the verbal orders for PT OT And skilled nursing for wound care

## 2018-12-19 NOTE — Telephone Encounter (Unsigned)
Copied from [place] [phone]. Topic: Quick Communication - Home Health Verbal Orders >> Dec 19, 2018 10:11 AM Yvette Rack wrote: Caller/Agency: Margie with [facility]  Callback Number: [phone] Requesting OT/PT/Skilled Nursing/Social Work/Speech Therapy: skilled nursing Frequency: 1 time a week for 9 weeks for wound care

## 2018-12-20 ENCOUNTER — Telehealth: Payer: Self-pay

## 2018-12-20 NOTE — Telephone Encounter (Signed)
Homehealth called requesting the doctor put in verbal orders for the pt. As they needs for assistance wound care for 9 weeks.   Phone number [phone], Secure voice mail

## 2018-12-21 ENCOUNTER — Telehealth: Payer: Self-pay | Admitting: Hematology and Oncology

## 2018-12-21 ENCOUNTER — Encounter: Payer: Self-pay | Admitting: Hematology and Oncology

## 2018-12-21 NOTE — Progress Notes (Signed)
[facility] Telephone:([phone]   Fax:([phone]) [facility] NOTE  Patient Care Team: [name], MD as PCP - General (Family Medicine) [name], NP as Nurse Practitioner (Hospice and Palliative Medicine)  Hematological/Oncological History  # Metastatic Anal Cancer (summarized history obtained from [facility] Surgical Notes) 1) 03/09/10: CT scan performed showed superficial inflammation along the medial crease of the left buttocks within the subcutaneous fat measuring 2.5 x 5 cm including no areas of deep penetration with a few associated bubbles of air and gas present.  2) 03/10/10: A 2 x 2 cm area of condylomata was present in the perianal area that was excised by Dr. [name], along with multiple I&Ds of left gluteal abscesses. 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 No lymph nodes biopsied. 3) 03/29/10: PET scan showed abnormal 5.7 x 7.1 cm hypermetabolic soft tissue in the perianal region is compatible with patient's history of squamous cell carcinoma. Bilateral inguinal adenopathy, largest on left is hypermetabolic and measures 1.8 x 2.9 cm, and largest on the right measures 1.2 cm without hypermetabolism; question secondary to infection or metastatic involvement 4) 04/2010: Evaluated by Radiation Oncologist Dr. [name] at [facility]. He was not deemed a candidate for radiation given concern for metastatic disease based on PET scan. 5) 06/22/10: Recommended to begin systemic therapy with cisplatin and continuous infusion 5-FU for metastatic disease. 6) 07/2010: Re-evaluated at [facility] by Dr. [name] and Dr. [name], as he did not have biopsy proven metastatic disease, and inguinal adenopathy may be reactive due to infection/inflammation/abscess. 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 9) 02/10/14: After lost to follow-up, he returned to [facility] for evaluation by Dr. [name] due to concern for recurrence of disease throughout the perineum and perianal areas. The most worrisome is a large ulcer on the left perianal area.  10) 02/18/14: Exam under anesthesia with biopsies performed. Left perianal region biopsy confirmed invasive squamous cell carcinoma, moderate-to-poorly differentiated. Patient refused at this time APR. 11) 05/01/14: PET CT: Perianal and left groin/penile malignancy with bilateral inguinal and left external iliac metastases. 12) 07/07/14 - 09/03/14: XRT. Patient declined recommended concurrent 5FU/[facility]. At [facility]. Patient refused APR. 13) 12/19/16: PET CT: No suspicious metabolically active osseous lesions are identified - Interval increase in the extent of the hypermetabolic activity involving the region of the anus which extends up into the fascia adjacent the rectum and out into the bilateral buttocks associated with soft tissue erosion and skin thickening. 14) 12/19/16: MRI Pelvis: Motion artefact. Anal tumor with gross invasion of the anal sphincters and extension into the ischioanal fossa, with irregular appearance of the perineum/gluteal skin, also abutting the pelvic sidewall (left > right) and likely invading prostate apex. Overall, this is increased from prior CT 01/05/2015, concerning for disease progression. Exam revealed obliteration of the anus. Patient refused APR. 15) 03/2018 - consideration for pembrolizumab - patient declined. 16) 10/2018 - represents with substantial bleeding episodes from erosive, locally-advanced anal cancer  17) 11/12/2018: reviewed by [facility] surgery again. Admitted for consideration of ostomy from 10/4 to 11/21/2018. 18) 12/10/18-12/12/18: patient  seen by [facility] (Dr. Dennison Nancy) and Surgical Oncology. PET CT, MRI pelvis, and CT chest performed. 19) 12/19/2018: establish  care with Dr. Lorenso Courier    #High Grade Non-Hodgkins Lymphoma 1)1999: He was treated for high-grade Non-Hodgkin's Lymphoma by Dr. [name] in [place], [place]. He received a year of chemotherapy. No radiation.    CHIEF COMPLAINTS/PURPOSE OF CONSULTATION:  Metastatic Anal Cancer  HISTORY OF PRESENTING ILLNESS:  Dylan [name] 48 y.o. male with medical history significant for non-Hodgkin lymphoma (treated in 1999 with chemo alone) who presents for evaluation of metastatic anal cancer. His extensive cancer history is documented in its entirety above.  In short, Dylan [name] was initially diagnosed with anal cancer in 2012 and predominately underwent localized surgical procedures intitially. When PET scan revealed concern for locally aggressive and metastatic disease systemic therapy was recommended. Radiation therapy was not thought to have a role at that time. Chemotherapy was recommended, but not performed.  He was then lost to follow up between 2012 and late 2015/early 2016. When he re-established care he was though to have progressive disease. He underwent radiation therapy at [facility], but APR and chemotherapy were both declined. Most recently in Feb 2020 he was offered pembrolizumab, but declined therapy. He now presents after having been re-reviewed at [facility] and [facility]   On exam today Dylan [name] has to lay on the table to prevent pain in his wound. He is soft spoken, but answers appropriately. He notes he is having bleeding from his backside and is now passing urine through his anus. He reports no urine passing through his penis for several days. His bleeding requires him to wear depends. He notes no purulent discharge. He denies any systemic symptoms of infection, no fever, chills, sweats, nausea or vomiting. His bowel movements are formed. He notes  his appetite is poor, but he does eat and try to keep up with hydration. His pain is currently under control. He follows with a home health hospice agency who help manage his symptoms.  On further discussion, I did enquire as to why he sought the opinion of so many medical centers ([place], [place]). He notes his father got him the referrals and that "everyone has said something different". I asked who he would like to manage his care and he noted he would think about it. 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  A 10 point ROS is listed below.   MEDICAL HISTORY:  Past Medical History:  Diagnosis Date  . Cancer ([place]) 2018   anal cancer  . S/P radiation therapy 07/07/14-7/20/216   anal ca     SURGICAL HISTORY: Reported extensively above.  SOCIAL HISTORY: Social History   Socioeconomic History  . Marital status: Single    Spouse name: Not on file  . Number of children: Not on file  . Years of education: Not on file  . Highest education level: Not on file  Occupational History  . Not on file  Social Needs  . Financial resource strain: Not on file  . Food insecurity    Worry: Not on file    Inability: Not on file  . Transportation needs    Medical: Not on file    Non-medical: Not on file  Tobacco Use  . Smoking status: Current Some Day Smoker    Packs/day: 0.30    Types: Cigarettes  . Smokeless tobacco: Current User  Substance and Sexual Activity  . Alcohol use: No  . Drug use: Not on file  . Sexual activity:  Not on file  Lifestyle  . Physical activity    Days per week: Not on file    Minutes per session: Not on file  . Stress: Not on file  Relationships  . Social Herbalist on phone: Not on file    Gets together: Not on file    Attends religious service: Not on file    Active member of club or organization: Not on file    Attends meetings of clubs or  organizations: Not on file    Relationship status: Not on file  . Intimate partner violence    Fear of current or ex partner: Not on file    Emotionally abused: Not on file    Physically abused: Not on file    Forced sexual activity: Not on file  Other Topics Concern  . Not on file  Social History Narrative  . Not on file    FAMILY HISTORY: Family History  Problem Relation Age of Onset  . Stroke Mother     ALLERGIES:  has No Known Allergies.  MEDICATIONS:  Current Outpatient Medications  Medication Sig Dispense Refill  . oxyCODONE (OXY IR/ROXICODONE) 5 MG immediate release tablet Take 20 mg by mouth every 4 (four) hours as needed for severe pain. Takes 1 tablet in the morning, 1.5 tablets in the afternoon and 1 tablet @@ night    . polyethylene glycol (MIRALAX / GLYCOLAX) 17 g packet Take 17 g by mouth daily. As needed    . feeding supplement, ENSURE ENLIVE, (ENSURE ENLIVE) LIQD Take 237 mLs by mouth 2 (two) times daily between meals. 237 mL 0  . ferrous sulfate 325 (65 FE) MG tablet Take 1 tablet (325 mg total) by mouth daily with breakfast. 30 tablet 0  . MELATONIN PO Take by mouth.     No current facility-administered medications for this visit.     REVIEW OF SYSTEMS:   Constitutional: ( - ) fevers, ( - )  chills , ( - ) night sweats Eyes: ( - ) blurriness of vision, ( - ) double vision, ( - ) watery eyes Ears, nose, mouth, throat, and face: ( - ) mucositis, ( - ) sore throat Respiratory: ( - ) cough, ( - ) dyspnea, ( - ) wheezes Cardiovascular: ( - ) palpitation, ( - ) chest discomfort, ( - ) lower extremity swelling Gastrointestinal:  ( - ) nausea, ( - ) heartburn, ( + ) change in bowel habits Skin: ( - ) abnormal skin rashes Lymphatics: ( - ) new lymphadenopathy, ( - ) easy bruising Neurological: ( - ) numbness, ( - ) tingling, ( - ) new weaknesses Behavioral/Psych: ( - ) mood change, ( - ) new changes  All other systems were reviewed with the patient and are  negative.  PHYSICAL EXAMINATION: ECOG PERFORMANCE STATUS: 3 - Symptomatic, >50% confined to bed  Vitals:   12/19/18 1318  BP: 92/60  Pulse: (!) 105  Resp: 18  Temp: 98 F (36.7 C)  SpO2: 100%   Filed Weights   12/19/18 1318  Weight: 140 lb 1.6 oz (63.5 kg)    GENERAL: chronically ill appearing middle aged male in NAD, laying on exam table. SKIN: skin color, texture, turgor are normal, no rashes or significant lesions EYES: conjunctiva are pink and non-injected, sclera clear WOUND: patient declined multiple times for examination of his wound. LYMPH:  no palpable lymphadenopathy in the cervical, axillary or inguinal LUNGS: clear to auscultation and percussion with normal  breathing effort HEART: regular rate & rhythm and no murmurs and no lower extremity edema ABDOMEN: soft, non-tender, non-distended, normal bowel sounds Musculoskeletal: no cyanosis of digits and no clubbing  PSYCH: alert & oriented x 3, fluent speech NEURO: no focal motor/sensory deficits  LABORATORY DATA:  I have reviewed the data as listed Lab Results  Component Value Date   WBC 11.5 (H) 12/19/2018   HGB 7.0 (L) 12/19/2018   HCT 23.2 (L) 12/19/2018   MCV 68.0 (L) 12/19/2018   PLT 585 (H) 12/19/2018   NEUTROABS 9.4 (H) 12/19/2018    PATHOLOGY: Case Report Surgical Pathology Report             Case: [id number]                 Authorizing Provider: Margrett Rud, MD  Collected:      11/14/2016 1624       Ordering Location:   [facility]         Received:      11/15/2016 0957       Pathologist:      Jonn Shingles, MD                            Specimens:  A) - Anus, Anal mass biopsy                                     B) - Penis, Distal foreskin                                Final Diagnosis A: Skin mass, peri-anal, biopsy  - Squamous  cell carcinoma, moderately differentiated, non- keratinizing, multiple pieces Size: At least 1.2 cm Histologic grade: 2/4 Anatomic level: At least IV Thickness: At least 3.5 mm Perineural invasion: None Margins: Present in edges of tissue fragments       RADIOGRAPHIC STUDIES: Currently working toward obtaining the reports of imaging from [facility]. Will add addendum once obtained.   ASSESSMENT & PLAN Dylan [name] 47 y.o. male with medical history significant for non-Hodgkin lymphoma (treated in 1999 with chemo alone) who presents for evaluation of metastatic anal cancer. His course has been markedly complicated and handled at a multitude of different institutions.  At this time we need to assure that he has completed re-staging scans to determine the extent of his disease. The next best step would be to have surgery consider a colonic diversion (with ostomy), because at the moment he is passing urine into his rectum. This places him at high infection risk and coupled with his uncontrolled wound bleeding make him a poor candidate for systemic treatment. Once these have been addressed and stabilized we can have further discussions about additional treatment.   On discussion with Dylan [name] today he notes that he was unaware this was an incurable, life-limiting condition. I am unsure if he has not understood the discussions he has had with other oncological services. He also has sought the opinion of numerous health care systems (with [place], [place] of [place]), which is indicative he is searching for alternative answers for the problems he is facing. [name] is the closest to where the patient lives, and naturally we can try to take the lead role in  his care.   #Metastatic Anal Cancer --assure that PET, CT Chest, and MRI abdomen were performed previously (care everywhere has them listed with no reports). If these have not been performed,  will schedule them here promptly. --the primary issue that should be addressed prior to considering treatment is his bleeding and passing urine through his anus. I agree with Dr. Dennison Nancy of [facility] that a diversion would be the next best step in his treatment. --with his active blood loss and open wound with poor functional status he is not currently a good candidate for chemotherapy. If we can address the bleeding and diversion then we can consider treatment thereafter.  --RTC in 2 weeks. In the interim will discuss with his prior surgical services if a diversion is possible.   #Iron Deficiency Anemia Secondary to Blood Loss --patient had Hgb 7.0 with WBC 11.5, Plt 585 with an MCV 68. Iron panel shows undetectable iron and iron sats. His ferritin is elevated at 92, likely falsely elevated in the setting of inflammation from the wound.  --patient will require transfusion if Hgb drops below 7.0. Will discuss starting IV iron at the next visit.  --continue to monitor   Orders Placed This Encounter  Procedures  . CBC with Differential (Cancer Center Only)    Standing Status:   Future    Number of Occurrences:   1    Standing Expiration Date:   12/19/2019  . CMP ([facility] only)    Standing Status:   Future    Number of Occurrences:   1    Standing Expiration Date:   12/19/2019  . Lactate dehydrogenase (LDH)    Standing Status:   Future    Number of Occurrences:   1    Standing Expiration Date:   12/19/2019  . Magnesium    Standing Status:   Future    Number of Occurrences:   1    Standing Expiration Date:   12/19/2019  . Iron and TIBC    Standing Status:   Future    Number of Occurrences:   1    Standing Expiration Date:   12/19/2019  . Ferritin    Standing Status:   Future    Number of Occurrences:   1    Standing Expiration Date:   12/19/2019  . CEA (IN HOUSE-[facility])    Standing Status:   Future    Number of Occurrences:   1    Standing Expiration Date:   12/19/2019    All  questions were answered. The patient knows to call the clinic with any problems, questions or concerns.  A total of more than 60 minutes were spent face-to-face with the patient during this encounter and over half of that time was spent on counseling and coordination of care as outlined above.   Ledell Peoples, MD Department of Hematology/Oncology [place] [facility] Phone: [phone] Pager: [phone] Email: [email or web address]   12/21/2018 8:00 PM

## 2018-12-21 NOTE — Telephone Encounter (Signed)
Confirmed 11/20 appointment with patient.

## 2018-12-24 NOTE — Telephone Encounter (Signed)
Sharyn Lull called in requesting immediate cb for verbals orders from Dr. Carlota Raspberry

## 2018-12-25 ENCOUNTER — Other Ambulatory Visit: Payer: Self-pay | Admitting: Hematology and Oncology

## 2018-12-25 DIAGNOSIS — C210 Malignant neoplasm of anus, unspecified: Secondary | ICD-10-CM

## 2019-01-02 MED FILL — METHADONE 5 MG/5 ML SOLN: 5 | 7 days supply | Qty: 28 | Fill #0

## 2019-01-03 NOTE — Progress Notes (Signed)
[name] [phone]   [phone]  PROGRESS NOTE  Patient Care Team: [name], MD as PCP - General (Family Medicine) [name], NP as Nurse Practitioner (Hospice and Palliative Medicine)  Hematological/Oncological History # Metastatic Anal Cancer (summarized history obtained from [facility] Notes) 1) 03/09/10: CT scan performed showed superficial inflammation along the medial crease of the left buttocks within the subcutaneous fat measuring 2.5 x 5 cm including no areas of deep penetration with a few associated bubbles of air and gas present.  2) 03/10/10: A 2 x 2 cm area of condylomata was present in the perianal area that was excised by Dr. [name], along with multiple I&Ds of left gluteal abscesses. 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 No lymph nodes biopsied. 3) 03/29/10: PET scan showed abnormal 5.7 x 7.1 cm hypermetabolic soft tissue in the perianal region is compatible with patient's history of squamous cell carcinoma. Bilateral inguinal adenopathy, largest on left is hypermetabolic and measures 1.8 x 2.9 cm, and largest on the right measures 1.2 cm without hypermetabolism; question secondary to infection or metastatic involvement 4) 04/2010: Evaluated by Radiation Oncologist Dr. [name] at [facility]. He was not deemed a candidate for radiation given concern for metastatic disease based on PET scan. 5) 06/22/10: Recommended to begin systemic therapy with cisplatin and continuous infusion 5-FU for metastatic disease. 6) 07/2010: Re-evaluated at [facility] by Dr. [name] and Dr. [name], as he did not have biopsy proven metastatic disease, and inguinal adenopathy may be reactive due to infection/inflammation/abscess. 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 9) 02/10/14: After lost to follow-up, he returned to [facility] for evaluation by Dr. [name] due to concern for recurrence of disease throughout the perineum and perianal areas. The most worrisome is a large ulcer on the left perianal area.  10) 02/18/14: Exam under anesthesia with biopsies performed. Left perianal region biopsy confirmed invasive squamous cell carcinoma, moderate-to-poorly differentiated. Patient refused at this time APR. 11) 05/01/14: PET CT: Perianal and left groin/penile malignancy with bilateral inguinal and left external iliac metastases. 12) 07/07/14 - 09/03/14: XRT. Patient declined recommended concurrent 5FU/[facility]. At [place]. Patient refused APR. 13) 12/19/16: PET CT: No suspicious metabolically active osseous lesions are identified - Interval increase in the extent of the hypermetabolic activity involving the region of the anus which extends up into the fascia adjacent the rectum and out into the bilateral buttocks associated with soft tissue erosion and skin thickening. 14) 12/19/16: MRI Pelvis: Motion artefact. Anal tumor with gross invasion of the anal sphincters and extension into the ischioanal fossa, with irregular appearance of the perineum/gluteal skin, also abutting the pelvic sidewall (left > right) and likely invading prostate apex. Overall, this is increased from prior CT 01/05/2015, concerning for disease progression. Exam revealed obliteration of the anus. Patient refused APR. 15) 03/2018 - consideration for pembrolizumab - patient declined. 16) 10/2018 - represents with substantial bleeding episodes from erosive, locally-advanced anal cancer  17) 11/12/2018: reviewed by [facility] surgery again. Admitted for consideration of ostomy from 10/4 to 11/21/2018. 18) 12/10/18-12/12/18: patient seen by  [facility] (Dr. Dennison Nancy) and Surgical Oncology.  MRI pelvis, and CT chest performed. 19) 12/19/2018: establish care with Dr.  Perris Tripathi  20)  12/21/2018: 4th opinion received at [facility], [place] with Dr. [name].   #High Grade Non-Hodgkins Lymphoma 1)1999: He was treated for high-grade Non-Hodgkin's Lymphoma by Dr. [name] in [place], [place]. He received a year of chemotherapy. No radiation.   Interval History:  Dylan [name] 48 y.o. male with medical history significant for metastatic anal cancer presents for a follow up visit. He was last seen in our clinic on 12/21/2018.  In the interim since his last visit he was seen by Dr. [name] at the [facility] in [facility] for an additional opinion. I was able to make contact with the [facility] surgical oncology team who noted that a diverting ileostomy was offered during their last visit, but that the patient declined.   On exam today he notes not much is changed in the interim.  While in [place] he did have stomach cramps due to GI distress, however this has resolved since that time.  He has continued to lose weight with a another 5 pound weight loss since his last visit.  He notes that the bleeding from his backside wound has slowed down and that he changes his depends every other day.  He notes his appetite is good and he has continued to eat well.  He denies having any pain at this time.  He denies any fevers, chills, sweats and notes that he has not had any diarrhea or nausea. A full 10 point ROS is listed below.  Today we had a frank discussion about the state of his wound and the prospect of future therapy.  I noted that we would not be able to consider any further therapy for him if surgical evaluation could not be undertaken.  With his massive open wound and fecal and urinary incontinence he is at extraordinarily high infection risk.  He was agreeable to being reassessed by the surgical team at [place] is listed  below.  MEDICAL HISTORY:  Past Medical History:  Diagnosis Date   Cancer ([place]) 2018   anal cancer   S/P radiation therapy 07/07/14-7/20/216   anal ca     SURGICAL HISTORY: No past surgical history on file.  SOCIAL HISTORY: Social History   Socioeconomic History   Marital status: Single    Spouse name: Not on file   Number of children: Not on file   Years of education: Not on file   Highest education level: Not on file  Occupational History   Not on file  Social Needs   Financial resource strain: Not on file   Food insecurity    Worry: Not on file    Inability: Not on file   Transportation needs    Medical: Not on file    Non-medical: Not on file  Tobacco Use   Smoking status: Current Some Day Smoker    Packs/day: 0.30    Types: Cigarettes   Smokeless tobacco: Current User  Substance and Sexual Activity   Alcohol use: No   Drug use: Not on file   Sexual activity: Not on file  Lifestyle   Physical activity    Days per week: Not on file    Minutes per session: Not on file   Stress: Not on file  Relationships   Social connections    Talks on phone: Not on file    Gets together: Not on file    Attends religious service: Not on file    Active member of  club or organization: Not on file    Attends meetings of clubs or organizations: Not on file    Relationship status: Not on file   Intimate partner violence    Fear of current or ex partner: Not on file    Emotionally abused: Not on file    Physically abused: Not on file    Forced sexual activity: Not on file  Other Topics Concern   Not on file  Social History Narrative   Not on file    FAMILY HISTORY: Family History  Problem Relation Age of Onset   Stroke Mother     ALLERGIES:  has No Known Allergies.  MEDICATIONS:  Current Outpatient Medications  Medication Sig Dispense Refill   feeding supplement, ENSURE ENLIVE, (ENSURE ENLIVE) LIQD Take 237 mLs by mouth 2 (two) times daily  between meals. 237 mL 0   ferrous sulfate 325 (65 FE) MG tablet Take 1 tablet (325 mg total) by mouth daily with breakfast. 30 tablet 0   MELATONIN PO Take by mouth.     oxyCODONE (OXY IR/ROXICODONE) 5 MG immediate release tablet Take 20 mg by mouth every 4 (four) hours as needed for severe pain. Takes 1 tablet in the morning, 1.5 tablets in the afternoon and 1 tablet @@ night     polyethylene glycol (MIRALAX / GLYCOLAX) 17 g packet Take 17 g by mouth daily. As needed     No current facility-administered medications for this visit.     REVIEW OF SYSTEMS:   Constitutional: ( - ) fevers, ( - )  chills , ( - ) night sweats Eyes: ( - ) blurriness of vision, ( - ) double vision, ( - ) watery eyes Ears, nose, mouth, throat, and face: ( - ) mucositis, ( - ) sore throat Respiratory: ( - ) cough, ( - ) dyspnea, ( - ) wheezes Cardiovascular: ( + ) palpitation, ( - ) chest discomfort, ( - ) lower extremity swelling Gastrointestinal:  ( - ) nausea, ( - ) heartburn, ( - ) change in bowel habits Skin: ( - ) abnormal skin rashes Lymphatics: ( - ) new lymphadenopathy, ( - ) easy bruising Neurological: ( - ) numbness, ( - ) tingling, ( - ) new weaknesses Behavioral/Psych: ( - ) mood change, ( - ) new changes  All other systems were reviewed with the patient and are negative.  PHYSICAL EXAMINATION: ECOG PERFORMANCE STATUS: 3 - Symptomatic, >50% confined to bed  Vitals:   01/04/19 1119  BP: (!) 92/56  Pulse: (!) 142  Resp: 18  Temp: 98.2 F (36.8 C)  SpO2: 100%   Filed Weights   01/04/19 1119  Weight: 135 lb 8 oz (61.5 kg)    GENERAL: chronically ill appearing middle aged [place] American male. alert, no distress and comfortable SKIN: skin color, texture, turgor are normal, no rashes or significant lesions EYES: conjunctiva are pink and non-injected, sclera clear LUNGS: clear to auscultation and percussion with normal breathing effort HEART: regular rate & rhythm and no murmurs and no  lower extremity edema ABDOMEN: soft, non-tender, non-distended, normal bowel sounds WOUND: large cavernous wound tracking from the glutteal cleft forward to the scrotum. No active oozing or purulence noted. Evidence of fecal incontinence. Malodorous.  Musculoskeletal: no cyanosis of digits and no clubbing  PSYCH: alert & oriented x 3, fluent speech NEURO: no focal motor/sensory deficits  LABORATORY DATA:  I have reviewed the data as listed Recent Results (from the past 2160 hour(s))  CBC  Status: Abnormal   Collection Time: 10/17/18  2:46 PM  Result Value Ref Range   WBC 13.1 (H) 3.4 - 10.8 x10E3/uL   RBC 3.76 (L) 4.14 - 5.80 x10E6/uL   Hemoglobin 7.4 (L) 13.0 - 17.7 g/dL   Hematocrit 23.3 (L) 37.5 - 51.0 %   MCV 62 (L) 79 - 97 fL   MCH 19.7 (L) 26.6 - 33.0 pg   MCHC 31.8 31.5 - 35.7 g/dL   RDW 17.2 (H) 11.6 - 15.4 %   Platelets 621 (H) 150 - 450 [id number]  Basic metabolic panel     Status: Abnormal   Collection Time: 10/17/18  3:15 PM  Result Value Ref Range   Glucose 92 65 - 99 mg/dL   BUN 11 6 - 24 mg/dL   Creatinine, Ser 0.77 0.76 - 1.27 mg/dL   GFR calc non Af Amer 106 >59 mL/min/1.73   GFR calc Af Amer 122 >59 mL/min/1.73   BUN/Creatinine Ratio 14 9 - 20   Sodium 132 (L) 134 - 144 mmol/L   Potassium 4.8 3.5 - 5.2 mmol/L   Chloride 95 (L) 96 - 106 mmol/L   CO2 23 20 - 29 mmol/L   Calcium 9.6 8.7 - 10.2 mg/dL  TSH     Status: None   Collection Time: 10/17/18  3:15 PM  Result Value Ref Range   TSH 1.680 0.450 - 4.500 uIU/mL  Basic metabolic panel     Status: Abnormal   Collection Time: 10/18/18  2:15 PM  Result Value Ref Range   Sodium 133 (L) 135 - 145 mmol/L   Potassium 4.4 3.5 - 5.1 mmol/L   Chloride 98 98 - 111 mmol/L   CO2 23 22 - 32 mmol/L   Glucose, Bld 106 (H) 70 - 99 mg/dL   BUN 10 6 - 20 mg/dL   Creatinine, Ser 0.99 0.61 - 1.24 mg/dL   Calcium 9.6 8.9 - 10.3 mg/dL   GFR calc non Af Amer >60 >60 mL/min   GFR calc Af Amer >60 >60 mL/min   Anion gap 12  5 - 15    Comment: Performed at [facility], [place] [address], [place], [place] [address]  CBC     Status: Abnormal   Collection Time: 10/18/18  2:15 PM  Result Value Ref Range   WBC 12.0 (H) 4.0 - 10.5 K/uL   RBC 3.92 (L) 4.22 - 5.81 MIL/uL   Hemoglobin 7.7 (L) 13.0 - 17.0 g/dL    Comment: Reticulocyte Hemoglobin testing may be clinically indicated, consider ordering this additional test [id number]    HCT 26.3 (L) 39.0 - 52.0 %   MCV 67.1 (L) 80.0 - 100.0 fL   MCH 19.6 (L) 26.0 - 34.0 pg   MCHC 29.3 (L) 30.0 - 36.0 g/dL   RDW 16.6 (H) 11.5 - 15.5 %   Platelets 625 (H) 150 - 400 K/uL   nRBC 0.0 0.0 - 0.2 %    Comment: Performed at [facility], [place] [address], [place], [place] [address]  Hepatic function panel     Status: Abnormal   Collection Time: 10/18/18  2:15 PM  Result Value Ref Range   Total Protein 8.2 (H) 6.5 - 8.1 g/dL   Albumin 2.2 (L) 3.5 - 5.0 g/dL   AST 14 (L) 15 - 41 U/L   ALT 24 0 - 44 U/L   Alkaline Phosphatase 103 38 - 126 U/L   Total Bilirubin 0.1 (L) 0.3 - 1.2 mg/dL  Bilirubin, Direct <0.1 0.0 - 0.2 mg/dL   Indirect Bilirubin NOT CALCULATED 0.3 - 0.9 mg/dL    Comment: Performed at [facility], [place] [address] [place], [place] [address]  Urinalysis, Routine w reflex microscopic     Status: Abnormal   Collection Time: 10/18/18  2:25 PM  Result Value Ref Range   Color, Urine YELLOW YELLOW   APPearance CLEAR CLEAR   Specific Gravity, Urine 1.020 1.005 - 1.030   pH 6.0 5.0 - 8.0   Glucose, UA NEGATIVE NEGATIVE mg/dL   Hgb urine dipstick NEGATIVE NEGATIVE   Bilirubin Urine NEGATIVE NEGATIVE   Ketones, ur NEGATIVE NEGATIVE mg/dL   Protein, ur 30 (A) NEGATIVE mg/dL   Nitrite NEGATIVE NEGATIVE   Leukocytes,Ua TRACE (A) NEGATIVE   RBC / HPF 0-5 0 - 5 RBC/hpf   WBC, UA 0-5 0 - 5 WBC/hpf   Bacteria, UA RARE (A) NONE SEEN   Mucus PRESENT     Comment: Performed at [facility], [place] [address] [place], [place] [address]  Urine culture      Status: None   Collection Time: 10/18/18  2:25 PM   Specimen: Urine, Random  Result Value Ref Range   Specimen Description URINE, RANDOM    Special Requests      NONE Performed at [facility], [place] [address] [place], [place] [address]    Culture      Multiple bacterial morphotypes present, none predominant. Suggest appropriate recollection if clinically indicated.   Report Status 10/19/2018 FINAL   Blood culture (routine x 2)     Status: None   Collection Time: 10/18/18  2:35 PM   Specimen: BLOOD  Result Value Ref Range   Specimen Description BLOOD BLOOD LEFT FOREARM    Special Requests      BOTTLES DRAWN AEROBIC AND ANAEROBIC Blood Culture adequate volume   Culture      NO GROWTH 5 DAYS Performed at [facility], [place] [address], [place], [place] [address]    Report Status 10/23/2018 FINAL   Lactic acid, plasma     Status: Abnormal   Collection Time: 10/18/18  2:37 PM  Result Value Ref Range   Lactic Acid, Venous 3.6 (HH) 0.5 - 1.9 mmol/L    Comment: CRITICAL RESULT CALLED TO, READ BACK BY AND VERIFIED WITH: Pilar Plate 1516 10/18/2018 D BRADLEY Performed at [facility], [place] [address], [place], [place] [address]   Blood culture (routine x 2)     Status: None   Collection Time: 10/18/18  2:37 PM   Specimen: BLOOD  Result Value Ref Range   Specimen Description BLOOD RIGHT ANTECUBITAL    Special Requests      BOTTLES DRAWN AEROBIC AND ANAEROBIC Blood Culture results may not be optimal due to an excessive volume of blood received in culture bottles   Culture      NO GROWTH 5 DAYS Performed at [facility] [address] [place], [place] [address]    Report Status 10/23/2018 FINAL   Type and screen     Status: None   Collection Time: 10/18/18  2:37 PM  Result Value Ref Range   ABO/RH(D) O POS    Antibody Screen NEG    Sample Expiration 10/21/2018,2359    Unit Number [id number]    Blood Component Type RED CELLS,LR    Unit division 00     Status of Unit ISSUED,FINAL    Transfusion Status OK  TO TRANSFUSE    Crossmatch Result      Compatible Performed at [facility], [place] [address], [place], [place] [address]   BPAM RBC     Status: None   Collection Time: 10/18/18  2:37 PM  Result Value Ref Range   ISSUE DATE / TIME [id number]    Blood Product Unit Number [id number]    PRODUCT CODE [id number]    Unit Type and Rh 5100    Blood Product Expiration Date [id number]   POC occult blood, ED     Status: None   Collection Time: 10/18/18  3:06 PM  Result Value Ref Range   Fecal Occult Bld NEGATIVE NEGATIVE  Vitamin B12     Status: None   Collection Time: 10/18/18  3:30 PM  Result Value Ref Range   Vitamin B-12 357 180 - 914 pg/mL    Comment: (NOTE) This assay is not validated for testing neonatal or myeloproliferative syndrome specimens for Vitamin B12 levels. Performed at [facility], [place] [address], [place], [place] [address]   Iron and TIBC     Status: Abnormal   Collection Time: 10/18/18  3:30 PM  Result Value Ref Range   Iron 11 (L) 45 - 182 ug/dL   TIBC 214 (L) 250 - 450 ug/dL   Saturation Ratios 5 (L) 17.9 - 39.5 %   UIBC 203 ug/dL    Comment: Performed at [place] [address], [place], [place] [address]  Ferritin     Status: None   Collection Time: 10/18/18  3:30 PM  Result Value Ref Range   Ferritin 25 24 - 336 ng/mL    Comment: Performed at [facility], [place] [address], [place], [place] [address]  Reticulocytes     Status: Abnormal   Collection Time: 10/18/18  3:30 PM  Result Value Ref Range   Retic Ct Pct 1.5 0.4 - 3.1 %   RBC. 3.88 (L) 4.22 - 5.81 MIL/uL   Retic Count, Absolute 58.2 19.0 - 186.0 K/uL   Immature Retic Fract 28.7 (H) 2.3 - 15.9 %    Comment: Performed at [facility] [address], [place], [place] [address]  Folate     Status: None   Collection Time: 10/18/18  3:30 PM  Result Value Ref Range   Folate 9.6 >5.9 ng/mL    Comment: Performed  at [facility], [place] [address] [place], [place] [address]  Lactic acid, plasma     Status: None   Collection Time: 10/18/18  5:46 PM  Result Value Ref Range   Lactic Acid, Venous 1.1 0.5 - 1.9 mmol/L    Comment: Performed at [place] [address] [place], [place] [address]  Troponin I (High Sensitivity)     Status: None   Collection Time: 10/18/18  8:07 PM  Result Value Ref Range   Troponin I (High Sensitivity) 4 <18 ng/L    Comment: (NOTE) Elevated high sensitivity troponin I (hsTnI) values and significant  changes across serial measurements may suggest ACS but many other  chronic and acute conditions are known to elevate hsTnI results.  Refer to the "Links" section for chest pain algorithms and additional  guidance. Performed at [facility], [place] [address], [place], [place] [address]   Cortisol     Status: None   Collection Time: 10/18/18  8:07 PM  Result Value Ref Range   Cortisol, Plasma 9.2 ug/dL  Comment: (NOTE) AM    6.7 - 22.6 ug/dL PM   <10.0       ug/dL Performed at [facility] [address], [place], [place] [address]   HIV antibody (Routine Testing)     Status: None   Collection Time: 10/18/18  9:01 PM  Result Value Ref Range   HIV Screen 4th Generation wRfx Non Reactive Non Reactive    Comment: (NOTE) Performed At: [facility] [place], [place] [id number] Rush Farmer MD [id number]   SARS CORONAVIRUS 2 (TAT 6-24 HRS) Nasopharyngeal Nasopharyngeal Swab     Status: None   Collection Time: 10/18/18  9:24 PM   Specimen: Nasopharyngeal Swab  Result Value Ref Range   SARS Coronavirus 2 NEGATIVE NEGATIVE    Comment: (NOTE) SARS-CoV-2 target nucleic acids are NOT DETECTED. The SARS-CoV-2 RNA is generally detectable in upper and lower respiratory specimens during the acute phase of infection. Negative results do not preclude SARS-CoV-2 infection, do not rule out co-infections with other pathogens,  and should not be used as the sole basis for treatment or other patient management decisions. Negative results must be combined with clinical observations, patient history, and epidemiological information. The expected result is Negative. Fact Sheet for Patients: SugarRoll.be Fact Sheet for Healthcare Providers: https://www.woods-mathews.com/ This test is not yet approved or cleared by the [place] FDA and  has been authorized for detection and/or diagnosis of SARS-CoV-2 by FDA under an Emergency Use Authorization (EUA). This EUA will remain  in effect (meaning this test can be used) for the duration of the COVID-19 declaration under Section 56 4(b)(1) of the Act, 21 U.S.C. section 360bbb-3(b)(1), unless the authorization is terminated or revoked sooner. Performed at [facility], [place] [address], [place], [place] [address]   Troponin I (High Sensitivity)     Status: None   Collection Time: 10/18/18 11:29 PM  Result Value Ref Range   Troponin I (High Sensitivity) 4 <18 ng/L    Comment: (NOTE) Elevated high sensitivity troponin I (hsTnI) values and significant  changes across serial measurements may suggest ACS but many other  chronic and acute conditions are known to elevate hsTnI results.  Refer to the "Links" section for chest pain algorithms and additional  guidance. Performed at [facility], [place] [address] [place], [place] [address]   Comprehensive metabolic panel     Status: Abnormal   Collection Time: 10/19/18  2:24 AM  Result Value Ref Range   Sodium 133 (L) 135 - 145 mmol/L   Potassium 3.7 3.5 - 5.1 mmol/L   Chloride 104 98 - 111 mmol/L   CO2 21 (L) 22 - 32 mmol/L   Glucose, Bld 107 (H) 70 - 99 mg/dL   BUN 8 6 - 20 mg/dL   Creatinine, Ser 0.82 0.61 - 1.24 mg/dL   Calcium 8.5 (L) 8.9 - 10.3 mg/dL   Total Protein 6.8 6.5 - 8.1 g/dL   Albumin 1.7 (L) 3.5 - 5.0 g/dL   AST 11 (L) 15 - 41 U/L   ALT 19 0 - 44  U/L   Alkaline Phosphatase 84 38 - 126 U/L   Total Bilirubin 0.1 (L) 0.3 - 1.2 mg/dL   GFR calc non Af Amer >60 >60 mL/min   GFR calc Af Amer >60 >60 mL/min   Anion gap 8 5 - 15    Comment: Performed at [facility], [place] [address], [place], [place] [address]  CBC  Status: Abnormal   Collection Time: 10/19/18  2:24 AM  Result Value Ref Range   WBC 9.7 4.0 - 10.5 K/uL   RBC 3.26 (L) 4.22 - 5.81 MIL/uL   Hemoglobin 6.5 (LL) 13.0 - 17.0 g/dL    Comment: REPEATED TO VERIFY Reticulocyte Hemoglobin testing may be clinically indicated, consider ordering this additional test [id number] THIS CRITICAL RESULT HAS VERIFIED AND BEEN CALLED TO RN WALTER,N. BY MESSAN HOUEGNIFIO ON 09 04 2020 AT 0347, AND HAS BEEN READ BACK.     HCT 21.8 (L) 39.0 - 52.0 %   MCV 66.9 (L) 80.0 - 100.0 fL   MCH 19.9 (L) 26.0 - 34.0 pg   MCHC 29.8 (L) 30.0 - 36.0 g/dL   RDW 16.3 (H) 11.5 - 15.5 %   Platelets 533 (H) 150 - 400 K/uL   nRBC 0.0 0.0 - 0.2 %    Comment: Performed at [facility] [address] [place], [place] [address]  Sedimentation rate     Status: Abnormal   Collection Time: 10/19/18  2:24 AM  Result Value Ref Range   Sed Rate 101 (H) 0 - 16 mm/hr    Comment: Performed at [facility] [address], [place], [place] [address]  Prepare RBC     Status: None   Collection Time: 10/19/18  2:57 AM  Result Value Ref Range   Order Confirmation      ORDER PROCESSED BY BLOOD BANK Performed at [facility], [place] [address], [place], [place] [address]   MRSA PCR Screening     Status: None   Collection Time: 10/19/18  7:36 AM   Specimen: Nasal Mucosa; Nasopharyngeal  Result Value Ref Range   MRSA by PCR NEGATIVE NEGATIVE    Comment:        The GeneXpert MRSA Assay (FDA approved for NASAL specimens only), is one component of a comprehensive MRSA colonization surveillance program. It is not intended to diagnose MRSA infection nor to guide or monitor treatment for MRSA  infections. Performed at [facility], [place] [address], [place], [place] [address]   ECHOCARDIOGRAM COMPLETE     Status: None   Collection Time: 10/19/18  8:48 AM  Result Value Ref Range   Weight 2,363.33 oz   Height 73 in   BP 90/55 mmHg  Hemoglobin and hematocrit, blood     Status: Abnormal   Collection Time: 10/19/18  1:17 PM  Result Value Ref Range   Hemoglobin 7.6 (L) 13.0 - 17.0 g/dL   HCT 24.4 (L) 39.0 - 52.0 %    Comment: Performed at [facility] [address], [place], [place] [address]  Culture, Urine     Status: None   Collection Time: 10/19/18  4:46 PM   Specimen: Urine, Clean Catch  Result Value Ref Range   Specimen Description URINE, CLEAN CATCH    Special Requests NONE    Culture      NO GROWTH Performed at [name], [place] [address], [place], [place] [address]    Report Status 10/20/2018 FINAL   Urinalysis, Routine w reflex microscopic     Status: Abnormal   Collection Time: 10/19/18  4:57 PM  Result Value Ref Range   Color, Urine STRAW (A) YELLOW   APPearance CLEAR CLEAR   Specific Gravity, Urine 1.013 1.005 - 1.030   pH 6.0 5.0 - 8.0   Glucose, UA NEGATIVE NEGATIVE mg/dL   Hgb urine dipstick NEGATIVE  NEGATIVE   Bilirubin Urine NEGATIVE NEGATIVE   Ketones, ur NEGATIVE NEGATIVE mg/dL   Protein, ur NEGATIVE NEGATIVE mg/dL   Nitrite NEGATIVE NEGATIVE   Leukocytes,Ua TRACE (A) NEGATIVE   WBC, UA 0-5 0 - 5 WBC/hpf   Bacteria, UA NONE SEEN NONE SEEN   Squamous Epithelial / LPF 0-5 0 - 5    Comment: Performed at [facility] [address], [place], [place] [address]  CBC with Differential/Platelet     Status: Abnormal   Collection Time: 10/20/18  2:19 AM  Result Value Ref Range   WBC 8.0 4.0 - 10.5 K/uL   RBC 3.87 (L) 4.22 - 5.81 MIL/uL   Hemoglobin 8.0 (L) 13.0 - 17.0 g/dL    Comment: Reticulocyte Hemoglobin testing may be clinically indicated, consider ordering this additional test [id number]    HCT 26.1 (L) 39.0 - 52.0 %     MCV 67.4 (L) 80.0 - 100.0 fL   MCH 20.7 (L) 26.0 - 34.0 pg   MCHC 30.7 30.0 - 36.0 g/dL   RDW 17.8 (H) 11.5 - 15.5 %   Platelets 553 (H) 150 - 400 K/uL    Comment: REPEATED TO VERIFY SPECIMEN CHECKED FOR CLOTS    nRBC 0.0 0.0 - 0.2 %   Neutrophils Relative % 71 %   Neutro Abs 5.7 1.7 - 7.7 K/uL   Lymphocytes Relative 18 %   Lymphs Abs 1.4 0.7 - 4.0 K/uL   Monocytes Relative 7 %   Monocytes Absolute 0.5 0.1 - 1.0 K/uL   Eosinophils Relative 3 %   Eosinophils Absolute 0.2 0.0 - 0.5 K/uL   Basophils Relative 0 %   Basophils Absolute 0.0 0.0 - 0.1 K/uL   Immature Granulocytes 1 %   Abs Immature Granulocytes 0.04 0.00 - 0.07 K/uL    Comment: Performed at [facility], [address], [place], [place] [id number]  Basic metabolic panel     Status: Abnormal   Collection Time: 10/20/18  2:19 AM  Result Value Ref Range   Sodium 134 (L) 135 - 145 mmol/L   Potassium 3.4 (L) 3.5 - 5.1 mmol/L   Chloride 104 98 - 111 mmol/L   CO2 22 22 - 32 mmol/L   Glucose, Bld 102 (H) 70 - 99 mg/dL   BUN <5 (L) 6 - 20 mg/dL   Creatinine, Ser 0.79 0.61 - 1.24 mg/dL   Calcium 8.4 (L) 8.9 - 10.3 mg/dL   GFR calc non Af Amer NOT CALCULATED >60 mL/min   GFR calc Af Amer NOT CALCULATED >60 mL/min   Anion gap 8 5 - 15    Comment: Performed at [facility], [place] [address], [place], [place] [address]  CBC with Differential/Platelet     Status: Abnormal   Collection Time: 10/21/18  2:12 AM  Result Value Ref Range   WBC 8.2 4.0 - 10.5 K/uL   RBC 3.66 (L) 4.22 - 5.81 MIL/uL   Hemoglobin 7.5 (L) 13.0 - 17.0 g/dL    Comment: Reticulocyte Hemoglobin testing may be clinically indicated, consider ordering this additional test [id number]    HCT 24.6 (L) 39.0 - 52.0 %   MCV 67.2 (L) 80.0 - 100.0 fL   MCH 20.5 (L) 26.0 - 34.0 pg   MCHC 30.5 30.0 - 36.0 g/dL   RDW 17.6 (H) 11.5 - 15.5 %   Platelets 579 (H) 150 - 400 K/uL    Comment: REPEATED TO VERIFY   nRBC 0.0 0.0 -  0.2 %   Neutrophils Relative %  71 %   Neutro Abs 5.8 1.7 - 7.7 K/uL   Lymphocytes Relative 15 %   Lymphs Abs 1.2 0.7 - 4.0 K/uL   Monocytes Relative 13 %   Monocytes Absolute 1.1 (H) 0.1 - 1.0 K/uL   Eosinophils Relative 1 %   Eosinophils Absolute 0.1 0.0 - 0.5 K/uL   Basophils Relative 0 %   Basophils Absolute 0.0 0.0 - 0.1 K/uL   nRBC 0 0 /100 WBC   Abs Immature Granulocytes 0.00 0.00 - 0.07 K/uL    Comment: Performed at [facility] [address], [place], [place] [id number]  Basic metabolic panel     Status: Abnormal   Collection Time: 10/21/18  2:12 AM  Result Value Ref Range   Sodium 136 135 - 145 mmol/L   Potassium 3.7 3.5 - 5.1 mmol/L   Chloride 105 98 - 111 mmol/L   CO2 22 22 - 32 mmol/L   Glucose, Bld 79 70 - 99 mg/dL   BUN <5 (L) 6 - 20 mg/dL   Creatinine, Ser 0.83 0.61 - 1.24 mg/dL   Calcium 8.4 (L) 8.9 - 10.3 mg/dL   GFR calc non Af Amer >60 >60 mL/min   GFR calc Af Amer >60 >60 mL/min   Anion gap 9 5 - 15    Comment: Performed at [facility] [address], [place], [place] [address]  POCT CBC     Status: Abnormal   Collection Time: 11/07/18  2:50 PM  Result Value Ref Range   WBC 12.0 (A) 4.6 - 10.2 K/uL   Lymph, poc 17.1 (A) 0.6 - 3.4   POC LYMPH PERCENT 3.1 (A) 10 - 50 %L   MID (cbc) 79.8 (A) 0 - 0.9   POC MID % 2.1 0 - 12 %M   POC Granulocyte 0.4 (A) 2 - 6.9   Granulocyte percent 9.6 (A) 37 - 80 %G   RBC 3.89 (A) 4.69 - 6.13 M/uL   Hemoglobin 7.7 (A) 11 - 14.6 g/dL   HCT, POC 25.4 (A) 29 - 41 %   MCV 65.3 (A) 76 - 111 fL   MCH, POC 19.7 (A) 27 - 31.2 pg   MCHC 30.2 (A) 31.8 - 35.4 g/dL   RDW, POC 19.0 %   Platelet Count, POC 588 (A) 142 - 424 K/uL   MPV 6.0 0 - 99.8 fL  CEA (IN HOUSE-[facility])     Status: None   Collection Time: 12/19/18  2:42 PM  Result Value Ref Range   CEA ([facility]-In House) 4.35 0.00 - 5.00 ng/mL    Comment: (NOTE) This test was performed using [employer]'s Chemiluminescent Microparticle Immunoassay. Values obtained from different assay methods  cannot be used interchangeably. Please note that 5-10% of patients who smoke may see CEA levels up to 6.9 ng/mL. Performed at [facility], [place] [address], [place], [place] [address]   Ferritin     Status: None   Collection Time: 12/19/18  2:42 PM  Result Value Ref Range   Ferritin 92 24 - 336 ng/mL    Comment: Performed at [facility], [place] [address], [place], [place] [address]  Iron and TIBC     Status: Abnormal   Collection Time: 12/19/18  2:42 PM  Result Value Ref Range   Iron <5 (L) 42 - 163 ug/dL   TIBC 176 (L) 202 - 409 ug/dL   Saturation Ratios  UNABLE TO CALCULATE 20 - 55 %   UIBC UNABLE TO CALCULATE 117 - 376 ug/dL    Comment: Performed at [facility], [place] [address], [place], [place] [address]  Magnesium     Status: None   Collection Time: 12/19/18  2:42 PM  Result Value Ref Range   Magnesium 1.8 1.7 - 2.4 mg/dL    Comment: Performed at [facility], [place] [address], [place], [place] [address]  Lactate dehydrogenase (LDH)     Status: Abnormal   Collection Time: 12/19/18  2:42 PM  Result Value Ref Range   LDH 71 (L) 98 - 192 U/L    Comment: Performed at [facility], [place] [address], [place], [place] [address]  CMP ([place] only)     Status: Abnormal   Collection Time: 12/19/18  2:42 PM  Result Value Ref Range   Sodium 135 135 - 145 mmol/L   Potassium 4.1 3.5 - 5.1 mmol/L   Chloride 99 98 - 111 mmol/L   CO2 26 22 - 32 mmol/L   Glucose, Bld 84 70 - 99 mg/dL   BUN 10 6 - 20 mg/dL   Creatinine 0.89 0.61 - 1.24 mg/dL   Calcium 9.3 8.9 - 10.3 mg/dL   Total Protein 8.4 (H) 6.5 - 8.1 g/dL   Albumin 2.2 (L) 3.5 - 5.0 g/dL   AST 11 (L) 15 - 41 U/L   ALT 13 0 - 44 U/L   Alkaline Phosphatase 99 38 - 126 U/L   Total Bilirubin <0.2 (L) 0.3 - 1.2 mg/dL   GFR, Est Non Af Am >60 >60 mL/min   GFR, Est AFR Am >60 >60 mL/min   Anion gap 10 5 - 15     Comment: Performed at [facility], [address], [place], [place] [address]  CBC with Differential ([place] Only)     Status: Abnormal   Collection Time: 12/19/18  2:42 PM  Result Value Ref Range   WBC Count 11.5 (H) 4.0 - 10.5 K/uL   RBC 3.41 (L) 4.22 - 5.81 MIL/uL   Hemoglobin 7.0 (L) 13.0 - 17.0 g/dL    Comment: Reticulocyte Hemoglobin testing may be clinically indicated, consider ordering this additional test [id number]    HCT 23.2 (L) 39.0 - 52.0 %   MCV 68.0 (L) 80.0 - 100.0 fL   MCH 20.5 (L) 26.0 - 34.0 pg   MCHC 30.2 30.0 - 36.0 g/dL   RDW 20.3 (H) 11.5 - 15.5 %   Platelet Count 585 (H) 150 - 400 K/uL   nRBC 0.0 0.0 - 0.2 %   Neutrophils Relative % 83 %   Neutro Abs 9.4 (H) 1.7 - 7.7 K/uL   Lymphocytes Relative 10 %   Lymphs Abs 1.2 0.7 - 4.0 K/uL   Monocytes Relative 7 %   Monocytes Absolute 0.8 0.1 - 1.0 K/uL   Eosinophils Relative 0 %   Eosinophils Absolute 0.1 0.0 - 0.5 K/uL   Basophils Relative 0 %   Basophils Absolute 0.0 0.0 - 0.1 K/uL   Immature Granulocytes 0 %   Abs Immature Granulocytes 0.04 0.00 - 0.07 K/uL    Comment: Performed at [facility], [address] [place], [place] [address]  Sample to Blood Bank     Status: None   Collection Time: 01/04/19 10:53 AM  Result Value Ref Range   Blood Bank Specimen SAMPLE AVAILABLE FOR TESTING  Sample Expiration      01/07/2019,2359 Performed at [facility], [place] [address], [place], [place] [address]   CMP ([place] only)     Status: Abnormal   Collection Time: 01/04/19 10:53 AM  Result Value Ref Range   Sodium 135 135 - 145 mmol/L   Potassium 3.7 3.5 - 5.1 mmol/L   Chloride 100 98 - 111 mmol/L   CO2 24 22 - 32 mmol/L   Glucose, Bld 177 (H) 70 - 99 mg/dL   BUN 10 6 - 20 mg/dL   Creatinine 1.00 0.61 - 1.24 mg/dL   Calcium 9.1 8.9 - 10.3 mg/dL   Total Protein 8.4 (H) 6.5 - 8.1 g/dL   Albumin 2.2 (L) 3.5 - 5.0 g/dL   AST 9  (L) 15 - 41 U/L   ALT 10 0 - 44 U/L   Alkaline Phosphatase 90 38 - 126 U/L   Total Bilirubin <0.2 (L) 0.3 - 1.2 mg/dL   GFR, Est Non Af Am >60 >60 mL/min   GFR, Est AFR Am >60 >60 mL/min   Anion gap 11 5 - 15    Comment: Performed at [facility], [place] [address] [place], [place] [address]  CBC with Differential ([place] Only)     Status: Abnormal   Collection Time: 01/04/19 10:53 AM  Result Value Ref Range   WBC Count 11.2 (H) 4.0 - 10.5 K/uL   RBC 3.37 (L) 4.22 - 5.81 MIL/uL   Hemoglobin 6.8 (LL) 13.0 - 17.0 g/dL    Comment: Reticulocyte Hemoglobin testing may be clinically indicated, consider ordering this additional test [id number] THIS CRITICAL RESULT HAS VERIFIED AND BEEN CALLED TO Narda Rutherford, MD BY JENNIFER MEARNS ON 11 20 2020 AT 1111, AND HAS BEEN READ BACK.     HCT 23.1 (L) 39.0 - 52.0 %   MCV 68.5 (L) 80.0 - 100.0 fL   MCH 20.2 (L) 26.0 - 34.0 pg   MCHC 29.4 (L) 30.0 - 36.0 g/dL   RDW 19.4 (H) 11.5 - 15.5 %   Platelet Count 673 (H) 150 - 400 K/uL   nRBC 0.0 0.0 - 0.2 %   Neutrophils Relative % 86 %   Neutro Abs 9.7 (H) 1.7 - 7.7 K/uL   Lymphocytes Relative 10 %   Lymphs Abs 1.2 0.7 - 4.0 K/uL   Monocytes Relative 2 %   Monocytes Absolute 0.3 0.1 - 1.0 K/uL   Eosinophils Relative 1 %   Eosinophils Absolute 0.1 0.0 - 0.5 K/uL   Basophils Relative 0 %   Basophils Absolute 0.0 0.0 - 0.1 K/uL   Immature Granulocytes 1 %   Abs Immature Granulocytes 0.06 0.00 - 0.07 K/uL    Comment: Performed at [facility], [place] [address] [place], [place] [address]  Type and screen     Status: None   Collection Time: 01/04/19 10:53 AM  Result Value Ref Range   ABO/RH(D) O POS    Antibody Screen NEG    Sample Expiration      01/07/2019,2359 Performed at [facility], [place] [address] [place], [place] [address]   Prepare RBC     Status: None   Collection Time: 01/04/19 10:53 AM  Result Value Ref Range    Order Confirmation      ORDER PROCESSED BY BLOOD BANK Performed at [facility], [place] [address], [place],  [address]   ABO/Rh     Status: None  Collection Time: 01/04/19 10:53 AM  Result Value Ref Range   ABO/RH(D)      O POS Performed at [facility], [place] [address] [place], [place] [address]     RADIOGRAPHIC STUDIES: None to review  ASSESSMENT & PLAN Dylan [name] 48 y.o. male with medical history significant for non-Hodgkin lymphoma (treated in 1999 with chemo alone) who presents for evaluation of metastatic anal cancer. His course has been markedly complicated and handled at a multitude of different institutions.  At this time we need to assure that he has completed re-staging scans to determine the extent of his disease. CT Chest and MRI pelvis are available from [facility], however a PET CT scan has been ordered and is pending.  The patient has also been evaluated at [facility] in [place] and had his case reviewed by Dr. [name], who came to a similar conclusion to Dr. Dennison Nancy to and myself.  After discussing with Mr. Roye in detail about how he is not an ideal chemotherapy candidate in his current condition he was agreeable to reconsideration for a colonic diversion.  I have spoken with the [facility] surgical team and they have arranged for follow-up for him on 25 November.  All additional questions and concerns were addressed.  In our clinic wound packing as well as reevaluation of his wound was provided.  I am concerned that given the extent of this wound he may never be a candidate for further treatment, however potentially extensive surgical intervention will make him a better candidate.  #Metastatic Anal Cancer --will require new PET CT as restaging. CT Chest and MRI pelvis are available from [facility].  --the primary issue that should be addressed prior to considering treatment is his bleeding, incontinence and passing urine through his anus. A  diversion would be the next best step in his treatment. --with his active blood loss and open wound with poor functional status he is not currently a candidate for chemotherapy.  I have made it clear to the patient that I cannot consider treatment if his wound, incontinence, and fistula are not under control.  --RTC in 4 weeks to re-evaluate. Assure f/u with [facility] Surgical Team.   #Iron Deficiency Anemia Secondary to Blood Loss --Hgb 6.8 today with elevated Plts. Will transfuse 2 units of PRBC.  --prior Iron panel shows undetectable iron and iron sats. His ferritin is elevated at 92, likely falsely elevated in the setting of inflammation from the wound.  --patient has stopped taking his PO iron pills due to constipation.  --patient will require transfusion if Hgb drops below 7.0. Continue to monitor at each subsequent visit.  --continue to monitor  All questions were answered. The patient knows to call the clinic with any problems, questions or concerns.  A total of more than 40 minutes were spent face-to-face with the patient during this encounter and over half of that time was spent on counseling and coordination of care as outlined above.   Ledell Peoples, MD Department of Hematology/Oncology [place] at [facility] Phone: [phone] Pager: [phone] Email: [email or web address]  01/04/2019 12:27 PM

## 2019-01-04 ENCOUNTER — Inpatient Hospital Stay: Payer: Medicare Other

## 2019-01-04 ENCOUNTER — Other Ambulatory Visit: Payer: Self-pay | Admitting: *Deleted

## 2019-01-04 ENCOUNTER — Other Ambulatory Visit: Payer: Self-pay

## 2019-01-04 ENCOUNTER — Inpatient Hospital Stay (HOSPITAL_BASED_OUTPATIENT_CLINIC_OR_DEPARTMENT_OTHER): Payer: Medicare Other | Admitting: Hematology and Oncology

## 2019-01-04 ENCOUNTER — Other Ambulatory Visit: Payer: Medicare Other | Admitting: Internal Medicine

## 2019-01-04 VITALS — BP 92/56 | HR 142 | Temp 98.2°F | Resp 18 | Ht 73.0 in | Wt 135.5 lb

## 2019-01-04 DIAGNOSIS — R634 Abnormal weight loss: Secondary | ICD-10-CM

## 2019-01-04 DIAGNOSIS — C210 Malignant neoplasm of anus, unspecified: Secondary | ICD-10-CM

## 2019-01-04 DIAGNOSIS — D500 Iron deficiency anemia secondary to blood loss (chronic): Secondary | ICD-10-CM | POA: Diagnosis not present

## 2019-01-04 DIAGNOSIS — Z515 Encounter for palliative care: Secondary | ICD-10-CM

## 2019-01-04 LAB — CBC WITH DIFFERENTIAL (CANCER CENTER ONLY)
Abs Immature Granulocytes: 0.06 10*3/uL (ref 0.00–0.07)
Basophils Absolute: 0 10*3/uL (ref 0.0–0.1)
Basophils Relative: 0 %
Eosinophils Absolute: 0.1 10*3/uL (ref 0.0–0.5)
Eosinophils Relative: 1 %
HCT: 23.1 % — ABNORMAL LOW (ref 39.0–52.0)
Hemoglobin: 6.8 g/dL — CL (ref 13.0–17.0)
Immature Granulocytes: 1 %
Lymphocytes Relative: 10 %
Lymphs Abs: 1.2 10*3/uL (ref 0.7–4.0)
MCH: 20.2 pg — ABNORMAL LOW (ref 26.0–34.0)
MCHC: 29.4 g/dL — ABNORMAL LOW (ref 30.0–36.0)
MCV: 68.5 fL — ABNORMAL LOW (ref 80.0–100.0)
Monocytes Absolute: 0.3 10*3/uL (ref 0.1–1.0)
Monocytes Relative: 2 %
Neutro Abs: 9.7 10*3/uL — ABNORMAL HIGH (ref 1.7–7.7)
Neutrophils Relative %: 86 %
Platelet Count: 673 10*3/uL — ABNORMAL HIGH (ref 150–400)
RBC: 3.37 MIL/uL — ABNORMAL LOW (ref 4.22–5.81)
RDW: 19.4 % — ABNORMAL HIGH (ref 11.5–15.5)
WBC Count: 11.2 10*3/uL — ABNORMAL HIGH (ref 4.0–10.5)
nRBC: 0 % (ref 0.0–0.2)

## 2019-01-04 LAB — CMP (CANCER CENTER ONLY)
ALT: 10 U/L (ref 0–44)
AST: 9 U/L — ABNORMAL LOW (ref 15–41)
Albumin: 2.2 g/dL — ABNORMAL LOW (ref 3.5–5.0)
Alkaline Phosphatase: 90 U/L (ref 38–126)
Anion gap: 11 (ref 5–15)
BUN: 10 mg/dL (ref 6–20)
CO2: 24 mmol/L (ref 22–32)
Calcium: 9.1 mg/dL (ref 8.9–10.3)
Chloride: 100 mmol/L (ref 98–111)
Creatinine: 1 mg/dL (ref 0.61–1.24)
GFR, Est AFR Am: >60 mL/min (ref >60)
GFR, Estimated: >60 mL/min (ref >60)
Glucose, Bld: 177 mg/dL — ABNORMAL HIGH (ref 70–99)
Potassium: 3.7 mmol/L (ref 3.5–5.1)
Sodium: 135 mmol/L (ref 135–145)
Total Bilirubin: <0.2 mg/dL — ABNORMAL LOW (ref 0.3–1.2)
Total Protein: 8.4 g/dL — ABNORMAL HIGH (ref 6.5–8.1)

## 2019-01-04 LAB — PREPARE RBC (CROSSMATCH)

## 2019-01-04 LAB — SAMPLE TO BLOOD BANK

## 2019-01-04 LAB — ABO/RH: ABO/RH(D): O POS

## 2019-01-04 MED ORDER — SODIUM CHLORIDE 0.9% IV SOLUTION
250.0000 mL | Freq: Once | INTRAVENOUS | Status: AC
  Administered 2019-01-04: 1000 mL via INTRAVENOUS
  Filled 2019-01-04: qty 250

## 2019-01-04 MED ORDER — ACETAMINOPHEN 325 MG PO TABS
650.0000 mg | ORAL_TABLET | Freq: Once | ORAL | Status: AC
  Administered 2019-01-04: 650 mg via ORAL

## 2019-01-04 NOTE — Patient Instructions (Signed)

## 2019-01-04 NOTE — Progress Notes (Signed)
Anla wound examined per Dr. Lorenso Courier and myself. Please MD note for description of this extensive wound. Small amount of stool noted on existing wound packing.  Wound cleaned with normal saline and repacked, gently, with ABD pad per pt request. Additional 4x4's added to proximal end of wound adjacent to testicles on the left side, where wound extends along his most inner thigh. No active bleeding noted, no purulent drainage noted. Pt to have blood transfusion today-2 units. Arranged for pt to have a bed instead of a recliner for this. Report given to Rodney Langton, RN

## 2019-01-04 NOTE — Telephone Encounter (Signed)
Verbal orders was given for wound care to [facility], she verbalized understanding.

## 2019-01-05 ENCOUNTER — Encounter: Payer: Self-pay | Admitting: Internal Medicine

## 2019-01-05 LAB — BPAM RBC
Blood Product Expiration Date: 202012212359
Blood Product Expiration Date: 202012212359
ISSUE DATE / TIME: 202011201306
ISSUE DATE / TIME: 202011201306
Unit Type and Rh: 5100
Unit Type and Rh: 5100

## 2019-01-05 LAB — TYPE AND SCREEN
ABO/RH(D): O POS
Antibody Screen: NEGATIVE
Unit division: 0
Unit division: 0

## 2019-01-05 NOTE — Progress Notes (Signed)
Nov 20th, 2020 [facility] Palliative Care Consult Note Telephone: [phone]  Fax: [phone]     Due to the current COVID-19 infection/crises, the family prefer, and have given their verbal consent for, a provider visit via telemedicine. HIPPA policies of confidentially were discussed. Video-audio (telehealth) contact was unable to be done due technical barriers from the patient's side.  PATIENT NAME: Dylan [name] DOB: 18-Feb-1970 MRN: [id number] [place] [address] [phone] [email or web address]   PRIMARY CARE PROVIDER:   [name], MD [address] [place],  [place] [address]   REFERRING PROVIDER:   Fredric Dine, NP [facility] Provider: Dr. Randon Goldsmith, Dr. Marena Chancy   RESPONSIBLE PARTY: (dtr/agent) Chardonay Harkins [phone]. (dtr) Naeem Quillin [phone]. (dtr) Telford Nab [phone]. (dtr) [place] / RECOMMENDATIONS:  1. Advance Care Planning: A. Directives: Patient desires full resuscitative efforts in the event of a cardiopulmonary arrest.  B. Goals of Care: Patient met with his oncologist today; at the time of our conversation he was receiving a transfusion of PRBS's for HBG of 6.8. Patient states he did go to the [facility] clinic in [place] for a second opinion and found the advice consistent with that offered here in [place]. The plan going forward is for patient to proceed with colostomy to divert / prevent fecal matter from contaminating his peri-rectal wound, then proceed with chemo. I believe he mentioned having the colostomy done at [facility].    2. Symptom Management: A. Pain (peri-rectal): Pain free at the time of our conversation. Well managed with prn Oxy 20 q4-6h (averages 3.5 tabs/24hr).  B. Weight loss: Current weight is 135lbs. Over the last month he lost an additional 5 lbs, which is a loss of 65 lbs over the last 9 months. At a height of 6'1" his BMI  18.5 kg/m2. He tries to eat more but has a diminished appetite. I discusses starting an appetite stimulant such as Remeron, but patient wants to discuss this with his oncologist first, at a follow up visit. He asked that I don't reach out, myself. Denies nausea.   3. Cognitive / Functional status: Continues to transfer and ambulate independently, no falls. Continues episodic dizziness when first stands and if moves too quickly. No difficulty with urination. Continues with peri-rectall wound with some episodic bleeding.   4. Family Supports / coping setting of life-threatening illness: Continue to reside with his daughter Cordelia Pen and her 4 daughters ages 91-7. He greatly enjoys his grandchildren and they are a big reason why he wishes to pursue treatment; wants to see them grow up. Mentions he copes by taking "one day at a time" Patient has another daughter/HPOA Chardonay Asante who usually is the child who accompanies patient to his doctor's appointments.    5. Follow up Palliative Care Visit: Tues 02/12/2019 2:30pm. This is not a set scheduled visit; patient wishes me to call and try to catch him, to touch base.  I spent 60 minutes providing this consultation from 4 to 5pm. More than 50% of the time in this consultation was spent coordinating communication.    HISTORY OF PRESENT ILLNESS:  Dylan [name] is a 48 y.o. male with history of Hodgkin's lymphoma (1999; 1 yr chemo), perianal condyloma, and squamous cell carcinoma of the anus who has been lost to follow up for ~1.5 years, admitted to [facility] on 11/18/2018 with several months of bleeding from his perineum and marked progression of disease. Afebrile,  tachycardic on admission, anemic to 7.3. Hospitalized 10/4-10/08/2018: Chest CT: worsening lung nodules c/w metastatic disease. MRI pelvis: locally invasive dx. Not a surgical cal. Rad Onc recommended against further radiation with possible tumor embolization with VIR in future. Med oncology recommended PET scan  OP and chemo. Tx 2 u's PRBCs of Hbg 7.3 01/04/19: Tx'd 2 units PRBSs. Plan a diverting ileostomy  to decreased peri-rectal wound contamination, then chemo This is a f/u Palliative Care Visit from Oct 30th.    CODE STATUS: Full Code   PPS: 70% HOSPICE ELIGIBILITY/DIAGNOSIS: TBD  PAST MEDICAL HISTORY:  Past Medical History:  Diagnosis Date  . Cancer ([place]) 2018   anal cancer  . S/P radiation therapy 07/07/14-7/20/216   anal ca     SOCIAL HX:  Social History   Tobacco Use  . Smoking status: Current Some Day Smoker    Packs/day: 0.30    Types: Cigarettes  . Smokeless tobacco: Current User  Substance Use Topics  . Alcohol use: No    ALLERGIES: No Known Allergies   PERTINENT MEDICATIONS:  Outpatient Encounter Medications as of 01/04/2019  Medication Sig  . feeding supplement, ENSURE ENLIVE, (ENSURE ENLIVE) LIQD Take 237 mLs by mouth 2 (two) times daily between meals.  Marland Kitchen MELATONIN PO Take by mouth.  . oxyCODONE (OXY IR/ROXICODONE) 5 MG immediate release tablet Take 20 mg by mouth every 4 (four) hours as needed for severe pain. Takes 1 tablet in the morning, 1.5 tablets in the afternoon and 1 tablet @@ night  . polyethylene glycol (MIRALAX / GLYCOLAX) 17 g packet Take 17 g by mouth daily. As needed   No facility-administered encounter medications on file as of 01/04/2019.     PHYSICAL EXAM:   PE deferred d/t tele-health/audio only nature of visit. Patient is alert and conversant. No dyspnea with conversation.  [name], NP

## 2019-01-07 ENCOUNTER — Other Ambulatory Visit: Payer: Self-pay

## 2019-01-07 ENCOUNTER — Telehealth: Payer: Self-pay | Admitting: Hematology and Oncology

## 2019-01-07 ENCOUNTER — Telehealth: Payer: Self-pay

## 2019-01-07 NOTE — Telephone Encounter (Signed)
Home health called to inform Dr. Carlota Raspberry that the pt. Had refused to be seen by Home health.

## 2019-01-07 NOTE — Telephone Encounter (Signed)
Scheduled per los. Called and spoke with patient. Confirmed appt 

## 2019-01-23 ENCOUNTER — Telehealth: Payer: Self-pay | Admitting: Family Medicine

## 2019-01-23 NOTE — Telephone Encounter (Signed)
Dylan [name] from [facility] needs verbal orders for medical and social worker evaluation and a physical therapy evaluation. She has secure voice mail at [phone]

## 2019-01-25 NOTE — Telephone Encounter (Signed)
Please Advise on this.pk/CMA

## 2019-01-30 NOTE — Telephone Encounter (Signed)
Verbal orders was given   

## 2019-01-31 NOTE — Progress Notes (Deleted)
[facility] [phone]   [phone]  PROGRESS NOTE  Patient Care Team: [name], MD as PCP - General (Family Medicine) [name], NP as Nurse Practitioner (Hospice and Palliative Medicine)  Hematological/Oncological History # Metastatic Anal Cancer (summarized history obtained from [facility] Surgical Notes) 1) 03/09/10: CT scan performed showed superficial inflammation along the medial crease of the left buttocks within the subcutaneous fat measuring 2.5 x 5 cm including no areas of deep penetration with a few associated bubbles of air and gas present.  2) 03/10/10: A 2 x 2 cm area of condylomata was present in the perianal area that was excised by Dr. [name], along with multiple I&Ds of left gluteal abscesses. 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 No lymph nodes biopsied. 3) 03/29/10: PET scan showed abnormal 5.7 x 7.1 cm hypermetabolic soft tissue in the perianal region is compatible with patient's history of squamous cell carcinoma. Bilateral inguinal adenopathy, largest on left is hypermetabolic and measures 1.8 x 2.9 cm, and largest on the right measures 1.2 cm without hypermetabolism; question secondary to infection or metastatic involvement 4) 04/2010: Evaluated by Radiation Oncologist Dr. [name] at [facility]. He was not deemed a candidate for radiation given concern for metastatic disease based on PET scan. 5) 06/22/10: Recommended to begin systemic therapy with cisplatin and continuous infusion 5-FU for metastatic disease. 6) 07/2010: Re-evaluated at [facility] by Dr. [name] and Dr. [name], as he did not have biopsy proven metastatic disease, and inguinal adenopathy may be reactive due to infection/inflammation/abscess. 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 9) 02/10/14: After lost to follow-up, he returned to [facility] for evaluation by Dr. [name] due to concern for recurrence of disease throughout the perineum and perianal areas. The most worrisome is a large ulcer on the left perianal area.  10) 02/18/14: Exam under anesthesia with biopsies performed. Left perianal region biopsy confirmed invasive squamous cell carcinoma, moderate-to-poorly differentiated. Patient refused at this time APR. 11) 05/01/14: PET CT: Perianal and left groin/penile malignancy with bilateral inguinal and left external iliac metastases. 12) 07/07/14 - 09/03/14: XRT. Patient declined recommended concurrent 5FU/[facility]. At [place]. Patient refused APR. 13) 12/19/16: PET CT: No suspicious metabolically active osseous lesions are identified - Interval increase in the extent of the hypermetabolic activity involving the region of the anus which extends up into the fascia adjacent the rectum and out into the bilateral buttocks associated with soft tissue erosion and skin thickening. 14) 12/19/16: MRI Pelvis: Motion artefact. Anal tumor with gross invasion of the anal sphincters and extension into the ischioanal fossa, with irregular appearance of the perineum/gluteal skin, also abutting the pelvic sidewall (left > right) and likely invading prostate apex. Overall, this is increased from prior CT 01/05/2015, concerning for disease progression. Exam revealed obliteration of the anus. Patient refused APR. 15) 03/2018 - consideration for pembrolizumab - patient declined. 16) 10/2018 - represents with substantial bleeding episodes from erosive, locally-advanced anal cancer  17) 11/12/2018: reviewed by [facility] surgery again. Admitted for consideration of ostomy from 10/4 to 11/21/2018. 18) 12/10/18-12/12/18: patient seen by  [place] (Dr. Dennison Nancy) and Surgical Oncology.  MRI pelvis, and CT chest performed. 19) 12/19/2018: establish care with Dr.  [name]  20)  12/21/2018: 4th opinion received at [facility], [place] with Dr. [name].  21) ***. Underwent a diverting colectomy with placement of a colostomy bag at [name]. Tissue removed for pathological review.   #High Grade Non-Hodgkins Lymphoma 1)1999: He was treated for high-grade Non-Hodgkin's Lymphoma by Dr. [name] in [place], [place]. He received a year of chemotherapy. No radiation.   Interval History:  Dylan [name] 48 y.o. male with medical history significant for metastatic anal cancer presents for a follow up visit. He was last seen in our clinic on 01/04/2019.  In the interim since his last visit he agreed to have surgical evaluation at [facility] with a diverting colostomy. A sample of his tumor tissue was also taken during the procedure. ***  MEDICAL HISTORY:  Past Medical History:  Diagnosis Date  . Cancer ([place]) 2018   anal cancer  . S/P radiation therapy 07/07/14-7/20/216   anal ca     SURGICAL HISTORY: No past surgical history on file.  SOCIAL HISTORY: Social History   Socioeconomic History  . Marital status: Single    Spouse name: Not on file  . Number of children: Not on file  . Years of education: Not on file  . Highest education level: Not on file  Occupational History  . Not on file  Tobacco Use  . Smoking status: Current Some Day Smoker    Packs/day: 0.30    Types: Cigarettes  . Smokeless tobacco: Current User  Substance and Sexual Activity  . Alcohol use: No  . Drug use: Not on file  . Sexual activity: Not on file  Other Topics Concern  . Not on file  Social History Narrative  . Not on file   Social Determinants of Health   Financial Resource Strain:   . Difficulty of Paying Living Expenses: Not on file  Food Insecurity:   . Worried About Charity fundraiser in the Last  Year: Not on file  . Ran Out of Food in the Last Year: Not on file  Transportation Needs:   . Lack of Transportation (Medical): Not on file  . Lack of Transportation (Non-Medical): Not on file  Physical Activity:   . Days of Exercise per Week: Not on file  . Minutes of Exercise per Session: Not on file  Stress:   . Feeling of Stress : Not on file  Social Connections:   . Frequency of Communication with Friends and Family: Not on file  . Frequency of Social Gatherings with Friends and Family: Not on file  . Attends Religious Services: Not on file  . Active Member of Clubs or Organizations: Not on file  . Attends Archivist Meetings: Not on file  . Marital Status: Not on file  Intimate Partner Violence:   . Fear of Current or Ex-Partner: Not on file  . Emotionally Abused: Not on file  . Physically Abused: Not on file  . Sexually Abused: Not on file    FAMILY HISTORY: Family History  Problem Relation Age of Onset  . Stroke Mother     ALLERGIES:  has No Known Allergies.  MEDICATIONS:  Current Outpatient Medications  Medication Sig Dispense Refill  . feeding supplement, ENSURE ENLIVE, (ENSURE ENLIVE) LIQD Take 237 mLs by mouth 2 (two) times daily between meals. 237 mL 0  . ferrous sulfate 325 (65 FE) MG tablet Take 1 tablet (325 mg total) by mouth daily with breakfast. 30 tablet 0  . MELATONIN PO  Take by mouth.    . oxyCODONE (OXY IR/ROXICODONE) 5 MG immediate release tablet Take 20 mg by mouth every 4 (four) hours as needed for severe pain. Takes 1 tablet in the morning, 1.5 tablets in the afternoon and 1 tablet @@ night    . polyethylene glycol (MIRALAX / GLYCOLAX) 17 g packet Take 17 g by mouth daily. As needed     No current facility-administered medications for this visit.    REVIEW OF SYSTEMS:   Constitutional: ( - ) fevers, ( - )  chills , ( - ) night sweats Eyes: ( - ) blurriness of vision, ( - ) double vision, ( - ) watery eyes Ears, nose, mouth, throat,  and face: ( - ) mucositis, ( - ) sore throat Respiratory: ( - ) cough, ( - ) dyspnea, ( - ) wheezes Cardiovascular: ( + ) palpitation, ( - ) chest discomfort, ( - ) lower extremity swelling Gastrointestinal:  ( - ) nausea, ( - ) heartburn, ( - ) change in bowel habits Skin: ( - ) abnormal skin rashes Lymphatics: ( - ) new lymphadenopathy, ( - ) easy bruising Neurological: ( - ) numbness, ( - ) tingling, ( - ) new weaknesses Behavioral/Psych: ( - ) mood change, ( - ) new changes  All other systems were reviewed with the patient and are negative.  PHYSICAL EXAMINATION: ECOG PERFORMANCE STATUS: 3 - Symptomatic, >50% confined to bed  There were no vitals filed for this visit. There were no vitals filed for this visit.  GENERAL: chronically ill appearing middle aged [place] American male. alert, no distress and comfortable SKIN: skin color, texture, turgor are normal, no rashes or significant lesions EYES: conjunctiva are pink and non-injected, sclera clear LUNGS: clear to auscultation and percussion with normal breathing effort HEART: regular rate & rhythm and no murmurs and no lower extremity edema ABDOMEN: soft, non-tender, non-distended, normal bowel sounds WOUND: large cavernous wound tracking from the glutteal cleft forward to the scrotum. No active oozing or purulence noted. Evidence of fecal incontinence. Malodorous.  Musculoskeletal: no cyanosis of digits and no clubbing  PSYCH: alert & oriented x 3, fluent speech NEURO: no focal motor/sensory deficits  LABORATORY DATA:  I have reviewed the data as listed Recent Results (from the past 2160 hour(s))  POCT CBC     Status: Abnormal   Collection Time: 11/07/18  2:50 PM  Result Value Ref Range   WBC 12.0 (A) 4.6 - 10.2 K/uL   Lymph, poc 17.1 (A) 0.6 - 3.4   POC LYMPH PERCENT 3.1 (A) 10 - 50 %L   MID (cbc) 79.8 (A) 0 - 0.9   POC MID % 2.1 0 - 12 %M   POC Granulocyte 0.4 (A) 2 - 6.9   Granulocyte percent 9.6 (A) 37 - 80 %G   RBC  3.89 (A) 4.69 - 6.13 M/uL   Hemoglobin 7.7 (A) 11 - 14.6 g/dL   HCT, POC 25.4 (A) 29 - 41 %   MCV 65.3 (A) 76 - 111 fL   MCH, POC 19.7 (A) 27 - 31.2 pg   MCHC 30.2 (A) 31.8 - 35.4 g/dL   RDW, POC 19.0 %   Platelet Count, POC 588 (A) 142 - 424 K/uL   MPV 6.0 0 - 99.8 fL  CEA (IN HOUSE-[facility])     Status: None   Collection Time: 12/19/18  2:42 PM  Result Value Ref Range   CEA ([facility]-In House) 4.35 0.00 - 5.00 ng/mL  Comment: (NOTE) This test was performed using Architect's Chemiluminescent Microparticle Immunoassay. Values obtained from different assay methods cannot be used interchangeably. Please note that 5-10% of patients who smoke may see CEA levels up to 6.9 ng/mL. Performed at [facility], [place] [address], [place], [place] [address]   Ferritin     Status: None   Collection Time: 12/19/18  2:42 PM  Result Value Ref Range   Ferritin 92 24 - 336 ng/mL    Comment: Performed at [facility], [place] [address] [place], [place] [address]  Iron and TIBC     Status: Abnormal   Collection Time: 12/19/18  2:42 PM  Result Value Ref Range   Iron <5 (L) 42 - 163 ug/dL   TIBC 176 (L) 202 - 409 ug/dL   Saturation Ratios UNABLE TO CALCULATE 20 - 55 %   UIBC UNABLE TO CALCULATE 117 - 376 ug/dL    Comment: Performed at [facility], [place] [address], [place], [place] [address]  Magnesium     Status: None   Collection Time: 12/19/18  2:42 PM  Result Value Ref Range   Magnesium 1.8 1.7 - 2.4 mg/dL    Comment: Performed at [facility], [place] [address] [place], [place] [address]  Lactate dehydrogenase (LDH)     Status: Abnormal   Collection Time: 12/19/18  2:42 PM  Result Value Ref Range   LDH 71 (L) 98 - 192 U/L    Comment: Performed at [facility], [place] [address], [place], [place] [address]  CMP ([place] only)     Status: Abnormal   Collection Time: 12/19/18   2:42 PM  Result Value Ref Range   Sodium 135 135 - 145 mmol/L   Potassium 4.1 3.5 - 5.1 mmol/L   Chloride 99 98 - 111 mmol/L   CO2 26 22 - 32 mmol/L   Glucose, Bld 84 70 - 99 mg/dL   BUN 10 6 - 20 mg/dL   Creatinine 0.89 0.61 - 1.24 mg/dL   Calcium 9.3 8.9 - 10.3 mg/dL   Total Protein 8.4 (H) 6.5 - 8.1 g/dL   Albumin 2.2 (L) 3.5 - 5.0 g/dL   AST 11 (L) 15 - 41 U/L   ALT 13 0 - 44 U/L   Alkaline Phosphatase 99 38 - 126 U/L   Total Bilirubin <0.2 (L) 0.3 - 1.2 mg/dL   GFR, Est Non Af Am >60 >60 mL/min   GFR, Est AFR Am >60 >60 mL/min   Anion gap 10 5 - 15    Comment: Performed at [facility], [address] [place], [place] [address]  CBC with Differential ([place] Only)     Status: Abnormal   Collection Time: 12/19/18  2:42 PM  Result Value Ref Range   WBC Count 11.5 (H) 4.0 - 10.5 K/uL   RBC 3.41 (L) 4.22 - 5.81 MIL/uL   Hemoglobin 7.0 (L) 13.0 - 17.0 g/dL    Comment: Reticulocyte Hemoglobin testing may be clinically indicated, consider ordering this additional test [id number]    HCT 23.2 (L) 39.0 - 52.0 %   MCV 68.0 (L) 80.0 - 100.0 fL   MCH 20.5 (L) 26.0 - 34.0 pg   MCHC 30.2 30.0 - 36.0 g/dL   RDW 20.3 (H) 11.5 - 15.5 %   Platelet Count 585 (H) 150 - 400 K/uL   nRBC 0.0 0.0 - 0.2 %  Neutrophils Relative % 83 %   Neutro Abs 9.4 (H) 1.7 - 7.7 K/uL   Lymphocytes Relative 10 %   Lymphs Abs 1.2 0.7 - 4.0 K/uL   Monocytes Relative 7 %   Monocytes Absolute 0.8 0.1 - 1.0 K/uL   Eosinophils Relative 0 %   Eosinophils Absolute 0.1 0.0 - 0.5 K/uL   Basophils Relative 0 %   Basophils Absolute 0.0 0.0 - 0.1 K/uL   Immature Granulocytes 0 %   Abs Immature Granulocytes 0.04 0.00 - 0.07 K/uL    Comment: Performed at [facility], [place] [address] [place], [place] [address]  Sample to Blood Bank     Status: None   Collection Time: 01/04/19 10:53 AM  Result Value Ref Range   Blood Bank Specimen SAMPLE AVAILABLE FOR TESTING     Sample Expiration      01/07/2019,2359 Performed at [facility], [place] [address] [place], [place] [address]   CMP ([place] only)     Status: Abnormal   Collection Time: 01/04/19 10:53 AM  Result Value Ref Range   Sodium 135 135 - 145 mmol/L   Potassium 3.7 3.5 - 5.1 mmol/L   Chloride 100 98 - 111 mmol/L   CO2 24 22 - 32 mmol/L   Glucose, Bld 177 (H) 70 - 99 mg/dL   BUN 10 6 - 20 mg/dL   Creatinine 1.00 0.61 - 1.24 mg/dL   Calcium 9.1 8.9 - 10.3 mg/dL   Total Protein 8.4 (H) 6.5 - 8.1 g/dL   Albumin 2.2 (L) 3.5 - 5.0 g/dL   AST 9 (L) 15 - 41 U/L   ALT 10 0 - 44 U/L   Alkaline Phosphatase 90 38 - 126 U/L   Total Bilirubin <0.2 (L) 0.3 - 1.2 mg/dL   GFR, Est Non Af Am >60 >60 mL/min   GFR, Est AFR Am >60 >60 mL/min   Anion gap 11 5 - 15    Comment: Performed at [facility], [place] [address], [place], [place] [address]  CBC with Differential ([place] Only)     Status: Abnormal   Collection Time: 01/04/19 10:53 AM  Result Value Ref Range   WBC Count 11.2 (H) 4.0 - 10.5 K/uL   RBC 3.37 (L) 4.22 - 5.81 MIL/uL   Hemoglobin 6.8 (LL) 13.0 - 17.0 g/dL    Comment: Reticulocyte Hemoglobin testing may be clinically indicated, consider ordering this additional test [id number] THIS CRITICAL RESULT HAS VERIFIED AND BEEN CALLED TO Narda Rutherford, MD BY JENNIFER MEARNS ON 11 20 2020 AT 1111, AND HAS BEEN READ BACK.     HCT 23.1 (L) 39.0 - 52.0 %   MCV 68.5 (L) 80.0 - 100.0 fL   MCH 20.2 (L) 26.0 - 34.0 pg   MCHC 29.4 (L) 30.0 - 36.0 g/dL   RDW 19.4 (H) 11.5 - 15.5 %   Platelet Count 673 (H) 150 - 400 K/uL   nRBC 0.0 0.0 - 0.2 %   Neutrophils Relative % 86 %   Neutro Abs 9.7 (H) 1.7 - 7.7 K/uL   Lymphocytes Relative 10 %   Lymphs Abs 1.2 0.7 - 4.0 K/uL   Monocytes Relative 2 %   Monocytes Absolute 0.3 0.1 - 1.0 K/uL   Eosinophils Relative 1 %   Eosinophils Absolute 0.1 0.0 - 0.5 K/uL   Basophils Relative 0 %   Basophils Absolute  0.0 0.0 - 0.1 K/uL   Immature Granulocytes 1 %  Abs Immature Granulocytes 0.06 0.00 - 0.07 K/uL    Comment: Performed at [facility], [place] [address] [place], [place] [address]  Type and screen     Status: None   Collection Time: 01/04/19 10:53 AM  Result Value Ref Range   ABO/RH(D) O POS    Antibody Screen NEG    Sample Expiration 01/07/2019,2359    Unit Number [id number]    Blood Component Type RED CELLS,LR    Unit division 00    Status of Unit ISSUED,FINAL    Transfusion Status OK TO TRANSFUSE    Crossmatch Result Compatible    Unit Number [id number]    Blood Component Type RED CELLS,LR    Unit division 00    Status of Unit ISSUED,FINAL    Transfusion Status OK TO TRANSFUSE    Crossmatch Result      Compatible Performed at [facility], [place] [address], [place], [place] [address]   Prepare RBC     Status: None   Collection Time: 01/04/19 10:53 AM  Result Value Ref Range   Order Confirmation      ORDER PROCESSED BY BLOOD BANK Performed at [facility], [place] [address], [place], [place] [address]   ABO/Rh     Status: None   Collection Time: 01/04/19 10:53 AM  Result Value Ref Range   ABO/RH(D)      O POS Performed at [facility], [place] [address] [place], [place] [address]   BPAM RBC     Status: None   Collection Time: 01/04/19 10:53 AM  Result Value Ref Range   ISSUE DATE / TIME [id number]    Blood Product Unit Number [id number]    PRODUCT CODE E9381O17    Unit Type and Rh 5100    Blood Product Expiration Date 202012212359    ISSUE DATE / TIME [id number]    Blood Product Unit Number [id number]    PRODUCT CODE [id number]    Unit Type and Rh 5100    Blood Product Expiration Date 509326712458     RADIOGRAPHIC STUDIES: None to review  ASSESSMENT & PLAN Dylan [name] 48 y.o. male with medical history significant for non-Hodgkin lymphoma (treated in 1999 with chemo  alone) who presents for evaluation of metastatic anal cancer. His course has been markedly complicated and handled at a multitude of different institutions.  ***  #Metastatic Anal Cancer --will require new PET CT as restaging. Plan for this to occur in January once the acute inflammation from his surgery decreases. CT Chest and MRI pelvis are available from [facility].  -- will optimize patient for chemotherapy treatment by replenishing his iron stores and increasing his blood counts.  --RTC in 4 weeks to allow Mr. Mogg to recover from his surgery re-evaluate for cancer treatment. In the interim we should get the microsatellite stability and PD-L1 status from the sample of tumor collected during his procedure at [facility].   #Iron Deficiency Anemia Secondary to Blood Loss --***  --repeat iron panel today, will arrange for patient to receive IV iron starting next week *** --prior Iron panel shows undetectable iron and iron sats. His ferritin is elevated at 92, likely falsely elevated in the setting of inflammation from the wound.  --patient has stopped taking his PO iron pills due to constipation.  --patient will require transfusion if Hgb drops below 7.0. Continue to monitor at each subsequent visit.  --continue to monitor  All questions were answered. The patient knows  to call the clinic with any problems, questions or concerns.  A total of more than 40 minutes were spent face-to-face with the patient during this encounter and over half of that time was spent on counseling and coordination of care as outlined above.   Ledell Peoples, MD Department of Hematology/Oncology [place] at [facility] Phone: [phone] Pager: [phone] Email: [email or web address]  01/31/2019 5:56 PM

## 2019-02-01 ENCOUNTER — Telehealth: Payer: Self-pay | Admitting: *Deleted

## 2019-02-01 ENCOUNTER — Inpatient Hospital Stay: Payer: Medicare Other | Admitting: Hematology and Oncology

## 2019-02-01 ENCOUNTER — Telehealth: Payer: Self-pay | Admitting: Hematology and Oncology

## 2019-02-01 ENCOUNTER — Inpatient Hospital Stay: Payer: Medicare Other

## 2019-02-01 NOTE — Telephone Encounter (Signed)
Returned patient's phone call regarding rescheduling 12/18 appointments, per patient's request appointment has moved to 12/21.

## 2019-02-01 NOTE — Telephone Encounter (Signed)
Received call from pt's 'care advocate'.  She states she comes to his home daily to help with his bathing etc.  Today, she states, he can barely stand, can only walk with max assist for a few steps and then cannot go any further.  He is c/o increased back pain and abd pain. Pt is a couple weeks post-diversion surgery for bowel and bladder.  Pt was to come to cancer center today for a general f/u.  Charlene states she cannot get him here and that he needs more help at home.  His daughter is not there with him right now but her young children are. Advised to get hold of pt's daughter, have her return home and then pt needs to go to ED for evaluation. As we have not seen him since his surgery, he needs to be evaluated in the ED Charlene and pt voiced understanding

## 2019-02-03 NOTE — Progress Notes (Signed)
[facility] [phone]   [phone]  PROGRESS NOTE  Patient Care Team: [name], MD as PCP - General (Family Medicine) [name], NP as Nurse Practitioner (Hospice and Palliative Medicine)  Hematological/Oncological History # Metastatic Anal Cancer (summarized history obtained from [facility] Surgical Notes) 1) 03/09/10: CT scan performed showed superficial inflammation along the medial crease of the left buttocks within the subcutaneous fat measuring 2.5 x 5 cm including no areas of deep penetration with a few associated bubbles of air and gas present.  2) 03/10/10: A 2 x 2 cm area of condylomata was present in the perianal area that was excised by Dr. [name], along with multiple I&Ds of left gluteal abscesses. 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 No lymph nodes biopsied. 3) 03/29/10: PET scan showed abnormal 5.7 x 7.1 cm hypermetabolic soft tissue in the perianal region is compatible with patient's history of squamous cell carcinoma. Bilateral inguinal adenopathy, largest on left is hypermetabolic and measures 1.8 x 2.9 cm, and largest on the right measures 1.2 cm without hypermetabolism; question secondary to infection or metastatic involvement 4) 04/2010: Evaluated by Radiation Oncologist Dr. [name] at [facility]. He was not deemed a candidate for radiation given concern for metastatic disease based on PET scan. 5) 06/22/10: Recommended to begin systemic therapy with cisplatin and continuous infusion 5-FU for metastatic disease. 6) 07/2010: Re-evaluated at [facility] by Dr. [name] and Dr. [name], as he did not have biopsy proven metastatic disease, and inguinal adenopathy may be reactive due to infection/inflammation/abscess. 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 9) 02/10/14: After lost to follow-up, he returned to [facility] for evaluation by Dr. [name] due to concern for recurrence of disease throughout the perineum and perianal areas. The most worrisome is a large ulcer on the left perianal area.  10) 02/18/14: Exam under anesthesia with biopsies performed. Left perianal region biopsy confirmed invasive squamous cell carcinoma, moderate-to-poorly differentiated. Patient refused at this time APR. 11) 05/01/14: PET CT: Perianal and left groin/penile malignancy with bilateral inguinal and left external iliac metastases. 12) 07/07/14 - 09/03/14: XRT. Patient declined recommended concurrent 5FU/[facility]. At [place]. Patient refused APR. 13) 12/19/16: PET CT: No suspicious metabolically active osseous lesions are identified - Interval increase in the extent of the hypermetabolic activity involving the region of the anus which extends up into the fascia adjacent the rectum and out into the bilateral buttocks associated with soft tissue erosion and skin thickening. 14) 12/19/16: MRI Pelvis: Motion artefact. Anal tumor with gross invasion of the anal sphincters and extension into the ischioanal fossa, with irregular appearance of the perineum/gluteal skin, also abutting the pelvic sidewall (left > right) and likely invading prostate apex. Overall, this is increased from prior CT 01/05/2015, concerning for disease progression. Exam revealed obliteration of the anus. Patient refused APR. 15) 03/2018 - consideration for pembrolizumab - patient declined. 16) 10/2018 - represents with substantial bleeding episodes from erosive, locally-advanced anal cancer  17) 11/12/2018: reviewed by [facility] surgery again. Admitted for consideration of ostomy from 10/4 to 11/21/2018. 18) 12/10/18-12/12/18: patient seen by  [place] (Dr. Dennison Nancy) and Surgical Oncology.  MRI pelvis, and CT chest performed. 19) 12/19/2018: establish care with Dr.  Lorae Roig  20)  12/21/2018: 4th opinion received at [facility], [place] with Dr. [name].  21) 01/17/2019: Underwent a diverting colostomy with placement of a colostomy bag at [facility]. Tumor tissue excised for pathological review.   #High Grade Non-Hodgkins Lymphoma 1)1999: He was treated for high-grade Non-Hodgkin's Lymphoma by Dr. [name] in [place], [place]. He received a year of chemotherapy. No radiation.   Interval History:  Dylan [name] 48 y.o. male with medical history significant for metastatic anal cancer presents for a follow up visit. He was last seen in our clinic on 01/04/2019.  In the interim since his last visit he agreed to have surgical evaluation at [facility] with a diverting colostomy, which was performed on 01/17/2019. He was admitted from 12/1-12/06/2018. A sample of his tumor tissue was also taken during the procedure.   On exam today Dylan [name] has had a marked decline in his functional status.  He reports that he has been in bed since at least Friday and that his pain has been poorly controlled.  He reports it is currently 9 out of 10 in severity.  He denies any fevers, chills, sweats, nausea, or vomiting.  Reports that he is having blood in stool discharge from his backside wound, even after placement of the ostomy.  He does report having a good appetite and that he is staying up with hydration, but he is markedly pale.  He is unable to stand or walk on his own and needs a two-person assist to transfer from recliner to chair.  He continues to produce urine from his backside as well.  After review of his vitals which showed tachycardia up to 139 and hypotension of 84/66 it was decided to transfer him to the emergency department for further evaluation and management.  At this time is most likely this is a combination of  dehydration, anemia, and possible wound infection.  MEDICAL HISTORY:  Past Medical History:  Diagnosis Date  . Cancer ([place]) 2018   anal cancer  . S/P radiation therapy 07/07/14-7/20/216   anal ca     SURGICAL HISTORY: No past surgical history on file.  SOCIAL HISTORY: Social History   Socioeconomic History  . Marital status: Single    Spouse name: Not on file  . Number of children: Not on file  . Years of education: Not on file  . Highest education level: Not on file  Occupational History  . Not on file  Tobacco Use  . Smoking status: Current Some Day Smoker    Packs/day: 0.30    Types: Cigarettes  . Smokeless tobacco: Current User  Substance and Sexual Activity  . Alcohol use: No  . Drug use: Not on file  . Sexual activity: Not on file  Other Topics Concern  . Not on file  Social History Narrative  . Not on file   Social Determinants of Health   Financial Resource Strain:   . Difficulty of Paying Living Expenses: Not on file  Food Insecurity:   . Worried About Charity fundraiser in the Last Year: Not on file  . Ran Out of Food in the Last Year: Not on file  Transportation Needs:   . Lack of Transportation (Medical): Not on file  . Lack of Transportation (Non-Medical): Not on file  Physical Activity:   . Days of Exercise per Week: Not on file  . Minutes of Exercise per Session: Not on file  Stress:   . Feeling of Stress :  Not on file  Social Connections:   . Frequency of Communication with Friends and Family: Not on file  . Frequency of Social Gatherings with Friends and Family: Not on file  . Attends Religious Services: Not on file  . Active Member of Clubs or Organizations: Not on file  . Attends Archivist Meetings: Not on file  . Marital Status: Not on file  Intimate Partner Violence:   . Fear of Current or Ex-Partner: Not on file  . Emotionally Abused: Not on file  . Physically Abused: Not on file  . Sexually Abused: Not on file     FAMILY HISTORY: Family History  Problem Relation Age of Onset  . Stroke Mother     ALLERGIES:  has No Known Allergies.  MEDICATIONS:  Current Outpatient Medications  Medication Sig Dispense Refill  . feeding supplement, ENSURE ENLIVE, (ENSURE ENLIVE) LIQD Take 237 mLs by mouth 2 (two) times daily between meals. 237 mL 0  . ferrous sulfate 325 (65 FE) MG tablet Take 1 tablet (325 mg total) by mouth daily with breakfast. 30 tablet 0  . MELATONIN PO Take by mouth.    . oxyCODONE (OXY IR/ROXICODONE) 5 MG immediate release tablet Take 20 mg by mouth every 4 (four) hours as needed for severe pain. Takes 1 tablet in the morning, 1.5 tablets in the afternoon and 1 tablet @@ night    . polyethylene glycol (MIRALAX / GLYCOLAX) 17 g packet Take 17 g by mouth daily. As needed     No current facility-administered medications for this visit.    REVIEW OF SYSTEMS:   Constitutional: ( - ) fevers, ( - )  chills , ( - ) night sweats Eyes: ( - ) blurriness of vision, ( - ) double vision, ( - ) watery eyes Ears, nose, mouth, throat, and face: ( - ) mucositis, ( - ) sore throat Respiratory: ( - ) cough, ( - ) dyspnea, ( - ) wheezes Cardiovascular: ( + ) palpitation, ( - ) chest discomfort, ( - ) lower extremity swelling Gastrointestinal:  ( - ) nausea, ( - ) heartburn, ( - ) change in bowel habits Skin: ( - ) abnormal skin rashes Lymphatics: ( - ) new lymphadenopathy, ( - ) easy bruising Neurological: ( - ) numbness, ( - ) tingling, ( - ) new weaknesses Behavioral/Psych: ( - ) mood change, ( - ) new changes  All other systems were reviewed with the patient and are negative.  PHYSICAL EXAMINATION: ECOG PERFORMANCE STATUS: 4 - Bedbound  Vitals:   02/04/19 1511  BP: 92/65  Pulse: (!) 139  Resp: 16  Temp: 98.3 F (36.8 C)  SpO2: 100%   GENERAL: acute on chronically ill appearing middle aged [place] American male. alert, no distress and comfortable SKIN: skin color, texture, turgor are normal,  no rashes or significant lesions EYES: conjunctiva are pink and non-injected, sclera clear LUNGS: clear to auscultation and percussion with normal breathing effort HEART: tachycardic with no murmurs and no lower extremity edema ABDOMEN: soft, non-tender, non-distended, normal bowel sounds WOUND: unable to view today in clinic due to pain and inability to sit upright. Malodorous.  Musculoskeletal: no cyanosis of digits and no clubbing  PSYCH: alert & oriented x 3, fluent speech NEURO: no focal motor/sensory deficits  LABORATORY DATA:  I have reviewed the data as listed Recent Results (from the past 2160 hour(s))  POCT CBC     Status: Abnormal   Collection Time: 11/07/18  2:50 PM  Result Value Ref Range   WBC 12.0 (A) 4.6 - 10.2 K/uL   Lymph, poc 17.1 (A) 0.6 - 3.4   POC LYMPH PERCENT 3.1 (A) 10 - 50 %L   MID (cbc) 79.8 (A) 0 - 0.9   POC MID % 2.1 0 - 12 %M   POC Granulocyte 0.4 (A) 2 - 6.9   Granulocyte percent 9.6 (A) 37 - 80 %G   RBC 3.89 (A) 4.69 - 6.13 M/uL   Hemoglobin 7.7 (A) 11 - 14.6 g/dL   HCT, POC 25.4 (A) 29 - 41 %   MCV 65.3 (A) 76 - 111 fL   MCH, POC 19.7 (A) 27 - 31.2 pg   MCHC 30.2 (A) 31.8 - 35.4 g/dL   RDW, POC 19.0 %   Platelet Count, POC 588 (A) 142 - 424 K/uL   MPV 6.0 0 - 99.8 fL  CEA (IN HOUSE-[facility])     Status: None   Collection Time: 12/19/18  2:42 PM  Result Value Ref Range   CEA ([facility]-In House) 4.35 0.00 - 5.00 ng/mL    Comment: (NOTE) This test was performed using Architect's Chemiluminescent Microparticle Immunoassay. Values obtained from different assay methods cannot be used interchangeably. Please note that 5-10% of patients who smoke may see CEA levels up to 6.9 ng/mL. Performed at [facility], [place] [address] [place], [place] [address]   Ferritin     Status: None   Collection Time: 12/19/18  2:42 PM  Result Value Ref Range   Ferritin 92 24 - 336 ng/mL    Comment: Performed at [facility],  [place] [address], [place], [place] [address]  Iron and TIBC     Status: Abnormal   Collection Time: 12/19/18  2:42 PM  Result Value Ref Range   Iron <5 (L) 42 - 163 ug/dL   TIBC 176 (L) 202 - 409 ug/dL   Saturation Ratios UNABLE TO CALCULATE 20 - 55 %   UIBC UNABLE TO CALCULATE 117 - 376 ug/dL    Comment: Performed at [facility], [place] [address], [place], [place] [address]  Magnesium     Status: None   Collection Time: 12/19/18  2:42 PM  Result Value Ref Range   Magnesium 1.8 1.7 - 2.4 mg/dL    Comment: Performed at [facility], [place] [address] [place], [place] [address]  Lactate dehydrogenase (LDH)     Status: Abnormal   Collection Time: 12/19/18  2:42 PM  Result Value Ref Range   LDH 71 (L) 98 - 192 U/L    Comment: Performed at [facility], [place] [address] [place], [place] [address]  CMP ([place] only)     Status: Abnormal   Collection Time: 12/19/18  2:42 PM  Result Value Ref Range   Sodium 135 135 - 145 mmol/L   Potassium 4.1 3.5 - 5.1 mmol/L   Chloride 99 98 - 111 mmol/L   CO2 26 22 - 32 mmol/L   Glucose, Bld 84 70 - 99 mg/dL   BUN 10 6 - 20 mg/dL   Creatinine 0.89 0.61 - 1.24 mg/dL   Calcium 9.3 8.9 - 10.3 mg/dL   Total Protein 8.4 (H) 6.5 - 8.1 g/dL   Albumin 2.2 (L) 3.5 - 5.0 g/dL   AST 11 (L) 15 - 41 U/L   ALT 13 0 - 44 U/L   Alkaline Phosphatase 99 38 - 126 U/L  Total Bilirubin <0.2 (L) 0.3 - 1.2 mg/dL   GFR, Est Non Af Am >60 >60 mL/min   GFR, Est AFR Am >60 >60 mL/min   Anion gap 10 5 - 15    Comment: Performed at [facility], [place] [address], [place], [place] [address]  CBC with Differential ([place] Only)     Status: Abnormal   Collection Time: 12/19/18  2:42 PM  Result Value Ref Range   WBC Count 11.5 (H) 4.0 - 10.5 K/uL   RBC 3.41 (L) 4.22 - 5.81 MIL/uL   Hemoglobin 7.0 (L) 13.0 - 17.0 g/dL    Comment: Reticulocyte Hemoglobin testing may be  clinically indicated, consider ordering this additional test [id number]    HCT 23.2 (L) 39.0 - 52.0 %   MCV 68.0 (L) 80.0 - 100.0 fL   MCH 20.5 (L) 26.0 - 34.0 pg   MCHC 30.2 30.0 - 36.0 g/dL   RDW 20.3 (H) 11.5 - 15.5 %   Platelet Count 585 (H) 150 - 400 K/uL   nRBC 0.0 0.0 - 0.2 %   Neutrophils Relative % 83 %   Neutro Abs 9.4 (H) 1.7 - 7.7 K/uL   Lymphocytes Relative 10 %   Lymphs Abs 1.2 0.7 - 4.0 K/uL   Monocytes Relative 7 %   Monocytes Absolute 0.8 0.1 - 1.0 K/uL   Eosinophils Relative 0 %   Eosinophils Absolute 0.1 0.0 - 0.5 K/uL   Basophils Relative 0 %   Basophils Absolute 0.0 0.0 - 0.1 K/uL   Immature Granulocytes 0 %   Abs Immature Granulocytes 0.04 0.00 - 0.07 K/uL    Comment: Performed at [facility], [address], [place], [place] [address]  Sample to Blood Bank     Status: None   Collection Time: 01/04/19 10:53 AM  Result Value Ref Range   Blood Bank Specimen SAMPLE AVAILABLE FOR TESTING    Sample Expiration      01/07/2019,2359 Performed at [facility], [place] [address] [place], [place] [address]   CMP ([place] only)     Status: Abnormal   Collection Time: 01/04/19 10:53 AM  Result Value Ref Range   Sodium 135 135 - 145 mmol/L   Potassium 3.7 3.5 - 5.1 mmol/L   Chloride 100 98 - 111 mmol/L   CO2 24 22 - 32 mmol/L   Glucose, Bld 177 (H) 70 - 99 mg/dL   BUN 10 6 - 20 mg/dL   Creatinine 1.00 0.61 - 1.24 mg/dL   Calcium 9.1 8.9 - 10.3 mg/dL   Total Protein 8.4 (H) 6.5 - 8.1 g/dL   Albumin 2.2 (L) 3.5 - 5.0 g/dL   AST 9 (L) 15 - 41 U/L   ALT 10 0 - 44 U/L   Alkaline Phosphatase 90 38 - 126 U/L   Total Bilirubin <0.2 (L) 0.3 - 1.2 mg/dL   GFR, Est Non Af Am >60 >60 mL/min   GFR, Est AFR Am >60 >60 mL/min   Anion gap 11 5 - 15    Comment: Performed at [facility], [place] [address], [place], [place] [address]  CBC with Differential ([place] Only)     Status: Abnormal    Collection Time: 01/04/19 10:53 AM  Result Value Ref Range   WBC Count 11.2 (H) 4.0 - 10.5 K/uL   RBC 3.37 (L) 4.22 - 5.81 MIL/uL   Hemoglobin 6.8 (LL) 13.0 - 17.0 g/dL  Comment: Reticulocyte Hemoglobin testing may be clinically indicated, consider ordering this additional test [id number] THIS CRITICAL RESULT HAS VERIFIED AND BEEN CALLED TO Narda Rutherford, MD BY JENNIFER MEARNS ON 11 20 2020 AT 31, AND HAS BEEN READ BACK.     HCT 23.1 (L) 39.0 - 52.0 %   MCV 68.5 (L) 80.0 - 100.0 fL   MCH 20.2 (L) 26.0 - 34.0 pg   MCHC 29.4 (L) 30.0 - 36.0 g/dL   RDW 19.4 (H) 11.5 - 15.5 %   Platelet Count 673 (H) 150 - 400 K/uL   nRBC 0.0 0.0 - 0.2 %   Neutrophils Relative % 86 %   Neutro Abs 9.7 (H) 1.7 - 7.7 K/uL   Lymphocytes Relative 10 %   Lymphs Abs 1.2 0.7 - 4.0 K/uL   Monocytes Relative 2 %   Monocytes Absolute 0.3 0.1 - 1.0 K/uL   Eosinophils Relative 1 %   Eosinophils Absolute 0.1 0.0 - 0.5 K/uL   Basophils Relative 0 %   Basophils Absolute 0.0 0.0 - 0.1 K/uL   Immature Granulocytes 1 %   Abs Immature Granulocytes 0.06 0.00 - 0.07 K/uL    Comment: Performed at [facility], [place] [address], [place], [place] [address]  Type and screen     Status: None   Collection Time: 01/04/19 10:53 AM  Result Value Ref Range   ABO/RH(D) O POS    Antibody Screen NEG    Sample Expiration 01/07/2019,2359    Unit Number [id number]    Blood Component Type RED CELLS,LR    Unit division 00    Status of Unit ISSUED,FINAL    Transfusion Status OK TO TRANSFUSE    Crossmatch Result Compatible    Unit Number [id number]    Blood Component Type RED CELLS,LR    Unit division 00    Status of Unit ISSUED,FINAL    Transfusion Status OK TO TRANSFUSE    Crossmatch Result      Compatible Performed at [facility], [place] [address] [place], [place] [address]   Prepare RBC     Status: None   Collection Time: 01/04/19 10:53 AM  Result Value Ref Range   Order  Confirmation      ORDER PROCESSED BY BLOOD BANK Performed at [facility], [place] [address] [place], [place] [address]   ABO/Rh     Status: None   Collection Time: 01/04/19 10:53 AM  Result Value Ref Range   ABO/RH(D)      O POS Performed at [facility], [place] [address] [place], [place] [address]   BPAM RBC     Status: None   Collection Time: 01/04/19 10:53 AM  Result Value Ref Range   ISSUE DATE / TIME 010932355732    Blood Product Unit Number [id number]    PRODUCT CODE [id number]    Unit Type and Rh 5100    Blood Product Expiration Date 202012212359    ISSUE DATE / TIME 160737106269    Blood Product Unit Number [id number]    PRODUCT CODE [id number]    Unit Type and Rh 5100    Blood Product Expiration Date 716967893810     RADIOGRAPHIC STUDIES: None to review  ASSESSMENT & PLAN Conway L Gunning 48 y.o. male with medical history significant for non-Hodgkin lymphoma (treated in 1999 with chemo alone) who presents for evaluation of metastatic anal cancer. His course has been markedly complicated and handled at a multitude of different institutions.  The latest development with Mr. Wilkie is that he underwent a diverting colostomy on 01/17/2019 at [facility].  He was admitted from 01/15/2019 until 01/19/2019.  He reports that he received 1 unit of packed red blood cells during that admission.  He was discharged with a functioning ostomy and suprapubic catheter.  On exam today Mr. Quant is markedly pale and weak.  H he held his head in his hands and requested to lie down immediately.  He has a 9 out of 10 pain and reports that he is having stool and bloody discharge from his backside wound.  After review of his labs which show tachycardia and hypotension we decided would be best to admit him for his likely dehydration, anemia, and pronounced decline in functional status. He was transferred to the ED at the end of our visit with a warm  handoff to the ED staff.   #Tachycardia #Hypotension #Marked Weakness --patient cancelled his visit on Friday as he was unable to get out of bed. He declined our request to report to the ED at that time --presented today requiring 2 person assist to transfer from chair to recliner.  --reports bleeding and stool production from his wound site, though he is producing stool in the ostomy bag.   Transfer to ED. Strongly Recommend admission for: 1) IV hydration and blood products for Hgb <7.0 2) PT evaluation and wound/ostomy service to view his large backside open wound and help with ostomy management  3) consider surgical evaluation for bleeding and production of stool from wound despite ostomy placement.  4) Given tachycardia and hypotension consider infectious workup for possible wound infection.   Oncology will continue to follow him in the inpatient setting.   #Metastatic Anal Cancer --will require new PET CT as restaging. Plan for this to occur in January once the acute inflammation from his surgery decreases. CT Chest and MRI pelvis are available from [facility].  -- will optimize patient for chemotherapy treatment by replenishing his iron stores and increasing his blood counts.  --RTC in 4 weeks to allow Mr. Eichenberger to recover from his surgery re-evaluate for cancer treatment. In the interim we should get the microsatellite stability and PD-L1 status from the sample of tumor collected during his procedure at [facility].   #Iron Deficiency Anemia Secondary to Blood Loss --patient received 1 unit of PRBC at [facility] (per patient report). He notes he is currently bleeding from his backside today. Due to pain and discomfort, we were not able to view his wound directly in clinic today.  --repeat iron panel, will arrange for patient to receive IV iron in the outpatient setting. --prior iron panel shows undetectable iron and iron sats. His ferritin is elevated at 92, likely falsely elevated in the setting of  inflammation from the wound.  --patient has stopped taking his PO iron pills due to constipation.  --patient will require transfusion if Hgb drops below 7.0. Continue to monitor at each subsequent visit.   All questions were answered. The patient knows to call the clinic with any problems, questions or concerns.  A total of more than 25 minutes were spent face-to-face with the patient during this encounter and over half of that time was spent on counseling and coordination of care as outlined above.   Ledell Peoples, MD Department of Hematology/Oncology [place] at [facility] Phone: [phone] Pager: [phone] Email: [email or web address]  02/04/2019 3:43 PM

## 2019-02-04 ENCOUNTER — Inpatient Hospital Stay: Payer: Medicare Other

## 2019-02-04 ENCOUNTER — Emergency Department (HOSPITAL_COMMUNITY): Payer: Medicare Other

## 2019-02-04 ENCOUNTER — Other Ambulatory Visit: Payer: Self-pay

## 2019-02-04 ENCOUNTER — Inpatient Hospital Stay (HOSPITAL_COMMUNITY)
Admission: EM | Admit: 2019-02-04 | Discharge: 2019-02-09 | DRG: 862 | Disposition: A | Discharge status: Home / Self Care | Payer: Medicare Other | Attending: Internal Medicine | Admitting: Internal Medicine

## 2019-02-04 ENCOUNTER — Other Ambulatory Visit: Payer: Self-pay | Admitting: *Deleted

## 2019-02-04 ENCOUNTER — Inpatient Hospital Stay: Payer: Medicare Other | Attending: Hematology and Oncology | Admitting: Hematology and Oncology

## 2019-02-04 ENCOUNTER — Encounter (HOSPITAL_COMMUNITY): Payer: Self-pay | Admitting: Emergency Medicine

## 2019-02-04 VITALS — BP 92/65 | HR 139 | Temp 98.3°F | Resp 16 | Ht 73.0 in

## 2019-02-04 DIAGNOSIS — N3000 Acute cystitis without hematuria: Secondary | ICD-10-CM | POA: Diagnosis present

## 2019-02-04 DIAGNOSIS — R000 Tachycardia, unspecified: Secondary | ICD-10-CM | POA: Diagnosis not present

## 2019-02-04 DIAGNOSIS — Z7901 Long term (current) use of anticoagulants: Secondary | ICD-10-CM

## 2019-02-04 DIAGNOSIS — R6521 Severe sepsis with septic shock: Secondary | ICD-10-CM | POA: Diagnosis not present

## 2019-02-04 DIAGNOSIS — I472 Ventricular tachycardia: Secondary | ICD-10-CM | POA: Diagnosis not present

## 2019-02-04 DIAGNOSIS — E860 Dehydration: Secondary | ICD-10-CM | POA: Diagnosis present

## 2019-02-04 DIAGNOSIS — C7800 Secondary malignant neoplasm of unspecified lung: Secondary | ICD-10-CM | POA: Diagnosis present

## 2019-02-04 DIAGNOSIS — R64 Cachexia: Secondary | ICD-10-CM | POA: Diagnosis present

## 2019-02-04 DIAGNOSIS — C210 Malignant neoplasm of anus, unspecified: Secondary | ICD-10-CM | POA: Diagnosis present

## 2019-02-04 DIAGNOSIS — Z933 Colostomy status: Secondary | ICD-10-CM

## 2019-02-04 DIAGNOSIS — E861 Hypovolemia: Secondary | ICD-10-CM

## 2019-02-04 DIAGNOSIS — C786 Secondary malignant neoplasm of retroperitoneum and peritoneum: Secondary | ICD-10-CM | POA: Diagnosis present

## 2019-02-04 DIAGNOSIS — R627 Adult failure to thrive: Secondary | ICD-10-CM | POA: Diagnosis present

## 2019-02-04 DIAGNOSIS — G893 Neoplasm related pain (acute) (chronic): Secondary | ICD-10-CM | POA: Diagnosis present

## 2019-02-04 DIAGNOSIS — N390 Urinary tract infection, site not specified: Secondary | ICD-10-CM | POA: Diagnosis present

## 2019-02-04 DIAGNOSIS — I9589 Other hypotension: Secondary | ICD-10-CM

## 2019-02-04 DIAGNOSIS — C775 Secondary and unspecified malignant neoplasm of intrapelvic lymph nodes: Secondary | ICD-10-CM | POA: Diagnosis present

## 2019-02-04 DIAGNOSIS — Z7189 Other specified counseling: Secondary | ICD-10-CM | POA: Diagnosis not present

## 2019-02-04 DIAGNOSIS — Z681 Body mass index (BMI) 19 or less, adult: Secondary | ICD-10-CM

## 2019-02-04 DIAGNOSIS — E2740 Unspecified adrenocortical insufficiency: Secondary | ICD-10-CM | POA: Diagnosis present

## 2019-02-04 DIAGNOSIS — E43 Unspecified severe protein-calorie malnutrition: Secondary | ICD-10-CM | POA: Diagnosis present

## 2019-02-04 DIAGNOSIS — A419 Sepsis, unspecified organism: Secondary | ICD-10-CM | POA: Diagnosis not present

## 2019-02-04 DIAGNOSIS — D473 Essential (hemorrhagic) thrombocythemia: Secondary | ICD-10-CM | POA: Diagnosis not present

## 2019-02-04 DIAGNOSIS — Z923 Personal history of irradiation: Secondary | ICD-10-CM

## 2019-02-04 DIAGNOSIS — F17210 Nicotine dependence, cigarettes, uncomplicated: Secondary | ICD-10-CM | POA: Diagnosis present

## 2019-02-04 DIAGNOSIS — Z20828 Contact with and (suspected) exposure to other viral communicable diseases: Secondary | ICD-10-CM | POA: Diagnosis present

## 2019-02-04 DIAGNOSIS — T8144XA Sepsis following a procedure, initial encounter: Secondary | ICD-10-CM | POA: Diagnosis present

## 2019-02-04 DIAGNOSIS — Z435 Encounter for attention to cystostomy: Secondary | ICD-10-CM

## 2019-02-04 DIAGNOSIS — Z515 Encounter for palliative care: Secondary | ICD-10-CM | POA: Diagnosis not present

## 2019-02-04 DIAGNOSIS — C7951 Secondary malignant neoplasm of bone: Secondary | ICD-10-CM | POA: Diagnosis present

## 2019-02-04 DIAGNOSIS — D500 Iron deficiency anemia secondary to blood loss (chronic): Secondary | ICD-10-CM | POA: Diagnosis present

## 2019-02-04 DIAGNOSIS — T8130XA Disruption of wound, unspecified, initial encounter: Secondary | ICD-10-CM | POA: Diagnosis present

## 2019-02-04 DIAGNOSIS — N360 Urethral fistula: Secondary | ICD-10-CM | POA: Diagnosis present

## 2019-02-04 DIAGNOSIS — K631 Perforation of intestine (nontraumatic): Secondary | ICD-10-CM | POA: Diagnosis not present

## 2019-02-04 DIAGNOSIS — Z823 Family history of stroke: Secondary | ICD-10-CM | POA: Diagnosis not present

## 2019-02-04 DIAGNOSIS — Z9119 Patient's noncompliance with other medical treatment and regimen: Secondary | ICD-10-CM

## 2019-02-04 DIAGNOSIS — D62 Acute posthemorrhagic anemia: Secondary | ICD-10-CM | POA: Diagnosis not present

## 2019-02-04 DIAGNOSIS — K610 Anal abscess: Secondary | ICD-10-CM | POA: Diagnosis not present

## 2019-02-04 DIAGNOSIS — D509 Iron deficiency anemia, unspecified: Secondary | ICD-10-CM | POA: Diagnosis not present

## 2019-02-04 DIAGNOSIS — I959 Hypotension, unspecified: Secondary | ICD-10-CM | POA: Diagnosis not present

## 2019-02-04 DIAGNOSIS — Z8571 Personal history of Hodgkin lymphoma: Secondary | ICD-10-CM

## 2019-02-04 LAB — COMPREHENSIVE METABOLIC PANEL
ALT: 19 U/L (ref 0–44)
AST: 15 U/L (ref 15–41)
Albumin: 2.6 g/dL — ABNORMAL LOW (ref 3.5–5.0)
Alkaline Phosphatase: 107 U/L (ref 38–126)
Anion gap: 9 (ref 5–15)
BUN: 10 mg/dL (ref 6–20)
CO2: 26 mmol/L (ref 22–32)
Calcium: 10.2 mg/dL (ref 8.9–10.3)
Chloride: 102 mmol/L (ref 98–111)
Creatinine, Ser: 0.86 mg/dL (ref 0.61–1.24)
GFR calc Af Amer: >60 mL/min (ref >60)
GFR calc non Af Amer: >60 mL/min (ref >60)
Glucose, Bld: 98 mg/dL (ref 70–99)
Potassium: 4.2 mmol/L (ref 3.5–5.1)
Sodium: 137 mmol/L (ref 135–145)
Total Bilirubin: 0.6 mg/dL (ref 0.3–1.2)
Total Protein: 8.5 g/dL — ABNORMAL HIGH (ref 6.5–8.1)

## 2019-02-04 LAB — CMP (CANCER CENTER ONLY)
ALT: 18 U/L (ref 0–44)
AST: 16 U/L (ref 15–41)
Albumin: 2.3 g/dL — ABNORMAL LOW (ref 3.5–5.0)
Alkaline Phosphatase: 102 U/L (ref 38–126)
Anion gap: 12 (ref 5–15)
BUN: 9 mg/dL (ref 6–20)
CO2: 21 mmol/L — ABNORMAL LOW (ref 22–32)
Calcium: 9.5 mg/dL (ref 8.9–10.3)
Chloride: 101 mmol/L (ref 98–111)
Creatinine: 0.78 mg/dL (ref 0.61–1.24)
GFR, Est AFR Am: >60 mL/min (ref >60)
GFR, Estimated: >60 mL/min (ref >60)
Glucose, Bld: 97 mg/dL (ref 70–99)
Potassium: 4 mmol/L (ref 3.5–5.1)
Sodium: 134 mmol/L — ABNORMAL LOW (ref 135–145)
Total Bilirubin: 0.2 mg/dL — ABNORMAL LOW (ref 0.3–1.2)
Total Protein: 8.7 g/dL — ABNORMAL HIGH (ref 6.5–8.1)

## 2019-02-04 LAB — PROTIME-INR
INR: 1.2 (ref 0.8–1.2)
Prothrombin Time: 14.7 seconds (ref 11.4–15.2)

## 2019-02-04 LAB — CBC WITH DIFFERENTIAL/PLATELET
Abs Immature Granulocytes: 0.05 10*3/uL (ref 0.00–0.07)
Basophils Absolute: 0.1 10*3/uL (ref 0.0–0.1)
Basophils Relative: 0 %
Eosinophils Absolute: 0.2 10*3/uL (ref 0.0–0.5)
Eosinophils Relative: 1 %
HCT: 27.3 % — ABNORMAL LOW (ref 39.0–52.0)
Hemoglobin: 8.1 g/dL — ABNORMAL LOW (ref 13.0–17.0)
Immature Granulocytes: 0 %
Lymphocytes Relative: 12 %
Lymphs Abs: 1.7 10*3/uL (ref 0.7–4.0)
MCH: 21.1 pg — ABNORMAL LOW (ref 26.0–34.0)
MCHC: 29.7 g/dL — ABNORMAL LOW (ref 30.0–36.0)
MCV: 71.3 fL — ABNORMAL LOW (ref 80.0–100.0)
Monocytes Absolute: 0.9 10*3/uL (ref 0.1–1.0)
Monocytes Relative: 7 %
Neutro Abs: 11.2 10*3/uL — ABNORMAL HIGH (ref 1.7–7.7)
Neutrophils Relative %: 80 %
Platelets: 506 10*3/uL — ABNORMAL HIGH (ref 150–400)
RBC: 3.83 MIL/uL — ABNORMAL LOW (ref 4.22–5.81)
RDW: 21.8 % — ABNORMAL HIGH (ref 11.5–15.5)
WBC: 14.1 10*3/uL — ABNORMAL HIGH (ref 4.0–10.5)
nRBC: 0 % (ref 0.0–0.2)

## 2019-02-04 LAB — URINALYSIS, ROUTINE W REFLEX MICROSCOPIC
Bilirubin Urine: NEGATIVE
Glucose, UA: NEGATIVE mg/dL
Ketones, ur: NEGATIVE mg/dL
Nitrite: NEGATIVE
Protein, ur: >=300 mg/dL — AB
RBC / HPF: >50 RBC/hpf — ABNORMAL HIGH (ref 0–5)
Specific Gravity, Urine: 1.02 (ref 1.005–1.030)
WBC, UA: >50 WBC/hpf — ABNORMAL HIGH (ref 0–5)
pH: 6 (ref 5.0–8.0)

## 2019-02-04 LAB — CBC WITH DIFFERENTIAL (CANCER CENTER ONLY)
Abs Immature Granulocytes: 0.05 10*3/uL (ref 0.00–0.07)
Basophils Absolute: 0.1 10*3/uL (ref 0.0–0.1)
Basophils Relative: 0 %
Eosinophils Absolute: 0.2 10*3/uL (ref 0.0–0.5)
Eosinophils Relative: 1 %
HCT: 26.8 % — ABNORMAL LOW (ref 39.0–52.0)
Hemoglobin: 7.9 g/dL — ABNORMAL LOW (ref 13.0–17.0)
Immature Granulocytes: 0 %
Lymphocytes Relative: 12 %
Lymphs Abs: 1.7 10*3/uL (ref 0.7–4.0)
MCH: 21 pg — ABNORMAL LOW (ref 26.0–34.0)
MCHC: 29.5 g/dL — ABNORMAL LOW (ref 30.0–36.0)
MCV: 71.1 fL — ABNORMAL LOW (ref 80.0–100.0)
Monocytes Absolute: 1.1 10*3/uL — ABNORMAL HIGH (ref 0.1–1.0)
Monocytes Relative: 8 %
Neutro Abs: 10.9 10*3/uL — ABNORMAL HIGH (ref 1.7–7.7)
Neutrophils Relative %: 79 %
Platelet Count: 578 10*3/uL — ABNORMAL HIGH (ref 150–400)
RBC: 3.77 MIL/uL — ABNORMAL LOW (ref 4.22–5.81)
RDW: 22.1 % — ABNORMAL HIGH (ref 11.5–15.5)
WBC Count: 13.9 10*3/uL — ABNORMAL HIGH (ref 4.0–10.5)
nRBC: 0 % (ref 0.0–0.2)

## 2019-02-04 LAB — APTT: aPTT: 36 seconds (ref 24–36)

## 2019-02-04 LAB — LACTIC ACID, PLASMA: Lactic Acid, Venous: 1.8 mmol/L (ref 0.5–1.9)

## 2019-02-04 LAB — ABO/RH: ABO/RH(D): O POS

## 2019-02-04 LAB — POC SARS CORONAVIRUS 2 AG -  ED: SARS Coronavirus 2 Ag: NEGATIVE

## 2019-02-04 MED ORDER — ACETAMINOPHEN 650 MG RE SUPP: 650.0000 mg | Freq: Four times a day (QID) | RECTAL | Status: DC | PRN

## 2019-02-04 MED ORDER — ONDANSETRON HCL 4 MG PO TABS: 4.0000 mg | ORAL_TABLET | Freq: Four times a day (QID) | ORAL | Status: DC | PRN

## 2019-02-04 MED ORDER — SODIUM CHLORIDE 0.9 % IV BOLUS (SEPSIS)
500.0000 mL | Freq: Once | INTRAVENOUS | Status: AC
  Administered 2019-02-04: 500 mL via INTRAVENOUS

## 2019-02-04 MED ORDER — METRONIDAZOLE IN NACL 5-0.79 MG/ML-% IV SOLN
500.0000 mg | Freq: Once | INTRAVENOUS | Status: AC
  Administered 2019-02-04: 500 mg via INTRAVENOUS
  Filled 2019-02-04: qty 100

## 2019-02-04 MED ORDER — ONDANSETRON HCL 4 MG/2ML IJ SOLN: 4.0000 mg | Freq: Four times a day (QID) | INTRAMUSCULAR | Status: DC | PRN

## 2019-02-04 MED ORDER — VANCOMYCIN HCL IN DEXTROSE 1-5 GM/200ML-% IV SOLN
1000.0000 mg | Freq: Once | INTRAVENOUS | Status: AC
  Administered 2019-02-04: 1000 mg via INTRAVENOUS
  Filled 2019-02-04: qty 200

## 2019-02-04 MED ORDER — SODIUM CHLORIDE 0.9 % IV SOLN: INTRAVENOUS | Status: DC

## 2019-02-04 MED ORDER — ACETAMINOPHEN 325 MG PO TABS: 650.0000 mg | ORAL_TABLET | Freq: Four times a day (QID) | ORAL | Status: DC | PRN

## 2019-02-04 MED ORDER — SODIUM CHLORIDE 0.9 % IV BOLUS (SEPSIS)
1000.0000 mL | Freq: Once | INTRAVENOUS | Status: AC
  Administered 2019-02-04: 1000 mL via INTRAVENOUS

## 2019-02-04 MED ORDER — IOHEXOL 300 MG/ML  SOLN
100.0000 mL | Freq: Once | INTRAMUSCULAR | Status: AC | PRN
  Administered 2019-02-04: 100 mL via INTRAVENOUS

## 2019-02-04 MED ORDER — HYDROMORPHONE HCL 2 MG/ML IJ SOLN
2.0000 mg | Freq: Once | INTRAMUSCULAR | Status: AC
  Administered 2019-02-04: 2 mg via INTRAVENOUS
  Filled 2019-02-04: qty 1

## 2019-02-04 MED ORDER — SODIUM CHLORIDE 0.9 % IV SOLN
2.0000 g | Freq: Three times a day (TID) | INTRAVENOUS | Status: DC
  Administered 2019-02-05 – 2019-02-09 (×13): 2 g via INTRAVENOUS
  Filled 2019-02-04 (×15): qty 2

## 2019-02-04 MED ORDER — SODIUM CHLORIDE 0.9 % IV SOLN
2.0000 g | Freq: Once | INTRAVENOUS | Status: AC
  Administered 2019-02-04: 2 g via INTRAVENOUS
  Filled 2019-02-04: qty 2

## 2019-02-04 MED ORDER — SODIUM CHLORIDE (PF) 0.9 % IJ SOLN
INTRAMUSCULAR | Status: AC
  Filled 2019-02-04: qty 50

## 2019-02-04 MED ORDER — OXYCODONE-ACETAMINOPHEN 5-325 MG PO TABS
ORAL_TABLET | ORAL | Status: AC
  Filled 2019-02-04: qty 2

## 2019-02-04 MED ORDER — ENOXAPARIN SODIUM 40 MG/0.4ML ~~LOC~~ SOLN
40.0000 mg | Freq: Every day | SUBCUTANEOUS | Status: DC
  Administered 2019-02-05 – 2019-02-09 (×5): 40 mg via SUBCUTANEOUS
  Filled 2019-02-04 (×5): qty 0.4

## 2019-02-04 MED ORDER — OXYCODONE-ACETAMINOPHEN 5-325 MG PO TABS
2.0000 | ORAL_TABLET | Freq: Once | ORAL | Status: AC
  Administered 2019-02-04: 2 via ORAL

## 2019-02-04 NOTE — H&P (Signed)
History and Physical    Dylan [name] [id number] DOB: [birth year] DOA: 02/04/2019  PCP: [name], MD   Patient coming from: Home.  I have personally briefly reviewed patient's old medical records in [place]  Chief Complaint: Hypotension and pain.  HPI: Dylan [name] is a 48 y.o. male with medical history significant of cancer, anal cancer, history of radiation therapy who is coming to the emergency department referred by oncology due to hypotension and rectal area pain.  He was also seen to have a tachycardia with a heart rate of 139 bpm and the cancer center.  The patient reports being dehydrated and weak.  He denies fever, chills, but complains of night sweats and fatigue at home.  No appetite or sleep changes.  However, as sometimes he has has difficulty sleeping because of pain in the wound area.  He denies rhinorrhea, sore throat, dyspnea, wheezing or hemoptysis.  No chest pain, palpitations, diaphoresis, PND, orthopnea or pitting edema of the lower extremities.  No melena or hematochezia.  Defecation has been difficult due to radiation therapy and rectal/perianal wound.  ED Course: Initial vital signs were temperature 97.6 F, pulse 113, blood pressure 77/58 mmHg, RR 22 BP in and O2 sat 100% on room air.  Rectal temperature measurement had to be deferred due to anal area wound.  The patient received 2 L of NS bolus, cefepime and vancomycin in the emergency department.  I added hydromorphone 2 mg IVP x1.  His urinalysis shows moderate hemoglobinuria, proteinuria, large leukocyte esterase, more than 50 RBC and more than 50 WBC with many bacteria per hpf on microscopic examination.  His CBC showed a white count is 14.1 with 80% neutrophils, 12% lymphocytes and 7% monocytes.  Hemoglobin 8.1 g/dL and platelets 506.  Previous hemoglobin was 6.8 g/dL on November 20, /2020.  PT/INR/PTT are within expected range.  Lactic acid was normal.  CMP shows a total protein of 8.5 and  albumin of 2.6 g/dL.  The rest of the values are within normal limits.  Imaging: One-view portable radiograph does not show any acute abnormalities.  Continues to shows stable right upper lobe nodules.  CT abdomen/pelvis with contrast shows interval a suprapubic catheter with anterior wall moderate to marked diffuse low-density bladder wall thickening, mucosal enhancement and surrounding soft tissue stranding likely due to marked cystitis.  Interval increase in size and soft tissue mass in the inferior aspect of the large cavity of in the perineal region.  Please see images and full regular report for further detail.  Review of Systems: As per HPI otherwise 10 point review of systems negative.   Past Medical History:  Diagnosis Date  . Cancer ([place]) 2018   anal cancer  . S/P radiation therapy 07/07/14-7/20/216   anal ca     History reviewed. No pertinent surgical history.   reports that he has been smoking cigarettes. He has been smoking about 0.30 packs per day. He uses smokeless tobacco. He reports that he does not drink alcohol. No history on file for drug.  No Known Allergies  Family History  Problem Relation Age of Onset  . Stroke Mother    Prior to Admission medications   Medication Sig Start Date End Date Taking? Authorizing Provider  acetaminophen (TYLENOL) 325 MG tablet Take 650 mg by mouth every 6 (six) hours as needed for pain. 11/21/18  Yes [provider]  enoxaparin (LOVENOX) 40 MG/0.4ML injection Inject 40 mg into the skin daily. 01/19/19  Yes  [provider]  feeding supplement, ENSURE ENLIVE, (ENSURE ENLIVE) LIQD Take 237 mLs by mouth 2 (two) times daily between meals. 10/21/18  Yes Alma Friendly, MD  ferrous sulfate 325 (65 FE) MG tablet Take 1 tablet (325 mg total) by mouth daily with breakfast. 10/22/18 02/04/19 Yes Alma Friendly, MD  gabapentin (NEURONTIN) 100 MG capsule Take 100 mg by mouth at bedtime as needed for sleep. 01/02/19  Yes  [provider]  MELATONIN PO Take 1 capsule by mouth at bedtime as needed (sleep).    Yes [provider]  metroNIDAZOLE (METROGEL) 0.75 % gel Apply 1 application topically daily. 11/21/18  Yes [provider]  Oxycodone HCl 20 MG TABS Take 20-30 mg by mouth See admin instructions. Take 1 tablet in the morning, take 1.5 tablets at lunch, and take 1 tablet at night 01/09/19  Yes [provider]  senna-docusate (SENOKOT-S) 8.6-50 MG tablet Take 1 tablet by mouth daily as needed for mild constipation.  11/21/18 03/01/19 Yes [provider]    Physical Exam: Vitals:   02/04/19 2200 02/04/19 2230 02/04/19 2300 02/04/19 2330  BP: (!) 89/57 94/66 (!) 89/54 (!) 90/53  Pulse: 87 90 93 90  Resp: (!) 23 (!) 24  20  Temp:      TempSrc:      SpO2: 100% 100% 100% 100%  Weight:      Height:        Constitutional: NAD, calm, comfortable Eyes: PERRL, lids and conjunctivae normal ENMT: Mucous membranes are moist. Posterior pharynx clear of any exudate or lesions Neck: normal, supple, no masses, no thyromegaly Respiratory: Decreased breath sounds in bases, otherwise clear to auscultation bilaterally, no wheezing, no crackles. Normal respiratory effort. No accessory muscle use.  Cardiovascular: Regular rate and rhythm, no murmurs / rubs / gallops. No extremity edema. 2+ pedal pulses. No carotid bruits.  Abdomen: Nondistended, soft, no tenderness, no masses palpated. No hepatosplenomegaly. Bowel sounds positive.  Musculoskeletal: no clubbing / cyanosis. No joint deformity upper and lower extremities. Good ROM, no contractures. Normal muscle tone.  Skin: Extensive wound of rectal and perirectal area.  Please see picture below. Neurologic: CN 2-12 grossly intact. Sensation intact, DTR normal. Strength 5/5 in all 4.  Psychiatric: Normal judgment and insight. Alert and oriented x 3. Normal mood.       Labs on Admission: I have personally reviewed following labs  and imaging studies  CBC: Recent Labs  Lab 02/04/19 1527 02/04/19 1729  WBC 13.9* 14.1*  NEUTROABS 10.9* 11.2*  HGB 7.9* 8.1*  HCT 26.8* 27.3*  MCV 71.1* 71.3*  PLT 578* [id number]*   Basic Metabolic Panel: Recent Labs  Lab 02/04/19 1527 02/04/19 1729  NA 134* 137  K 4.0 4.2  CL 101 102  CO2 21* 26  GLUCOSE 97 98  BUN 9 10  CREATININE 0.78 0.86  CALCIUM 9.5 10.2   GFR: Estimated Creatinine Clearance: 83.7 mL/min (by C-G formula based on SCr of 0.86 mg/dL). Liver Function Tests: Recent Labs  Lab 02/04/19 1527 02/04/19 1729  AST 16 15  ALT 18 19  ALKPHOS 102 107  BILITOT 0.2* 0.6  PROT 8.7* 8.5*  ALBUMIN 2.3* 2.6*   No results for input(s): LIPASE, AMYLASE in the last 168 hours. No results for input(s): AMMONIA in the last 168 hours. Coagulation Profile: Recent Labs  Lab 02/04/19 1729  INR 1.2   Cardiac Enzymes: No results for input(s): CKTOTAL, CKMB, CKMBINDEX, TROPONINI in the last 168 hours. BNP (last  3 results) No results for input(s): PROBNP in the last 8760 hours. HbA1C: No results for input(s): HGBA1C in the last 72 hours. CBG: No results for input(s): GLUCAP in the last 168 hours. Lipid Profile: No results for input(s): CHOL, HDL, LDLCALC, TRIG, CHOLHDL, LDLDIRECT in the last 72 hours. Thyroid Function Tests: No results for input(s): TSH, T4TOTAL, FREET4, T3FREE, THYROIDAB in the last 72 hours. Anemia Panel: No results for input(s): VITAMINB12, FOLATE, FERRITIN, TIBC, IRON, RETICCTPCT in the last 72 hours. Urine analysis:    Component Value Date/Time   COLORURINE YELLOW 02/04/2019 2030   APPEARANCEUR TURBID (A) 02/04/2019 2030   LABSPEC 1.020 02/04/2019 2030   PHURINE 6.0 02/04/2019 2030   GLUCOSEU NEGATIVE 02/04/2019 2030   HGBUR MODERATE (A) 02/04/2019 2030   [place] NEGATIVE 02/04/2019 2030   [place] 02/04/2019 2030   PROTEINUR >=300 (A) 02/04/2019 2030   NITRITE NEGATIVE 02/04/2019 2030   [place] (A) 02/04/2019  2030    Radiological Exams on Admission: CT ABDOMEN PELVIS W CONTRAST  Result Date: 02/04/2019 CLINICAL DATA:  Hypotension and tachycardia. Severe rectal pain. Status post anal and scrotal cancer resection 3 weeks ago with a diverting ostomy. EXAM: CT ABDOMEN AND PELVIS WITH CONTRAST TECHNIQUE: Multidetector CT imaging of the abdomen and pelvis was performed using the standard protocol following bolus administration of intravenous contrast. CONTRAST:  141mL OMNIPAQUE IOHEXOL 300 MG/ML  SOLN COMPARISON:  10/18/2018. FINDINGS: Lower chest: Multiple interval small nodules in the right lower lobe and right middle lobe. The largest is in the right lower lobe, measuring 6 mm in maximum diameter on image number 13 series 4. Normal sized heart. Hepatobiliary: No focal liver abnormality is seen. No gallstones, gallbladder wall thickening, or biliary dilatation. Pancreas: Unremarkable. No pancreatic ductal dilatation or surrounding inflammatory changes. Spleen: Normal in size without focal abnormality. Adrenals/Urinary Tract: Interval suprapubic Foley catheter in the urinary bladder with interval moderate to marked diffuse low density bladder wall thickening, mucosal enhancement and surrounding soft tissue stranding. Unremarkable adrenal glands, kidneys and ureters. Stomach/Bowel: Unremarkable stomach, small bowel and colon with an interval left lower quadrant ostomy. Vascular/Lymphatic: Mild atheromatous arterial calcifications without aneurysm. The previously described mildly prominent, enhancing, ill-defined right external iliac and distal common iliac artery lymph nodes are again demonstrated. A proximal right external iliac node has a short axis diameter of 6 mm on image number 59 series 2, previously 8 mm. Reproductive: Moderately enlarged and heterogeneous prostate gland with a mild increase in low density heterogeneity, contiguous with the thickened bladder wall posteriorly. This is also contiguous with a mass  protruding into the previously demonstrated open cavity in the perineal region. Inferiorly, this mass measures 3.3 x 2.6 cm on image number 75 series 2. A moderate-sized right hydrocele has not changed significantly. Other: There is an interval increase in size of a soft tissue mass at the inferior aspect of the large cavity in the perineal region, at the level of the proximal right thigh. This mass measures 4.8 x 2.6 cm on image number 92 series 2, previously approximately 2.4 x 1.6 cm. Musculoskeletal: Stable ill-defined curvilinear density in the L3 vertebral body and patchy increased and decreased density in the right iliac bone. Multiple small bilateral femoral head cysts are again noted. IMPRESSION: 1. Interval suprapubic Foley catheter with interval moderate to marked diffuse low density bladder wall thickening, mucosal enhancement and surrounding soft tissue stranding, compatible with marked cystitis. 2. Interval increase in size of a soft tissue mass in the inferior aspect of  the large cavity in the perineal region, at the level of the proximal right thigh, compatible with the patient's known malignancy. 3. No significant change in an additional mass in the superior aspect of the large cavity, compatible with additional known malignancy. 4. Interval multiple small nodules in the right lower lobe and right middle lobe, suspicious for metastases. 5. Mildly improved probable right common and external iliac metastatic adenopathy. 6. Stable ill-defined curvilinear density in the L3 vertebral body and patchy increased and decreased density in the right iliac bone. These could represent treated metastases. 7. Stable moderate-sized right hydrocele. Electronically Signed   By: Claudie Revering M.D.   On: 02/04/2019 19:25   DG Chest Portable 1 View  Result Date: 02/04/2019 CLINICAL DATA:  Sepsis. Hypotension. Recently resected anal cancer. EXAM: PORTABLE CHEST 1 VIEW COMPARISON:  11/07/2018. FINDINGS: Two small,  nodular densities are again projected over the right upper lung zone and are not within ribs. Otherwise, clear lungs. Normal sized heart. Mild lower thoracic spine degenerative changes. IMPRESSION: 1. Stable small, right upper lobe nodules. Again, further evaluation with a chest CT with contrast is recommended. 2. No acute abnormality. Electronically Signed   By: Claudie Revering M.D.   On: 02/04/2019 18:11    EKG: Independently reviewed. Vent. rate 101 BPM PR interval * ms QRS duration 83 ms QT/QTc 336/436 ms P-R-T axes 90 93 86 Sinus tachycardia Right atrial enlargement Borderline right axis deviation Consider left ventricular hypertrophy Baseline wander in lead(s) V5  Assessment/Plan Principal Problem:   Sepsis due to undetermined organism ([place])   Sepsis secondary to UTI ([place]) Admit to stepdown/inpatient. Continue IV fluids. Supplemental oxygen as needed. Continue cefepime per pharmacy. Follow blood culture and sensitivity. Follow-up urine culture and sensitivity.  Active Problems:   Disruption of wound of perineum in male   Anal cellulitis Continue cefepime, metronidazole and vancomycin. Continue wound care.    Anal cancer [facility]) Continue management per oncology. Continue wound care for anal area wound/cellulitis.    Microcytic anemia Due to iron deficiency. Normal folate last month. Normal vitamin B12 in September. Monitor H&H and transfuse as needed..    DVT prophylaxis: Lovenox SQ. Code Status: Full code. Family Communication: Disposition Plan: Admit for IV antibiotics for 2 to 3 days. Consults called:  Admission status: Inpatient/stepdown.   Reubin Milan MD [facility]  If 7PM-7AM, please contact night-coverage [email or web address]  02/04/2019, 11:48 PM   This document was prepared using Dragon voice recognition software and may contain some unintended transcription errors.

## 2019-02-04 NOTE — ED Triage Notes (Signed)
The patient has a history of anal cancer which had been untreated until about 3 weeks ago when he had diversion surgery. Today he had a follow up at the cancer center where they found his blood pressure to be about 82/50 and heart rate 139. He complains of 10/10 rectal pain. He was given percocet about half an hour ago at the cancer center. They reported he was dehydrated and weak with a hemoglobin level of 7.9. They suggested home health and wound care with discharge.

## 2019-02-04 NOTE — Progress Notes (Addendum)
Pharmacy Antibiotic Note  Dylan [name] is a 48 y.o. male admitted on 02/04/2019 with UTI.  Pharmacy has been consulted for cefepime dosing.  Plan: Cefepime 2 Gm IV q8h Vancomycin 1 Gm IV q12h for est AUC = 478 Goal AUC = 400-550 Use scr = 0.86 F/u scr/cultures  Height: 6\' 1"  (185.4 cm) Weight: 127 lb (57.6 kg) IBW/kg (Calculated) : 79.9  Temp (24hrs), Avg:98 F (36.7 C), Min:97.6 F (36.4 C), Max:98.3 F (36.8 C)  Recent Labs  Lab 02/04/19 1527 02/04/19 1729  WBC 13.9* 14.1*  CREATININE 0.78 0.86  LATICACIDVEN  --  1.8    Estimated Creatinine Clearance: 83.7 mL/min (by C-G formula based on SCr of 0.86 mg/dL).    No Known Allergies  Antimicrobials this admission: 12/21 cefepime >>  12/21 vancomycin >>  Dose adjustments this admission:   Microbiology results:  BCx:   UCx:    Sputum:    MRSA PCR:  Thank you for allowing pharmacy to be a part of this patient's care.  Dorrene German 02/04/2019 11:06 PM

## 2019-02-04 NOTE — Progress Notes (Signed)
A consult was received from an ED physician for vancomycin and cefepime per pharmacy dosing (for an indication other than meningitis). The patient's profile has been reviewed for ht/wt/allergies/indication/available labs. A one time order has been placed for the above antibiotics.  Further antibiotics/pharmacy consults should be ordered by admitting physician if indicated.                       Reuel Boom, PharmD, BCPS [phone] 02/04/2019, 5:13 PM

## 2019-02-04 NOTE — Progress Notes (Signed)
Pt c/o 9/10-10/10 pain in his rectal/anal area. Dr. Lorenso Courier aware. 2 Percocet 5/325 given to patient. Pt states he feels quite weak, needs assist transferring from w/c to recliner. Tachycardic and BP low. Alert and oriented. Unable to assess rectal wound at this time. Colostomy and ileostomy bags intact and draining. Poor appetite, pain control issues. Lab in room to obtain CBC, CMP, sample to blood bank, Ferritin, Iron studies sent. Dr. Lorenso Courier made call to ED for pt transfer. Pt to be transported via w/c to room 15. Report given upon arrival to [place], [place]

## 2019-02-04 NOTE — ED Provider Notes (Signed)
[facility] DEPT Provider Note   CSN: [id number] Arrival date & time: 02/04/19  1607     History Chief Complaint  Patient presents with  . Rectal Pain  . Hypotension    Dylan [name] is a 48 y.o. male.  The history is provided by the patient and medical records. No language interpreter was used.  Illness Location:  [place] Quality:  Wound Severity:  Severe Onset quality:  Gradual Duration:  4 weeks Timing:  Constant Progression:  Worsening Chronicity:  New Associated symptoms: fatigue   Associated symptoms: no abdominal pain, no chest pain, no congestion, no cough, no diarrhea, no fever, no headaches, no loss of consciousness, no nausea, no rash, no rhinorrhea, no shortness of breath, no vomiting and no wheezing        Past Medical History:  Diagnosis Date  . Cancer ([place]) 2018   anal cancer  . S/P radiation therapy 07/07/14-7/20/216   anal ca     Patient Active Problem List   Diagnosis Date Noted  . Malnutrition of moderate degree 10/20/2018  . Hypotension 10/18/2018  . Tachycardia 10/18/2018  . Anemia 10/18/2018  . Iron deficiency 10/18/2018  . Sepsis ([place]) 10/18/2018  . Anal cancer ([place]) 02/10/2014    No past surgical history on file.     Family History  Problem Relation Age of Onset  . Stroke Mother     Social History   Tobacco Use  . Smoking status: Current Some Day Smoker    Packs/day: 0.30    Types: Cigarettes  . Smokeless tobacco: Current User  Substance Use Topics  . Alcohol use: No  . Drug use: Not on file    Home Medications Prior to Admission medications   Medication Sig Start Date End Date Taking? Authorizing Provider  feeding supplement, ENSURE ENLIVE, (ENSURE ENLIVE) LIQD Take 237 mLs by mouth 2 (two) times daily between meals. 10/21/18   Alma Friendly, MD  ferrous sulfate 325 (65 FE) MG tablet Take 1 tablet (325 mg total) by mouth daily with breakfast. 10/22/18 11/21/18  Alma Friendly, MD   MELATONIN PO Take by mouth.    [provider]  oxyCODONE (OXY IR/ROXICODONE) 5 MG immediate release tablet Take 20 mg by mouth every 4 (four) hours as needed for severe pain. Takes 1 tablet in the morning, 1.5 tablets in the afternoon and 1 tablet @@ night    [provider]  polyethylene glycol (MIRALAX / GLYCOLAX) 17 g packet Take 17 g by mouth daily. As needed    [provider]    Allergies    Patient has no known allergies.  Review of Systems   Review of Systems  Constitutional: Positive for fatigue. Negative for chills, diaphoresis and fever.  HENT: Negative for congestion and rhinorrhea.   Respiratory: Negative for cough, chest tightness, shortness of breath, wheezing and stridor.   Cardiovascular: Negative for chest pain, palpitations and leg swelling.  Gastrointestinal: Positive for rectal pain. Negative for abdominal pain, constipation, diarrhea, nausea and vomiting.  Genitourinary: Negative for flank pain and frequency.  Musculoskeletal: Negative for neck pain and neck stiffness.  Skin: Negative for rash.  Neurological: Positive for light-headedness. Negative for dizziness, loss of consciousness, syncope and headaches.  Psychiatric/Behavioral: Negative for agitation.  All other systems reviewed and are negative.   Physical Exam Updated Vital Signs BP (!) 77/58 (BP Location: Right Arm)   Pulse (!) 113   Temp 97.6 F (36.4 C) (Oral)   Resp Marland Kitchen)  22   SpO2 100%   Physical Exam Vitals and nursing note reviewed.  Constitutional:      General: He is not in acute distress.    Appearance: He is well-developed. He is not ill-appearing, toxic-appearing or diaphoretic.  HENT:     Head: Normocephalic and atraumatic.     Nose: No congestion or rhinorrhea.     Mouth/Throat:     Mouth: Mucous membranes are moist.     Pharynx: No oropharyngeal exudate or posterior oropharyngeal erythema.  Eyes:     Conjunctiva/sclera: Conjunctivae normal.      Pupils: Pupils are equal, round, and reactive to light.  Cardiovascular:     Rate and Rhythm: Regular rhythm. Tachycardia present.     Heart sounds: No murmur.  Pulmonary:     Effort: Pulmonary effort is normal. Tachypnea present. No respiratory distress.     Breath sounds: Normal breath sounds. No wheezing, rhonchi or rales.  Chest:     Chest wall: No tenderness.  Abdominal:     General: Abdomen is flat.     Palpations: Abdomen is soft.     Tenderness: There is no abdominal tenderness. There is no right CVA tenderness or left CVA tenderness.    Genitourinary:      Comments: See clinical photo Musculoskeletal:        General: Tenderness present.     Cervical back: Neck supple.     Right lower leg: No edema.     Left lower leg: No edema.  Skin:    General: Skin is warm and dry.     Capillary Refill: Capillary refill takes less than 2 seconds.     Findings: Erythema present.  Neurological:     General: No focal deficit present.     Mental Status: He is alert.  Psychiatric:        Mood and Affect: Mood normal.     ED Results / Procedures / Treatments   Labs (all labs ordered are listed, but only abnormal results are displayed) Labs Reviewed  COMPREHENSIVE METABOLIC PANEL - Abnormal; Notable for the following components:      Result Value   Total Protein 8.5 (*)    Albumin 2.6 (*)    All other components within normal limits  CBC WITH DIFFERENTIAL/PLATELET - Abnormal; Notable for the following components:   WBC 14.1 (*)    RBC 3.83 (*)    Hemoglobin 8.1 (*)    HCT 27.3 (*)    MCV 71.3 (*)    MCH 21.1 (*)    MCHC 29.7 (*)    RDW 21.8 (*)    Platelets 506 (*)    Neutro Abs 11.2 (*)    All other components within normal limits  URINALYSIS, ROUTINE W REFLEX MICROSCOPIC - Abnormal; Notable for the following components:   APPearance TURBID (*)    Hgb urine dipstick MODERATE (*)    Protein, ur >=300 (*)    Leukocytes,Ua LARGE (*)    RBC / HPF >50 (*)    WBC, UA >50  (*)    Bacteria, UA MANY (*)    All other components within normal limits  CULTURE, BLOOD (ROUTINE X 2)  CULTURE, BLOOD (ROUTINE X 2)  URINE CULTURE  SARS CORONAVIRUS 2 (TAT 6-24 HRS)  LACTIC ACID, PLASMA  PROTIME-INR  APTT  LACTIC ACID, PLASMA  CBC WITH DIFFERENTIAL/PLATELET  COMPREHENSIVE METABOLIC PANEL  POC SARS CORONAVIRUS 2 AG -  ED  TYPE AND SCREEN  ABO/RH  EKG EKG Interpretation  Date/Time:  Monday February 04 2019 17:40:21 EST Ventricular Rate:  101 PR Interval:    QRS Duration: 83 QT Interval:  336 QTC Calculation: 436 R Axis:   93 Text Interpretation: Sinus tachycardia Right atrial enlargement Borderline right axis deviation Consider left ventricular hypertrophy Baseline wander in lead(s) V5 When compared to prior, more wandering baseline. No STEMI Confirmed by Antony Blackbird [phone]) on 02/04/2019 6:29:58 PM   Radiology CT ABDOMEN PELVIS W CONTRAST  Result Date: 02/04/2019 CLINICAL DATA:  Hypotension and tachycardia. Severe rectal pain. Status post anal and scrotal cancer resection 3 weeks ago with a diverting ostomy. EXAM: CT ABDOMEN AND PELVIS WITH CONTRAST TECHNIQUE: Multidetector CT imaging of the abdomen and pelvis was performed using the standard protocol following bolus administration of intravenous contrast. CONTRAST:  190mL OMNIPAQUE IOHEXOL 300 MG/ML  SOLN COMPARISON:  10/18/2018. FINDINGS: Lower chest: Multiple interval small nodules in the right lower lobe and right middle lobe. The largest is in the right lower lobe, measuring 6 mm in maximum diameter on image number 13 series 4. Normal sized heart. Hepatobiliary: No focal liver abnormality is seen. No gallstones, gallbladder wall thickening, or biliary dilatation. Pancreas: Unremarkable. No pancreatic ductal dilatation or surrounding inflammatory changes. Spleen: Normal in size without focal abnormality. Adrenals/Urinary Tract: Interval suprapubic Foley catheter in the urinary bladder with interval  moderate to marked diffuse low density bladder wall thickening, mucosal enhancement and surrounding soft tissue stranding. Unremarkable adrenal glands, kidneys and ureters. Stomach/Bowel: Unremarkable stomach, small bowel and colon with an interval left lower quadrant ostomy. Vascular/Lymphatic: Mild atheromatous arterial calcifications without aneurysm. The previously described mildly prominent, enhancing, ill-defined right external iliac and distal common iliac artery lymph nodes are again demonstrated. A proximal right external iliac node has a short axis diameter of 6 mm on image number 59 series 2, previously 8 mm. Reproductive: Moderately enlarged and heterogeneous prostate gland with a mild increase in low density heterogeneity, contiguous with the thickened bladder wall posteriorly. This is also contiguous with a mass protruding into the previously demonstrated open cavity in the perineal region. Inferiorly, this mass measures 3.3 x 2.6 cm on image number 75 series 2. A moderate-sized right hydrocele has not changed significantly. Other: There is an interval increase in size of a soft tissue mass at the inferior aspect of the large cavity in the perineal region, at the level of the proximal right thigh. This mass measures 4.8 x 2.6 cm on image number 92 series 2, previously approximately 2.4 x 1.6 cm. Musculoskeletal: Stable ill-defined curvilinear density in the L3 vertebral body and patchy increased and decreased density in the right iliac bone. Multiple small bilateral femoral head cysts are again noted. IMPRESSION: 1. Interval suprapubic Foley catheter with interval moderate to marked diffuse low density bladder wall thickening, mucosal enhancement and surrounding soft tissue stranding, compatible with marked cystitis. 2. Interval increase in size of a soft tissue mass in the inferior aspect of the large cavity in the perineal region, at the level of the proximal right thigh, compatible with the  patient's known malignancy. 3. No significant change in an additional mass in the superior aspect of the large cavity, compatible with additional known malignancy. 4. Interval multiple small nodules in the right lower lobe and right middle lobe, suspicious for metastases. 5. Mildly improved probable right common and external iliac metastatic adenopathy. 6. Stable ill-defined curvilinear density in the L3 vertebral body and patchy increased and decreased density in the right iliac bone. These could  represent treated metastases. 7. Stable moderate-sized right hydrocele. Electronically Signed   By: Claudie Revering M.D.   On: 02/04/2019 19:25   DG Chest Portable 1 View  Result Date: 02/04/2019 CLINICAL DATA:  Sepsis. Hypotension. Recently resected anal cancer. EXAM: PORTABLE CHEST 1 VIEW COMPARISON:  11/07/2018. FINDINGS: Two small, nodular densities are again projected over the right upper lung zone and are not within ribs. Otherwise, clear lungs. Normal sized heart. Mild lower thoracic spine degenerative changes. IMPRESSION: 1. Stable small, right upper lobe nodules. Again, further evaluation with a chest CT with contrast is recommended. 2. No acute abnormality. Electronically Signed   By: Claudie Revering M.D.   On: 02/04/2019 18:11    Procedures Procedures (including critical care time)  CRITICAL CARE Performed by: Gwenyth Allegra Azhar Yogi Total critical care time: 35 minutes Critical care time was exclusive of separately billable procedures and treating other patients. Critical care was necessary to treat or prevent imminent or life-threatening deterioration. Critical care was time spent personally by me on the following activities: development of treatment plan with patient and/or surrogate as well as nursing, discussions with consultants, evaluation of patient's response to treatment, examination of patient, obtaining history from patient or surrogate, ordering and performing treatments and interventions,  ordering and review of laboratory studies, ordering and review of radiographic studies, pulse oximetry and re-evaluation of patient's condition.   Medications Ordered in ED Medications  0.9 %  sodium chloride infusion (has no administration in time range)  sodium chloride 0.9 % bolus 500 mL (has no administration in time range)  ceFEPIme (MAXIPIME) 2 g in sodium chloride 0.9 % 100 mL IVPB (has no administration in time range)  ceFEPIme (MAXIPIME) 2 g in sodium chloride 0.9 % 100 mL IVPB (0 g Intravenous Stopped 02/04/19 2025)  metroNIDAZOLE (FLAGYL) IVPB 500 mg (0 mg Intravenous Stopped 02/04/19 2234)  vancomycin (VANCOCIN) IVPB 1000 mg/200 mL premix (0 mg Intravenous Stopped 02/04/19 2025)  sodium chloride 0.9 % bolus 1,000 mL (0 mLs Intravenous Stopped 02/04/19 2135)    And  sodium chloride 0.9 % bolus 1,000 mL (0 mLs Intravenous Stopped 02/04/19 2135)  iohexol (OMNIPAQUE) 300 MG/ML solution 100 mL (100 mLs Intravenous Contrast Given 02/04/19 1830)  sodium chloride (PF) 0.9 % injection (  Given by Other 02/04/19 2126)  HYDROmorphone (DILAUDID) injection 2 mg (2 mg Intravenous Given 02/04/19 2243)    ED Course  I have reviewed the triage vital signs and the nursing notes.  Pertinent labs & imaging results that were available during my care of the patient were reviewed by me and considered in my medical decision making (see chart for details).    MDM Rules/Calculators/A&P                      Dylan [name] is a 48 y.o. male with a past medical history significant for anal cancer status post recent diversion surgery 3 weeks ago at [facility] who presents at the direction of his oncologist here for hypotension, rectal pain, and dehydration.  According to the patient, since his surgery several weeks ago he has been gradually worsening.  He reports that his wound continues to bleed intermittently.  He reports that he is feeling very lightheaded when he tries to stand specifically.  He feels that he  is getting dehydrated is not eating or drinking as much with nausea.  He denies vomiting.  He denies any anterior abdominal or chest pain.  Denies any fevers, chills, congestion, or cough.  He denies chest pain or shortness of breath.  He does report that he has had normal bowel movements through his ostomy and has had normal urination through his suprapubic catheter.  He is primarily complaining of generalized weakness, fatigue, pain in his surgical site, nausea, and feeling dehydrated.  Patient's vital signs on my initial evaluation was tachycardia, tachypnea, and a systolic blood pressure of 77.  Patient cannot get a rectal temperature due to his prior surgical site however he is warm to the touch despite an oral temperature being normal.  On exam, with a chaperone, patient's dressing was pulled out and he has a foul smell and purulence seen in his large wound.  There is also some feculent appearance which might suggest some sort of fistulization.  Patient reports that he took 2 Percocet before come to the emergency department and his pain has improved but is still hurting him.  He had clear lungs and nontender chest.  Abdomen otherwise nontender but with ostomy with stool and suprapubic catheter that was nontender.  Patient has normal sensation and strength in both legs.  He has good grip strength bilaterally.  Patient otherwise resting.  Clinically I am concerned about a wound infection on his sacrum potentially leading to sepsis with his vital signs.  He was made a code sepsis for hypotension and concern for infection.  Will get CT imaging to look for abscess however will go ahead and give broad-spectrum antibiotics and fluids.  His hemoglobin earlier today was 7.9 which is similar to prior.  Will trend.  Anticipate admission for further management.    Final Clinical Impression(s) / ED Diagnoses Final diagnoses:  Hypotension, unspecified hypotension type  Sepsis, due to unspecified organism,  unspecified whether acute organ dysfunction present [facility])  Dehydration    Clinical Impression: 1. Hypotension, unspecified hypotension type   2. Sepsis, due to unspecified organism, unspecified whether acute organ dysfunction present ([place])   3. Dehydration     Disposition: Admit  This note was prepared with assistance of Dragon voice recognition software. Occasional wrong-word or sound-a-like substitutions may have occurred due to the inherent limitations of voice recognition software.     Hildagarde Holleran, Gwenyth Allegra, MD 02/04/19 [phone]

## 2019-02-05 ENCOUNTER — Telehealth: Payer: Self-pay | Admitting: Hematology and Oncology

## 2019-02-05 ENCOUNTER — Encounter (HOSPITAL_COMMUNITY): Payer: Self-pay | Admitting: Internal Medicine

## 2019-02-05 DIAGNOSIS — K610 Anal abscess: Secondary | ICD-10-CM

## 2019-02-05 DIAGNOSIS — Z435 Encounter for attention to cystostomy: Secondary | ICD-10-CM

## 2019-02-05 DIAGNOSIS — D500 Iron deficiency anemia secondary to blood loss (chronic): Secondary | ICD-10-CM

## 2019-02-05 DIAGNOSIS — A419 Sepsis, unspecified organism: Secondary | ICD-10-CM

## 2019-02-05 DIAGNOSIS — C210 Malignant neoplasm of anus, unspecified: Secondary | ICD-10-CM

## 2019-02-05 DIAGNOSIS — N360 Urethral fistula: Secondary | ICD-10-CM

## 2019-02-05 DIAGNOSIS — D62 Acute posthemorrhagic anemia: Secondary | ICD-10-CM

## 2019-02-05 DIAGNOSIS — T8130XA Disruption of wound, unspecified, initial encounter: Secondary | ICD-10-CM

## 2019-02-05 DIAGNOSIS — Z933 Colostomy status: Secondary | ICD-10-CM

## 2019-02-05 DIAGNOSIS — D473 Essential (hemorrhagic) thrombocythemia: Secondary | ICD-10-CM

## 2019-02-05 LAB — CBC WITH DIFFERENTIAL/PLATELET
Abs Immature Granulocytes: 0.04 10*3/uL (ref 0.00–0.07)
Basophils Absolute: 0 10*3/uL (ref 0.0–0.1)
Basophils Relative: 0 %
Eosinophils Absolute: 0.3 10*3/uL (ref 0.0–0.5)
Eosinophils Relative: 3 %
HCT: 22.2 % — ABNORMAL LOW (ref 39.0–52.0)
Hemoglobin: 6.6 g/dL — CL (ref 13.0–17.0)
Immature Granulocytes: 0 %
Lymphocytes Relative: 14 %
Lymphs Abs: 1.5 10*3/uL (ref 0.7–4.0)
MCH: 21.4 pg — ABNORMAL LOW (ref 26.0–34.0)
MCHC: 29.7 g/dL — ABNORMAL LOW (ref 30.0–36.0)
MCV: 72.1 fL — ABNORMAL LOW (ref 80.0–100.0)
Monocytes Absolute: 0.8 10*3/uL (ref 0.1–1.0)
Monocytes Relative: 8 %
Neutro Abs: 7.9 10*3/uL — ABNORMAL HIGH (ref 1.7–7.7)
Neutrophils Relative %: 75 %
Platelets: 430 10*3/uL — ABNORMAL HIGH (ref 150–400)
RBC: 3.08 MIL/uL — ABNORMAL LOW (ref 4.22–5.81)
RDW: 21.8 % — ABNORMAL HIGH (ref 11.5–15.5)
WBC: 10.6 10*3/uL — ABNORMAL HIGH (ref 4.0–10.5)
nRBC: 0 % (ref 0.0–0.2)

## 2019-02-05 LAB — PREPARE RBC (CROSSMATCH)

## 2019-02-05 LAB — COMPREHENSIVE METABOLIC PANEL
ALT: 15 U/L (ref 0–44)
AST: 10 U/L — ABNORMAL LOW (ref 15–41)
Albumin: 2 g/dL — ABNORMAL LOW (ref 3.5–5.0)
Alkaline Phosphatase: 70 U/L (ref 38–126)
Anion gap: 5 (ref 5–15)
BUN: 9 mg/dL (ref 6–20)
CO2: 24 mmol/L (ref 22–32)
Calcium: 8.9 mg/dL (ref 8.9–10.3)
Chloride: 105 mmol/L (ref 98–111)
Creatinine, Ser: 0.75 mg/dL (ref 0.61–1.24)
GFR calc Af Amer: >60 mL/min (ref >60)
GFR calc non Af Amer: >60 mL/min (ref >60)
Glucose, Bld: 109 mg/dL — ABNORMAL HIGH (ref 70–99)
Potassium: 4.3 mmol/L (ref 3.5–5.1)
Sodium: 134 mmol/L — ABNORMAL LOW (ref 135–145)
Total Bilirubin: 0.4 mg/dL (ref 0.3–1.2)
Total Protein: 6.9 g/dL (ref 6.5–8.1)

## 2019-02-05 LAB — CBC
HCT: 29.7 % — ABNORMAL LOW (ref 39.0–52.0)
Hemoglobin: 9 g/dL — ABNORMAL LOW (ref 13.0–17.0)
MCH: 23 pg — ABNORMAL LOW (ref 26.0–34.0)
MCHC: 30.3 g/dL (ref 30.0–36.0)
MCV: 75.8 fL — ABNORMAL LOW (ref 80.0–100.0)
Platelets: 461 10*3/uL — ABNORMAL HIGH (ref 150–400)
RBC: 3.92 MIL/uL — ABNORMAL LOW (ref 4.22–5.81)
RDW: 23.9 % — ABNORMAL HIGH (ref 11.5–15.5)
WBC: 9.9 10*3/uL (ref 4.0–10.5)
nRBC: 0 % (ref 0.0–0.2)

## 2019-02-05 LAB — IRON AND TIBC
Iron: 8 ug/dL — ABNORMAL LOW (ref 42–163)
Saturation Ratios: 4 % — ABNORMAL LOW (ref 20–55)
TIBC: 178 ug/dL — ABNORMAL LOW (ref 202–409)
UIBC: 170 ug/dL (ref 117–376)

## 2019-02-05 LAB — CORTISOL: Cortisol, Plasma: 11.6 ug/dL

## 2019-02-05 LAB — MRSA PCR SCREENING: MRSA by PCR: NEGATIVE

## 2019-02-05 LAB — FERRITIN: Ferritin: 184 ng/mL (ref 24–336)

## 2019-02-05 LAB — SARS CORONAVIRUS 2 (TAT 6-24 HRS): SARS Coronavirus 2: NEGATIVE

## 2019-02-05 LAB — LACTIC ACID, PLASMA: Lactic Acid, Venous: 1.2 mmol/L (ref 0.5–1.9)

## 2019-02-05 MED ORDER — METRONIDAZOLE 0.75 % EX GEL
1.0000 "application " | Freq: Every day | CUTANEOUS | Status: DC
  Administered 2019-02-06 – 2019-02-09 (×4): 1 via TOPICAL
  Filled 2019-02-05 (×4): qty 45

## 2019-02-05 MED ORDER — ENSURE ENLIVE PO LIQD
237.0000 mL | Freq: Two times a day (BID) | ORAL | Status: DC
  Administered 2019-02-05 – 2019-02-08 (×6): 237 mL via ORAL
  Filled 2019-02-05 (×2): qty 237

## 2019-02-05 MED ORDER — OXYCODONE HCL 5 MG PO TABS
20.0000 mg | ORAL_TABLET | Freq: Four times a day (QID) | ORAL | Status: DC | PRN
  Administered 2019-02-05 – 2019-02-09 (×9): 20 mg via ORAL
  Filled 2019-02-05 (×9): qty 4

## 2019-02-05 MED ORDER — GABAPENTIN 100 MG PO CAPS: 100.0000 mg | ORAL_CAPSULE | Freq: Every evening | ORAL | Status: DC | PRN

## 2019-02-05 MED ORDER — METRONIDAZOLE IN NACL 5-0.79 MG/ML-% IV SOLN
500.0000 mg | Freq: Three times a day (TID) | INTRAVENOUS | Status: DC
  Administered 2019-02-05 – 2019-02-06 (×4): 500 mg via INTRAVENOUS
  Filled 2019-02-05 (×4): qty 100

## 2019-02-05 MED ORDER — SODIUM CHLORIDE 0.9 % IV SOLN
INTRAVENOUS | Status: DC | PRN
  Administered 2019-02-05: 500 mL via INTRAVENOUS

## 2019-02-05 MED ORDER — VANCOMYCIN HCL IN DEXTROSE 1-5 GM/200ML-% IV SOLN
1000.0000 mg | Freq: Two times a day (BID) | INTRAVENOUS | Status: DC
  Administered 2019-02-05 – 2019-02-07 (×5): 1000 mg via INTRAVENOUS
  Filled 2019-02-05 (×5): qty 200

## 2019-02-05 MED ORDER — FERROUS SULFATE 325 (65 FE) MG PO TABS
325.0000 mg | ORAL_TABLET | Freq: Every day | ORAL | Status: DC
  Administered 2019-02-05 – 2019-02-09 (×5): 325 mg via ORAL
  Filled 2019-02-05 (×5): qty 1

## 2019-02-05 MED ORDER — SODIUM CHLORIDE 0.9% IV SOLUTION: Freq: Once | INTRAVENOUS | Status: AC

## 2019-02-05 MED ORDER — LACTATED RINGERS IV SOLN: INTRAVENOUS | Status: DC

## 2019-02-05 MED ORDER — CHLORHEXIDINE GLUCONATE CLOTH 2 % EX PADS
6.0000 | MEDICATED_PAD | Freq: Every day | CUTANEOUS | Status: DC
  Administered 2019-02-05 – 2019-02-09 (×5): 6 via TOPICAL

## 2019-02-05 MED ORDER — MIDODRINE HCL 5 MG PO TABS: 5.0000 mg | ORAL_TABLET | Freq: Two times a day (BID) | ORAL | Status: DC | PRN

## 2019-02-05 MED ORDER — SENNOSIDES-DOCUSATE SODIUM 8.6-50 MG PO TABS: 1.0000 | ORAL_TABLET | Freq: Every day | ORAL | Status: DC | PRN

## 2019-02-05 NOTE — Telephone Encounter (Signed)
Scheduled per los. Called, no answer, no voicemail available. Mailed printout

## 2019-02-05 NOTE — Consult Note (Signed)
[facility] Consult/Admission Note  Dylan [name] 1971/01/07  [id number].    Requesting Provider: Dr. Tawanna Solo Chief Complaint/Reason for Consult: rectal wound  HPI:   Patient is a 48 yo male with a history of metastatic anal cancer who underwent surgery at [facility] for diverting colostomy, suprapubic catheter placement and tumor tissue excised on 01/17/19. For extensive oncology history see note from Dr. Narda Rutherford dated 02/04/19. We were asked to see for rectal wound.   Patient was sent to ED by Dr. Lorenso Courier yesterday from his office. Patient has been admitted by the hospitalist team for sepsis. Patient is complaining of severe, non radiating, pain of his lower rectal area. He states he does not pack his wound at home but keeps it clean in the shower daily. Since his operation at [facility] on 12/03, he has not been as functional and has not been taking his daily showers. Patient endorses stool from his backside and into his colostomy bag. He states foul odor and discharge from his backside for roughly one week.   ROS:  Review of Systems  Constitutional: Positive for diaphoresis and malaise/fatigue. Negative for chills and fever.  HENT: Negative for sore throat.   Respiratory: Negative for cough and shortness of breath.   Cardiovascular: Negative for chest pain.  Gastrointestinal: Positive for blood in stool (from bottom). Negative for abdominal pain, constipation, diarrhea, nausea and vomiting.  Genitourinary:       + for rectal wound with foul odor and discharge  Skin: Negative for rash.  Neurological: Negative for dizziness, focal weakness and loss of consciousness.  All other systems reviewed and are negative.    Family History  Problem Relation Age of Onset  . Stroke Mother     Past Medical History:  Diagnosis Date  . Cancer ([place]) 2018   anal cancer  . S/P radiation therapy 07/07/14-7/20/216   anal ca     History reviewed. No pertinent surgical history.  Social  History:  reports that he has been smoking cigarettes. He has been smoking about 0.30 packs per day. He uses smokeless tobacco. He reports that he does not drink alcohol. No history on file for drug.  Allergies: No Known Allergies  (Not in a hospital admission)   Blood pressure (!) 91/58, pulse 85, temperature 97.8 F (36.6 C), temperature source Oral, resp. rate 18, height 6' 1"  (1.854 m), weight 57.6 kg, SpO2 100 %.  Physical Exam Vitals and nursing note reviewed. Exam conducted with a chaperone present.  Constitutional:      General: He is not in acute distress.    Appearance: Normal appearance. He is cachectic. He is ill-appearing. He is not toxic-appearing or diaphoretic.  HENT:     Head: Normocephalic and atraumatic.     Nose: Nose normal.     Mouth/Throat:     Lips: Pink.     Mouth: Mucous membranes are moist.  Eyes:     General: No scleral icterus.       Right eye: No discharge.        Left eye: No discharge.     Conjunctiva/sclera: Conjunctivae normal.     Pupils: Pupils are equal, round, and reactive to light.  Cardiovascular:     Rate and Rhythm: Normal rate and regular rhythm.     Heart sounds: Normal heart sounds, S1 normal and S2 normal.  Pulmonary:     Effort: Pulmonary effort is normal. No tachypnea, bradypnea or respiratory distress.  Abdominal:     General:  There is no distension.     Palpations: Abdomen is soft. Abdomen is not rigid.     Tenderness: There is no abdominal tenderness. There is no guarding.     Comments: Colostomy with brown stool in bag, suprapubic cath in place  Genitourinary:    Comments: Very large ulcerated partially necrotic wound with foul odor and discharge (see photo) measured 15x9x3cm. Scrotum with what looks like a yeast infection and erythema.  Musculoskeletal:        General: No tenderness or deformity. Normal range of motion.     Cervical back: Normal range of motion and neck supple.     Right lower leg: No edema.     Left  lower leg: No edema.  Skin:    General: Skin is warm and dry.     Findings: No rash.     Comments: R ASIS decub stage I  Neurological:     Mental Status: He is alert and oriented to person, place, and time.        Results for orders placed or performed during the hospital encounter of 02/04/19 (from the past 48 hour(s))  ABO/Rh     Status: None   Collection Time: 02/04/19  3:27 PM  Result Value Ref Range   ABO/RH(D)      O POS Performed at [facility], [place] [address], [place], [place] [address]   Comprehensive metabolic panel     Status: Abnormal   Collection Time: 02/04/19  5:29 PM  Result Value Ref Range   Sodium 137 135 - 145 mmol/L   Potassium 4.2 3.5 - 5.1 mmol/L   Chloride 102 98 - 111 mmol/L   CO2 26 22 - 32 mmol/L   Glucose, Bld 98 70 - 99 mg/dL   BUN 10 6 - 20 mg/dL   Creatinine, Ser 0.86 0.61 - 1.24 mg/dL   Calcium 10.2 8.9 - 10.3 mg/dL   Total Protein 8.5 (H) 6.5 - 8.1 g/dL   Albumin 2.6 (L) 3.5 - 5.0 g/dL   AST 15 15 - 41 U/L   ALT 19 0 - 44 U/L   Alkaline Phosphatase 107 38 - 126 U/L   Total Bilirubin 0.6 0.3 - 1.2 mg/dL   GFR calc non Af Amer >60 >60 mL/min   GFR calc Af Amer >60 >60 mL/min   Anion gap 9 5 - 15    Comment: Performed at [facility], [place] [address] [place], [place] [address]  Lactic acid, plasma     Status: None   Collection Time: 02/04/19  5:29 PM  Result Value Ref Range   Lactic Acid, Venous 1.8 0.5 - 1.9 mmol/L    Comment: Performed at [facility], [place] [address] [place], [place] [address]  CBC with Differential     Status: Abnormal   Collection Time: 02/04/19  5:29 PM  Result Value Ref Range   WBC 14.1 (H) 4.0 - 10.5 K/uL   RBC 3.83 (L) 4.22 - 5.81 MIL/uL   Hemoglobin 8.1 (L) 13.0 - 17.0 g/dL    Comment: Reticulocyte Hemoglobin testing may be clinically indicated, consider ordering this additional test [id number]    HCT 27.3 (L) 39.0 - 52.0 %   MCV 71.3 (L) 80.0 -  100.0 fL   MCH 21.1 (L) 26.0 - 34.0 pg   MCHC 29.7 (L) 30.0 - 36.0 g/dL   RDW 21.8 (H) 11.5 - 15.5 %   Platelets 506 (H) 150 - 400 K/uL  nRBC 0.0 0.0 - 0.2 %   Neutrophils Relative % 80 %   Neutro Abs 11.2 (H) 1.7 - 7.7 K/uL   Lymphocytes Relative 12 %   Lymphs Abs 1.7 0.7 - 4.0 K/uL   Monocytes Relative 7 %   Monocytes Absolute 0.9 0.1 - 1.0 K/uL   Eosinophils Relative 1 %   Eosinophils Absolute 0.2 0.0 - 0.5 K/uL   Basophils Relative 0 %   Basophils Absolute 0.1 0.0 - 0.1 K/uL   Immature Granulocytes 0 %   Abs Immature Granulocytes 0.05 0.00 - 0.07 K/uL   Tear Drop Cells PRESENT    Polychromasia PRESENT    Target Cells PRESENT    Ovalocytes PRESENT     Comment: Performed at [facility], [place] [address], [place], [place] [address]  Protime-INR     Status: None   Collection Time: 02/04/19  5:29 PM  Result Value Ref Range   Prothrombin Time 14.7 11.4 - 15.2 seconds   INR 1.2 0.8 - 1.2    Comment: (NOTE) INR goal varies based on device and disease states. Performed at [facility], [place] [address], [place], [place] [address]   Culture, blood (Routine x 2)     Status: None (Preliminary result)   Collection Time: 02/04/19  5:29 PM   Specimen: BLOOD  Result Value Ref Range   Specimen Description      BLOOD LEFT ANTECUBITAL Performed at [facility], [place] [address], [place], [place] [address]    Special Requests      BOTTLES DRAWN AEROBIC AND ANAEROBIC Blood Culture adequate volume Performed at [place] [address], [place], [place] [address]    Culture      NO GROWTH < 12 HOURS Performed at [place] [address], [place], [place] [address]    Report Status PENDING   Culture, blood (Routine x 2)     Status: None (Preliminary result)   Collection Time: 02/04/19  5:29 PM   Specimen: BLOOD  Result Value Ref Range   Specimen Description      BLOOD RIGHT WRIST Performed at [place] [address], [place], [place] [address]    Special Requests      BOTTLES DRAWN AEROBIC AND ANAEROBIC Blood Culture adequate volume Performed at [place] [address], [place], [place] [address]    Culture      NO GROWTH < 12 HOURS Performed at [place] [address], [place], [place] [address]    Report Status PENDING   APTT     Status: None   Collection Time: 02/04/19  5:29 PM  Result Value Ref Range   aPTT 36 24 - 36 seconds    Comment: Performed at [facility], [place] [address] [place],  [address]  Type and screen [place]     Status: None (Preliminary result)   Collection Time: 02/04/19  5:29 PM  Result Value Ref Range   ABO/RH(D) O POS    Antibody Screen NEG    Sample Expiration 02/07/2019,2359    Unit Number [id number]    Blood Component Type RED CELLS,LR    Unit division 00    Status of Unit ISSUED    Transfusion Status OK TO TRANSFUSE    Crossmatch Result      Compatible Performed at [facility], [place] [address] [place],  [address]  POC SARS Coronavirus 2 Ag-ED - Nasal Swab (BD Veritor Kit)     Status: None   Collection Time: 02/04/19  8:01 PM  Result Value Ref Range   SARS Coronavirus 2 Ag NEGATIVE NEGATIVE    Comment: (NOTE) SARS-CoV-2 antigen NOT DETECTED.  Negative results are presumptive.  Negative results do not preclude SARS-CoV-2 infection and should not be used as the sole basis for treatment or other patient management decisions, including infection  control decisions, particularly in the presence of clinical signs and  symptoms consistent with COVID-19, or in those who have been in contact with the virus.  Negative results must be combined with clinical observations, patient history, and epidemiological information. The expected result is Negative. Fact Sheet for Patients:  PodPark.tn Fact Sheet for Healthcare Providers: GiftContent.is This test is not yet approved or cleared by the [place] FDA and  has been authorized for detection and/or diagnosis of SARS-CoV-2 by FDA under an Emergency Use Authorization (EUA).  This EUA will remain in effect (meaning this test can be used) for the duration of  the COVID-19 de claration under Section 564(b)(1) of the Act, 21 U.S.C. section 360bbb-3(b)(1), unless the authorization is terminated or revoked sooner.   Urinalysis, Routine w reflex microscopic     Status: Abnormal   Collection Time: 02/04/19  8:30 PM  Result Value Ref Range   Color, Urine YELLOW YELLOW   APPearance TURBID (A) CLEAR   Specific Gravity, Urine 1.020 1.005 - 1.030   pH 6.0 5.0 - 8.0   Glucose, UA NEGATIVE NEGATIVE mg/dL   Hgb urine dipstick MODERATE (A) NEGATIVE   Bilirubin Urine NEGATIVE NEGATIVE   Ketones, ur NEGATIVE NEGATIVE mg/dL   Protein, ur >=300 (A) NEGATIVE mg/dL   Nitrite NEGATIVE NEGATIVE   Leukocytes,Ua LARGE (A) NEGATIVE   RBC / HPF >50 (H) 0 - 5 RBC/hpf   WBC, UA >50 (H) 0 - 5 WBC/hpf   Bacteria, UA MANY (A) NONE SEEN   Mucus PRESENT     Comment: Performed at [facility], [place] [address], [place], [place] [address]  SARS CORONAVIRUS 2 (TAT 6-24 HRS) Nasopharyngeal Nasopharyngeal Swab     Status: None   Collection Time: 02/04/19  9:36 PM   Specimen: Nasopharyngeal Swab  Result Value Ref Range   SARS Coronavirus 2 NEGATIVE NEGATIVE    Comment: (NOTE) SARS-CoV-2 target nucleic acids are NOT DETECTED. The SARS-CoV-2 RNA is generally detectable in upper and lower respiratory specimens during the acute phase of infection. Negative results do not preclude SARS-CoV-2 infection, do not rule out co-infections with other pathogens, and should not be used as the sole basis for treatment or other patient management decisions. Negative results must be  combined with clinical observations, patient history, and epidemiological information. The expected result is Negative. Fact Sheet for Patients: SugarRoll.be Fact Sheet for Healthcare Providers: https://www.woods-mathews.com/ This test is not yet approved or cleared by the [place] FDA and  has been authorized for detection and/or diagnosis of SARS-CoV-2 by FDA under an Emergency Use Authorization (EUA). This EUA will remain  in effect (meaning this test can be used) for the duration of the COVID-19 declaration under Section 56 4(b)(1) of the Act, 21 U.S.C. section 360bbb-3(b)(1), unless the authorization is terminated or revoked sooner. Performed at [facility], [place] [address] [place], [place] [address]   CBC with Differential     Status: Abnormal   Collection Time: 02/05/19  4:55 AM  Result Value Ref Range  WBC 10.6 (H) 4.0 - 10.5 K/uL   RBC 3.08 (L) 4.22 - 5.81 MIL/uL   Hemoglobin 6.6 (LL) 13.0 - 17.0 g/dL    Comment: REPEATED TO VERIFY Reticulocyte Hemoglobin testing may be clinically indicated, consider ordering this additional test [id number] THIS CRITICAL RESULT HAS VERIFIED AND BEEN CALLED TO HODGES,I BY POTEAT,SHANNON ON 12 22 2020 AT 0516, AND HAS BEEN READ BACK. CRITICAL RESULT VERIFIED    HCT 22.2 (L) 39.0 - 52.0 %   MCV 72.1 (L) 80.0 - 100.0 fL   MCH 21.4 (L) 26.0 - 34.0 pg   MCHC 29.7 (L) 30.0 - 36.0 g/dL   RDW 21.8 (H) 11.5 - 15.5 %   Platelets 430 (H) 150 - 400 K/uL   nRBC 0.0 0.0 - 0.2 %   Neutrophils Relative % 75 %   Neutro Abs 7.9 (H) 1.7 - 7.7 K/uL   Lymphocytes Relative 14 %   Lymphs Abs 1.5 0.7 - 4.0 K/uL   Monocytes Relative 8 %   Monocytes Absolute 0.8 0.1 - 1.0 K/uL   Eosinophils Relative 3 %   Eosinophils Absolute 0.3 0.0 - 0.5 K/uL   Basophils Relative 0 %   Basophils Absolute 0.0 0.0 - 0.1 K/uL   Immature Granulocytes 0 %   Abs Immature Granulocytes 0.04 0.00 - 0.07 K/uL   Polychromasia  PRESENT    Target Cells PRESENT    Ovalocytes PRESENT     Comment: Performed at [facility], [place] [address], [place], [place] [address]  Comprehensive metabolic panel     Status: Abnormal   Collection Time: 02/05/19  4:55 AM  Result Value Ref Range   Sodium 134 (L) 135 - 145 mmol/L   Potassium 4.3 3.5 - 5.1 mmol/L   Chloride 105 98 - 111 mmol/L   CO2 24 22 - 32 mmol/L   Glucose, Bld 109 (H) 70 - 99 mg/dL   BUN 9 6 - 20 mg/dL   Creatinine, Ser 0.75 0.61 - 1.24 mg/dL   Calcium 8.9 8.9 - 10.3 mg/dL   Total Protein 6.9 6.5 - 8.1 g/dL   Albumin 2.0 (L) 3.5 - 5.0 g/dL   AST 10 (L) 15 - 41 U/L   ALT 15 0 - 44 U/L   Alkaline Phosphatase 70 38 - 126 U/L   Total Bilirubin 0.4 0.3 - 1.2 mg/dL   GFR calc non Af Amer >60 >60 mL/min   GFR calc Af Amer >60 >60 mL/min   Anion gap 5 5 - 15    Comment: Performed at [facility], [place] [address] [place], [place] [address]  Prepare RBC     Status: None   Collection Time: 02/05/19  5:24 AM  Result Value Ref Range   Order Confirmation      ORDER PROCESSED BY BLOOD BANK Performed at [facility], [place] [address], [place], [place] [address]   Lactic acid, plasma     Status: None   Collection Time: 02/05/19  7:34 AM  Result Value Ref Range   Lactic Acid, Venous 1.2 0.5 - 1.9 mmol/L    Comment: Performed at [facility], [place] [address], [place], [place] [address]   CT ABDOMEN PELVIS W CONTRAST  Result Date: 02/04/2019 CLINICAL DATA:  Hypotension and tachycardia. Severe rectal pain. Status post anal and scrotal cancer resection 3 weeks ago with a diverting ostomy. EXAM: CT ABDOMEN AND PELVIS WITH CONTRAST TECHNIQUE: Multidetector CT imaging of the abdomen and pelvis was  performed using the standard protocol following bolus administration of intravenous contrast. CONTRAST:  143m OMNIPAQUE IOHEXOL 300 MG/ML  SOLN COMPARISON:  10/18/2018. FINDINGS: Lower chest: Multiple interval  small nodules in the right lower lobe and right middle lobe. The largest is in the right lower lobe, measuring 6 mm in maximum diameter on image number 13 series 4. Normal sized heart. Hepatobiliary: No focal liver abnormality is seen. No gallstones, gallbladder wall thickening, or biliary dilatation. Pancreas: Unremarkable. No pancreatic ductal dilatation or surrounding inflammatory changes. Spleen: Normal in size without focal abnormality. Adrenals/Urinary Tract: Interval suprapubic Foley catheter in the urinary bladder with interval moderate to marked diffuse low density bladder wall thickening, mucosal enhancement and surrounding soft tissue stranding. Unremarkable adrenal glands, kidneys and ureters. Stomach/Bowel: Unremarkable stomach, small bowel and colon with an interval left lower quadrant ostomy. Vascular/Lymphatic: Mild atheromatous arterial calcifications without aneurysm. The previously described mildly prominent, enhancing, ill-defined right external iliac and distal common iliac artery lymph nodes are again demonstrated. A proximal right external iliac node has a short axis diameter of 6 mm on image number 59 series 2, previously 8 mm. Reproductive: Moderately enlarged and heterogeneous prostate gland with a mild increase in low density heterogeneity, contiguous with the thickened bladder wall posteriorly. This is also contiguous with a mass protruding into the previously demonstrated open cavity in the perineal region. Inferiorly, this mass measures 3.3 x 2.6 cm on image number 75 series 2. A moderate-sized right hydrocele has not changed significantly. Other: There is an interval increase in size of a soft tissue mass at the inferior aspect of the large cavity in the perineal region, at the level of the proximal right thigh. This mass measures 4.8 x 2.6 cm on image number 92 series 2, previously approximately 2.4 x 1.6 cm. Musculoskeletal: Stable ill-defined curvilinear density in the L3 vertebral  body and patchy increased and decreased density in the right iliac bone. Multiple small bilateral femoral head cysts are again noted. IMPRESSION: 1. Interval suprapubic Foley catheter with interval moderate to marked diffuse low density bladder wall thickening, mucosal enhancement and surrounding soft tissue stranding, compatible with marked cystitis. 2. Interval increase in size of a soft tissue mass in the inferior aspect of the large cavity in the perineal region, at the level of the proximal right thigh, compatible with the patient's known malignancy. 3. No significant change in an additional mass in the superior aspect of the large cavity, compatible with additional known malignancy. 4. Interval multiple small nodules in the right lower lobe and right middle lobe, suspicious for metastases. 5. Mildly improved probable right common and external iliac metastatic adenopathy. 6. Stable ill-defined curvilinear density in the L3 vertebral body and patchy increased and decreased density in the right iliac bone. These could represent treated metastases. 7. Stable moderate-sized right hydrocele. Electronically Signed   By: SClaudie ReveringM.D.   On: 02/04/2019 19:25   DG Chest Portable 1 View  Result Date: 02/04/2019 CLINICAL DATA:  Sepsis. Hypotension. Recently resected anal cancer. EXAM: PORTABLE CHEST 1 VIEW COMPARISON:  11/07/2018. FINDINGS: Two small, nodular densities are again projected over the right upper lung zone and are not within ribs. Otherwise, clear lungs. Normal sized heart. Mild lower thoracic spine degenerative changes. IMPRESSION: 1. Stable small, right upper lobe nodules. Again, further evaluation with a chest CT with contrast is recommended. 2. No acute abnormality. Electronically Signed   By: SClaudie ReveringM.D.   On: 02/04/2019 18:11      Assessment/Plan Principal Problem:  Sepsis due to undetermined organism [facility]) Active Problems:   Anal cancer (HCC)   Microcytic anemia   Sepsis  secondary to UTI ([place])   Disruption of wound of perineum in male   Anal cellulitis   Septic shock ([place])  Metastatic anal cancer with large ulcerated partially necrotic rectal wound - no undrained fluid collection seen on CT - no indication for surgical intervention at this time - this wound would benefit from hydrotherapy and BID wet to dry dressing changes - patient may benefit from transfer to [place] where he had his most recent surgery  Thank you for the consult. We will sign off at this time. Please page [place] with any further needs for this patient.   [facility] 02/05/2019, 10:45 AM Please see amion for pager for the following: Cristine Polio, & Friday 7:00am - 4:30pm Thursdays 7:00am -11:30am

## 2019-02-05 NOTE — Consult Note (Signed)
[facility] nurse consulted for need for ostomy supplies. Provided Kellie Simmering #s for needed items.   Will follow up when [facility] is on this campus tomorrow for any other needs.  Noted surgery has been consulted for cancerous rectal wound.  [place], [place], [place]

## 2019-02-05 NOTE — Progress Notes (Signed)
[place] [phone]   [phone]  INPATIENT PROGRESS NOTE  Patient Care Team: [name], MD as PCP - General (Family Medicine) Jacqualine Mau Aletha Halim, NP as Nurse Practitioner (Hospice and Palliative Medicine) Stitzenberg, Clint Lipps, MD as Referring Physician (Surgical Oncology) Gwinda Maine, MD as Referring Physician (Surgical Oncology)  Hematological/Oncological History # Metastatic Anal Cancer (summarized history obtained from [facility] Surgical Notes) 1) 03/09/10: CT scan performed showed superficial inflammation along the medial crease of the left buttocks within the subcutaneous fat measuring 2.5 x 5 cm including no areas of deep penetration with a few associated bubbles of air and gas present.  2) 03/10/10: A 2 x 2 cm area of condylomata was present in the perianal area that was excised by Dr. [name], along with multiple I&Ds of left gluteal abscesses. 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 No lymph nodes biopsied. 3) 03/29/10: PET scan showed abnormal 5.7 x 7.1 cm hypermetabolic soft tissue in the perianal region is compatible with patient's history of squamous cell carcinoma. Bilateral inguinal adenopathy, largest on left is hypermetabolic and measures 1.8 x 2.9 cm, and largest on the right measures 1.2 cm without hypermetabolism; question secondary to infection or metastatic involvement 4) 04/2010: Evaluated by Radiation Oncologist Dr. [name] at [facility] He was not deemed a candidate for radiation given concern for metastatic disease based on PET scan. 5) 06/22/10: Recommended to begin systemic therapy with cisplatin and continuous infusion 5-FU for metastatic disease. 6) 07/2010: Re-evaluated at [facility] by Dr. [name] and Dr. [name], as he did not have biopsy proven metastatic disease, and inguinal adenopathy may be reactive due to infection/inflammation/abscess. 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 9) 02/10/14: After lost to follow-up, he returned to [facility] for evaluation by Dr. [name] due to concern for recurrence of disease throughout the perineum and perianal areas. The most worrisome is a large ulcer on the left perianal area.  10) 02/18/14: Exam under anesthesia with biopsies performed. Left perianal region biopsy confirmed invasive squamous cell carcinoma, moderate-to-poorly differentiated. Patient refused at this time APR. 11) 05/01/14: PET CT: Perianal and left groin/penile malignancy with bilateral inguinal and left external iliac metastases. 12) 07/07/14 - 09/03/14: XRT. Patient declined recommended concurrent 5FU/[facility]. At [place]. Patient refused APR. 13) 12/19/16: PET CT: No suspicious metabolically active osseous lesions are identified - Interval increase in the extent of the hypermetabolic activity involving the region of the anus which extends up into the fascia adjacent the rectum and out into the bilateral buttocks associated with soft tissue erosion and skin thickening. 14) 12/19/16: MRI Pelvis: Motion artefact. Anal tumor with gross invasion of the anal sphincters and extension into the ischioanal fossa, with irregular appearance of the perineum/gluteal skin, also abutting the pelvic sidewall (left > right) and likely invading prostate apex. Overall, this is increased from prior CT 01/05/2015, concerning for disease progression. Exam revealed obliteration of the anus. Patient refused APR. 15) 03/2018 - consideration for pembrolizumab - patient declined. 16) 10/2018 - represents with substantial bleeding episodes from erosive, locally-advanced anal cancer  17)  11/12/2018: reviewed by [facility] surgery again. Admitted for consideration of ostomy from 10/4 to 11/21/2018. 18) 12/10/18-12/12/18: patient seen by [facility] (  Dr. Dennison Nancy) and Surgical Oncology.  MRI pelvis, and CT chest performed. 19) 12/19/2018: establish care with Dr. Lorenso Courier  20)  12/21/2018: 4th opinion received at [facility], [place] with Dr. [name].  21) 01/17/2019: Underwent a diverting colostomy with placement of a colostomy bag at [facility]. Tumor tissue excised for pathological review.   #High Grade Non-Hodgkins Lymphoma 1)1999: He was treated for high-grade Non-Hodgkin's Lymphoma by Dr. [name] in [place], [place]. He received a year of chemotherapy. No radiation.   Interval History:  Morey L Kruzel 48 y.o. male was sent to the emergency room from the cancer center secondary to hypotension and pain.  He was also noted to be tachycardic.  Blood pressure in the emergency room was 77/58.  He received 2 L normal saline bolus was started on cefepime and vancomycin.  Admission lab work showed an elevated white blood cell count at 14.1, low hemoglobin 8.1, and mildly elevated platelet count of 506,000.  He had a low total protein 8.5 and albumin 2.6.  Blood cultures were drawn and are negative to date.  Urinalysis showed a large amount of leukocytes and many bacteria.  Urine culture is pending.  A CT of the abdomen pelvis with contrast was obtained which showed moderate to marked diffuse low-density bladder wall thickening, mucosal enhancement and surrounding soft tissue stranding compatible with marked cystitis, interval increase in the size of the soft tissue mass in the inferior aspect of the large cavity in the perineal region compatible with the patient's known malignancy, interval multiple small nodules in the right lower lobe and right middle lobe suspicious for metastases, mildly improved probable right common and external iliac metastatic adenopathy, stable L3 vertebral body  density which could represent a treated metastases.  He was admitted for sepsis.  The patient remains in the emergency room.  He is awaiting bed placement.  Currently receiving 1 unit PRBC at the time my visit.  He states that his pain is controlled.  He has now laying on his side to avoid putting pressure on his sacrum.  He remains afebrile.  Blood pressures running 80s to 90s over 50s to low 60s.  10 point review of systems documented below.  MEDICAL HISTORY:  Past Medical History:  Diagnosis Date   Cancer ([place]) 2018   anal cancer   S/P radiation therapy 07/07/14-7/20/216   anal ca     SURGICAL HISTORY: History reviewed. No pertinent surgical history.  SOCIAL HISTORY: Social History   Socioeconomic History   Marital status: Single    Spouse name: Not on file   Number of children: Not on file   Years of education: Not on file   Highest education level: Not on file  Occupational History   Not on file  Tobacco Use   Smoking status: Current Some Day Smoker    Packs/day: 0.30    Types: Cigarettes   Smokeless tobacco: Current User  Substance and Sexual Activity   Alcohol use: No   Drug use: Not on file   Sexual activity: Not on file  Other Topics Concern   Not on file  Social History Narrative   Not on file   Social Determinants of Health   Financial Resource Strain:    Difficulty of Paying Living Expenses: Not on file  Food Insecurity:    Worried About [place] in the Last Year: Not on file   Ran Out of Food in the Last Year: Not on file  Transportation Needs:  Lack of Transportation (Medical): Not on file   Lack of Transportation (Non-Medical): Not on file  Physical Activity:    Days of Exercise per Week: Not on file   Minutes of Exercise per Session: Not on file  Stress:    Feeling of Stress : Not on file  Social Connections:    Frequency of Communication with Friends and Family: Not on file   Frequency of Social Gatherings  with Friends and Family: Not on file   Attends Religious Services: Not on file   Active Member of Clubs or Organizations: Not on file   Attends Archivist Meetings: Not on file   Marital Status: Not on file  Intimate Partner Violence:    Fear of Current or Ex-Partner: Not on file   Emotionally Abused: Not on file   Physically Abused: Not on file   Sexually Abused: Not on file    FAMILY HISTORY: Family History  Problem Relation Age of Onset   Stroke Mother     ALLERGIES:  has No Known Allergies.  MEDICATIONS:  Current Facility-Administered Medications  Medication Dose Route Frequency Provider Last Rate Last Admin   0.9 %  sodium chloride infusion   Intravenous Continuous Reubin Milan, MD 100 mL/hr at 02/04/19 2334 New Bag at 02/04/19 2334   acetaminophen (TYLENOL) tablet 650 mg  650 mg Oral Q6H PRN Reubin Milan, MD       Or   acetaminophen (TYLENOL) suppository 650 mg  650 mg Rectal Q6H PRN Reubin Milan, MD       ceFEPIme (MAXIPIME) 2 g in sodium chloride 0.9 % 100 mL IVPB  2 g Intravenous Q8H Reubin Milan, MD   Stopped at 02/05/19 0442   enoxaparin (LOVENOX) injection 40 mg  40 mg Subcutaneous Daily Reubin Milan, MD   40 mg at 02/05/19 1007   feeding supplement (ENSURE ENLIVE) (ENSURE ENLIVE) liquid 237 mL  237 mL Oral BID BM Reubin Milan, MD       ferrous sulfate tablet 325 mg  325 mg Oral Q breakfast Reubin Milan, MD   325 mg at 02/05/19 9628   gabapentin (NEURONTIN) capsule 100 mg  100 mg Oral QHS PRN Reubin Milan, MD       metroNIDAZOLE (FLAGYL) IVPB 500 mg  500 mg Intravenous Q8H Reubin Milan, MD   Stopped at 02/05/19 [phone]   metroNIDAZOLE (METROGEL) 9.47 % gel 1 application  1 application Topical Daily Reubin Milan, MD       ondansetron [facility]) tablet 4 mg  4 mg Oral Q6H PRN Reubin Milan, MD       Or   ondansetron [facility]) injection 4 mg  4 mg Intravenous Q6H PRN  Reubin Milan, MD       oxyCODONE (Oxy IR/ROXICODONE) immediate release tablet 20 mg  20 mg Oral QID PRN Reubin Milan, MD   20 mg at 02/05/19 6546   senna-docusate (Senokot-S) tablet 1 tablet  1 tablet Oral Daily PRN Reubin Milan, MD       vancomycin (VANCOCIN) IVPB 1000 mg/200 mL premix  1,000 mg Intravenous Q12H Dorrene German, [place] at 02/05/19 5035   Current Outpatient Medications  Medication Sig Dispense Refill   acetaminophen (TYLENOL) 325 MG tablet Take 650 mg by mouth every 6 (six) hours as needed for pain.     enoxaparin (LOVENOX) 40 MG/0.4ML injection Inject 40 mg into the skin daily.  feeding supplement, ENSURE ENLIVE, (ENSURE ENLIVE) LIQD Take 237 mLs by mouth 2 (two) times daily between meals. 237 mL 0   ferrous sulfate 325 (65 FE) MG tablet Take 1 tablet (325 mg total) by mouth daily with breakfast. 30 tablet 0   gabapentin (NEURONTIN) 100 MG capsule Take 100 mg by mouth at bedtime as needed for sleep.     MELATONIN PO Take 1 capsule by mouth at bedtime as needed (sleep).      metroNIDAZOLE (METROGEL) 0.75 % gel Apply 1 application topically daily.     Oxycodone HCl 20 MG TABS Take 20-30 mg by mouth See admin instructions. Take 1 tablet in the morning, take 1.5 tablets at lunch, and take 1 tablet at night     senna-docusate (SENOKOT-S) 8.6-50 MG tablet Take 1 tablet by mouth daily as needed for mild constipation.       REVIEW OF SYSTEMS:   Constitutional: ( - ) fevers, ( - )  chills , ( - ) night sweats Eyes: ( - ) blurriness of vision, ( - ) double vision, ( - ) watery eyes Ears, nose, mouth, throat, and face: ( - ) mucositis, ( - ) sore throat Respiratory: ( - ) cough, ( - ) dyspnea, ( - ) wheezes Cardiovascular: ( - ) palpitation, ( - ) chest discomfort, ( - ) lower extremity swelling Gastrointestinal:  ( - ) nausea, ( - ) heartburn, ( - ) change in bowel habits Skin: ( - ) abnormal skin rashes  Lymphatics: ( - ) new  lymphadenopathy, ( - ) easy bruising Neurological: ( - ) numbness, ( - ) tingling, ( - ) new weaknesses Behavioral/Psych: ( - ) mood change, ( - ) new changes  All other systems were reviewed with the patient and are negative.  PHYSICAL EXAMINATION: ECOG PERFORMANCE STATUS: 4 - Bedbound  Vitals:   02/05/19 1045 02/05/19 1100  BP:  (!) 87/53  Pulse: 82 79  Resp:  18  Temp:    SpO2: 100% 100%   GENERAL: acute on chronically ill appearing middle aged [place] American male. alert, no distress and comfortable SKIN: Extensive wound of the rectal and perirectal area.  Please refer to photo documented in H&P.  Malodorous. EYES: conjunctiva are pink and non-injected, sclera clear LUNGS: clear to auscultation and percussion with normal breathing effort HEART: Regular rate and rhythm, no murmurs and no lower extremity edema ABDOMEN: soft, non-tender, non-distended, normal bowel sounds  Musculoskeletal: no cyanosis of digits and no clubbing  PSYCH: alert & oriented x 3, fluent speech NEURO: no focal motor/sensory deficits  LABORATORY DATA:  I have reviewed the data as listed Recent Results (from the past 2160 hour(s))  POCT CBC     Status: Abnormal   Collection Time: 11/07/18  2:50 PM  Result Value Ref Range   WBC 12.0 (A) 4.6 - 10.2 K/uL   Lymph, poc 17.1 (A) 0.6 - 3.4   POC LYMPH PERCENT 3.1 (A) 10 - 50 %L   MID (cbc) 79.8 (A) 0 - 0.9   POC MID % 2.1 0 - 12 %M   POC Granulocyte 0.4 (A) 2 - 6.9   Granulocyte percent 9.6 (A) 37 - 80 %G   RBC 3.89 (A) 4.69 - 6.13 M/uL   Hemoglobin 7.7 (A) 11 - 14.6 g/dL   HCT, POC 25.4 (A) 29 - 41 %   MCV 65.3 (A) 76 - 111 fL   MCH, POC 19.7 (A) 27 - 31.2 pg  MCHC 30.2 (A) 31.8 - 35.4 g/dL   RDW, POC 19.0 %   Platelet Count, POC 588 (A) 142 - 424 K/uL   MPV 6.0 0 - 99.8 fL  CEA (IN HOUSE-[facility])     Status: None   Collection Time: 12/19/18  2:42 PM  Result Value Ref Range   CEA ([facility]-In House) 4.35 0.00 - 5.00 ng/mL    Comment: (NOTE) This test  was performed using Architect's Chemiluminescent Microparticle Immunoassay. Values obtained from different assay methods cannot be used interchangeably. Please note that 5-10% of patients who smoke may see CEA levels up to 6.9 ng/mL. Performed at [facility], [place] [address], [place], [place] [address]   Ferritin     Status: None   Collection Time: 12/19/18  2:42 PM  Result Value Ref Range   Ferritin 92 24 - 336 ng/mL    Comment: Performed at [facility], [place] [address] [place], [place] [address]  Iron and TIBC     Status: Abnormal   Collection Time: 12/19/18  2:42 PM  Result Value Ref Range   Iron <5 (L) 42 - 163 ug/dL   TIBC 176 (L) 202 - 409 ug/dL   Saturation Ratios UNABLE TO CALCULATE 20 - 55 %   UIBC UNABLE TO CALCULATE 117 - 376 ug/dL    Comment: Performed at [facility], [place] [address], [place], [place] [address]  Magnesium     Status: None   Collection Time: 12/19/18  2:42 PM  Result Value Ref Range   Magnesium 1.8 1.7 - 2.4 mg/dL    Comment: Performed at [facility], [place] [address], [place], [place] [address]  Lactate dehydrogenase (LDH)     Status: Abnormal   Collection Time: 12/19/18  2:42 PM  Result Value Ref Range   LDH 71 (L) 98 - 192 U/L    Comment: Performed at [facility], [place] [address] [place], [place] [address]  CMP ([place] only)     Status: Abnormal   Collection Time: 12/19/18  2:42 PM  Result Value Ref Range   Sodium 135 135 - 145 mmol/L   Potassium 4.1 3.5 - 5.1 mmol/L   Chloride 99 98 - 111 mmol/L   CO2 26 22 - 32 mmol/L   Glucose, Bld 84 70 - 99 mg/dL   BUN 10 6 - 20 mg/dL   Creatinine 0.89 0.61 - 1.24 mg/dL   Calcium 9.3 8.9 - 10.3 mg/dL   Total Protein 8.4 (H) 6.5 - 8.1 g/dL   Albumin 2.2 (L) 3.5 - 5.0 g/dL   AST 11 (L) 15 - 41 U/L   ALT 13 0 - 44 U/L   Alkaline Phosphatase 99 38 - 126 U/L   Total Bilirubin  <0.2 (L) 0.3 - 1.2 mg/dL   GFR, Est Non Af Am >60 >60 mL/min   GFR, Est AFR Am >60 >60 mL/min   Anion gap 10 5 - 15    Comment: Performed at [facility], [address] [place], [place] [address]  CBC with Differential ([place] Only)     Status: Abnormal   Collection Time: 12/19/18  2:42 PM  Result Value Ref Range   WBC Count 11.5 (H) 4.0 - 10.5 K/uL   RBC 3.41 (L) 4.22 - 5.81 MIL/uL   Hemoglobin 7.0 (L) 13.0 - 17.0 g/dL    Comment: Reticulocyte Hemoglobin testing may be clinically indicated,  consider ordering this additional test [id number]    HCT 23.2 (L) 39.0 - 52.0 %   MCV 68.0 (L) 80.0 - 100.0 fL   MCH 20.5 (L) 26.0 - 34.0 pg   MCHC 30.2 30.0 - 36.0 g/dL   RDW 20.3 (H) 11.5 - 15.5 %   Platelet Count 585 (H) 150 - 400 K/uL   nRBC 0.0 0.0 - 0.2 %   Neutrophils Relative % 83 %   Neutro Abs 9.4 (H) 1.7 - 7.7 K/uL   Lymphocytes Relative 10 %   Lymphs Abs 1.2 0.7 - 4.0 K/uL   Monocytes Relative 7 %   Monocytes Absolute 0.8 0.1 - 1.0 K/uL   Eosinophils Relative 0 %   Eosinophils Absolute 0.1 0.0 - 0.5 K/uL   Basophils Relative 0 %   Basophils Absolute 0.0 0.0 - 0.1 K/uL   Immature Granulocytes 0 %   Abs Immature Granulocytes 0.04 0.00 - 0.07 K/uL    Comment: Performed at [facility], [address] [place], [place] [address]  Sample to Blood Bank     Status: None   Collection Time: 01/04/19 10:53 AM  Result Value Ref Range   Blood Bank Specimen SAMPLE AVAILABLE FOR TESTING    Sample Expiration      01/07/2019,2359 Performed at [facility], [place] [address] [place], [place] [address]   CMP ([place] only)     Status: Abnormal   Collection Time: 01/04/19 10:53 AM  Result Value Ref Range   Sodium 135 135 - 145 mmol/L   Potassium 3.7 3.5 - 5.1 mmol/L   Chloride 100 98 - 111 mmol/L   CO2 24 22 - 32 mmol/L   Glucose, Bld 177 (H) 70 - 99 mg/dL   BUN 10 6 - 20 mg/dL   Creatinine 1.00 0.61 -  1.24 mg/dL   Calcium 9.1 8.9 - 10.3 mg/dL   Total Protein 8.4 (H) 6.5 - 8.1 g/dL   Albumin 2.2 (L) 3.5 - 5.0 g/dL   AST 9 (L) 15 - 41 U/L   ALT 10 0 - 44 U/L   Alkaline Phosphatase 90 38 - 126 U/L   Total Bilirubin <0.2 (L) 0.3 - 1.2 mg/dL   GFR, Est Non Af Am >60 >60 mL/min   GFR, Est AFR Am >60 >60 mL/min   Anion gap 11 5 - 15    Comment: Performed at [facility], [place] [address] [place], [place] [address]  CBC with Differential ([place] Only)     Status: Abnormal   Collection Time: 01/04/19 10:53 AM  Result Value Ref Range   WBC Count 11.2 (H) 4.0 - 10.5 K/uL   RBC 3.37 (L) 4.22 - 5.81 MIL/uL   Hemoglobin 6.8 (LL) 13.0 - 17.0 g/dL    Comment: Reticulocyte Hemoglobin testing may be clinically indicated, consider ordering this additional test [id number] THIS CRITICAL RESULT HAS VERIFIED AND BEEN CALLED TO Narda Rutherford, MD BY JENNIFER MEARNS ON 11 20 2020 AT 1111, AND HAS BEEN READ BACK.     HCT 23.1 (L) 39.0 - 52.0 %   MCV 68.5 (L) 80.0 - 100.0 fL   MCH 20.2 (L) 26.0 - 34.0 pg   MCHC 29.4 (L) 30.0 - 36.0 g/dL   RDW 19.4 (H) 11.5 - 15.5 %   Platelet Count 673 (H) 150 - 400 K/uL   nRBC 0.0 0.0 - 0.2 %   Neutrophils Relative % 86 %   Neutro Abs 9.7 (  H) 1.7 - 7.7 K/uL   Lymphocytes Relative 10 %   Lymphs Abs 1.2 0.7 - 4.0 K/uL   Monocytes Relative 2 %   Monocytes Absolute 0.3 0.1 - 1.0 K/uL   Eosinophils Relative 1 %   Eosinophils Absolute 0.1 0.0 - 0.5 K/uL   Basophils Relative 0 %   Basophils Absolute 0.0 0.0 - 0.1 K/uL   Immature Granulocytes 1 %   Abs Immature Granulocytes 0.06 0.00 - 0.07 K/uL    Comment: Performed at [facility], [place] [address], [place], [place] [address]  Type and screen     Status: None   Collection Time: 01/04/19 10:53 AM  Result Value Ref Range   ABO/RH(D) O POS    Antibody Screen NEG    Sample Expiration 01/07/2019,2359    Unit Number [id number]    Blood Component Type RED CELLS,LR      Unit division 00    Status of Unit ISSUED,FINAL    Transfusion Status OK TO TRANSFUSE    Crossmatch Result Compatible    Unit Number [id number]    Blood Component Type RED CELLS,LR    Unit division 00    Status of Unit ISSUED,FINAL    Transfusion Status OK TO TRANSFUSE    Crossmatch Result      Compatible Performed at [facility], [place] [address], [place], [place] [address]   Prepare RBC     Status: None   Collection Time: 01/04/19 10:53 AM  Result Value Ref Range   Order Confirmation      ORDER PROCESSED BY BLOOD BANK Performed at [facility], [place] [address], [place], [place] [address]   ABO/Rh     Status: None   Collection Time: 01/04/19 10:53 AM  Result Value Ref Range   ABO/RH(D)      O POS Performed at [facility], [place] [address] [place], [place] [address]   BPAM RBC     Status: None   Collection Time: 01/04/19 10:53 AM  Result Value Ref Range   ISSUE DATE / TIME 786754492010    Blood Product Unit Number [id number]    PRODUCT CODE [id number]    Unit Type and Rh 5100    Blood Product Expiration Date 202012212359    ISSUE DATE / TIME 830940768088    Blood Product Unit Number [id number]    PRODUCT CODE [id number]    Unit Type and Rh 5100    Blood Product Expiration Date 381771165790   Ferritin     Status: None   Collection Time: 02/04/19  3:27 PM  Result Value Ref Range   Ferritin 184 24 - 336 ng/mL    Comment: Performed at [facility], [place] [address] [place], [place] [address]  Iron and TIBC     Status: Abnormal   Collection Time: 02/04/19  3:27 PM  Result Value Ref Range   Iron 8 (L) 42 - 163 ug/dL   TIBC 178 (L) 202 - 409 ug/dL   Saturation Ratios 4 (L) 20 - 55 %   UIBC 170 117 - 376 ug/dL    Comment: Performed at [facility], [place] [address] [place], [place] [address]  CMP ([place] only)     Status: Abnormal   Collection Time:  02/04/19  3:27 PM  Result Value Ref Range   Sodium 134 (L) 135 - 145 mmol/L   Potassium 4.0 3.5 - 5.1 mmol/L   Chloride  101 98 - 111 mmol/L   CO2 21 (L) 22 - 32 mmol/L   Glucose, Bld 97 70 - 99 mg/dL   BUN 9 6 - 20 mg/dL   Creatinine 0.78 0.61 - 1.24 mg/dL   Calcium 9.5 8.9 - 10.3 mg/dL   Total Protein 8.7 (H) 6.5 - 8.1 g/dL   Albumin 2.3 (L) 3.5 - 5.0 g/dL   AST 16 15 - 41 U/L   ALT 18 0 - 44 U/L   Alkaline Phosphatase 102 38 - 126 U/L   Total Bilirubin 0.2 (L) 0.3 - 1.2 mg/dL   GFR, Est Non Af Am >60 >60 mL/min   GFR, Est AFR Am >60 >60 mL/min   Anion gap 12 5 - 15    Comment: Performed at [facility], [place] [address] [place], [place] [address]  CBC with Differential ([place] Only)     Status: Abnormal   Collection Time: 02/04/19  3:27 PM  Result Value Ref Range   WBC Count 13.9 (H) 4.0 - 10.5 K/uL   RBC 3.77 (L) 4.22 - 5.81 MIL/uL   Hemoglobin 7.9 (L) 13.0 - 17.0 g/dL    Comment: Reticulocyte Hemoglobin testing may be clinically indicated, consider ordering this additional test [id number]    HCT 26.8 (L) 39.0 - 52.0 %   MCV 71.1 (L) 80.0 - 100.0 fL   MCH 21.0 (L) 26.0 - 34.0 pg   MCHC 29.5 (L) 30.0 - 36.0 g/dL   RDW 22.1 (H) 11.5 - 15.5 %   Platelet Count 578 (H) 150 - 400 K/uL   nRBC 0.0 0.0 - 0.2 %   Neutrophils Relative % 79 %   Neutro Abs 10.9 (H) 1.7 - 7.7 K/uL   Lymphocytes Relative 12 %   Lymphs Abs 1.7 0.7 - 4.0 K/uL   Monocytes Relative 8 %   Monocytes Absolute 1.1 (H) 0.1 - 1.0 K/uL   Eosinophils Relative 1 %   Eosinophils Absolute 0.2 0.0 - 0.5 K/uL   Basophils Relative 0 %   Basophils Absolute 0.1 0.0 - 0.1 K/uL   Immature Granulocytes 0 %   Abs Immature Granulocytes 0.05 0.00 - 0.07 K/uL    Comment: Performed at [facility], [address] [place], [place] [address]  ABO/Rh     Status: None   Collection Time: 02/04/19  3:27 PM  Result Value Ref Range   ABO/RH(D)      O POS Performed at  [facility], [place] [address] [place], [place] [address]   Comprehensive metabolic panel     Status: Abnormal   Collection Time: 02/04/19  5:29 PM  Result Value Ref Range   Sodium 137 135 - 145 mmol/L   Potassium 4.2 3.5 - 5.1 mmol/L   Chloride 102 98 - 111 mmol/L   CO2 26 22 - 32 mmol/L   Glucose, Bld 98 70 - 99 mg/dL   BUN 10 6 - 20 mg/dL   Creatinine, Ser 0.86 0.61 - 1.24 mg/dL   Calcium 10.2 8.9 - 10.3 mg/dL   Total Protein 8.5 (H) 6.5 - 8.1 g/dL   Albumin 2.6 (L) 3.5 - 5.0 g/dL   AST 15 15 - 41 U/L   ALT 19 0 - 44 U/L   Alkaline Phosphatase 107 38 - 126 U/L   Total Bilirubin 0.6 0.3 - 1.2 mg/dL   GFR calc non Af Amer >60 >60 mL/min   GFR calc Af Amer >60 >60 mL/min  Anion gap 9 5 - 15    Comment: Performed at [facility], [place] [address] [place], [place] [address]  Lactic acid, plasma     Status: None   Collection Time: 02/04/19  5:29 PM  Result Value Ref Range   Lactic Acid, Venous 1.8 0.5 - 1.9 mmol/L    Comment: Performed at [facility], [place] [address] [place], [place] [address]  CBC with Differential     Status: Abnormal   Collection Time: 02/04/19  5:29 PM  Result Value Ref Range   WBC 14.1 (H) 4.0 - 10.5 K/uL   RBC 3.83 (L) 4.22 - 5.81 MIL/uL   Hemoglobin 8.1 (L) 13.0 - 17.0 g/dL    Comment: Reticulocyte Hemoglobin testing may be clinically indicated, consider ordering this additional test [id number]    HCT 27.3 (L) 39.0 - 52.0 %   MCV 71.3 (L) 80.0 - 100.0 fL   MCH 21.1 (L) 26.0 - 34.0 pg   MCHC 29.7 (L) 30.0 - 36.0 g/dL   RDW 21.8 (H) 11.5 - 15.5 %   Platelets 506 (H) 150 - 400 K/uL   nRBC 0.0 0.0 - 0.2 %   Neutrophils Relative % 80 %   Neutro Abs 11.2 (H) 1.7 - 7.7 K/uL   Lymphocytes Relative 12 %   Lymphs Abs 1.7 0.7 - 4.0 K/uL   Monocytes Relative 7 %   Monocytes Absolute 0.9 0.1 - 1.0 K/uL   Eosinophils Relative 1 %   Eosinophils Absolute 0.2 0.0 - 0.5 K/uL   Basophils Relative 0 %    Basophils Absolute 0.1 0.0 - 0.1 K/uL   Immature Granulocytes 0 %   Abs Immature Granulocytes 0.05 0.00 - 0.07 K/uL   Tear Drop Cells PRESENT    Polychromasia PRESENT    Target Cells PRESENT    Ovalocytes PRESENT     Comment: Performed at [facility], [place] [address] [place], [place] [address]  Protime-INR     Status: None   Collection Time: 02/04/19  5:29 PM  Result Value Ref Range   Prothrombin Time 14.7 11.4 - 15.2 seconds   INR 1.2 0.8 - 1.2    Comment: (NOTE) INR goal varies based on device and disease states. Performed at [facility], [place] [address] [place], [place] [address]   Culture, blood (Routine x 2)     Status: None (Preliminary result)   Collection Time: 02/04/19  5:29 PM   Specimen: BLOOD  Result Value Ref Range   Specimen Description      BLOOD LEFT ANTECUBITAL Performed at [facility], [place] [address], [place], [place] [address]    Special Requests      BOTTLES DRAWN AEROBIC AND ANAEROBIC Blood Culture adequate volume Performed at [place] [address] [place], [place] [address]    Culture      NO GROWTH < 12 HOURS Performed at [place] [address], [place], [place] [address]    Report Status PENDING   Culture, blood (Routine x 2)     Status: None (Preliminary result)   Collection Time: 02/04/19  5:29 PM   Specimen: BLOOD  Result Value Ref Range   Specimen Description      BLOOD RIGHT WRIST Performed at [place] [address] [place], [place] [address]    Special Requests      BOTTLES DRAWN AEROBIC AND ANAEROBIC Blood Culture adequate volume Performed  at [facility], [place] [address], [place], [place] [address]    Culture      NO GROWTH < 12 HOURS Performed at [place] [address] [place], [place] [address]    Report Status PENDING   APTT     Status: None   Collection Time: 02/04/19  5:29 PM  Result  Value Ref Range   aPTT 36 24 - 36 seconds    Comment: Performed at [facility], [place] [address] [place], [place] [address]  Type and screen [place]     Status: None (Preliminary result)   Collection Time: 02/04/19  5:29 PM  Result Value Ref Range   ABO/RH(D) O POS    Antibody Screen NEG    Sample Expiration 02/07/2019,2359    Unit Number [id number]    Blood Component Type RED CELLS,LR    Unit division 00    Status of Unit ISSUED    Transfusion Status OK TO TRANSFUSE    Crossmatch Result      Compatible Performed at [facility] [place] [address], [place], [place] [address]   BPAM RBC     Status: None (Preliminary result)   Collection Time: 02/04/19  5:29 PM  Result Value Ref Range   ISSUE DATE / TIME [id number]    Blood Product Unit Number [id number]    PRODUCT CODE [id number]    Unit Type and Rh 5100    Blood Product Expiration Date 202101222359   POC SARS Coronavirus 2 Ag-ED - Nasal Swab (BD Veritor Kit)     Status: None   Collection Time: 02/04/19  8:01 PM  Result Value Ref Range   SARS Coronavirus 2 Ag NEGATIVE NEGATIVE    Comment: (NOTE) SARS-CoV-2 antigen NOT DETECTED.  Negative results are presumptive.  Negative results do not preclude SARS-CoV-2 infection and should not be used as the sole basis for treatment or other patient management decisions, including infection  control decisions, particularly in the presence of clinical signs and  symptoms consistent with COVID-19, or in those who have been in contact with the virus.  Negative results must be combined with clinical observations, patient history, and epidemiological information. The expected result is Negative. Fact Sheet for Patients: PodPark.tn Fact Sheet for Healthcare Providers: GiftContent.is This test is not yet approved or cleared by the [place] FDA and  has been authorized  for detection and/or diagnosis of SARS-CoV-2 by FDA under an Emergency Use Authorization (EUA).  This EUA will remain in effect (meaning this test can be used) for the duration of  the COVID-19 de claration under Section 564(b)(1) of the Act, 21 U.S.C. section 360bbb-3(b)(1), unless the authorization is terminated or revoked sooner.   Urinalysis, Routine w reflex microscopic     Status: Abnormal   Collection Time: 02/04/19  8:30 PM  Result Value Ref Range   Color, Urine YELLOW YELLOW   APPearance TURBID (A) CLEAR   Specific Gravity, Urine 1.020 1.005 - 1.030   pH 6.0 5.0 - 8.0   Glucose, UA NEGATIVE NEGATIVE mg/dL   Hgb urine dipstick MODERATE (A) NEGATIVE   Bilirubin Urine NEGATIVE NEGATIVE   Ketones, ur NEGATIVE NEGATIVE mg/dL   Protein, ur >=300 (A) NEGATIVE mg/dL   Nitrite NEGATIVE NEGATIVE   Leukocytes,Ua LARGE (A) NEGATIVE   RBC / HPF >50 (H) 0 - 5 RBC/hpf   WBC, UA >50 (H) 0 - 5 WBC/hpf   Bacteria, UA MANY (A) NONE SEEN  Mucus PRESENT     Comment: Performed at [facility], [place] [address] [place], [place] [address]  SARS CORONAVIRUS 2 (TAT 6-24 HRS) Nasopharyngeal Nasopharyngeal Swab     Status: None   Collection Time: 02/04/19  9:36 PM   Specimen: Nasopharyngeal Swab  Result Value Ref Range   SARS Coronavirus 2 NEGATIVE NEGATIVE    Comment: (NOTE) SARS-CoV-2 target nucleic acids are NOT DETECTED. The SARS-CoV-2 RNA is generally detectable in upper and lower respiratory specimens during the acute phase of infection. Negative results do not preclude SARS-CoV-2 infection, do not rule out co-infections with other pathogens, and should not be used as the sole basis for treatment or other patient management decisions. Negative results must be combined with clinical observations, patient history, and epidemiological information. The expected result is Negative. Fact Sheet for Patients: SugarRoll.be Fact Sheet for Healthcare  Providers: https://www.woods-mathews.com/ This test is not yet approved or cleared by the [place] FDA and  has been authorized for detection and/or diagnosis of SARS-CoV-2 by FDA under an Emergency Use Authorization (EUA). This EUA will remain  in effect (meaning this test can be used) for the duration of the COVID-19 declaration under Section 56 4(b)(1) of the Act, 21 U.S.C. section 360bbb-3(b)(1), unless the authorization is terminated or revoked sooner. Performed at [facility], [place] [address], [place], [place] [address]   CBC with Differential     Status: Abnormal   Collection Time: 02/05/19  4:55 AM  Result Value Ref Range   WBC 10.6 (H) 4.0 - 10.5 K/uL   RBC 3.08 (L) 4.22 - 5.81 MIL/uL   Hemoglobin 6.6 (LL) 13.0 - 17.0 g/dL    Comment: REPEATED TO VERIFY Reticulocyte Hemoglobin testing may be clinically indicated, consider ordering this additional test [id number] THIS CRITICAL RESULT HAS VERIFIED AND BEEN CALLED TO HODGES,I BY POTEAT,SHANNON ON 12 22 2020 AT 0516, AND HAS BEEN READ BACK. CRITICAL RESULT VERIFIED    HCT 22.2 (L) 39.0 - 52.0 %   MCV 72.1 (L) 80.0 - 100.0 fL   MCH 21.4 (L) 26.0 - 34.0 pg   MCHC 29.7 (L) 30.0 - 36.0 g/dL   RDW 21.8 (H) 11.5 - 15.5 %   Platelets 430 (H) 150 - 400 K/uL   nRBC 0.0 0.0 - 0.2 %   Neutrophils Relative % 75 %   Neutro Abs 7.9 (H) 1.7 - 7.7 K/uL   Lymphocytes Relative 14 %   Lymphs Abs 1.5 0.7 - 4.0 K/uL   Monocytes Relative 8 %   Monocytes Absolute 0.8 0.1 - 1.0 K/uL   Eosinophils Relative 3 %   Eosinophils Absolute 0.3 0.0 - 0.5 K/uL   Basophils Relative 0 %   Basophils Absolute 0.0 0.0 - 0.1 K/uL   Immature Granulocytes 0 %   Abs Immature Granulocytes 0.04 0.00 - 0.07 K/uL   Polychromasia PRESENT    Target Cells PRESENT    Ovalocytes PRESENT     Comment: Performed at [facility], [place] [address] [place],  [address]  Comprehensive metabolic panel     Status: Abnormal    Collection Time: 02/05/19  4:55 AM  Result Value Ref Range   Sodium 134 (L) 135 - 145 mmol/L   Potassium 4.3 3.5 - 5.1 mmol/L   Chloride 105 98 - 111 mmol/L   CO2 24 22 - 32 mmol/L   Glucose, Bld 109 (H) 70 - 99 mg/dL   BUN 9 6 - 20 mg/dL   Creatinine,  Ser 0.75 0.61 - 1.24 mg/dL   Calcium 8.9 8.9 - 10.3 mg/dL   Total Protein 6.9 6.5 - 8.1 g/dL   Albumin 2.0 (L) 3.5 - 5.0 g/dL   AST 10 (L) 15 - 41 U/L   ALT 15 0 - 44 U/L   Alkaline Phosphatase 70 38 - 126 U/L   Total Bilirubin 0.4 0.3 - 1.2 mg/dL   GFR calc non Af Amer >60 >60 mL/min   GFR calc Af Amer >60 >60 mL/min   Anion gap 5 5 - 15    Comment: Performed at [facility], [place] [address] [place], [place] [address]  Prepare RBC     Status: None   Collection Time: 02/05/19  5:24 AM  Result Value Ref Range   Order Confirmation      ORDER PROCESSED BY BLOOD BANK Performed at [facility], [place] [address], [place], [place] [address]   Lactic acid, plasma     Status: None   Collection Time: 02/05/19  7:34 AM  Result Value Ref Range   Lactic Acid, Venous 1.2 0.5 - 1.9 mmol/L    Comment: Performed at [facility], [place] [address], [place],  [address]    RADIOGRAPHIC STUDIES: None to review  ASSESSMENT & PLAN Nedim L Ballinger 48 y.o. male with medical history significant for non-Hodgkin lymphoma (treated in 1999 with chemo alone) who presents for evaluation of metastatic anal cancer. His course has been markedly complicated and handled at a multitude of different institutions.  He recently underwent a document colostomy on 01/17/2019 at [facility].  He was discharged with a functioning ostomy and suprapubic catheter.  The patient is now admitted with sepsis-UTI and acutely infected chronic nonhealing anal/sacral wound are possible sources.  He has responded well to IV fluids and IV antibiotics.  His pain is currently well controlled with oxycodone.  He has been seen by  general surgery who does not recommend any surgical intervention.  PCCM is also following.  #Sepsis -UTI and chronic wound or possible sources --Appreciate assistance from hospitalist, general surgery and PCCM. --Continue IV hydration and IV antibiotics per primary team. --Await cultures and narrow antibiotics when possible. --Recommend wound care consult for his large, chronic nonhealing anal/sacral wound.  #Metastatic Anal Cancer --will require new PET CT as restaging. Plan for this to occur in January once the acute inflammation from his surgery decreases. CT Chest and MRI pelvis are available from [facility].  -- will optimize patient for chemotherapy treatment by replenishing his iron stores and increasing his blood counts prior to starting treatment.  --We will plan for follow-up as an outpatient to re-evaluate for cancer treatment. In the interim we should get the microsatellite stability and PD-L1 status from the sample of tumor collected during his procedure at [facility].  --Recommend PT evaluation and dietitian consult while inpatient.  #Iron Deficiency Anemia Secondary to Blood Loss --patient received 1 unit of PRBC at [facility] (per patient report). He notes ongoing bleeding from his backside today.  He is receiving 1 additional unit PRBCs today. --Repeat iron panel shows a ferritin of 184, low iron at 8, percent saturation 4%. Will arrange for patient to receive IV iron in the outpatient setting. --His ferritin is elevated at 184, likely falsely elevated in the setting of inflammation from the wound.   --patient will require transfusion if Hgb drops below 7.0.  We will continue to monitor this closely during his hospitalization.  All questions were answered.  The patient knows to call the clinic with any problems, questions or concerns.  Mikey Bussing, DNP, AGPCNP-BC, AOCNP   02/05/2019 11:18 AM

## 2019-02-05 NOTE — ED Notes (Signed)
Pt provided food tray

## 2019-02-05 NOTE — Consult Note (Signed)
NAME:  Dylan [name], MRN:  [id number], DOB:  09-16-1970, LOS: 1 ADMISSION DATE:  02/04/2019, CONSULTATION DATE:  12/22 REFERRING MD: Lonn Georgia  , CHIEF COMPLAINT:  Sepsis    Brief History   48 year old w/ metastatic anal cancer and non-healing anal/sacral wound (has diverting colostomy and SP cath but yet to have chemo) admitted 12/22 w/ sepsis.   History of present illness   48 year old male patient with advanced metastatic anal cancer he is status post recent diverting colostomy and suprapubic catheter on 12/2 for nonhealing sacral wound and ongoing rectourethral fistula.  He is yet to start any chemotherapy, has had persistent weight loss weakness and physical decline over the last years time.  Presented to the emergency room on 12/21 under the direction of hematology/oncology as upon arrival he was found to be anemic, tachycardic , And hypotensive.  In the emergency room he was found to be hypotensive with blood pressure in the 70s to 80s, tachycardic, and complained of 9 out of 10 sacral pain.  He denied fever chills headache nausea vomiting dizziness shortness of breath chest pain wheezing abdominal pain his primary complaint has been progressive weakness particularly over the last week's time with difficulty going from the sitting to standing position and also overall loss of lower extremity strength.  He was pale on arrival initial evaluation showed hemoglobin of 7.9, normal lactic acid, and unremarkable blood chemistry.  He had a foul-smelling sacral wound which was large and tunneling, and chronic suprapubic catheter with cloudy colored urine cultures were obtained, 30 mL/kg predicted body weight IV crystalloid infused, and broad-spectrum antibiotics were initiated in spite of this his mean arterial pressure remained less than 60 with systolic pressure hovering in the low 80s.  Because of this critical care was asked to evaluate  Past Medical History  Suprapubic catheter, diverting colostomy  December 2020 for  recto urethral fistula.  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  Hodgkin's lymphoma ->marked decline post-op. Yet to start therapy.  Significant Hospital Events   12/21 admitted with working diagnosis of sepsis, cultures obtained IV fluids administered broad-spectrum antibiotics initiated 12/22 hemoglobin dropped from 7.9-6.6, transfused 1 unit, remained slightly hypotensive with systolic blood pressure in the low 80s MAP less than 60 although lactic acid remained normal and no new renal insults.  Critical care asked to see for hypotension  Consults:  Critical care consulted 12/22 Surgery consulted 12/22   Procedures:    Significant Diagnostic Tests:  CT abdomen pelvis 12/21: Suprapubic catheter with moderate to marked diffuse low-density bladder wall thickening and soft tissue stranding.  Interval increase in size of soft tissue mass in the inferior aspect of the large cavity of the perineal region No significant change in the additional mass in the superior aspect of the large cavity.  Interval multiple small nodules in the right lower lobe and right middle lobe.  Probable right common and external iliac metastasis/adenopathy.  Curvilinear density at L3 vertebrae moderate size right hydrocele  Micro Data:  COVID-19 12/21: Negative Blood cultures x2  12/21:  Antimicrobials:  Cefepime 12/21>>> Flagyl 12/21>> Vancomycin 12/21>>>   Interim history/subjective:  Feeling better  Objective   Blood pressure (Abnormal) 86/53, pulse 84, temperature 97.8 F (36.6 C), temperature source Oral, resp. rate 18, height 6\' 1"  (1.854 m), weight 57.6 kg, SpO2 100 %.       No intake or output data in the 24 hours ending 02/05/19 1011  Filed Weights   02/04/19 2136  Weight: 57.6 kg    Examination: General: Chronically ill-appearing 48 year old black male he is lying in bed in no acute distress currently  HENT: Temporal wasting mucous membranes moist but pale no jugular venous distention appreciated Lungs: Clear no accessory use room air Cardiovascular: Regular rate and rhythm Abdomen: Colostomy unremarkable.  Soft.  Suprapubic catheter unremarkable. Extremities: Warm dry no edema Neuro: Awake oriented Skin/Derm.  Large cratering sacral/anal wound, foul-smelling Resolved Hospital Problem list     Assessment & Plan:   Severe sepsis/septic shock in the setting of probable acute cystitis/urinary tract infection, and also consider acutely infected chronic non-healing anal/sacral wound -Lactic acid normal, no endorgan dysfunction both of these are reassuring.  He notes his normal systolic for him in mid 0000000 to 110. Plan Complete current blood transfusion Continue IV fluids Mean arterial pressure goal greater than 60 or systolic blood pressure greater than 95, I think we can hold off on vasoactive drips at this point Follow-up pending cultures Continue current antibiotics We will send cortisol Agree with admitting to the intensive care Agree with surgical consultation  Anemia.  No evidence of bleeding.  Suspect this is more of bone marrow suppression in the setting of his cancer Plan Holding anticoagulation Repeat CBC following transfusion  Metastatic anal cancer with metastasis to peritoneal cavity, lungs, and probably bone Plan Will need oncology evaluation although I think he is rapidly losing any chances of therapeutic intervention in regards to his cancer    Best practice:  Diet: reg Pain/Anxiety/Delirium protocol (if indicated): NA VAP protocol (if indicated): NA DVT prophylaxis: scd GI prophylaxis: PPI Glucose control: NA Mobility: BR Code Status: full code Family Communication: pending  Disposition: admit to ICU   Labs   CBC: Recent Labs  Lab 02/04/19 1527 02/04/19 1729 02/05/19 0455  WBC 13.9* 14.1* 10.6*  NEUTROABS 10.9* 11.2* 7.9*  HGB 7.9* 8.1* 6.6*  HCT  26.8* 27.3* 22.2*  MCV 71.1* 71.3* 72.1*  PLT 578* 506* 430*    Basic Metabolic Panel: Recent Labs  Lab 02/04/19 1527 02/04/19 1729 02/05/19 0455  NA 134* 137 134*  K 4.0 4.2 4.3  CL 101 102 105  CO2 21* 26 24  GLUCOSE 97 98 109*  BUN 9 10 9   CREATININE 0.78 0.86 0.75  CALCIUM 9.5 10.2 8.9   GFR: Estimated Creatinine Clearance: 90 mL/min (by C-G formula based on SCr of 0.75 mg/dL). Recent Labs  Lab 02/04/19 1527 02/04/19 1729 02/05/19 0455 02/05/19 0734  WBC 13.9* 14.1* 10.6*  --   LATICACIDVEN  --  1.8  --  1.2    Liver Function Tests: Recent Labs  Lab 02/04/19 1527 02/04/19 1729 02/05/19 0455  AST 16 15 10*  ALT 18 19 15   ALKPHOS 102 107 70  BILITOT 0.2* 0.6 0.4  PROT 8.7* 8.5* 6.9  ALBUMIN 2.3* 2.6* 2.0*   No results for input(s): LIPASE, AMYLASE in the last 168 hours. No results for input(s): AMMONIA in the last 168 hours.  ABG    Component Value Date/Time   TCO2 28 03/09/2010 2218     Coagulation Profile: Recent Labs  Lab 02/04/19 1729  INR 1.2    Cardiac Enzymes: No results for input(s): CKTOTAL, CKMB, CKMBINDEX, TROPONINI in the last 168 hours.  HbA1C: No results found for: HGBA1C  CBG: No results for input(s): GLUCAP in the last 168 hours.  Review of Systems:    Review of Systems  Constitutional: Positive for malaise/fatigue and weight  loss.  HENT: Negative.   Eyes: Negative.   Respiratory: Negative.   Cardiovascular: Negative.   Gastrointestinal: Negative.   Genitourinary: Negative.   Musculoskeletal: Positive for joint pain.  Skin: Negative.   Neurological: Negative.   Endo/Heme/Allergies: Negative.   Psychiatric/Behavioral: Negative.     Past Medical History  He,  has a past medical history of Cancer ([place]) (2018) and S/P radiation therapy (07/07/14-7/20/216).   Surgical History   History reviewed. No pertinent surgical history.   Social History   reports that he has been smoking cigarettes. He has been smoking  about 0.30 packs per day. He uses smokeless tobacco. He reports that he does not drink alcohol.   Family History   His family history includes Stroke in his mother.   Allergies No Known Allergies   Home Medications  Prior to Admission medications   Medication Sig Start Date End Date Taking? Authorizing Provider  acetaminophen (TYLENOL) 325 MG tablet Take 650 mg by mouth every 6 (six) hours as needed for pain. 11/21/18  Yes [provider]  enoxaparin (LOVENOX) 40 MG/0.4ML injection Inject 40 mg into the skin daily. 01/19/19  Yes [provider]  feeding supplement, ENSURE ENLIVE, (ENSURE ENLIVE) LIQD Take 237 mLs by mouth 2 (two) times daily between meals. 10/21/18  Yes Alma Friendly, MD  ferrous sulfate 325 (65 FE) MG tablet Take 1 tablet (325 mg total) by mouth daily with breakfast. 10/22/18 02/04/19 Yes Alma Friendly, MD  gabapentin (NEURONTIN) 100 MG capsule Take 100 mg by mouth at bedtime as needed for sleep. 01/02/19  Yes [provider]  MELATONIN PO Take 1 capsule by mouth at bedtime as needed (sleep).    Yes [provider]  metroNIDAZOLE (METROGEL) 0.75 % gel Apply 1 application topically daily. 11/21/18  Yes [provider]  Oxycodone HCl 20 MG TABS Take 20-30 mg by mouth See admin instructions. Take 1 tablet in the morning, take 1.5 tablets at lunch, and take 1 tablet at night 01/09/19  Yes [provider]  senna-docusate (SENOKOT-S) 8.6-50 MG tablet Take 1 tablet by mouth daily as needed for mild constipation.  11/21/18 03/01/19 Yes [provider]     Critical care time:  34 minutes.     Erick Colace ACNP-BC [place] Pager # [phone] OR # [phone] if no answer

## 2019-02-05 NOTE — ED Notes (Signed)
Date and time results received: 02/05/19 5:22 AM  (use smartphrase ".now" to insert current time)  Test: HGB Critical Value: 6.6  Name of Provider Notified: Dr.Kim  Orders Received? Or Actions Taken?: Transfuse 1 unit PRBC

## 2019-02-05 NOTE — Progress Notes (Signed)
PROGRESS NOTE    Dylan [name]  [id number] DOB: [birth year] DOA: 02/04/2019 PCP: [name], MD   Brief Narrative:  Patient is a 48 year old male with history of metastatic anal cancer status post radiation, surgery who was referred to the emergency department by his oncologist for the evaluation of hypertension, infection of the anal cancer area, tachycardia.  Patient was found to be severely dehydrated, weak and was unable to ambulate.  No fever or chills at home but  complaint of night sweats and fatigue.  There was report of urine and feces coming out of his anal wound though he has colostomy and suprapubic catheter. On presentation he was hypotensive, tachycardic.  Septic shock was suspected.  Patient was started on broad-spectrum antibiotics.  Source could be infected anal wound versus UTI due to fistula.  Blood pressure did not improve with IV fluids so PCCM consulted and he was moved to ICU.  Wound care and general surgery also consulted.  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  Afebrile.Source could be infected anal wound versus UTI due to fistula  Started on broad-spectrum antibiotics.  Culture sent.  UA suggestive of UTI.  Patient moved to ICU for close monitoring.  Continue IV fluids.  Low threshold to start on pressors.  PCCM following.  Infected anal wound: Has necrotic, foul-smelling rectal/anal wound.  No fluid collection seen on CT scan.  General surgery consulted but as per them there is no in indication for surgical  intervention.  Recommended continued wound therapy and hydrotherapy. He had several surgeries done in the past in different hospitals. He is a status post colostomy and suprapubic catheter placement.  Metastatic anal cancer: Status post radiation therapy, surgery.  Status post diverting colostomy on 01/17/2019 at [facility].  Also had suprapubic catheter placement.  He was complaining of a stool and bloody discharge from the wound.  He has history of noncompliance.  Follows with oncology.  Oncology planning for new PET/CT for restaging.  Oncology planning for initiating chemotherapy as an outpatient.  Microcytic anemia: Continue to monitor H&H. Hb dropped to 6.6 and has been transfused with  PRBC.  Iron panel showed severe iron deficiency .  He will benefit from IV iron infusion but will hold for now due to current sepsis.  Goals of care /advanced metastatic cancer: I have requested for palliative care evaluation.          DVT prophylaxis:Lovenox Code Status: Full Family Communication: None present at the bedside Disposition Plan: Likely home when clinically stable   Consultants: General surgery, oncology, PCCM  Procedures: None  Antimicrobials:  Anti-infectives (From admission, onward)   Start     Dose/Rate Route Frequency Ordered Stop   02/05/19 0600  metroNIDAZOLE (FLAGYL) IVPB 500 mg     500 mg 100 mL/hr over 60 Minutes Intravenous Every 8 hours 02/05/19 0115     02/05/19 0400  vancomycin (VANCOCIN) IVPB 1000 mg/200 mL premix     1,000 mg 200 mL/hr over 60 Minutes Intravenous Every 12 hours  02/05/19 0121     02/05/19 0200  ceFEPIme (MAXIPIME) 2 g in sodium chloride 0.9 % 100 mL IVPB     2 g 200 mL/hr over 30 Minutes Intravenous Every 8 hours 02/04/19 2307     02/04/19 1715  ceFEPIme (MAXIPIME) 2 g in sodium chloride 0.9 % 100 mL IVPB     2 g 200 mL/hr over 30 Minutes Intravenous  Once 02/04/19 1702 02/04/19 2025   02/04/19 1715  metroNIDAZOLE (FLAGYL) IVPB  500 mg     500 mg 100 mL/hr over 60 Minutes Intravenous  Once 02/04/19 1702 02/04/19 2234   02/04/19 1715  vancomycin (VANCOCIN) IVPB 1000 mg/200 mL premix     1,000 mg 200 mL/hr over 60 Minutes Intravenous  Once 02/04/19 1702 02/04/19 2025      Subjective:  Patient seen and examined the bedside this morning.  He was hypotensive in the emergency department.  Afebrile.  Mentation was good and he was alert and oriented.  Complains of pain in his anal region.  Objective: Vitals:   02/05/19 1200 02/05/19 1230 02/05/19 1245 02/05/19 1300  BP: 91/61 97/63  91/60  Pulse: 81 73 90 89  Resp: 18   18  Temp:      TempSrc:      SpO2: 100% 100% 100% 100%  Weight:      Height:        Intake/Output Summary (Last 24 hours) at 02/05/2019 1339 Last data filed at 02/05/2019 1200 Gross per 24 hour  Intake 410 ml  Output --  Net 410 ml   Filed Weights   02/04/19 2136  Weight: 57.6 kg    Examination:  General exam: Deconditioned, debilitated, generalized weakness HEENT:PERRL,Oral mucosa moist, Ear/Nose normal on gross exam Respiratory system: Bilateral equal air entry, normal vesicular breath sounds, no wheezes or crackles  Cardiovascular system: S1 & S2 heard, RRR. No JVD, murmurs, rubs, gallops or clicks. No pedal edema. Gastrointestinal system: Abdomen is nondistended, soft and nontender. No organomegaly or masses felt. Normal bowel sounds heard.  Colostomy, suprapubic catheter Central nervous system: Alert and oriented. No focal neurological deficits. Extremities: No edema, no clubbing ,no cyanosis, distal peripheral pulses palpable. Skin: Infected anal  wound       Data Reviewed: I have personally reviewed following labs and imaging studies  CBC: Recent Labs  Lab 02/04/19 1527 02/04/19 1729 02/05/19 0455  WBC 13.9* 14.1* 10.6*  NEUTROABS 10.9* 11.2* 7.9*  HGB 7.9* 8.1* 6.6*  HCT 26.8* 27.3* 22.2*  MCV 71.1* 71.3* 72.1*  PLT 578* 506* [id number]*   Basic Metabolic  Panel: Recent Labs  Lab 02/04/19 1527 02/04/19 1729 02/05/19 0455  NA 134* 137 134*  K 4.0 4.2 4.3  CL 101 102 105  CO2 21* 26 24  GLUCOSE 97 98 109*  BUN 9 10 9   CREATININE 0.78 0.86 0.75  CALCIUM 9.5 10.2 8.9   GFR: Estimated Creatinine Clearance: 90 mL/min (by C-G formula based on SCr of 0.75 mg/dL). Liver Function Tests: Recent Labs  Lab 02/04/19 1527 02/04/19 1729 02/05/19 0455  AST 16 15 10*  ALT 18 19 15   ALKPHOS 102 107 70  BILITOT 0.2* 0.6 0.4  PROT 8.7* 8.5* 6.9  ALBUMIN 2.3* 2.6* 2.0*   No results for input(s): LIPASE, AMYLASE in the last 168 hours. No results for input(s): AMMONIA in the last 168 hours. Coagulation Profile: Recent Labs  Lab 02/04/19 1729  INR 1.2   Cardiac Enzymes: No results for input(s): CKTOTAL, CKMB, CKMBINDEX,  TROPONINI in the last 168 hours. BNP (last 3 results) No results for input(s): PROBNP in the last 8760 hours. HbA1C: No results for input(s): HGBA1C in the last 72 hours. CBG: No results for input(s): GLUCAP in the last 168 hours. Lipid Profile: No results for input(s): CHOL, HDL, LDLCALC, TRIG, CHOLHDL, LDLDIRECT in the last 72 hours. Thyroid Function Tests: No results for input(s): TSH, T4TOTAL, FREET4, T3FREE, THYROIDAB in the last 72 hours. Anemia Panel: Recent Labs    02/04/19 1527  FERRITIN 184  TIBC 178*  IRON 8*   Sepsis Labs: Recent Labs  Lab 02/04/19 1729 02/05/19 0734  LATICACIDVEN 1.8 1.2    Recent Results (from the past 240 hour(s))  Culture, blood (Routine x 2)     Status: None (Preliminary result)   Collection Time: 02/04/19  5:29 PM   Specimen: BLOOD  Result Value Ref Range Status   Specimen Description   Final    BLOOD LEFT ANTECUBITAL Performed at [facility], [place] [address], [place], [place] [address]    Special Requests   Final    BOTTLES DRAWN AEROBIC AND ANAEROBIC Blood Culture adequate volume Performed at [facility] [address], [place],  [place] [address]    Culture   Final    NO GROWTH < 12 HOURS Performed at [facility] [address], [place], [place] [address]    Report Status PENDING  Incomplete  Culture, blood (Routine x 2)     Status: None (Preliminary result)   Collection Time: 02/04/19  5:29 PM   Specimen: BLOOD  Result Value Ref Range Status   Specimen Description   Final    BLOOD RIGHT WRIST Performed at [facility] [address] [place], [place] [address]    Special Requests   Final    BOTTLES DRAWN AEROBIC AND ANAEROBIC Blood Culture adequate volume Performed at [facility] [address] [place], [place] [address]    Culture   Final    NO GROWTH < 12 HOURS Performed at [facility] [address], [place], [place] [address]    Report Status PENDING  Incomplete  SARS CORONAVIRUS 2 (TAT 6-24 HRS) Nasopharyngeal Nasopharyngeal Swab     Status: None   Collection Time: 02/04/19  9:36 PM   Specimen: Nasopharyngeal Swab  Result Value Ref Range Status   SARS Coronavirus 2 NEGATIVE NEGATIVE Final    Comment: (NOTE) SARS-CoV-2 target nucleic acids are NOT DETECTED. The SARS-CoV-2 RNA is generally detectable in upper and lower respiratory specimens during the acute phase of infection. Negative results do not preclude SARS-CoV-2 infection, do not rule out co-infections with other pathogens, and should not be used as the sole basis for treatment or other patient management decisions. Negative results must be combined with clinical observations, patient history, and epidemiological information. The expected result is Negative. Fact Sheet for Patients: SugarRoll.be Fact Sheet for Healthcare Providers: https://www.woods-mathews.com/ This test is not yet approved or cleared by the [place] FDA and  has been authorized for detection and/or diagnosis of SARS-CoV-2 by FDA under an Emergency Use Authorization (EUA).  This EUA will remain  in effect (meaning this test can be used) for the duration of the COVID-19 declaration under Section 56 4(b)(1) of the Act, 21 U.S.C. section 360bbb-3(b)(1), unless the authorization is terminated or revoked sooner. Performed at [facility], [place] [address] [place], [place] [address]  Radiology Studies: CT ABDOMEN PELVIS W CONTRAST  Result Date: 02/04/2019 CLINICAL DATA:  Hypotension and tachycardia. Severe rectal pain. Status post anal and scrotal cancer resection 3 weeks ago with a diverting ostomy. EXAM: CT ABDOMEN AND PELVIS WITH CONTRAST TECHNIQUE: Multidetector CT imaging of the abdomen and pelvis was performed using the standard protocol following bolus administration of intravenous contrast. CONTRAST:  132mL OMNIPAQUE IOHEXOL 300 MG/ML  SOLN COMPARISON:  10/18/2018. FINDINGS: Lower chest: Multiple interval small nodules in the right lower lobe and right middle lobe. The largest is in the right lower lobe, measuring 6 mm in maximum diameter on image number 13 series 4. Normal sized heart. Hepatobiliary: No focal liver abnormality is seen. No gallstones, gallbladder wall thickening, or biliary dilatation. Pancreas: Unremarkable. No pancreatic ductal dilatation or surrounding inflammatory changes. Spleen: Normal in size without focal abnormality. Adrenals/Urinary Tract: Interval suprapubic Foley catheter in the urinary bladder with interval moderate to marked diffuse low density bladder wall thickening, mucosal enhancement and surrounding soft tissue stranding. Unremarkable adrenal glands, kidneys and ureters. Stomach/Bowel: Unremarkable stomach, small bowel and colon with an interval left lower quadrant ostomy. Vascular/Lymphatic: Mild atheromatous arterial calcifications without aneurysm. The previously described mildly prominent, enhancing, ill-defined right external iliac and distal common iliac artery lymph nodes are again demonstrated. A proximal  right external iliac node has a short axis diameter of 6 mm on image number 59 series 2, previously 8 mm. Reproductive: Moderately enlarged and heterogeneous prostate gland with a mild increase in low density heterogeneity, contiguous with the thickened bladder wall posteriorly. This is also contiguous with a mass protruding into the previously demonstrated open cavity in the perineal region. Inferiorly, this mass measures 3.3 x 2.6 cm on image number 75 series 2. A moderate-sized right hydrocele has not changed significantly. Other: There is an interval increase in size of a soft tissue mass at the inferior aspect of the large cavity in the perineal region, at the level of the proximal right thigh. This mass measures 4.8 x 2.6 cm on image number 92 series 2, previously approximately 2.4 x 1.6 cm. Musculoskeletal: Stable ill-defined curvilinear density in the L3 vertebral body and patchy increased and decreased density in the right iliac bone. Multiple small bilateral femoral head cysts are again noted. IMPRESSION: 1. Interval suprapubic Foley catheter with interval moderate to marked diffuse low density bladder wall thickening, mucosal enhancement and surrounding soft tissue stranding, compatible with marked cystitis. 2. Interval increase in size of a soft tissue mass in the inferior aspect of the large cavity in the perineal region, at the level of the proximal right thigh, compatible with the patient's known malignancy. 3. No significant change in an additional mass in the superior aspect of the large cavity, compatible with additional known malignancy. 4. Interval multiple small nodules in the right lower lobe and right middle lobe, suspicious for metastases. 5. Mildly improved probable right common and external iliac metastatic adenopathy. 6. Stable ill-defined curvilinear density in the L3 vertebral body and patchy increased and decreased density in the right iliac bone. These could represent treated  metastases. 7. Stable moderate-sized right hydrocele. Electronically Signed   By: Claudie Revering M.D.   On: 02/04/2019 19:25   DG Chest Portable 1 View  Result Date: 02/04/2019 CLINICAL DATA:  Sepsis. Hypotension. Recently resected anal cancer. EXAM: PORTABLE CHEST 1 VIEW COMPARISON:  11/07/2018. FINDINGS: Two small, nodular densities are again projected over the right upper lung zone and are not within ribs. Otherwise, clear lungs. Normal sized heart.  Mild lower thoracic spine degenerative changes. IMPRESSION: 1. Stable small, right upper lobe nodules. Again, further evaluation with a chest CT with contrast is recommended. 2. No acute abnormality. Electronically Signed   By: Claudie Revering M.D.   On: 02/04/2019 18:11        Scheduled Meds: . enoxaparin (LOVENOX) injection  40 mg Subcutaneous Daily  . feeding supplement (ENSURE ENLIVE)  237 mL Oral BID BM  . ferrous sulfate  325 mg Oral Q breakfast  . metroNIDAZOLE  1 application Topical Daily   Continuous Infusions: . sodium chloride 100 mL/hr at 02/04/19 2334  . ceFEPime (MAXIPIME) IV Stopped (02/05/19 0442)  . metronidazole Stopped (02/05/19 [id number])  . vancomycin Stopped (02/05/19 [id number])     LOS: 1 day    Time spent: More than 50% of that time was spent in counseling and/or coordination of care.      Shelly Coss, MD [facility] Pager [phone]  If 7PM-7AM, please contact night-coverage [email or web address] Password [email or web address] 02/05/2019, 1:40 PM

## 2019-02-06 DIAGNOSIS — K610 Anal abscess: Secondary | ICD-10-CM

## 2019-02-06 DIAGNOSIS — R6521 Severe sepsis with septic shock: Secondary | ICD-10-CM

## 2019-02-06 DIAGNOSIS — I959 Hypotension, unspecified: Secondary | ICD-10-CM

## 2019-02-06 DIAGNOSIS — Z515 Encounter for palliative care: Secondary | ICD-10-CM

## 2019-02-06 DIAGNOSIS — N3000 Acute cystitis without hematuria: Secondary | ICD-10-CM

## 2019-02-06 DIAGNOSIS — R64 Cachexia: Secondary | ICD-10-CM

## 2019-02-06 DIAGNOSIS — E43 Unspecified severe protein-calorie malnutrition: Secondary | ICD-10-CM

## 2019-02-06 DIAGNOSIS — E2740 Unspecified adrenocortical insufficiency: Secondary | ICD-10-CM

## 2019-02-06 DIAGNOSIS — D509 Iron deficiency anemia, unspecified: Secondary | ICD-10-CM

## 2019-02-06 DIAGNOSIS — T8130XA Disruption of wound, unspecified, initial encounter: Secondary | ICD-10-CM

## 2019-02-06 DIAGNOSIS — G893 Neoplasm related pain (acute) (chronic): Secondary | ICD-10-CM

## 2019-02-06 DIAGNOSIS — K631 Perforation of intestine (nontraumatic): Secondary | ICD-10-CM

## 2019-02-06 DIAGNOSIS — Z7189 Other specified counseling: Secondary | ICD-10-CM

## 2019-02-06 LAB — BPAM RBC
Blood Product Expiration Date: 202101222359
ISSUE DATE / TIME: 202012220745
Unit Type and Rh: 5100

## 2019-02-06 LAB — COMPREHENSIVE METABOLIC PANEL
ALT: 12 U/L (ref 0–44)
AST: 11 U/L — ABNORMAL LOW (ref 15–41)
Albumin: 2.2 g/dL — ABNORMAL LOW (ref 3.5–5.0)
Alkaline Phosphatase: 69 U/L (ref 38–126)
Anion gap: 8 (ref 5–15)
BUN: 5 mg/dL — ABNORMAL LOW (ref 6–20)
CO2: 24 mmol/L (ref 22–32)
Calcium: 8.9 mg/dL (ref 8.9–10.3)
Chloride: 103 mmol/L (ref 98–111)
Creatinine, Ser: 0.68 mg/dL (ref 0.61–1.24)
GFR calc Af Amer: >60 mL/min (ref >60)
GFR calc non Af Amer: >60 mL/min (ref >60)
Glucose, Bld: 107 mg/dL — ABNORMAL HIGH (ref 70–99)
Potassium: 3.7 mmol/L (ref 3.5–5.1)
Sodium: 135 mmol/L (ref 135–145)
Total Bilirubin: 0.5 mg/dL (ref 0.3–1.2)
Total Protein: 7.2 g/dL (ref 6.5–8.1)

## 2019-02-06 LAB — TYPE AND SCREEN
ABO/RH(D): O POS
Antibody Screen: NEGATIVE
Unit division: 0

## 2019-02-06 MED ORDER — HYDROCORTISONE 10 MG PO TABS: 10.0000 mg | ORAL_TABLET | Freq: Every morning | ORAL | Status: DC

## 2019-02-06 MED ORDER — FLUDROCORTISONE ACETATE 0.1 MG PO TABS: 0.0500 mg | ORAL_TABLET | Freq: Every day | ORAL | Status: DC

## 2019-02-06 MED ORDER — JUVEN PO PACK
1.0000 | PACK | Freq: Two times a day (BID) | ORAL | Status: DC
  Administered 2019-02-06 – 2019-02-08 (×4): 1 via ORAL
  Filled 2019-02-06 (×7): qty 1

## 2019-02-06 MED ORDER — HYDROCORTISONE 5 MG PO TABS: 5.0000 mg | ORAL_TABLET | Freq: Every evening | ORAL | Status: DC

## 2019-02-06 MED ORDER — HYDROCORTISONE 10 MG PO TABS
10.0000 mg | ORAL_TABLET | Freq: Every morning | ORAL | Status: DC
  Filled 2019-02-06: qty 1

## 2019-02-06 MED ORDER — HYDROCORTISONE 5 MG PO TABS: 15.0000 mg | ORAL_TABLET | Freq: Every day | ORAL | Status: DC

## 2019-02-06 MED ORDER — ENSURE SURGERY PO LIQD
237.0000 mL | Freq: Two times a day (BID) | ORAL | Status: DC
  Administered 2019-02-06 – 2019-02-09 (×6): 237 mL via ORAL
  Filled 2019-02-06 (×7): qty 237

## 2019-02-06 MED ORDER — ADULT MULTIVITAMIN W/MINERALS CH
1.0000 | ORAL_TABLET | Freq: Every day | ORAL | Status: DC
  Administered 2019-02-06 – 2019-02-09 (×4): 1 via ORAL
  Filled 2019-02-06 (×4): qty 1

## 2019-02-06 MED ORDER — HYDROCORTISONE NA SUCCINATE PF 100 MG IJ SOLR
50.0000 mg | Freq: Four times a day (QID) | INTRAMUSCULAR | Status: DC
  Administered 2019-02-06 – 2019-02-08 (×9): 50 mg via INTRAVENOUS
  Filled 2019-02-06 (×8): qty 2

## 2019-02-06 MED ORDER — HYDROCORTISONE NA SUCCINATE PF 100 MG IJ SOLR: 50.0000 mg | Freq: Four times a day (QID) | INTRAMUSCULAR | Status: DC

## 2019-02-06 MED ORDER — MIDODRINE HCL 5 MG PO TABS
5.0000 mg | ORAL_TABLET | Freq: Three times a day (TID) | ORAL | Status: DC
  Administered 2019-02-06 – 2019-02-09 (×11): 5 mg via ORAL
  Filled 2019-02-06 (×12): qty 1

## 2019-02-06 NOTE — Consult Note (Signed)
[facility] Nurse ostomy consult note Stoma type/location: LLQ, end colostomy Stomal assessment/size: 1 1/2" round, slightly budded , pale Peristomal assessment: did not assess Treatment options for stomal/peristomal skin: will add 2" barrier ring to POC Output none in pouch at the time of my visit Ostomy pouching: 2pc. Fecal pouch.  [facility] ostomy nurse provided update to [facility] nursing team yesterday.  Red robinson support has been removed by surgeon prior to his DC from [employer] provided:  Discussed care with patient he and his daughter have been managing his ostomy at home. He is currently out of supplies. Was using convex 2pc at home however while inpatient I have provided 1pc convex (this is what our system has on formulary).  Discussed needs with patient at his dc and did he need support with ordering supplies.  Patient reports he has MCR and MCD.  Offered to [employer] for him to be prepared to order when he is back at home.  He reports his daughter is working on that and denies any other needs for ordering supplies.   Discussed POC with patient and bedside nurse.  Re consult if needed, will not follow at this time. Thanks  Demetria Lightsey [employer], RN,CWOCN, CNS, [place] [phone])

## 2019-02-06 NOTE — Progress Notes (Addendum)
Initial Nutrition Assessment  RD working remotely.   DOCUMENTATION CODES:   Underweight  INTERVENTION:  - will order Ensure Enlive BID, each supplement provides 350 kcal and 20 grams of protein. - will order Magic Cup TID with meals, each supplement provides 290 kcal and 9 grams of protein. - will order daily multivitamin with minerals. - will order Juven BID, each packet provides 95 calories, 2.5 grams of protein (collagen), and 9.8 grams of carbohydrate (3 grams sugar); also contains 7 grams of L-arginine and L-glutamine, 300 mg vitamin C, 15 mg vitamin E, 1.2 mcg vitamin B-12, 9.5 mg zinc, 200 mg calcium, and 1.5 g  Calcium Beta-hydroxy-Beta-methylbutyrate to support wound healing.    NUTRITION DIAGNOSIS:   Increased nutrient needs related to acute illness, chronic illness as evidenced by estimated needs.  GOAL:   Patient will meet greater than or equal to 90% of their needs  MONITOR:   PO intake, Supplement acceptance, Labs, Weight trends  REASON FOR ASSESSMENT:   Consult Assessment of nutrition requirement/status  ASSESSMENT:   48 year old male with history of metastatic anal cancer s/p radiation and surgery. He was referred to the ED by Oncologist for evaluation of HTN, infection of anal area, and tachycardia. In the ED, he was found to be severely dehydrated, weak, and unable to ambulate. It was reported that urine and feces was coming out of anal wound although he has a colostomy and suprapubic cath. Wound care and General Surgery were consulted.  No intakes documented since admission. Unsuccessful in reaching patient by phone at this time. Per chart review, current weight is 127 lb and weight on 01/04/19 was 135 lb. This indicates 8 lb weight loss (6% body weight) in the past 1 month; significant for time frame. Patient has been steadily losing weight for at least the past 3 months. Patient met criteria for moderate malnutrition in September; suspect this is ongoing or  has worsened to severe malnutrition.    Per notes: - severe sepsis/septic shock - infected anal wound--necrosis; several surgeries in the past at several health systems  - metastatic anal cancer - microcytic anemia   Labs reviewed; BUN: 5 mg/dl. Medications reviewed; 325 mg ferrous sulfate/day, 50 mg solu-cortef QID, 10 mg + 5 mg cortef/day. IVF; LR @ 100 ml/hr.     NUTRITION - FOCUSED PHYSICAL EXAM:  unable to complete at this time.   Diet Order:   Diet Order            Diet regular Room service appropriate? Yes; Fluid consistency: Thin  Diet effective now              EDUCATION NEEDS:   No education needs have been identified at this time  Skin:  Skin Assessment: Skin Integrity Issues: Skin Integrity Issues:: Other (Comment) Other: open sacral wound  Last BM:  12/23  Height:   Ht Readings from Last 1 Encounters:  02/04/19 6' 1"  (1.854 m)    Weight:   Wt Readings from Last 1 Encounters:  02/04/19 57.6 kg    Ideal Body Weight:  83.6 kg  BMI:  Body mass index is 16.76 kg/m.  Estimated Nutritional Needs:   Kcal:  2300-2500 kcal  Protein:  115-130 grams  Fluid:  >/= 2.8 L/day      Jarome Matin, MS, RD, LDN, [facility] Inpatient Clinical Dietitian Pager # [phone] After hours/weekend pager # [phone]

## 2019-02-06 NOTE — Progress Notes (Signed)
PROGRESS NOTE    Dylan [name]  [id number] DOB: [birth year] DOA: 02/04/2019 PCP: [name], MD   Brief Narrative:  Patient is a 48 year old male with history of metastatic anal cancer status post radiation, surgery who was referred to the emergency department by his oncologist for the evaluation of hypertension, infection of the anal cancer area, tachycardia.  Patient was found to be severely dehydrated, weak and was unable to ambulate.  No fever or chills at home but  complaint of night sweats and fatigue.  There was report of urine and feces coming out of his anal wound though he has colostomy and suprapubic catheter. On presentation he was hypotensive, tachycardic.  Septic shock was suspected.  Patient was started on broad-spectrum antibiotics.  Source could be infected anal wound versus UTI due to fistula.  Blood pressure did not improve with IV fluids so PCCM consulted and he was moved to ICU.  Wound care and general surgery also consulted.  12/23:Blood pressure is currently stable .  Palliative care was also following and patient remains full code and he is interested in continued management for his cancer.  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  Culture  sent.  UA suggestive of UTI.  Patient moved to ICU for close monitoring.  Continue IV fluids.   PCCM following. BP soft but acceptable today.  On as needed midodrine.  On Florinef and hydrocortisone. Plan is to change suprapubic catheter.  Infected anal wound: Has necrotic, foul-smelling rectal/anal wound.  No fluid collection seen on CT scan.  General surgery consulted but as per them there is no in indication for surgical intervention.  Recommended continued wound care therapy . He had several surgeries done in the past in different hospitals. He is a status post colostomy and suprapubic catheter placement.  Metastatic anal cancer: Status post radiation therapy, surgery.  Status post diverting colostomy on 01/17/2019 at [facility].  Also had suprapubic catheter placement.  He was complaining of a stool and bloody discharge from the wound.  He has history of noncompliance.  Follows with oncology.  Oncology planning for new PET/CT for restaging.  Oncology planning for initiating chemotherapy as an outpatient.  Microcytic anemia: Continue to monitor H&H. Hb dropped to 6.6 and has been transfused with  PRBC.  Iron panel showed severe iron deficiency .  He will benefit from IV iron infusion but will hold for now due to current sepsis.  Vtach: Had an episode of nonsustained V. tach.  Checking magnesium and potassium.  Goals of care /advanced metastatic cancer: Palliative care was following.  Remains full code         DVT prophylaxis:Lovenox Code Status: Full Family Communication: Discussed with patient himself about the management plan Disposition  Plan: Likely home when clinically stable.Will order PT/OT   Consultants: General surgery, oncology, PCCM  Procedures: None  Antimicrobials:  Anti-infectives (From admission, onward)   Start     Dose/Rate Route Frequency Ordered Stop   02/05/19 0600  metroNIDAZOLE (FLAGYL) IVPB 500 mg  Status:  Discontinued     500 mg 100 mL/hr  over 60 Minutes Intravenous Every 8 hours 02/05/19 0115 02/06/19 0900   02/05/19 0400  vancomycin (VANCOCIN) IVPB 1000 mg/200 mL premix     1,000 mg 200 mL/hr over 60 Minutes Intravenous Every 12 hours 02/05/19 0121     02/05/19 0200  ceFEPIme (MAXIPIME) 2 g in sodium chloride 0.9 % 100 mL IVPB     2 g 200 mL/hr over 30 Minutes Intravenous Every 8 hours 02/04/19 2307     02/04/19 1715  ceFEPIme (MAXIPIME) 2 g in sodium chloride 0.9 % 100 mL IVPB     2 g 200 mL/hr over 30 Minutes Intravenous  Once 02/04/19 1702 02/04/19 2025   02/04/19 1715  metroNIDAZOLE (FLAGYL) IVPB 500 mg     500 mg 100 mL/hr over 60 Minutes Intravenous  Once 02/04/19 1702 02/04/19 2234   02/04/19 1715  vancomycin (VANCOCIN) IVPB 1000 mg/200 mL premix     1,000 mg 200 mL/hr over 60 Minutes Intravenous  Once 02/04/19 1702 02/04/19 2025      Subjective:  Patient seen and examined the bedside this morning.  Hemodynamically stable.  Looks in good mood today.  Blood pressure has improved.  Afebrile.  Denies any particular complaints  Objective: Vitals:   02/06/19 0800 02/06/19 0900 02/06/19 1000 02/06/19 1100  BP: (!) 84/48 103/68 (!) 96/53 (!) 89/57  Pulse: 78 87 89 87  Resp: 15 15 19 12   Temp: 98.2 F (36.8 C)     TempSrc: Oral     SpO2: 100% 100% 100% 99%  Weight:      Height:        Intake/Output Summary (Last 24 hours) at 02/06/2019 1134 Last data filed at 02/06/2019 [id number] Gross per 24 hour  Intake 3217.28 ml  Output 1670 ml  Net 1547.28 ml   Filed Weights   02/04/19 2136  Weight: 57.6 kg    Examination:  General exam: Deconditioned, debilitated, generalized weakness HEENT:PERRL Ear/Nose normal on gross exam Respiratory system: Bilateral equal air entry, normal vesicular breath sounds, no wheezes or crackles  Cardiovascular system: S1 & S2 heard, RRR. No JVD, murmurs, rubs, gallops or clicks. No pedal edema. Gastrointestinal system: Abdomen is nondistended, soft and nontender. No organomegaly  or masses felt. Normal bowel sounds heard.  Colostomy, suprapubic catheter Central nervous system: Alert and oriented. No focal neurological deficits. Extremities: No edema, no clubbing ,no cyanosis, distal peripheral pulses palpable. Skin: Infected anal  wound       Data Reviewed: I have personally reviewed following labs and imaging studies  CBC: Recent Labs  Lab 02/04/19 1527 02/04/19 1729 02/05/19 0455 02/05/19 1804  WBC 13.9* 14.1* 10.6* 9.9  NEUTROABS 10.9* 11.2* 7.9*  --   HGB 7.9* 8.1* 6.6* 9.0*  HCT 26.8* 27.3* 22.2* 29.7*  MCV 71.1* 71.3* 72.1* 75.8*  PLT 578* 506* 430* [id number]*   Basic Metabolic Panel: Recent Labs  Lab 02/04/19 1527 02/04/19 1729 02/05/19 0455  NA 134* 137 134*  K 4.0 4.2 4.3  CL 101 102 105  CO2 21* 26 24  GLUCOSE 97 98 109*  BUN 9 10 9   CREATININE 0.78 0.86 0.75  CALCIUM 9.5 10.2 8.9  GFR: Estimated Creatinine Clearance: 90 mL/min (by C-G formula based on SCr of 0.75 mg/dL). Liver Function Tests: Recent Labs  Lab 02/04/19 1527 02/04/19 1729 02/05/19 0455  AST 16 15 10*  ALT 18 19 15   ALKPHOS 102 107 70  BILITOT 0.2* 0.6 0.4  PROT 8.7* 8.5* 6.9  ALBUMIN 2.3* 2.6* 2.0*   No results for input(s): LIPASE, AMYLASE in the last 168 hours. No results for input(s): AMMONIA in the last 168 hours. Coagulation Profile: Recent Labs  Lab 02/04/19 1729  INR 1.2   Cardiac Enzymes: No results for input(s): CKTOTAL, CKMB, CKMBINDEX, TROPONINI in the last 168 hours. BNP (last 3 results) No results for input(s): PROBNP in the last 8760 hours. HbA1C: No results for input(s): HGBA1C in the last 72 hours. CBG: No results for input(s): GLUCAP in the last 168 hours. Lipid Profile: No results for input(s): CHOL, HDL, LDLCALC, TRIG, CHOLHDL, LDLDIRECT in the last 72 hours. Thyroid Function Tests: No results for input(s): TSH, T4TOTAL, FREET4, T3FREE, THYROIDAB in the last 72 hours. Anemia Panel: Recent Labs    02/04/19 1527  FERRITIN 184   TIBC 178*  IRON 8*   Sepsis Labs: Recent Labs  Lab 02/04/19 1729 02/05/19 0734  LATICACIDVEN 1.8 1.2    Recent Results (from the past 240 hour(s))  Culture, blood (Routine x 2)     Status: None (Preliminary result)   Collection Time: 02/04/19  5:29 PM   Specimen: BLOOD  Result Value Ref Range Status   Specimen Description   Final    BLOOD LEFT ANTECUBITAL Performed at [facility], [place] [address] [place], [place] [address]    Special Requests   Final    BOTTLES DRAWN AEROBIC AND ANAEROBIC Blood Culture adequate volume Performed at [facility] [address] [place], [place] [address]    Culture   Final    NO GROWTH 2 DAYS Performed at [facility] [address], [place], [place] [address]    Report Status PENDING  Incomplete  Culture, blood (Routine x 2)     Status: None (Preliminary result)   Collection Time: 02/04/19  5:29 PM   Specimen: BLOOD  Result Value Ref Range Status   Specimen Description   Final    BLOOD RIGHT WRIST Performed at [facility] [address], [place], [place] [address]    Special Requests   Final    BOTTLES DRAWN AEROBIC AND ANAEROBIC Blood Culture adequate volume Performed at [facility] [address], [place], [place] [address]    Culture   Final    NO GROWTH 2 DAYS Performed at [facility] [address], [place], [place] [address]    Report Status PENDING  Incomplete  Urine culture     Status: Abnormal (Preliminary result)   Collection Time: 02/04/19  8:30 PM   Specimen: In/Out Cath Urine  Result Value Ref Range Status   Specimen Description   Final    IN/OUT CATH URINE Performed at [facility] [address], [place], [place] [address]    Special Requests   Final    NONE Performed at [facility], [place] [address] [place], [place] [address]    Culture MULTIPLE SPECIES PRESENT, SUGGEST RECOLLECTION (A)   Final   Report Status PENDING  Incomplete  SARS CORONAVIRUS 2 (TAT 6-24 HRS) Nasopharyngeal Nasopharyngeal Swab     Status: None   Collection Time: 02/04/19  9:36 PM   Specimen: Nasopharyngeal Swab  Result Value Ref Range Status   SARS Coronavirus 2 NEGATIVE NEGATIVE Final    Comment: (NOTE) SARS-CoV-2 target nucleic acids are NOT DETECTED. The SARS-CoV-2 RNA is generally detectable in upper and lower respiratory specimens during the acute phase of infection. Negative results do not preclude SARS-CoV-2 infection, do not rule out co-infections with other pathogens, and should not be used as the sole basis for treatment or other patient management decisions. Negative results must be combined with clinical observations, patient history, and epidemiological information. The expected result is Negative. Fact Sheet for Patients: SugarRoll.be Fact Sheet for Healthcare Providers: https://www.woods-mathews.com/ This test is not yet approved or cleared by the [place] FDA and  has been authorized for detection and/or diagnosis of SARS-CoV-2 by FDA under an Emergency Use Authorization (EUA). This EUA will remain  in effect (meaning this test can be used) for the duration of the COVID-19 declaration under Section 56 4(b)(1) of the Act, 21 U.S.C. section 360bbb-3(b)(1), unless the authorization is terminated or revoked sooner. Performed at [facility], [place] [address] [place], [place] [address]   MRSA PCR Screening     Status: None   Collection Time: 02/05/19  5:36 PM   Specimen: Nasal Mucosa; Nasopharyngeal  Result Value Ref Range Status   MRSA by PCR NEGATIVE NEGATIVE Final    Comment:        The GeneXpert MRSA Assay (FDA approved for NASAL specimens only), is one component of a comprehensive MRSA colonization surveillance program. It is not intended to diagnose MRSA infection nor to guide or monitor treatment for MRSA  infections. Performed at [facility], [place] [address] [place], [place] [address]          Radiology Studies: CT ABDOMEN PELVIS W CONTRAST  Result Date: 02/04/2019 CLINICAL DATA:  Hypotension and tachycardia. Severe rectal pain. Status post anal and scrotal cancer resection 3 weeks ago with a diverting ostomy. EXAM: CT ABDOMEN AND PELVIS WITH CONTRAST TECHNIQUE: Multidetector CT imaging of the abdomen and pelvis was performed using the standard protocol following bolus administration of intravenous contrast. CONTRAST:  160mL OMNIPAQUE IOHEXOL 300 MG/ML  SOLN COMPARISON:  10/18/2018. FINDINGS: Lower chest: Multiple interval small nodules in the right lower lobe and right middle lobe. The largest is in the right lower lobe, measuring 6 mm in maximum diameter on image number 13 series 4. Normal sized heart. Hepatobiliary: No focal liver abnormality is seen. No gallstones, gallbladder wall thickening, or biliary dilatation. Pancreas: Unremarkable. No pancreatic ductal dilatation or surrounding inflammatory changes. Spleen: Normal in size without focal abnormality. Adrenals/Urinary Tract: Interval suprapubic Foley catheter in the urinary bladder with interval moderate to marked diffuse low density bladder wall thickening, mucosal enhancement and surrounding soft tissue stranding. Unremarkable adrenal glands, kidneys and ureters. Stomach/Bowel: Unremarkable stomach, small bowel and colon with an interval left lower quadrant ostomy. Vascular/Lymphatic: Mild atheromatous arterial calcifications without aneurysm. The previously described mildly prominent, enhancing, ill-defined right external iliac and distal common iliac artery lymph nodes are again demonstrated. A proximal right external iliac node has a short axis diameter of 6 mm on image number 59 series 2, previously 8 mm. Reproductive: Moderately enlarged and heterogeneous prostate gland with a mild increase in low density  heterogeneity, contiguous with the thickened bladder wall posteriorly. This is also contiguous with a mass protruding into the previously demonstrated open cavity in the perineal region. Inferiorly, this mass measures 3.3 x 2.6 cm on image number  75 series 2. A moderate-sized right hydrocele has not changed significantly. Other: There is an interval increase in size of a soft tissue mass at the inferior aspect of the large cavity in the perineal region, at the level of the proximal right thigh. This mass measures 4.8 x 2.6 cm on image number 92 series 2, previously approximately 2.4 x 1.6 cm. Musculoskeletal: Stable ill-defined curvilinear density in the L3 vertebral body and patchy increased and decreased density in the right iliac bone. Multiple small bilateral femoral head cysts are again noted. IMPRESSION: 1. Interval suprapubic Foley catheter with interval moderate to marked diffuse low density bladder wall thickening, mucosal enhancement and surrounding soft tissue stranding, compatible with marked cystitis. 2. Interval increase in size of a soft tissue mass in the inferior aspect of the large cavity in the perineal region, at the level of the proximal right thigh, compatible with the patient's known malignancy. 3. No significant change in an additional mass in the superior aspect of the large cavity, compatible with additional known malignancy. 4. Interval multiple small nodules in the right lower lobe and right middle lobe, suspicious for metastases. 5. Mildly improved probable right common and external iliac metastatic adenopathy. 6. Stable ill-defined curvilinear density in the L3 vertebral body and patchy increased and decreased density in the right iliac bone. These could represent treated metastases. 7. Stable moderate-sized right hydrocele. Electronically Signed   By: Claudie Revering M.D.   On: 02/04/2019 19:25   DG Chest Portable 1 View  Result Date: 02/04/2019 CLINICAL DATA:  Sepsis. Hypotension.  Recently resected anal cancer. EXAM: PORTABLE CHEST 1 VIEW COMPARISON:  11/07/2018. FINDINGS: Two small, nodular densities are again projected over the right upper lung zone and are not within ribs. Otherwise, clear lungs. Normal sized heart. Mild lower thoracic spine degenerative changes. IMPRESSION: 1. Stable small, right upper lobe nodules. Again, further evaluation with a chest CT with contrast is recommended. 2. No acute abnormality. Electronically Signed   By: Claudie Revering M.D.   On: 02/04/2019 18:11        Scheduled Meds: . Chlorhexidine Gluconate Cloth  6 each Topical Daily  . enoxaparin (LOVENOX) injection  40 mg Subcutaneous Daily  . feeding supplement (ENSURE ENLIVE)  237 mL Oral BID BM  . feeding supplement  237 mL Oral BID BM  . ferrous sulfate  325 mg Oral Q breakfast  . [START ON 02/09/2019] fludrocortisone  0.05 mg Oral Daily  . [START ON 02/09/2019] hydrocortisone  10 mg Oral q morning - 10a  . [START ON 02/09/2019] hydrocortisone  5 mg Oral QPM  . hydrocortisone sod succinate (SOLU-CORTEF) inj  50 mg Intravenous Q6H  . metroNIDAZOLE  1 application Topical Daily  . midodrine  5 mg Oral TID WC   Continuous Infusions: . ceFEPime (MAXIPIME) IV Stopped (02/06/19 [id number])  . lactated ringers 100 mL/hr at 02/06/19 0927  . vancomycin Stopped (02/06/19 1027)     LOS: 2 days    Time spent: 25 mins,More than 50% of that time was spent in counseling and/or coordination of care.      Shelly Coss, MD [facility] Pager [phone]  If 7PM-7AM, please contact night-coverage [email or web address] Password [facility] 02/06/2019, 11:34 AM

## 2019-02-06 NOTE — Consult Note (Signed)
Consultation Note Date: 02/06/2019   Patient Name: [name]  DOB: 26-Jan-1971  MRN: [id number]  Age / Sex: 48 y.o., male   PCP: Dylan Agreste, MD Referring Physician: Shelly Coss, MD   REASON FOR CONSULTATION:Establishing goals of care  Palliative Care consult requested for goals of care in this 48 y.o. male with multiple medical problems including metastatic anal cancer s/p radiation (2016), Hodgkins Lymphoma (age 70), suprapubic catheter, and non-healing anal/sacral wound) with diverting colostomy (01/2019 at [facility]). Patient is also followed by Oncology at [facility] (Dr. Lorenso [name]). [name] was being seen at the [facility] and found to be hypotensive with rectal pain. During his ED work-up patient received 2L NS bolus and IV antibiotics. Urinalysis showed moderate hemoglobinuria, proteinuria, large leukocyte esterase, more than 50 RBC and more than 50 WBC with many bacteria. WBC 14.1, Hgb 8.1, albumin 2.6. Surgery team has evaluated patient with no plans for surgical interventions per notation.   Clinical Assessment and Goals of Care: I have reviewed medical records including lab results, imaging, Epic notes, and MAR, received report from the bedside RN, and assessed the patient. I met at the bedside with patient to discuss diagnosis prognosis, GOC, EOL wishes, disposition and options. Mr. [name] is awake, alert, and oriented x3. He complains of some rectal pain but reports current pain regimen is helpful.   I re-introduced Palliative Medicine as specialized medical care for people living with serious illness. It focuses on providing relief from the symptoms and stress of a serious illness. The goal is to improve quality of life for both the patient and the family. Mr. [name] verbalized appreciation of support. He is also being followed by Palliative at [facility].   We discussed a brief life review of the patient, along with his functional and nutritional status. He reports  he is originally from [name] and has 4 children. He lives with his daughter, [name]. He reports he has not worked much in his life as he has dealt with cancer since he was 48 years old being diagnosed with Hodgkins Lymphoma.   Prior to admission he reports being able to transfer for the bed with assistance. He reports he spends most of his time in bed. His appetite is ok. He endorses weight loss of about 60lbs over the past 8 months.   We discussed His current illness and what it means in the larger context of His on-going co-morbidities. With specific discussions regarding his metastatic anal cancer, non-healing anal/rectal mass, and overall functional state. Natural disease trajectory and expectations at EOL were discussed.  Mr. [name] understanding of his condition. He becomes quiet as he speaks on his health stating "my body is more tore up than I thought it was!" Support given.   I attempted to elicit values and goals of care important to the patient.    The difference between aggressive medical intervention and comfort care was considered in light of the patient's goals of care. Mr. [name] expresses his history over the years with cancer treatments such as radiation. He reports he was offered chemotherapy in the past however he declined on all occassions. He reports he is not able to receive any further radiation and feels his only option is chemotherapy at this point. He expresses his goals at this time is to continue with aggressive interventions and remains hopeful he can undergo chemotherapy treatment at some point. He is not interested in comfort care.   Advanced directives,  concepts specific to code status, artifical feeding and hydration, and rehospitalization were considered and discussed. Mr. [name] would be interested in all aggressive medical interventions that are recommended. Patient reports he does not have a documented advanced directive. He is not interested in  completing a document as all 4 of his children are aware of his health and he relies on them to support each other. He states they all are his decision makers and does not want to identify one over the other.   I discuss in details his current full code status with consideration to his current illness. Mr. [name] again confirms wishes for full aggressive medical interventions and expresses wishes for all heroic measures including CPR and intubation if required.   He is currently being followed by Palliative at [facility].   Questions and concerns were addressed. Patient encouraged to call with questions or concerns.  PMT will continue to support holistically.   SOCIAL HISTORY:     reports that he has been smoking cigarettes. He has been smoking about 0.30 packs per day. He uses smokeless tobacco. He reports that he does not drink alcohol.  CODE STATUS: Full code  ADVANCE DIRECTIVES: Next of Kin (Children)   SYMPTOM MANAGEMENT: per medical team   Palliative Prophylaxis:   Aspiration, Bowel Regimen, Eye Care, Frequent Pain Assessment, Oral Care, Palliative Wound Care and Turn Reposition  PSYCHO-SOCIAL/SPIRITUAL:  Support System: Family   Desire for further Chaplaincy support: NO   Additional Recommendations (Limitations, Scope, Preferences):  Full Scope Treatment   PAST MEDICAL HISTORY: Past Medical History:  Diagnosis Date  . Cancer ([place]) 2018   anal cancer  . Non Hodgkin's lymphoma ([place]) 1999  . S/P radiation therapy 07/07/14-7/20/216   anal ca     PAST SURGICAL HISTORY:  Past Surgical History:  Procedure Laterality Date  . COLOSTOMY  2020   diverting colostomy at [facility]  . ILEOSTOMY  2020   diverting colostomy, [employer]    ALLERGIES:  has No Known Allergies.   MEDICATIONS:  Current Facility-Administered Medications  Medication Dose Route Frequency Provider Last Rate Last Admin  . 0.9 %  sodium chloride infusion   Intravenous PRN [place] Coss, MD    Stopped at 02/05/19 2258  . acetaminophen (TYLENOL) tablet 650 mg  650 mg Oral Q6H PRN Reubin Milan, MD       Or  . acetaminophen (TYLENOL) suppository 650 mg  650 mg Rectal Q6H PRN Reubin Milan, MD      . ceFEPIme (MAXIPIME) 2 g in sodium chloride 0.9 % 100 mL IVPB  2 g Intravenous Q8H Reubin Milan, MD   Stopped at 02/06/19 [phone]  . Chlorhexidine Gluconate Cloth 2 % PADS 6 each  6 each Topical Daily [place] Coss, MD   6 each at 02/05/19 1755  . enoxaparin (LOVENOX) injection 40 mg  40 mg Subcutaneous Daily Reubin Milan, MD   40 mg at 02/05/19 1007  . feeding supplement (ENSURE ENLIVE) (ENSURE ENLIVE) liquid 237 mL  237 mL Oral BID BM Reubin Milan, MD   237 mL at 02/05/19 1639  . ferrous sulfate tablet 325 mg  325 mg Oral Q breakfast Reubin Milan, MD   325 mg at 02/05/19 0258  . gabapentin (NEURONTIN) capsule 100 mg  100 mg Oral QHS PRN Reubin Milan, MD      . lactated ringers infusion   Intravenous Continuous Erick Colace, NP 100 mL/hr at 02/06/19 0600 Rate Verify at 02/06/19  0600  . metroNIDAZOLE (FLAGYL) IVPB 500 mg  500 mg Intravenous Q8H Reubin Milan, MD 100 mL/hr at 02/06/19 0639 500 mg at 02/06/19 0639  . metroNIDAZOLE (METROGEL) 3.33 % gel 1 application  1 application Topical Daily Reubin Milan, MD      . midodrine (PROAMATINE) tablet 5 mg  5 mg Oral BID PRN Julian Hy, DO      . ondansetron [facility]) tablet 4 mg  4 mg Oral Q6H PRN Reubin Milan, MD       Or  . ondansetron [facility]) injection 4 mg  4 mg Intravenous Q6H PRN Reubin Milan, MD      . oxyCODONE (Oxy IR/ROXICODONE) immediate release tablet 20 mg  20 mg Oral QID PRN Reubin Milan, MD   20 mg at 02/05/19 2303  . senna-docusate (Senokot-S) tablet 1 tablet  1 tablet Oral Daily PRN Reubin Milan, MD      . vancomycin (VANCOCIN) IVPB 1000 mg/200 mL premix  1,000 mg Intravenous Q12H Dorrene German, [place] at 02/05/19 2021     VITAL SIGNS: BP 93/62   Pulse 90   Temp 98.1 F (36.7 C) (Oral)   Resp 15   Ht 6' 1"  (1.854 m)   Wt 57.6 kg   SpO2 100%   BMI 16.76 kg/m  Filed Weights   02/04/19 2136  Weight: 57.6 kg    Estimated body mass index is 16.76 kg/m as calculated from the following:   Height as of this encounter: 6' 1"  (1.854 m).   Weight as of this encounter: 57.6 kg.  LABS: CBC:    Component Value Date/Time   WBC 9.9 02/05/2019 1804   HGB 9.0 (L) 02/05/2019 1804   HGB 7.9 (L) 02/04/2019 1527   HGB 7.4 (L) 10/17/2018 1446   HGB 12.7 (L) 06/26/2014 1219   HCT 29.7 (L) 02/05/2019 1804   HCT 23.3 (L) 10/17/2018 1446   HCT 38.6 06/26/2014 1219   PLT 461 (H) 02/05/2019 1804   PLT 578 (H) 02/04/2019 1527   PLT 621 (H) 10/17/2018 1446   Comprehensive Metabolic Panel:    Component Value Date/Time   NA 134 (L) 02/05/2019 0455   NA 132 (L) 10/17/2018 1515   NA 141 06/26/2014 1219   K 4.3 02/05/2019 0455   K 3.6 06/26/2014 1219   CO2 24 02/05/2019 0455   CO2 26 06/26/2014 1219   BUN 9 02/05/2019 0455   BUN 11 10/17/2018 1515   BUN 8.4 06/26/2014 1219   CREATININE 0.75 02/05/2019 0455   CREATININE 0.78 02/04/2019 1527   CREATININE 0.8 06/26/2014 1219   ALBUMIN 2.0 (L) 02/05/2019 0455   ALBUMIN 3.4 (L) 06/26/2014 1219     Review of Systems  Constitutional: Positive for appetite change and fatigue.  Gastrointestinal: Positive for rectal pain.  Neurological: Positive for weakness.  All other systems reviewed and are negative.  Physical Exam General: NAD, frail chronically-ill appearing, cachectic Cardiovascular: regular rate and rhythm Pulmonary: clear ant fields Abdomen: soft, nontender, + bowel sounds, colostomy, suprapubic catheter GU: no suprapubic tenderness, urine cloudy Extremities: no edema, no joint deformities Skin: no rashes, thin, warm, dry (large sacral/anal wound-not observed dressing intact) Neurological: awake, A&O x3, mood appropriate   Prognosis: Guarded  - Poor   Discharge Planning:  To Be Determined  Recommendations:  Full Code-as confirmed by patient  Continue with current plan of care per medical team  Patient remains hopeful. Reports he is now in  agreement with chemotherapy if offered. Has refused in past. Requesting full aggressive medical interventions.   He is followed by Palliative at [facility]. Reports pain regimen is appropriate.   PMT will continue to support and follow as needed.    Palliative Performance Scale: PPS 30%               Patient expressed understanding and was in agreement with this plan.   Thank you for allowing the Palliative Medicine Team to assist in the care of this patient.  Time In: 0815 Time Out: 0900 Time Total: 45 min.   Visit consisted of counseling and education dealing with the complex and emotionally intense issues of symptom management and palliative care in the setting of serious and potentially life-threatening illness.Greater than 50%  of this time was spent counseling and coordinating care related to the above assessment and plan.  Signed by:  Alda Lea, AGPCNP-BC Palliative Medicine Team  Phone: [phone] Fax: [phone] Pager: [phone] Amion: Bjorn Pippin

## 2019-02-06 NOTE — Progress Notes (Signed)
PT Hydrotherapy Cancellation Note / Screen  Patient Details Name: [name] MRN: [id number] DOB: 20-Nov-1970   Cancelled Treatment:     Received hydrotherapy evaluation consult.  Multiple contraindications for hydrotherapy (pulsatile lavage) present.  Pt with chronic nonhealing rectal/sacral wound.  Per chart review, "persistent progression of invasion into his pelvis with ulceration.  Has gotten third opinion at [facility] in fourth through [facility].  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 I suspect he has some urosepsis from his rectourethral fistula."  PT to sign off.   [name] 02/06/2019, 10:17 AM Arlyce Dice, DPT Acute Rehabilitation Services Office: [phone]

## 2019-02-06 NOTE — Progress Notes (Signed)
NAME:  [name], MRN:  [id number], DOB:  1970/07/14, LOS: 2 ADMISSION DATE:  02/04/2019, CONSULTATION DATE:  12/22 REFERRING MD: Lonn Georgia  , CHIEF COMPLAINT:  Sepsis    Brief History   48 year old w/ metastatic anal cancer and non-healing anal/sacral wound (has diverting colostomy and SP cath but yet to have chemo) admitted 12/22 w/ sepsis.    Past Medical History  Suprapubic catheter, diverting colostomy December 2020 for  recto urethral fistula.  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  Hodgkin's lymphoma ->marked decline post-op. Yet to start therapy.  Significant Hospital Events   12/21 admitted with working diagnosis of sepsis, cultures obtained IV fluids administered broad-spectrum antibiotics initiated 12/22 hemoglobin dropped from 7.9-6.6, transfused 1 unit, remained slightly hypotensive with systolic blood pressure in the low 80s MAP less than 60 although lactic acid remained normal and no new renal insults.  Critical care asked to see for hypotension 12/23 BP still boarderline. Started stress dose steroid regimen for Relative adrenal insuff and scheduled midodrine. Polymicrobial culture from [facility] cath so order placed to change and re-culture.   Consults:  Critical care consulted 12/22 Surgery consulted 12/22   Procedures:    Significant Diagnostic Tests:  CT abdomen pelvis 12/21: Suprapubic catheter with moderate to marked diffuse low-density bladder wall thickening and soft tissue stranding.  Interval increase in size of soft tissue mass in the inferior aspect of the large cavity of the perineal region No significant change in the additional mass in the superior aspect of the large cavity.  Interval multiple small nodules in the right lower lobe and right middle lobe.  Probable right common and external iliac metastasis/adenopathy.  Curvilinear density at L3 vertebrae moderate size right  hydrocele  Micro Data:  COVID-19 12/21: Negative Blood cultures x2  12/21:>>> UC 12/22: multiple species  Antimicrobials:  Cefepime 12/21>>> Flagyl 12/21>>12/23 Vancomycin 12/21>>>   Interim history/subjective:  Feeling better   Objective   Blood pressure 93/62, pulse 90, temperature 98.1 F (36.7 C), temperature source Oral, resp. rate 15, height 6\' 1"  (1.854 m), weight 57.6 kg, SpO2 100 %.        Intake/Output Summary (Last 24 hours) at 02/06/2019 0838 Last data filed at 02/06/2019 0600 Gross per 24 hour  Intake 2733.08 ml  Output 1670 ml  Net 1063.08 ml   Filed Weights   02/04/19 2136  Weight: 57.6 kg    Examination: General 48 year old black male resting in bed. Not in distress HENT NCAT no JVD does have temporal wasting. MMM Pulm clear and w/out accessory use  Card RRR w/out MRG abd not tender. Colostomy stoma pink. SP cath stoma unremarkable GU cloudy urine  Neuro intact MS sacral/anal wound packed and unremarkable.  Ext warm and dry strong pulses.   Resolved Hospital Problem list     Assessment & Plan:   Severe sepsis/septic shock in the setting of probable acute cystitis/urinary tract infection complicated further by relative adrenal dysfxn.  -LA nml No new organ dysfxn BP still boarderline Plan Cont MIVFs SBP goal > 90  Add scheduled Midodrine tid Add stress dose steroids (solucortef X 3 days then start oral hydrocortisone 10 am 5mg  afternoon w/ florinef 0.05mg  q am) Change out suprapubic cath today and re-culture urine Dc flagyl  Day 2 vanc and cefepime.  Should see endocrine after dc  chronic non-healing anal/sacral wound  Plan Cont routine wound care  Anemia.  No evidence  of bleeding.  Suspect this is more of bone marrow suppression in the setting of his cancer S/p 1 unit PRBCs 12/22 w/ appropriate hgb bump Plan SCDs LMWH Trend cbc  Metastatic anal cancer with metastasis to peritoneal cavity, lungs, and probably  bone Plan Palliative following F/u with med/onc at dc although I don't see him having any therapeutic options at this point.     Best practice:  Diet: reg Pain/Anxiety/Delirium protocol (if indicated): NA VAP protocol (if indicated): NA DVT prophylaxis: scd GI prophylaxis: PPI Glucose control: NA Mobility: BR Code Status: full code Family Communication: pending  Disposition: SDU status   Erick Colace ACNP-BC [place] Pager # [phone] OR # [phone] if no answer

## 2019-02-06 NOTE — Procedures (Signed)
S/P catheter change  The site was prepped w/ betadine. Suture cut.  The catheter was removed w/out difficulty.  Then using sterile technique a new 16 fr foley catheter was inserted thru the existing S/P catheter.  The balloon was inflated w/10 ml sterile water.  Pt did have some discomfort associated w/ removal of the suture and initial catheter but this subsided after the completion of the procedure.  Overall tolerated well.   Will need to be sure that he has [place] set up to change this every 3-4 weeks   Erick Colace ACNP-BC [place] Pager # [phone] OR # [phone] if no answer

## 2019-02-06 NOTE — Progress Notes (Signed)
No acute surgical plans while here as patient is already diverted with his urinary and colonic fistulae.  His wound is chronic and not much else to do for this but local wound care.  Plans are for outpatient follow up with oncology for possible further treatment.  He should follow up with his surgical teams who care for his an [facility] for further needs for his urocolonic needs and rectal wound.  No plans for surgical intervention while in the hospital.  Will defer further management of anemia, etc to primary service and oncology.  Discussed with Dr. Tawanna Solo.  [name] 8:35 AM 02/06/2019

## 2019-02-06 NOTE — Progress Notes (Signed)
Nursing reported run of VT Will check Mg and [place] ACNP-BC [place] Pager # [phone] OR # [phone] if no answer

## 2019-02-07 DIAGNOSIS — I9589 Other hypotension: Secondary | ICD-10-CM

## 2019-02-07 LAB — MAGNESIUM: Magnesium: 1.8 mg/dL (ref 1.7–2.4)

## 2019-02-07 LAB — CBC WITH DIFFERENTIAL/PLATELET
Abs Immature Granulocytes: 0.04 10*3/uL (ref 0.00–0.07)
Basophils Absolute: 0 10*3/uL (ref 0.0–0.1)
Basophils Relative: 0 %
Eosinophils Absolute: 0 10*3/uL (ref 0.0–0.5)
Eosinophils Relative: 0 %
HCT: 26.1 % — ABNORMAL LOW (ref 39.0–52.0)
Hemoglobin: 7.9 g/dL — ABNORMAL LOW (ref 13.0–17.0)
Immature Granulocytes: 0 %
Lymphocytes Relative: 10 %
Lymphs Abs: 0.9 10*3/uL (ref 0.7–4.0)
MCH: 22.3 pg — ABNORMAL LOW (ref 26.0–34.0)
MCHC: 30.3 g/dL (ref 30.0–36.0)
MCV: 73.7 fL — ABNORMAL LOW (ref 80.0–100.0)
Monocytes Absolute: 0.4 10*3/uL (ref 0.1–1.0)
Monocytes Relative: 4 %
Neutro Abs: 7.8 10*3/uL — ABNORMAL HIGH (ref 1.7–7.7)
Neutrophils Relative %: 86 %
Platelets: 459 10*3/uL — ABNORMAL HIGH (ref 150–400)
RBC: 3.54 MIL/uL — ABNORMAL LOW (ref 4.22–5.81)
RDW: 23.7 % — ABNORMAL HIGH (ref 11.5–15.5)
WBC: 9.1 10*3/uL (ref 4.0–10.5)
nRBC: 0 % (ref 0.0–0.2)

## 2019-02-07 LAB — URINE CULTURE

## 2019-02-07 NOTE — Evaluation (Signed)
Occupational Therapy Evaluation Patient Details Name: [name] MRN: [id number] DOB: Dec 02, 1970 Today's Date: 02/07/2019    History of Present Illness Pt is a 48 year old male admitted for severe sepsis/septic shock in the setting of probable acute cystitis/urinary tract infection complicated further by relative adrenal dysfxn and chronic nonhealing anal/sacral wound.  PMHx significant for non Hodgkin's lymphoma, rectal cancer metastatic to lung   Clinical Impression   Pt was admitted for the above. Up until a week ago, he was independent with adls.  He reports that he was in bed x 1 week.  He needs mostly set up and min guard assist for basic adls, but he did not walk today. Will follow in acute setting with supervision level goals    Follow Up Recommendations  Supervision/Assistance - 24 hour    Equipment Recommendations  (tba further)    Recommendations for Other Services       Precautions / Restrictions Precautions Precaution Comments: colostomy and suprapubic catheter, infected anal and sacral wound Restrictions Weight Bearing Restrictions: No      Mobility Bed Mobility Overal bed mobility: Needs Assistance Bed Mobility: Supine to Sit;Sit to Supine     Supine to sit: Supervision Sit to supine: Supervision      Transfers Overall transfer level: Needs assistance Equipment used: Rolling walker (2 wheeled) Transfers: Sit to/from Stand Sit to Stand: Min guard              Balance                                           ADL either performed or assessed with clinical judgement   ADL Overall ADL's : Needs assistance/impaired Eating/Feeding: Independent   Grooming: Set up   Upper Body Bathing: Set up   Lower Body Bathing: Min guard   Upper Body Dressing : Set up   Lower Body Dressing: Min guard                 General ADL Comments: pt stood x 2 and stepped forward and backwards.  Used standard Web designer      Pertinent Vitals/Pain Pain Assessment: No/denies pain     Hand Dominance Right   Extremity/Trunk Assessment Upper Extremity Assessment Upper Extremity Assessment: Overall WFL for tasks assessed           Communication Communication Communication: No difficulties   Cognition Arousal/Alertness: Awake/alert Behavior During Therapy: WFL for tasks assessed/performed Overall Cognitive Status: Within Functional Limits for tasks assessed                                     General Comments       Exercises     Shoulder Instructions      Home Living Family/patient expects to be discharged to:: Private residence Living Arrangements: Other relatives Available Help at Discharge: Family;Available 24 hours/day Type of Home: House             Bathroom Shower/Tub: Teacher, early years/pre: Standard     Home Equipment: None   Additional Comments: has been independent and not usign RW      Prior Functioning/Environment Level of Independence: Independent  Comments: up until a week ago, he was totally independent.  Stood in shower.  has been in bed x 1 week        OT Problem List: Decreased strength;Decreased activity tolerance;Decreased knowledge of use of DME or AE;Impaired balance (sitting and/or standing)      OT Treatment/Interventions: Self-care/ADL training;DME and/or AE instruction;Therapeutic activities;Patient/family education;Balance training    OT Goals(Current goals can be found in the care plan section) Acute Rehab OT Goals Patient Stated Goal: regain strength OT Goal Formulation: With patient Time For Goal Achievement: 02/21/19 Potential to Achieve Goals: Good ADL Goals Additional ADL Goal #1: pt will gather clothes with supervision and perform ADL without supervision Additional ADL Goal #2: pt will ambulate to bathroom at supervision level to manage ostomy  OT Frequency: Min  2X/week   Barriers to D/C:            Co-evaluation   Reason for Co-Treatment: For patient/therapist safety;To address functional/ADL transfers PT goals addressed during session: Mobility/safety with mobility OT goals addressed during session: ADL's and self-care;Strengthening/ROM      AM-PAC OT "6 Clicks" Daily Activity     Outcome Measure Help from another person eating meals?: None Help from another person taking care of personal grooming?: A Little Help from another person toileting, which includes using toliet, bedpan, or urinal?: A Little Help from another person bathing (including washing, rinsing, drying)?: A Little Help from another person to put on and taking off regular upper body clothing?: A Little Help from another person to put on and taking off regular lower body clothing?: A Little 6 Click Score: 19   End of Session    Activity Tolerance: Patient limited by fatigue Patient left: in bed;with call bell/phone within reach;with bed alarm set  OT Visit Diagnosis: Muscle weakness (generalized) (M62.81)                Time: [id number] OT Time Calculation (min): 28 min Charges:  OT General Charges $OT Visit: 1 Visit OT Evaluation $OT Eval Low Complexity: 1 Low  [name] S, OTR/L Acute Rehabilitation Services 02/07/2019  [place] 02/07/2019, 1:01 PM

## 2019-02-07 NOTE — Evaluation (Signed)
Physical Therapy Evaluation Patient Details Name: Dylan [name] MRN: [id number] DOB: [birth year] Today's Date: 02/07/2019   History of Present Illness  Pt is a 48 year old male admitted for severe sepsis/septic shock in the setting of probable acute cystitis/urinary tract infection complicated further by relative adrenal dysfxn and chronic nonhealing anal/sacral wound.  PMHx significant for non Hodgkin's lymphoma, rectal cancer metastatic to lung  Clinical Impression  Pt admitted with above diagnosis. Pt currently with functional limitations due to the deficits listed below (see PT Problem List). Pt will benefit from skilled PT to increase their independence and safety with mobility to allow discharge to the venue listed below.  Pt presents with generalized weakness however reports he was mostly in the bed for the past week.  Pt agreeable to mobilize however only wanted to stand at EOB, declined ambulating today.  Pt also with bleeding from anal/sacral wound site with standing.    Follow Up Recommendations Home health PT    Equipment Recommendations  Rolling walker with 5" wheels    Recommendations for Other Services       Precautions / Restrictions Precautions Precaution Comments: colostomy and suprapubic catheter, infected anal and sacral wound Restrictions Weight Bearing Restrictions: No      Mobility  Bed Mobility Overal bed mobility: Needs Assistance Bed Mobility: Supine to Sit;Sit to Supine     Supine to sit: Supervision Sit to supine: Supervision   General bed mobility comments: slow to mobilize  Transfers Overall transfer level: Needs assistance Equipment used: Rolling walker (2 wheeled) Transfers: Sit to/from Stand Sit to Stand: Min guard         General transfer comment: min/guard for safety, able to stand for 3-4 minutes  Ambulation/Gait             General Gait Details: pt declined  Stairs            Wheelchair Mobility    Modified Rankin  (Stroke Patients Only)       Balance Overall balance assessment: (denies falls)                                           Pertinent Vitals/Pain Pain Assessment: No/denies pain    Home Living Family/patient expects to be discharged to:: Private residence Living Arrangements: Other relatives Available Help at Discharge: Family;Available 24 hours/day Type of Home: House         Home Equipment: None Additional Comments: has been independent and not usign RW    Prior Function Level of Independence: Independent         Comments: up until a week ago, he was totally independent.  Stood in shower.  has been in bed x 1 week     Hand Dominance   Dominant Hand: Right    Extremity/Trunk Assessment   Upper Extremity Assessment Upper Extremity Assessment: Overall WFL for tasks assessed    Lower Extremity Assessment Lower Extremity Assessment: Generalized weakness    Cervical / Trunk Assessment Cervical / Trunk Assessment: Other exceptions Cervical / Trunk Exceptions: cachectic appearance  Communication   Communication: No difficulties  Cognition Arousal/Alertness: Awake/alert Behavior During Therapy: WFL for tasks assessed/performed Overall Cognitive Status: Within Functional Limits for tasks assessed  General Comments      Exercises     Assessment/Plan    PT Assessment Patient needs continued PT services  PT Problem List Decreased balance;Decreased strength;Decreased mobility;Decreased activity tolerance;Decreased knowledge of use of DME       PT Treatment Interventions DME instruction;Therapeutic activities;Functional mobility training;Balance training;Patient/family education;Therapeutic exercise;Gait training    PT Goals (Current goals can be found in the Care Plan section)  Acute Rehab PT Goals Patient Stated Goal: regain strength PT Goal Formulation: With patient Time For Goal  Achievement: 02/21/19 Potential to Achieve Goals: Good    Frequency Min 3X/week   Barriers to discharge        Co-evaluation PT/OT/SLP Co-Evaluation/Treatment: Yes Reason for Co-Treatment: For patient/therapist safety;To address functional/ADL transfers PT goals addressed during session: Mobility/safety with mobility OT goals addressed during session: ADL's and self-care;Strengthening/ROM       AM-PAC PT "6 Clicks" Mobility  Outcome Measure Help needed turning from your back to your side while in a flat bed without using bedrails?: A Little Help needed moving from lying on your back to sitting on the side of a flat bed without using bedrails?: A Little Help needed moving to and from a bed to a chair (including a wheelchair)?: A Little Help needed standing up from a chair using your arms (e.g., wheelchair or bedside chair)?: A Little Help needed to walk in hospital room?: A Little Help needed climbing 3-5 steps with a railing? : A Lot 6 Click Score: 17    End of Session   Activity Tolerance: Patient tolerated treatment well Patient left: with call bell/phone within reach;in bed;with bed alarm set Nurse Communication: Mobility status PT Visit Diagnosis: Difficulty in walking, not elsewhere classified (R26.2);Muscle weakness (generalized) (M62.81)    Time: [id number] PT Time Calculation (min) (ACUTE ONLY): 25 min   Charges:   PT Evaluation $PT Eval Low Complexity: 1 Low     Kati PT, DPT Acute Rehabilitation Services Office: [phone]  Jaser Fullen,KATHrine E 02/07/2019, 1:40 PM

## 2019-02-07 NOTE — Progress Notes (Signed)
NAME:  Dylan [name], MRN:  [id number], DOB:  11-21-1970, LOS: 3 ADMISSION DATE:  02/04/2019, CONSULTATION DATE:  12/22 REFERRING MD: Lonn Georgia  , CHIEF COMPLAINT:  Sepsis    Brief History   48 year old w/ metastatic anal cancer and non-healing anal/sacral wound (has diverting colostomy and SP cath but yet to have chemo) admitted 12/22 w/ sepsis.    Past Medical History  Suprapubic catheter, diverting colostomy December 2020 for  recto urethral fistula.  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  Hodgkin's lymphoma ->marked decline post-op. Yet to start therapy.  Significant Hospital Events   12/21 admitted with working diagnosis of sepsis, cultures obtained IV fluids administered broad-spectrum antibiotics initiated 12/22 hemoglobin dropped from 7.9-6.6, transfused 1 unit, remained slightly hypotensive with systolic blood pressure in the low 80s MAP less than 60 although lactic acid remained normal and no new renal insults.  Critical care asked to see for hypotension 12/23 BP still boarderline. Started stress dose steroid regimen for Relative adrenal insuff and scheduled midodrine. Polymicrobial culture from [facility] cath so suprapubic catheter was changed 12/24 hemodynamically stable critical care signing off  Consults:  Critical care consulted 12/22 Surgery consulted 12/22   Procedures:    Significant Diagnostic Tests:  CT abdomen pelvis 12/21: Suprapubic catheter with moderate to marked diffuse low-density bladder wall thickening and soft tissue stranding.  Interval increase in size of soft tissue mass in the inferior aspect of the large cavity of the perineal region No significant change in the additional mass in the superior aspect of the large cavity.  Interval multiple small nodules in the right lower lobe and right middle lobe.  Probable right common and external iliac metastasis/adenopathy.  Curvilinear density at L3  vertebrae moderate size right hydrocele  Micro Data:  COVID-19 12/21: Negative Blood cultures x2  12/21:>>> UC 12/22: multiple species  Antimicrobials:  Cefepime 12/21>>> Flagyl 12/21>>12/23 Vancomycin 12/21>>>   Interim history/subjective:  Feeling better   Objective   Blood pressure (Abnormal) 98/54, pulse 62, temperature 97.7 F (36.5 C), temperature source Oral, resp. rate 13, height 6\' 1"  (1.854 m), weight 57.6 kg, SpO2 99 %.        Intake/Output Summary (Last 24 hours) at 02/07/2019 0847 Last data filed at 02/07/2019 0600 Gross per 24 hour  Intake 3094.65 ml  Output 2110 ml  Net 984.65 ml   Filed Weights   02/04/19 2136  Weight: 57.6 kg    Examination: General this is a 48 year old black male he is resting comfortably in bed he is in no acute distress this morning and overall looks much improved over the course of his hospitalization HEENT normocephalic atraumatic no jugular venous distention is appreciated his mucous membranes are moist Pulmonary: Clear to auscultation without accessory use Cardiac: Regular rate and rhythm Abdomen: Soft not tender no organomegaly his colostomy is unremarkable, suprapubic catheter is been changed and is unremarkable as well Extremities: Warm and dry pulses are strong Derm.  Sacral wound dressed per recommendations  Resolved Hospital Problem list     Assessment & Plan:   Severe sepsis/septic shock in the setting of probable acute cystitis/urinary tract infection complicated further by relative adrenal dysfxn.  Blood pressure looks improved, no evidence of endorgan dysfunction Repeat urine culture sent with clean suprapubic catheter in place Plan KVO IV fluids  I would continue midodrine  Continue stress dose steroids, he is on day #2 of 3 Solu-Cortef with plan to change  to hydrocortisone 10 mg in a.m. 5 mg in the afternoon with Florinef 0.05 mg daily given how good he looks, could consider changing to the oral regimen today  in hopes of possibly getting him home soon  Day #3 vancomycin and cefepime, awaiting cultures, his MRSA screen was negative I think we can stop vancomycin He would benefit from endocrine evaluation after discharge  chronic non-healing anal/sacral wound  Plan Continuing routine wound care  Anemia.  No evidence of bleeding.  Suspect this is more of bone marrow suppression in the setting of his cancer No evidence of bleeding overnight, had a hemoglobin down to 9 post transfusion but I think the 7.9 today reflects a more accurate and expected change Plan Trend CBC Continuing low molecular weight heparin  Metastatic anal cancer with metastasis to peritoneal cavity, lungs, and probably bone Plan Palliative care following  Follow-up with medical oncology I have a hard time believing he will be a candidate for chemotherapy anytime soon    Best practice:  Diet: reg Pain/Anxiety/Delirium protocol (if indicated): NA VAP protocol (if indicated): NA DVT prophylaxis: scd GI prophylaxis: PPI Glucose control: NA Mobility: BR Code Status: full code Family Communication: pending  Disposition: We will defer to internal medicine service, I think he can go to [facility] today  Erick Colace ACNP-BC [place] Pager # [phone] OR # [phone] if no answer

## 2019-02-07 NOTE — Progress Notes (Signed)
PROGRESS NOTE    Dylan [name]  [id number] DOB: Jun 19, 1970 DOA: 02/04/2019 PCP: [name], MD   Brief Narrative:  Patient is a 48 year old male with history of metastatic anal cancer status post radiation, surgery who was referred to the emergency department by his oncologist for the evaluation of hypertension, infection of the anal cancer area, tachycardia.  Patient was found to be severely dehydrated, weak and was unable to ambulate.  No fever or chills at home but  complaint of night sweats and fatigue.  There was report of urine and feces coming out of his anal wound though he has colostomy and suprapubic catheter. On presentation he was hypotensive, tachycardic.  Septic shock was suspected.  Patient was started on broad-spectrum antibiotics.  Source could be infected anal wound versus UTI due to fistula.  Blood pressure did not improve with IV fluids so PCCM consulted and he was moved to ICU.  Wound care and general surgery also consulted.  12/24:Blood pressure is currently stable .  Palliative care was also following and patient remains full code and he is interested in continued management for his cancer.  Plan is to continue antibiotic treatment and wound care and discharge to home when hemodynamically  stable.  Plan is  to transfer him to the floor.  He participated with physical therapy and occupational therapy.  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  On   Midodrine, Florinef and hydrocortisone.  Urine culture showed multiple species.  Blood cultures negative so far.  Infected anal wound: Has necrotic, foul-smelling rectal/anal wound.  No fluid collection seen on CT scan.  General surgery consulted but as per them there is no in indication for surgical intervention.  Recommended continued wound care therapy . He had several surgeries done in the past in different hospitals. He is a status post colostomy and suprapubic catheter placement.Changed suprapubic catheter.  Metastatic anal cancer: Status post radiation therapy, surgery.  Status post diverting colostomy on 01/17/2019 at [facility].  Also had suprapubic catheter placement.  He was complaining of a stool and bloody discharge from the wound.  He has history of noncompliance.  Follows with oncology.  Oncology planning for new PET/CT for restaging.  Oncology planning for initiating chemotherapy as an outpatient.  Microcytic anemia: Continue to monitor H&H. Hb dropped to 6.6 and has been transfused with  PRBC.  Iron panel showed severe iron deficiency .  We will give him a dose of IV iron  Goals of care /advanced metastatic cancer: Palliative care was following.  Remains full code  Debility/deconditioning: PT/OT following.  Nutrition Problem: Increased nutrient needs Etiology: acute illness, chronic illness      DVT prophylaxis:Lovenox Code Status: Full Family Communication: Discussed with patient himself about the management plan Disposition Plan: Likely home when clinically stable.Awaiting recommendation from PT/OT   Consultants: General  surgery, oncology, PCCM  Procedures: None  Antimicrobials:  Anti-infectives (From admission, onward)   Start     Dose/Rate Route Frequency Ordered Stop   02/05/19 0600  metroNIDAZOLE (FLAGYL) IVPB 500 mg  Status:  Discontinued     500  mg 100 mL/hr over 60 Minutes Intravenous Every 8 hours 02/05/19 0115 02/06/19 0900   02/05/19 0400  vancomycin (VANCOCIN) IVPB 1000 mg/200 mL premix  Status:  Discontinued     1,000 mg 200 mL/hr over 60 Minutes Intravenous Every 12 hours 02/05/19 0121 02/07/19 0857   02/05/19 0200  ceFEPIme (MAXIPIME) 2 g in sodium chloride 0.9 % 100 mL IVPB     2 g 200 mL/hr over 30 Minutes Intravenous Every 8 hours 02/04/19 2307     02/04/19 1715  ceFEPIme (MAXIPIME) 2 g in sodium chloride 0.9 % 100 mL IVPB     2 g 200 mL/hr over 30 Minutes Intravenous  Once 02/04/19 1702 02/04/19 2025   02/04/19 1715  metroNIDAZOLE (FLAGYL) IVPB 500 mg     500 mg 100 mL/hr over 60 Minutes Intravenous  Once 02/04/19 1702 02/04/19 2234   02/04/19 1715  vancomycin (VANCOCIN) IVPB 1000 mg/200 mL premix     1,000 mg 200 mL/hr over 60 Minutes Intravenous  Once 02/04/19 1702 02/04/19 2025      Subjective:  Patient seen and examined the bedside this morning.  He was participating with PT/OT.  Looks comfortable but denies any complaints.  Blood pressure stable  Objective: Vitals:   02/07/19 0735 02/07/19 0800 02/07/19 0900 02/07/19 1120  BP:  (!) 98/54 (!) 102/53   Pulse:  62 62   Resp:  13 14   Temp: 97.7 F (36.5 C)   97.7 F (36.5 C)  TempSrc: Oral   Oral  SpO2:  99% 100%   Weight:      Height:        Intake/Output Summary (Last 24 hours) at 02/07/2019 1322 Last data filed at 02/07/2019 0800 Gross per 24 hour  Intake 2886.95 ml  Output 2110 ml  Net 776.95 ml   Filed Weights   02/04/19 2136  Weight: 57.6 kg    Examination:  General exam: Deconditioned, debilitated, generalized weakness HEENT:PERRL Ear/Nose normal on gross exam Respiratory system: Bilateral equal air entry, normal vesicular breath sounds, no wheezes or crackles  Cardiovascular system: S1 & S2 heard, RRR. No JVD, murmurs, rubs, gallops or clicks. No pedal edema. Gastrointestinal system: Abdomen is nondistended, soft and nontender.  No organomegaly or masses felt. Normal bowel sounds heard.  Colostomy, suprapubic catheter Central nervous system: Alert and oriented. No focal neurological deficits. Extremities: No edema, no clubbing ,no cyanosis, distal peripheral pulses palpable. Skin: Infected anal  Wound covered with dressing Pic taken on presentation:       Data Reviewed: I have personally reviewed following labs and imaging studies  CBC: Recent Labs  Lab 02/04/19 1527 02/04/19 1729 02/05/19 0455 02/05/19 1804 02/07/19 0147  WBC 13.9* 14.1* 10.6* 9.9 9.1  NEUTROABS 10.9* 11.2* 7.9*  --  7.8*  HGB 7.9* 8.1* 6.6* 9.0* 7.9*  HCT 26.8* 27.3* 22.2* 29.7* 26.1*  MCV 71.1* 71.3* 72.1* 75.8* 73.7*  PLT 578* 506* 430* 461* [id number]*   Basic Metabolic Panel: Recent Labs  Lab 02/04/19 1527 02/04/19 1729 02/05/19 0455 02/06/19 1219 02/07/19 0147  NA 134* 137 134* 135  --   K 4.0 4.2 4.3 3.7  --   CL 101 102 105 103  --   CO2 21* 26  24 24  --   GLUCOSE 97 98 109* 107*  --   BUN 9 10 9  5*  --   CREATININE 0.78 0.86 0.75 0.68  --   CALCIUM 9.5 10.2 8.9 8.9  --   MG  --   --   --   --  1.8   GFR: Estimated Creatinine Clearance: 90 mL/min (by C-G formula based on SCr of 0.68 mg/dL). Liver Function Tests: Recent Labs  Lab 02/04/19 1527 02/04/19 1729 02/05/19 0455 02/06/19 1219  AST 16 15 10* 11*  ALT 18 19 15 12   ALKPHOS 102 107 70 69  BILITOT 0.2* 0.6 0.4 0.5  PROT 8.7* 8.5* 6.9 7.2  ALBUMIN 2.3* 2.6* 2.0* 2.2*   No results for input(s): LIPASE, AMYLASE in the last 168 hours. No results for input(s): AMMONIA in the last 168 hours. Coagulation Profile: Recent Labs  Lab 02/04/19 1729  INR 1.2   Cardiac Enzymes: No results for input(s): CKTOTAL, CKMB, CKMBINDEX, TROPONINI in the last 168 hours. BNP (last 3 results) No results for input(s): PROBNP in the last 8760 hours. HbA1C: No results for input(s): HGBA1C in the last 72 hours. CBG: No results for input(s): GLUCAP in the last 168  hours. Lipid Profile: No results for input(s): CHOL, HDL, LDLCALC, TRIG, CHOLHDL, LDLDIRECT in the last 72 hours. Thyroid Function Tests: No results for input(s): TSH, T4TOTAL, FREET4, T3FREE, THYROIDAB in the last 72 hours. Anemia Panel: Recent Labs    02/04/19 1527  FERRITIN 184  TIBC 178*  IRON 8*   Sepsis Labs: Recent Labs  Lab 02/04/19 1729 02/05/19 0734  LATICACIDVEN 1.8 1.2    Recent Results (from the past 240 hour(s))  Culture, blood (Routine x 2)     Status: None (Preliminary result)   Collection Time: 02/04/19  5:29 PM   Specimen: BLOOD  Result Value Ref Range Status   Specimen Description   Final    BLOOD LEFT ANTECUBITAL Performed at [facility], [place] [address], [place], [place] [address]    Special Requests   Final    BOTTLES DRAWN AEROBIC AND ANAEROBIC Blood Culture adequate volume Performed at [place] [address] [place], [place] [address]    Culture   Final    NO GROWTH 3 DAYS Performed at [facility], [place] [address], [place], [place] [address]    Report Status PENDING  Incomplete  Culture, blood (Routine x 2)     Status: None (Preliminary result)   Collection Time: 02/04/19  5:29 PM   Specimen: BLOOD  Result Value Ref Range Status   Specimen Description   Final    BLOOD RIGHT WRIST Performed at [place] [address], [place], [place] [address]    Special Requests   Final    BOTTLES DRAWN AEROBIC AND ANAEROBIC Blood Culture adequate volume Performed at [facility] [address], [place], [place] [address]    Culture   Final    NO GROWTH 3 DAYS Performed at [facility], [place] [address] [place], [place] [address]    Report Status PENDING  Incomplete  Urine culture     Status: Abnormal (Preliminary result)   Collection Time: 02/04/19  8:30 PM   Specimen: In/Out Cath Urine  Result Value Ref Range Status   Specimen Description   Final    IN/OUT CATH  URINE Performed at [place] [address], [place], [place] [address]  Special Requests   Final    NONE Performed at [facility], [place] [address] [place], [place] [address]    Culture MULTIPLE SPECIES PRESENT, SUGGEST RECOLLECTION (A)  Final   Report Status PENDING  Incomplete  SARS CORONAVIRUS 2 (TAT 6-24 HRS) Nasopharyngeal Nasopharyngeal Swab     Status: None   Collection Time: 02/04/19  9:36 PM   Specimen: Nasopharyngeal Swab  Result Value Ref Range Status   SARS Coronavirus 2 NEGATIVE NEGATIVE Final    Comment: (NOTE) SARS-CoV-2 target nucleic acids are NOT DETECTED. The SARS-CoV-2 RNA is generally detectable in upper and lower respiratory specimens during the acute phase of infection. Negative results do not preclude SARS-CoV-2 infection, do not rule out co-infections with other pathogens, and should not be used as the sole basis for treatment or other patient management decisions. Negative results must be combined with clinical observations, patient history, and epidemiological information. The expected result is Negative. Fact Sheet for Patients: SugarRoll.be Fact Sheet for Healthcare Providers: https://www.woods-mathews.com/ This test is not yet approved or cleared by the [place] FDA and  has been authorized for detection and/or diagnosis of SARS-CoV-2 by FDA under an Emergency Use Authorization (EUA). This EUA will remain  in effect (meaning this test can be used) for the duration of the COVID-19 declaration under Section 56 4(b)(1) of the Act, 21 U.S.C. section 360bbb-3(b)(1), unless the authorization is terminated or revoked sooner. Performed at [facility], [place] [address] [place], [place] [address]   MRSA PCR Screening     Status: None   Collection Time: 02/05/19  5:36 PM   Specimen: Nasal Mucosa; Nasopharyngeal  Result Value Ref Range Status   MRSA by PCR  NEGATIVE NEGATIVE Final    Comment:        The GeneXpert MRSA Assay (FDA approved for NASAL specimens only), is one component of a comprehensive MRSA colonization surveillance program. It is not intended to diagnose MRSA infection nor to guide or monitor treatment for MRSA infections. Performed at [facility], [place] [address] [place], [place] [address]          Radiology Studies: No results found.      Scheduled Meds: . Chlorhexidine Gluconate Cloth  6 each Topical Daily  . enoxaparin (LOVENOX) injection  40 mg Subcutaneous Daily  . feeding supplement (ENSURE ENLIVE)  237 mL Oral BID BM  . feeding supplement  237 mL Oral BID BM  . ferrous sulfate  325 mg Oral Q breakfast  . [START ON 02/09/2019] fludrocortisone  0.05 mg Oral Daily  . [START ON 02/09/2019] hydrocortisone  10 mg Oral q morning - 10a  . [START ON 02/09/2019] hydrocortisone  5 mg Oral QPM  . hydrocortisone sod succinate (SOLU-CORTEF) inj  50 mg Intravenous Q6H  . metroNIDAZOLE  1 application Topical Daily  . midodrine  5 mg Oral TID WC  . multivitamin with minerals  1 tablet Oral Daily  . nutrition supplement (JUVEN)  1 packet Oral BID BM   Continuous Infusions: . ceFEPime (MAXIPIME) IV Stopped (02/07/19 0622)  . lactated ringers 10 mL/hr at 02/07/19 0901     LOS: 3 days    Time spent: 25 mins,More than 50% of that time was spent in counseling and/or coordination of care.      Shelly Coss, MD [facility] Pager [phone]  If 7PM-7AM, please contact night-coverage [email or web address] Password [email or web address] 02/07/2019, 1:22 PM

## 2019-02-07 NOTE — Progress Notes (Signed)
Pharmacy Antibiotic Note  Dylan [name] is a 48 y.o. male admitted on 02/04/2019 with UTI.  Pharmacy has been consulted for cefepime dosing.  02/07/2019:  Day 4 abx  Afebrile  WBC WNL  Renal function stable  Repeat Urine cx pending  Plan: Continue Cefepime 2 Gm IV q8h F/U cx data No dose adjustments anticipated.  Pharmacy to sign off & monitor peripherally via electronic surveillance software.  Please re-consult if needed.   Height: 6\' 1"  (185.4 cm) Weight: 127 lb (57.6 kg) IBW/kg (Calculated) : 79.9  Temp (24hrs), Avg:97.9 F (36.6 C), Min:97.7 F (36.5 C), Max:98 F (36.7 C)  Recent Labs  Lab 02/04/19 1527 02/04/19 1729 02/05/19 0455 02/05/19 0734 02/05/19 1804 02/06/19 1219 02/07/19 0147  WBC 13.9* 14.1* 10.6*  --  9.9  --  9.1  CREATININE 0.78 0.86 0.75  --   --  0.68  --   LATICACIDVEN  --  1.8  --  1.2  --   --   --     Estimated Creatinine Clearance: 90 mL/min (by C-G formula based on SCr of 0.68 mg/dL).    No Known Allergies  Antimicrobials this admission: 12/21 cefepime >>  12/21 vancomycin >> 12/24 12/21 Flagyl >>12/22  Dose adjustments this admission:  Microbiology results: 12/21 BCx: NGTD 12/21 UCx: multiple, recollect 12/22 MRSA PCR: negative 12/23 UCx:   Thank you for allowing pharmacy to be a part of this patient's care.  Dylan [name] 02/07/2019 9:19 AM

## 2019-02-08 LAB — BASIC METABOLIC PANEL
Anion gap: 6 (ref 5–15)
BUN: 12 mg/dL (ref 6–20)
CO2: 26 mmol/L (ref 22–32)
Calcium: 8.7 mg/dL — ABNORMAL LOW (ref 8.9–10.3)
Chloride: 106 mmol/L (ref 98–111)
Creatinine, Ser: 0.63 mg/dL (ref 0.61–1.24)
GFR calc Af Amer: >60 mL/min (ref >60)
GFR calc non Af Amer: >60 mL/min (ref >60)
Glucose, Bld: 105 mg/dL — ABNORMAL HIGH (ref 70–99)
Potassium: 4.2 mmol/L (ref 3.5–5.1)
Sodium: 138 mmol/L (ref 135–145)

## 2019-02-08 LAB — CBC WITH DIFFERENTIAL/PLATELET
Abs Immature Granulocytes: 0.07 10*3/uL (ref 0.00–0.07)
Basophils Absolute: 0 10*3/uL (ref 0.0–0.1)
Basophils Relative: 0 %
Eosinophils Absolute: 0 10*3/uL (ref 0.0–0.5)
Eosinophils Relative: 0 %
HCT: 24.7 % — ABNORMAL LOW (ref 39.0–52.0)
Hemoglobin: 7.4 g/dL — ABNORMAL LOW (ref 13.0–17.0)
Immature Granulocytes: 1 %
Lymphocytes Relative: 14 %
Lymphs Abs: 1.5 10*3/uL (ref 0.7–4.0)
MCH: 22.4 pg — ABNORMAL LOW (ref 26.0–34.0)
MCHC: 30 g/dL (ref 30.0–36.0)
MCV: 74.6 fL — ABNORMAL LOW (ref 80.0–100.0)
Monocytes Absolute: 0.6 10*3/uL (ref 0.1–1.0)
Monocytes Relative: 6 %
Neutro Abs: 8.6 10*3/uL — ABNORMAL HIGH (ref 1.7–7.7)
Neutrophils Relative %: 79 %
Platelets: 461 10*3/uL — ABNORMAL HIGH (ref 150–400)
RBC: 3.31 MIL/uL — ABNORMAL LOW (ref 4.22–5.81)
RDW: 24.2 % — ABNORMAL HIGH (ref 11.5–15.5)
WBC: 10.8 10*3/uL — ABNORMAL HIGH (ref 4.0–10.5)
nRBC: 0 % (ref 0.0–0.2)

## 2019-02-08 LAB — URINE CULTURE: Culture: NO GROWTH

## 2019-02-08 NOTE — Progress Notes (Signed)
PROGRESS NOTE    Dylan [name]  [id number] DOB: 1970/06/09 DOA: 02/04/2019 PCP: [name], MD   Brief Narrative:  Patient is a 48 year old male with history of metastatic anal cancer status post radiation, surgery who was referred to the emergency department by his oncologist for the evaluation of hypertension, infection of the anal cancer area, tachycardia.  Patient was found to be severely dehydrated, weak and was unable to ambulate.  No fever or chills at home but  complaint of night sweats and fatigue.  There was report of urine and feces coming out of his anal wound though he has colostomy and suprapubic catheter. On presentation he was hypotensive, tachycardic.  Septic shock was suspected.  Patient was started on broad-spectrum antibiotics.  Source could be infected anal wound versus UTI due to fistula.  Blood pressure did not improve with IV fluids so PCCM consulted and he was moved to ICU.  Wound care and general surgery also consulted.  12/24:Blood pressure is currently stable .  Palliative care was also following and patient remains full code and he is interested in continued management for his cancer.  Plan is to continue antibiotic treatment and wound care ,continuing PT/OT assessment and  discharge to home tomorrow if remains hemodynamically stable.  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 He was on   Midodrine, Florinef and hydrocortisone.  Urine culture showed multiple species.  Blood cultures negative so far.  I will discontinue Florinef and hydrocortisone and continue on Midodrin only  Infected anal wound: Has necrotic, foul-smelling rectal/anal wound.  No fluid collection seen on CT scan.  General surgery consulted but as per them there is no in indication for surgical intervention.  Recommended continued wound care therapy . He had several surgeries done in the past in different hospitals. He is a status post colostomy and suprapubic catheter placement.Changed suprapubic catheter.  Metastatic anal cancer: Status post radiation therapy, surgery.  Status post diverting colostomy on 01/17/2019 at [facility].  Also had suprapubic catheter placement.  He was complaining of a stool and bloody discharge from the wound.  He has history of noncompliance.  Follows with oncology.  Oncology planning for new PET/CT for restaging.  Oncology planning for initiating chemotherapy as an outpatient.  Microcytic anemia: Continue to monitor H&H. Hb dropped to 6.6 and he was  transfused with 2 units of  PRBC.  Iron panel showed severe iron deficiency .  S/p  IV iron.  Continue iron supplementation on discharge.  Goals of care /advanced metastatic cancer: Palliative care was following.  Remains full code  Debility/deconditioning: PT/OT following.  Likely discharge to home with home health  Nutrition Problem: Increased nutrient needs Etiology: acute illness, chronic illness      DVT prophylaxis:Lovenox Code Status: Full Family Communication: Discussed with patient  about the management plan Disposition Plan:  Likely home tomorrow.  Consultants: General surgery, oncology, PCCM  Procedures: None  Antimicrobials:  Anti-infectives (From admission, onward)   Start     Dose/Rate Route Frequency Ordered Stop   02/05/19 0600  metroNIDAZOLE (FLAGYL) IVPB  500 mg  Status:  Discontinued     500 mg 100 mL/hr over 60 Minutes Intravenous Every 8 hours 02/05/19 0115 02/06/19 0900   02/05/19 0400  vancomycin (VANCOCIN) IVPB 1000 mg/200 mL premix  Status:  Discontinued     1,000 mg 200 mL/hr over 60 Minutes Intravenous Every 12 hours 02/05/19 0121 02/07/19 0857   02/05/19 0200  ceFEPIme (MAXIPIME) 2 g in sodium chloride 0.9 % 100 mL IVPB     2 g 200 mL/hr over 30 Minutes Intravenous Every 8 hours 02/04/19 2307     02/04/19 1715  ceFEPIme (MAXIPIME) 2 g in sodium chloride 0.9 % 100 mL IVPB     2 g 200 mL/hr over 30 Minutes Intravenous  Once 02/04/19 1702 02/04/19 2025   02/04/19 1715  metroNIDAZOLE (FLAGYL) IVPB 500 mg     500 mg 100 mL/hr over 60 Minutes Intravenous  Once 02/04/19 1702 02/04/19 2234   02/04/19 1715  vancomycin (VANCOCIN) IVPB 1000 mg/200 mL premix     1,000 mg 200 mL/hr over 60 Minutes Intravenous  Once 02/04/19 1702 02/04/19 2025      Subjective:  Patient seen and examined the bedside this morning.  Currently hemodynamically stable.  Blood pressure has remained improved.  Denies any new complaints.  Still appears weak.  We discussed that you might be able to go home tomorrow if he remains hemodynamically stable.  I will check CBC tomorrow.  Objective: Vitals:   02/07/19 1400 02/07/19 1558 02/07/19 2207 02/08/19 0503  BP: (!) 108/48 109/68 106/69 104/70  Pulse: 62 77 70 (!) 58  Resp: 14 16 17 16   Temp:  98.2 F (36.8 C) (!) 97.5 F (36.4 C) 97.7 F (36.5 C)  TempSrc:  Oral Oral Oral  SpO2: 100% 98% 98% 98%  Weight:  57.1 kg    Height:  6\' 1"  (1.854 m)      Intake/Output Summary (Last 24 hours) at 02/08/2019 1307 Last data filed at 02/08/2019 [id number] Gross per 24 hour  Intake 480 ml  Output 1400 ml  Net -920 ml   Filed Weights   02/04/19 2136 02/07/19 1558  Weight: 57.6 kg 57.1 kg    Examination:  General exam: Deconditioned, debilitated, generalized weakness HEENT:PERRL Ear/Nose normal on gross  exam Respiratory system: Bilateral equal air entry, normal vesicular breath sounds, no wheezes or crackles  Cardiovascular system: S1 & S2 heard, RRR. No JVD, murmurs, rubs, gallops or clicks. No pedal edema. Gastrointestinal system: Abdomen is nondistended, soft and nontender. No organomegaly or masses felt. Normal bowel sounds heard.  Colostomy, suprapubic catheter Central nervous system: Alert and oriented. No focal neurological deficits. Extremities: No edema, no clubbing ,no cyanosis, distal peripheral pulses palpable. Skin: Infected anal  Wound covered with dressing Pic taken on admission:       Data Reviewed: I have personally reviewed following labs and imaging studies  CBC: Recent Labs  Lab 02/04/19 1527 02/04/19 1729 02/05/19 0455 02/05/19 1804 02/07/19 0147 02/08/19 0547  WBC 13.9* 14.1* 10.6* 9.9 9.1 10.8*  NEUTROABS 10.9* 11.2* 7.9*  --  7.8* 8.6*  HGB 7.9* 8.1* 6.6* 9.0* 7.9* 7.4*  HCT 26.8* 27.3* 22.2* 29.7* 26.1* 24.7*  MCV 71.1* 71.3* 72.1* 75.8* 73.7* 74.6*  PLT 578* 506* 430* 461* 459*  123456*   Basic Metabolic Panel: Recent Labs  Lab 02/04/19 1527 02/04/19 1729 02/05/19 0455 02/06/19 1219 02/07/19 0147 02/08/19 0547  NA 134* 137 134* 135  --  138  K 4.0 4.2 4.3 3.7  --  4.2  CL 101 102 105 103  --  106  CO2 21* 26 24 24   --  26  GLUCOSE 97 98 109* 107*  --  105*  BUN 9 10 9  5*  --  12  CREATININE 0.78 0.86 0.75 0.68  --  0.63  CALCIUM 9.5 10.2 8.9 8.9  --  8.7*  MG  --   --   --   --  1.8  --    GFR: Estimated Creatinine Clearance: 89.2 mL/min (by C-G formula based on SCr of 0.63 mg/dL). Liver Function Tests: Recent Labs  Lab 02/04/19 1527 02/04/19 1729 02/05/19 0455 02/06/19 1219  AST 16 15 10* 11*  ALT 18 19 15 12   ALKPHOS 102 107 70 69  BILITOT 0.2* 0.6 0.4 0.5  PROT 8.7* 8.5* 6.9 7.2  ALBUMIN 2.3* 2.6* 2.0* 2.2*   No results for input(s): LIPASE, AMYLASE in the last 168 hours. No results for input(s): AMMONIA in the last 168  hours. Coagulation Profile: Recent Labs  Lab 02/04/19 1729  INR 1.2   Cardiac Enzymes: No results for input(s): CKTOTAL, CKMB, CKMBINDEX, TROPONINI in the last 168 hours. BNP (last 3 results) No results for input(s): PROBNP in the last 8760 hours. HbA1C: No results for input(s): HGBA1C in the last 72 hours. CBG: No results for input(s): GLUCAP in the last 168 hours. Lipid Profile: No results for input(s): CHOL, HDL, LDLCALC, TRIG, CHOLHDL, LDLDIRECT in the last 72 hours. Thyroid Function Tests: No results for input(s): TSH, T4TOTAL, FREET4, T3FREE, THYROIDAB in the last 72 hours. Anemia Panel: No results for input(s): VITAMINB12, FOLATE, FERRITIN, TIBC, IRON, RETICCTPCT in the last 72 hours. Sepsis Labs: Recent Labs  Lab 02/04/19 1729 02/05/19 0734  LATICACIDVEN 1.8 1.2    Recent Results (from the past 240 hour(s))  Culture, blood (Routine x 2)     Status: None (Preliminary result)   Collection Time: 02/04/19  5:29 PM   Specimen: BLOOD  Result Value Ref Range Status   Specimen Description   Final    BLOOD LEFT ANTECUBITAL Performed at [facility], [place] [address], [place], [place] [address]    Special Requests   Final    BOTTLES DRAWN AEROBIC AND ANAEROBIC Blood Culture adequate volume Performed at [place] [address] [place], [place] [address]    Culture   Final    NO GROWTH 4 DAYS Performed at [facility], [place] [address], [place], [place] [address]    Report Status PENDING  Incomplete  Culture, blood (Routine x 2)     Status: None (Preliminary result)   Collection Time: 02/04/19  5:29 PM   Specimen: BLOOD  Result Value Ref Range Status   Specimen Description   Final    BLOOD RIGHT WRIST Performed at [facility] [address], [place], [place] [address]    Special Requests   Final    BOTTLES DRAWN AEROBIC AND ANAEROBIC Blood Culture adequate volume Performed at [place] [address], [place],  [address]    Culture   Final    NO GROWTH 4 DAYS Performed at [facility], [place] [address], [place],  [address]    Report  Status PENDING  Incomplete  Urine culture     Status: Abnormal   Collection Time: 02/04/19  8:30 PM   Specimen: In/Out Cath Urine  Result Value Ref Range Status   Specimen Description   Final    IN/OUT CATH URINE Performed at [place] [address] [place], [place] [address]    Special Requests   Final    NONE Performed at [facility], [place] [address], [place], [place] [address]    Culture MULTIPLE SPECIES PRESENT, SUGGEST RECOLLECTION (A)  Final   Report Status 02/07/2019 FINAL  Final  SARS CORONAVIRUS 2 (TAT 6-24 HRS) Nasopharyngeal Nasopharyngeal Swab     Status: None   Collection Time: 02/04/19  9:36 PM   Specimen: Nasopharyngeal Swab  Result Value Ref Range Status   SARS Coronavirus 2 NEGATIVE NEGATIVE Final    Comment: (NOTE) SARS-CoV-2 target nucleic acids are NOT DETECTED. The SARS-CoV-2 RNA is generally detectable in upper and lower respiratory specimens during the acute phase of infection. Negative results do not preclude SARS-CoV-2 infection, do not rule out co-infections with other pathogens, and should not be used as the sole basis for treatment or other patient management decisions. Negative results must be combined with clinical observations, patient history, and epidemiological information. The expected result is Negative. Fact Sheet for Patients: SugarRoll.be Fact Sheet for Healthcare Providers: https://www.woods-mathews.com/ This test is not yet approved or cleared by the [place] FDA and  has been authorized for detection and/or diagnosis of SARS-CoV-2 by FDA under an Emergency Use Authorization (EUA). This EUA will remain  in effect (meaning this test can be used) for the duration of the COVID-19  declaration under Section 56 4(b)(1) of the Act, 21 U.S.C. section 360bbb-3(b)(1), unless the authorization is terminated or revoked sooner. Performed at [facility], [place] [address] [place], [place] [address]   MRSA PCR Screening     Status: None   Collection Time: 02/05/19  5:36 PM   Specimen: Nasal Mucosa; Nasopharyngeal  Result Value Ref Range Status   MRSA by PCR NEGATIVE NEGATIVE Final    Comment:        The GeneXpert MRSA Assay (FDA approved for NASAL specimens only), is one component of a comprehensive MRSA colonization surveillance program. It is not intended to diagnose MRSA infection nor to guide or monitor treatment for MRSA infections. Performed at [facility], [place] [address], [place], [place] [address]   Urine culture     Status: None   Collection Time: 02/06/19  2:35 PM   Specimen: Urine, Suprapubic  Result Value Ref Range Status   Specimen Description   Final    URINE, SUPRAPUBIC Performed at [place] [address], [place], [place] [address]    Special Requests   Final    NONE Performed at [facility], [place] [address] [place], [place] [address]    Culture   Final    NO GROWTH Performed at [facility], [place] [address], [place], [place] [address]    Report Status 02/08/2019 FINAL  Final         Radiology Studies: No results found.      Scheduled Meds: . Chlorhexidine Gluconate Cloth  6 each Topical Daily  . enoxaparin (LOVENOX) injection  40 mg Subcutaneous Daily  . feeding supplement (ENSURE ENLIVE)  237 mL Oral BID BM  . feeding supplement  237 mL Oral BID BM  . ferrous sulfate  325 mg Oral Q breakfast  . metroNIDAZOLE  1 application Topical Daily  . midodrine  5 mg Oral TID WC  . multivitamin with minerals  1 tablet Oral Daily  . nutrition supplement (JUVEN)  1 packet Oral BID BM   Continuous Infusions: . ceFEPime (MAXIPIME) IV 2 g (02/08/19 0524)  .  lactated ringers 10 mL/hr at 02/07/19 2139     LOS: 4 days    Time spent: 25 mins,More than 50% of that time was spent in counseling and/or coordination of care.      Shelly Coss, MD [facility] Pager [phone]  If 7PM-7AM, please contact night-coverage [email or web address] Password [email or web address] 02/08/2019, 1:07 PM

## 2019-02-09 LAB — CULTURE, BLOOD (ROUTINE X 2)
Culture: NO GROWTH
Culture: NO GROWTH
Special Requests: ADEQUATE
Special Requests: ADEQUATE

## 2019-02-09 LAB — CBC WITH DIFFERENTIAL/PLATELET
Abs Immature Granulocytes: 0.17 10*3/uL — ABNORMAL HIGH (ref 0.00–0.07)
Basophils Absolute: 0.1 10*3/uL (ref 0.0–0.1)
Basophils Relative: 1 %
Eosinophils Absolute: 0.1 10*3/uL (ref 0.0–0.5)
Eosinophils Relative: 1 %
HCT: 26 % — ABNORMAL LOW (ref 39.0–52.0)
Hemoglobin: 7.8 g/dL — ABNORMAL LOW (ref 13.0–17.0)
Immature Granulocytes: 1 %
Lymphocytes Relative: 25 %
Lymphs Abs: 3 10*3/uL (ref 0.7–4.0)
MCH: 22.6 pg — ABNORMAL LOW (ref 26.0–34.0)
MCHC: 30 g/dL (ref 30.0–36.0)
MCV: 75.4 fL — ABNORMAL LOW (ref 80.0–100.0)
Monocytes Absolute: 1 10*3/uL (ref 0.1–1.0)
Monocytes Relative: 8 %
Neutro Abs: 7.9 10*3/uL — ABNORMAL HIGH (ref 1.7–7.7)
Neutrophils Relative %: 64 %
Platelets: 495 10*3/uL — ABNORMAL HIGH (ref 150–400)
RBC: 3.45 MIL/uL — ABNORMAL LOW (ref 4.22–5.81)
RDW: 24.6 % — ABNORMAL HIGH (ref 11.5–15.5)
WBC: 12.2 10*3/uL — ABNORMAL HIGH (ref 4.0–10.5)
nRBC: 0 % (ref 0.0–0.2)

## 2019-02-09 MED ORDER — FERROUS SULFATE 325 (65 FE) MG PO TABS: 325.0000 mg | ORAL_TABLET | Freq: Every day | ORAL | 1 refills | Status: DC

## 2019-02-09 MED ORDER — AMOXICILLIN-POT CLAVULANATE 875-125 MG PO TABS: 1.0000 | ORAL_TABLET | Freq: Two times a day (BID) | ORAL | 0 refills | Status: AC

## 2019-02-09 MED ORDER — MIDODRINE HCL 5 MG PO TABS: 5.0000 mg | ORAL_TABLET | Freq: Three times a day (TID) | ORAL | 0 refills | Status: DC

## 2019-02-09 NOTE — Discharge Summary (Signed)
Physician Discharge Summary  Dylan [name] [id number] DOB: 04-24-1970 DOA: 02/04/2019  PCP: [name], MD  Admit date: 02/04/2019 Discharge date: 02/09/2019  Admitted From: Home Disposition:  Home  Discharge Condition:Stable CODE STATUS:FULL Diet recommendation: Regular  Brief/Interim Summary:  Patient is a 48 year old male with history of metastatic anal cancer status post radiation, surgery who was referred to the emergency department by his oncologist for the evaluation of hypertension, infection of the anal cancer area, tachycardia.  Patient was found to be severely dehydrated, weak and was unable to ambulate.  No fever or chills at home but  complaint of night sweats and fatigue.  There was report of urine and feces coming out of his anal wound though he has colostomy and suprapubic catheter. On presentation he was hypotensive, tachycardic.  Septic shock was suspected.  Patient was started on broad-spectrum antibiotics.  Source could be infected anal wound versus UTI due to fistula.  Blood pressure did not improve with IV fluids so PCCM consulted and he was moved to ICU.  Wound care and general surgery also consulted.  He did not require  pressors but his blood pressure improved with pressure dose steroids, midodrine.  Currently he remains hemodynamically stable.  General surgery declared that he is not a candidate for any kind of intervention and recommended to follow-up with surgeons at [facility].  Oncology also recommended outpatient follow-up.  Wound care was continued here at hospital and we recommend to continue wound care at home.  He has already home health set up.  He is hemodynamically stable for discharge to home today on oral antibiotics:  Following problems were addressed during his hospitalization:  Severe sepsis/septic shock:   Afebrile.Source could be infected anal wound versus UTI due to fistula  Started on broad-spectrum antibiotics. He was on   Midodrine,  Florinef and hydrocortisone.  Urine culture showed multiple species.  Blood cultures negative so far.  I will discontinue Florinef and hydrocortisone and continue on Midodrin only.  Currently hemodynamically stable  Infected anal wound: Has necrotic, foul-smelling rectal/anal wound.  No fluid collection seen on CT scan.  General surgery consulted but as per them there is no in indication for surgical intervention.  Recommended continued wound care therapy . He had several surgeries done in the past in different hospitals. He is a status post colostomy and suprapubic catheter placement.Changed suprapubic catheter here.  Metastatic anal cancer: Status post radiation therapy, surgery.  Status post diverting colostomy on 01/17/2019 at [facility].  Also had suprapubic catheter placement.  He was complaining of a stool and bloody discharge from the wound.  He has history of noncompliance.  Follows with oncology.  Oncology planning for new PET/CT for restaging.  Oncology planning for initiating chemotherapy as an outpatient.  Microcytic anemia: Continue to monitor H&H. Hb dropped to 6.6 and he was  transfused with 2 units of  PRBC.  Iron panel showed severe iron deficiency .  S/p  IV iron.  Continue iron supplementation on discharge.  Goals of care /advanced metastatic cancer: Palliative care was following.  Remains full code.  [facility].  Debility/deconditioning: PT/OT following.    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  Do a CBC, BMP test during the follow-up 2)Take prescribed medications as instructed. 3) Follow up with your oncologist as an outpatient as soon as possible 4)Follow up with palliative care 5)Continue wound care   Increase activity slowly   Complete by: As directed      Allergies as of 02/09/2019   No Known Allergies     Medication List    TAKE these medications   acetaminophen 325 MG tablet Commonly known as: TYLENOL Take 650 mg by mouth every 6 (six) hours as needed for pain.   amoxicillin-clavulanate 875-125 MG tablet Commonly known as: Augmentin Take 1 tablet by mouth 2 (two) times daily for 7 days.   enoxaparin 40 MG/0.4ML injection Commonly known as: LOVENOX Inject 40 mg into the skin daily.   feeding supplement (ENSURE ENLIVE) Liqd Take 237 mLs by mouth 2 (two) times daily between meals.   ferrous sulfate 325 (65 FE) MG tablet Take 1 tablet (325 mg total) by mouth daily with breakfast.   gabapentin 100 MG capsule Commonly known as: NEURONTIN Take 100 mg by mouth at bedtime as needed for sleep.   MELATONIN PO Take 1 capsule by mouth at bedtime as needed (sleep).   metroNIDAZOLE 0.75 % gel Commonly known as: METROGEL Apply 1 application topically daily.   midodrine 5 MG tablet Commonly known as: PROAMATINE Take 1 tablet (5 mg total) by mouth 3 (three) times daily with meals.   Oxycodone HCl 20 MG Tabs Take 20-30 mg by mouth See admin instructions. Take 1 tablet in the morning, take 1.5 tablets at lunch, and take 1 tablet at night   senna-docusate 8.6-50 MG tablet Commonly known as: Senokot-S Take 1 tablet by mouth daily as needed for mild constipation.      Follow-up Information     [name], MD. Schedule an appointment as soon as possible for a visit in 1 week(s).   Specialties: Family Medicine, Sports Medicine Contact information: [place] [id number] [phone]          No Known Allergies  Consultations: Oncology, PCCM, general surgery  Procedures/Studies: CT ABDOMEN PELVIS W CONTRAST  Result Date: 02/04/2019 CLINICAL DATA:  Hypotension and tachycardia. Severe rectal pain. Status post anal and scrotal cancer resection 3 weeks ago with a diverting ostomy. EXAM: CT ABDOMEN AND PELVIS WITH CONTRAST TECHNIQUE: Multidetector CT imaging of the abdomen and pelvis was performed using the standard protocol following bolus administration of intravenous contrast. CONTRAST:  176mL OMNIPAQUE IOHEXOL 300 MG/ML  SOLN COMPARISON:  10/18/2018. FINDINGS: Lower chest: Multiple interval small nodules in the right lower lobe and right middle lobe. The largest is in the right lower lobe, measuring 6 mm in maximum diameter on image number 13 series 4. Normal sized heart. Hepatobiliary: No focal liver abnormality is seen. No gallstones, gallbladder wall thickening, or biliary dilatation. Pancreas: Unremarkable. No pancreatic ductal dilatation or surrounding inflammatory changes. Spleen: Normal in size without focal abnormality. Adrenals/Urinary Tract: Interval suprapubic Foley catheter  in the urinary bladder with interval moderate to marked diffuse low density bladder wall thickening, mucosal enhancement and surrounding soft tissue stranding. Unremarkable adrenal glands, kidneys and ureters. Stomach/Bowel: Unremarkable stomach, small bowel and colon with an interval left lower quadrant ostomy. Vascular/Lymphatic: Mild atheromatous arterial calcifications without aneurysm. The previously described mildly prominent, enhancing, ill-defined right external iliac and distal common iliac artery lymph nodes are again demonstrated. A proximal right external iliac node has a  short axis diameter of 6 mm on image number 59 series 2, previously 8 mm. Reproductive: Moderately enlarged and heterogeneous prostate gland with a mild increase in low density heterogeneity, contiguous with the thickened bladder wall posteriorly. This is also contiguous with a mass protruding into the previously demonstrated open cavity in the perineal region. Inferiorly, this mass measures 3.3 x 2.6 cm on image number 75 series 2. A moderate-sized right hydrocele has not changed significantly. Other: There is an interval increase in size of a soft tissue mass at the inferior aspect of the large cavity in the perineal region, at the level of the proximal right thigh. This mass measures 4.8 x 2.6 cm on image number 92 series 2, previously approximately 2.4 x 1.6 cm. Musculoskeletal: Stable ill-defined curvilinear density in the L3 vertebral body and patchy increased and decreased density in the right iliac bone. Multiple small bilateral femoral head cysts are again noted. IMPRESSION: 1. Interval suprapubic Foley catheter with interval moderate to marked diffuse low density bladder wall thickening, mucosal enhancement and surrounding soft tissue stranding, compatible with marked cystitis. 2. Interval increase in size of a soft tissue mass in the inferior aspect of the large cavity in the perineal region, at the level of the proximal right thigh, compatible with the patient's known malignancy. 3. No significant change in an additional mass in the superior aspect of the large cavity, compatible with additional known malignancy. 4. Interval multiple small nodules in the right lower lobe and right middle lobe, suspicious for metastases. 5. Mildly improved probable right common and external iliac metastatic adenopathy. 6. Stable ill-defined curvilinear density in the L3 vertebral body and patchy increased and decreased density in the right iliac bone. These could represent treated metastases. 7. Stable moderate-sized  right hydrocele. Electronically Signed   By: Claudie Revering M.D.   On: 02/04/2019 19:25   DG Chest Portable 1 View  Result Date: 02/04/2019 CLINICAL DATA:  Sepsis. Hypotension. Recently resected anal cancer. EXAM: PORTABLE CHEST 1 VIEW COMPARISON:  11/07/2018. FINDINGS: Two small, nodular densities are again projected over the right upper lung zone and are not within ribs. Otherwise, clear lungs. Normal sized heart. Mild lower thoracic spine degenerative changes. IMPRESSION: 1. Stable small, right upper lobe nodules. Again, further evaluation with a chest CT with contrast is recommended. 2. No acute abnormality. Electronically Signed   By: Claudie Revering M.D.   On: 02/04/2019 18:11       Subjective: Patient seen and examined at the bedside this morning.  Hemodynamically stable for discharge today.  Discharge Exam: Vitals:   02/09/19 0830 02/09/19 1021  BP: 102/66 (!) 100/56  Pulse: (!) 57 67  Resp: 15 14  Temp: 97.9 F (36.6 C) 97.8 F (36.6 C)  SpO2: 98% 99%   Vitals:   02/09/19 0626 02/09/19 0635 02/09/19 0830 02/09/19 1021  BP: 97/61  102/66 (!) 100/56  Pulse: 62  (!) 57 67  Resp: 12 15 15 14   Temp: 97.7 F (36.5 C)  97.9 F (36.6 C) 97.8 F (36.6 C)  TempSrc: Oral  Oral Oral  SpO2: 97%  98% 99%  Weight:      Height:        General: Pt is alert, awake, not in acute distress Cardiovascular: RRR, S1/S2 +, no rubs, no gallops Respiratory: CTA bilaterally, no wheezing, no rhonchi Abdominal: Soft, NT, ND, bowel sounds + Extremities: no edema, no cyanosis    The results of significant diagnostics from this hospitalization (including imaging, microbiology, ancillary and laboratory) are listed below for reference.     Microbiology: Recent Results (from the past 240 hour(s))  Culture, blood (Routine x 2)     Status: None   Collection Time: 02/04/19  5:29 PM   Specimen: BLOOD  Result Value Ref Range Status   Specimen Description   Final    BLOOD LEFT  ANTECUBITAL Performed at [facility], [place] [address], [place], [place] [address]    Special Requests   Final    BOTTLES DRAWN AEROBIC AND ANAEROBIC Blood Culture adequate volume Performed at [place] [address] [place], [place] [address]    Culture   Final    NO GROWTH 5 DAYS Performed at [facility], [place] [address], [place], [place] [address]    Report Status 02/09/2019 FINAL  Final  Culture, blood (Routine x 2)     Status: None   Collection Time: 02/04/19  5:29 PM   Specimen: BLOOD  Result Value Ref Range Status   Specimen Description   Final    BLOOD RIGHT WRIST Performed at [facility] [address], [place], [place] [address]    Special Requests   Final    BOTTLES DRAWN AEROBIC AND ANAEROBIC Blood Culture adequate volume Performed at [place] [address] [place], [place] [address]    Culture   Final    NO GROWTH 5 DAYS Performed at [facility], [place] [address], [place], [place] [address]    Report Status 02/09/2019 FINAL  Final  Urine culture     Status: Abnormal   Collection Time: 02/04/19  8:30 PM   Specimen: In/Out Cath Urine  Result Value Ref Range Status   Specimen Description   Final    IN/OUT CATH URINE Performed at [place] [address], [place], [place] [address]    Special Requests   Final    NONE Performed at [facility], [place] [address], [place],  [address]    Culture MULTIPLE SPECIES PRESENT, SUGGEST RECOLLECTION (A)  Final   Report Status 02/07/2019 FINAL  Final  SARS CORONAVIRUS 2 (TAT 6-24 HRS) Nasopharyngeal Nasopharyngeal Swab     Status: None   Collection Time: 02/04/19  9:36 PM   Specimen: Nasopharyngeal Swab  Result Value Ref Range Status   SARS Coronavirus 2 NEGATIVE NEGATIVE Final    Comment: (NOTE) SARS-CoV-2 target nucleic acids are NOT DETECTED. The SARS-CoV-2 RNA is generally detectable  in upper and lower respiratory specimens during the acute phase of infection. Negative results do not preclude SARS-CoV-2 infection, do not rule out co-infections with other pathogens, and should not be used as the sole basis for treatment or other patient management decisions. Negative results must be combined with clinical observations, patient history, and epidemiological information. The expected result is Negative. Fact Sheet for Patients: SugarRoll.be Fact Sheet for Healthcare Providers: https://www.woods-mathews.com/ This test is not yet approved or cleared by the [place] FDA and  has been authorized for  detection and/or diagnosis of SARS-CoV-2 by FDA under an Emergency Use Authorization (EUA). This EUA will remain  in effect (meaning this test can be used) for the duration of the COVID-19 declaration under Section 56 4(b)(1) of the Act, 21 U.S.C. section 360bbb-3(b)(1), unless the authorization is terminated or revoked sooner. Performed at [facility], [place] [address], [place], [place] [address]   MRSA PCR Screening     Status: None   Collection Time: 02/05/19  5:36 PM   Specimen: Nasal Mucosa; Nasopharyngeal  Result Value Ref Range Status   MRSA by PCR NEGATIVE NEGATIVE Final    Comment:        The GeneXpert MRSA Assay (FDA approved for NASAL specimens only), is one component of a comprehensive MRSA colonization surveillance program. It is not intended to diagnose MRSA infection nor to guide or monitor treatment for MRSA infections. Performed at [facility], [place] [address] [place], [place] [address]   Urine culture     Status: None   Collection Time: 02/06/19  2:35 PM   Specimen: Urine, Suprapubic  Result Value Ref Range Status   Specimen Description   Final    URINE, SUPRAPUBIC Performed at [place] [address] [place], [place] [address]    Special Requests    Final    NONE Performed at [facility], [place] [address] [place], [place] [address]    Culture   Final    NO GROWTH Performed at [facility], [place] [address], [place], [place] [address]    Report Status 02/08/2019 FINAL  Final     Labs: BNP (last 3 results) No results for input(s): BNP in the last 8760 hours. Basic Metabolic Panel: Recent Labs  Lab 02/04/19 1527 02/04/19 1729 02/05/19 0455 02/06/19 1219 02/07/19 0147 02/08/19 0547  NA 134* 137 134* 135  --  138  K 4.0 4.2 4.3 3.7  --  4.2  CL 101 102 105 103  --  106  CO2 21* 26 24 24   --  26  GLUCOSE 97 98 109* 107*  --  105*  BUN 9 10 9  5*  --  12  CREATININE 0.78 0.86 0.75 0.68  --  0.63  CALCIUM 9.5 10.2 8.9 8.9  --  8.7*  MG  --   --   --   --  1.8  --    Liver Function Tests: Recent Labs  Lab 02/04/19 1527 02/04/19 1729 02/05/19 0455 02/06/19 1219  AST 16 15 10* 11*  ALT 18 19 15 12   ALKPHOS 102 107 70 69  BILITOT 0.2* 0.6 0.4 0.5  PROT 8.7* 8.5* 6.9 7.2  ALBUMIN 2.3* 2.6* 2.0* 2.2*   No results for input(s): LIPASE, AMYLASE in the last 168 hours. No results for input(s): AMMONIA in the last 168 hours. CBC: Recent Labs  Lab 02/04/19 1729 02/05/19 0455 02/05/19 1804 02/07/19 0147 02/08/19 0547 02/09/19 0506  WBC 14.1* 10.6* 9.9 9.1 10.8* 12.2*  NEUTROABS 11.2* 7.9*  --  7.8* 8.6* 7.9*  HGB 8.1* 6.6* 9.0* 7.9* 7.4* 7.8*  HCT 27.3* 22.2* 29.7* 26.1* 24.7* 26.0*  MCV 71.3* 72.1* 75.8* 73.7* 74.6* 75.4*  PLT 506* 430* 461* 459* 461* 495*   Cardiac Enzymes: No results for input(s): CKTOTAL, CKMB, CKMBINDEX, TROPONINI in the last 168 hours. BNP: Invalid input(s): POCBNP CBG: No results for input(s): GLUCAP in the last 168 hours. D-Dimer No results for input(s): DDIMER in the last 72  hours. Hgb A1c No results for input(s): HGBA1C in the last 72 hours. Lipid Profile No results for input(s): CHOL, HDL, LDLCALC, TRIG, CHOLHDL, LDLDIRECT in the last 72 hours. Thyroid  function studies No results for input(s): TSH, T4TOTAL, T3FREE, THYROIDAB in the last 72 hours.  Invalid input(s): FREET3 Anemia work up No results for input(s): VITAMINB12, FOLATE, FERRITIN, TIBC, IRON, RETICCTPCT in the last 72 hours. Urinalysis    Component Value Date/Time   COLORURINE YELLOW 02/04/2019 2030   APPEARANCEUR TURBID (A) 02/04/2019 2030   LABSPEC 1.020 02/04/2019 2030   PHURINE 6.0 02/04/2019 2030   GLUCOSEU NEGATIVE 02/04/2019 2030   [place] (A) 02/04/2019 2030   [place] NEGATIVE 02/04/2019 2030   [place] 02/04/2019 2030   PROTEINUR >=300 (A) 02/04/2019 2030   NITRITE NEGATIVE 02/04/2019 2030   LEUKOCYTESUR LARGE (A) 02/04/2019 2030   Sepsis Labs Invalid input(s): PROCALCITONIN,  WBC,  LACTICIDVEN Microbiology Recent Results (from the past 240 hour(s))  Culture, blood (Routine x 2)     Status: None   Collection Time: 02/04/19  5:29 PM   Specimen: BLOOD  Result Value Ref Range Status   Specimen Description   Final    BLOOD LEFT ANTECUBITAL Performed at [facility], [place] [address] [place], [place] [address]    Special Requests   Final    BOTTLES DRAWN AEROBIC AND ANAEROBIC Blood Culture adequate volume Performed at [facility] [address] [place], [place] [address]    Culture   Final    NO GROWTH 5 DAYS Performed at [facility], [place] [address] [place], [place] [address]    Report Status 02/09/2019 FINAL  Final  Culture, blood (Routine x 2)     Status: None   Collection Time: 02/04/19  5:29 PM   Specimen: BLOOD  Result Value Ref Range Status   Specimen Description   Final    BLOOD RIGHT WRIST Performed at [facility] [address], [place], [place] [address]    Special Requests   Final    BOTTLES DRAWN AEROBIC AND ANAEROBIC Blood Culture adequate volume Performed at [place] [address] [place], [place] [address]    Culture   Final    NO  GROWTH 5 DAYS Performed at [facility], [place] [address] [place], [place] [address]    Report Status 02/09/2019 FINAL  Final  Urine culture     Status: Abnormal   Collection Time: 02/04/19  8:30 PM   Specimen: In/Out Cath Urine  Result Value Ref Range Status   Specimen Description   Final    IN/OUT CATH URINE Performed at [place] [facility] [address] [place], [place] [address]    Special Requests   Final    NONE Performed at [facility], [place] [address] [place], [place] [address]    Culture MULTIPLE SPECIES PRESENT, SUGGEST RECOLLECTION (A)  Final   Report Status 02/07/2019 FINAL  Final  SARS CORONAVIRUS 2 (TAT 6-24 HRS) Nasopharyngeal Nasopharyngeal Swab     Status: None   Collection Time: 02/04/19  9:36 PM   Specimen: Nasopharyngeal Swab  Result Value Ref Range Status   SARS Coronavirus 2 NEGATIVE NEGATIVE Final    Comment: (NOTE) SARS-CoV-2 target nucleic acids are NOT DETECTED. The SARS-CoV-2 RNA is generally detectable in upper and lower respiratory specimens during the acute phase of infection. Negative results do not preclude SARS-CoV-2 infection, do not rule out  co-infections with other pathogens, and should not be used as the sole basis for treatment or other patient management decisions. Negative results must be combined with clinical observations, patient history, and epidemiological information. The expected result is Negative. Fact Sheet for Patients: SugarRoll.be Fact Sheet for Healthcare Providers: https://www.woods-mathews.com/ This test is not yet approved or cleared by the [place] FDA and  has been authorized for detection and/or diagnosis of SARS-CoV-2 by FDA under an Emergency Use Authorization (EUA). This EUA will remain  in effect (meaning this test can be used) for the duration of the COVID-19 declaration under Section 56 4(b)(1) of the Act, 21 U.S.C. section  360bbb-3(b)(1), unless the authorization is terminated or revoked sooner. Performed at [facility], [place] [address], [place], [place] [address]   MRSA PCR Screening     Status: None   Collection Time: 02/05/19  5:36 PM   Specimen: Nasal Mucosa; Nasopharyngeal  Result Value Ref Range Status   MRSA by PCR NEGATIVE NEGATIVE Final    Comment:        The GeneXpert MRSA Assay (FDA approved for NASAL specimens only), is one component of a comprehensive MRSA colonization surveillance program. It is not intended to diagnose MRSA infection nor to guide or monitor treatment for MRSA infections. Performed at [facility], [place] [address], [place], [place] [address]   Urine culture     Status: None   Collection Time: 02/06/19  2:35 PM   Specimen: Urine, Suprapubic  Result Value Ref Range Status   Specimen Description   Final    URINE, SUPRAPUBIC Performed at [facility] [address], [place], [place] [address]    Special Requests   Final    NONE Performed at [facility], [place] [address], [place], [place] [address]    Culture   Final    NO GROWTH Performed at [facility], [place] [address], [place], [place] [address]    Report Status 02/08/2019 FINAL  Final    Please note: You were cared for by a hospitalist during your hospital stay. Once you are discharged, your primary care physician will handle any further medical issues. Please note that NO REFILLS for any discharge medications will be authorized once you are discharged, as it is imperative that you return to your primary care physician (or establish a relationship with a primary care physician if you do not have one) for your post hospital discharge needs so that they can reassess your need for medications and monitor your lab values.    Time coordinating discharge: 40 minutes  SIGNED:   Shelly Coss, MD  [facility] 02/09/2019, 12:08 PM Pager  [phone]  If 7PM-7AM, please contact night-coverage [email or web address] Password [email or web address]

## 2019-02-09 NOTE — Progress Notes (Signed)
Pt discharged home in stable condition. Discharge instructions given. Scripts sent to pharmacy of choice. No immediate questions or concerns at this time. Dressing changed prior to discharge. Pt discharged from unit via wheelchair.

## 2019-02-09 NOTE — Progress Notes (Signed)
Notified M. Sharlet [place] of patient's MEWS yellow status. BP is near pt baseline, as he received pain medication recently.

## 2019-02-09 NOTE — Progress Notes (Signed)
Physical Therapy Treatment Patient Details Name: Dylan [name] MRN: [id number] DOB: [birth year] Today's Date: 02/09/2019    History of Present Illness Pt is a 48 year old male admitted for severe sepsis/septic shock in the setting of probable acute cystitis/urinary tract infection complicated further by relative adrenal dysfxn and chronic nonhealing anal/sacral wound.  PMHx significant for non Hodgkin's lymphoma, rectal cancer metastatic to lung    PT Comments    Pt agreeable to mobilize in hallway today, although pt requires increased time and verbal encouragement to participate. Pt ambulated ~45 ft with RW with x1 standing rest break to recover fatigue. Pt with no evidence of LOB in standing, pt somewhat self-limiting during mobility. PT continuing to recommend HHPT and assist from pt's daughter upon d/c home. Will continue to follow acutely.    Follow Up Recommendations  Home health PT     Equipment Recommendations  Rolling walker with 5" wheels    Recommendations for Other Services       Precautions / Restrictions Precautions Precautions: Fall Precaution Comments: colostomy and suprapubic catheter, infected anal and sacral wound Restrictions Weight Bearing Restrictions: No    Mobility  Bed Mobility Overal bed mobility: Needs Assistance Bed Mobility: Supine to Sit     Supine to sit: Supervision;HOB elevated     General bed mobility comments: supervision for safety, increased time to mobilize. Upon sitting EOB, pt with increased time to transition to standing due to dizziness.  Transfers Overall transfer level: Needs assistance Equipment used: Rolling walker (2 wheeled) Transfers: Sit to/from Stand Sit to Stand: Min guard         General transfer comment: min guard for safety only, no LOB when transfer to standing or upon standing.  Ambulation/Gait Ambulation/Gait assistance: Min Gaffer (Feet): 45 Feet Assistive device: Rolling walker  (2 wheeled) Gait Pattern/deviations: Step-through pattern;Decreased stride length;Trunk flexed Gait velocity: decr   General Gait Details: Min guard to supervision for safety, slow and steady gait with use of RW. Pt with 1 standing rest break to recover fatigue.   Stairs             Wheelchair Mobility    Modified Rankin (Stroke Patients Only)       Balance Overall balance assessment: Mild deficits observed, not formally tested                                          Cognition Arousal/Alertness: Awake/alert Behavior During Therapy: WFL for tasks assessed/performed Overall Cognitive Status: Within Functional Limits for tasks assessed                                        Exercises General Exercises - Lower Extremity Long Arc Quad: AROM;Both;5 reps;Seated    General Comments        Pertinent Vitals/Pain Pain Assessment: No/denies pain    Home Living                      Prior Function            PT Goals (current goals can now be found in the care plan section) Acute Rehab PT Goals Patient Stated Goal: regain strength PT Goal Formulation: With patient Time For Goal Achievement: 02/21/19 Potential to Achieve Goals: Good Progress towards PT  goals: Progressing toward goals    Frequency    Min 3X/week      PT Plan Current plan remains appropriate    Co-evaluation              AM-PAC PT "6 Clicks" Mobility   Outcome Measure  Help needed turning from your back to your side while in a flat bed without using bedrails?: A Little Help needed moving from lying on your back to sitting on the side of a flat bed without using bedrails?: A Little Help needed moving to and from a bed to a chair (including a wheelchair)?: A Little Help needed standing up from a chair using your arms (e.g., wheelchair or bedside chair)?: A Little Help needed to walk in hospital room?: A Little Help needed climbing 3-5 steps  with a railing? : A Lot 6 Click Score: 17    End of Session   Activity Tolerance: Patient tolerated treatment well;Patient limited by fatigue Patient left: with call bell/phone within reach;in chair(pt verbalizes he will press call button and wait for assist prior to mobilizing to bed) Nurse Communication: Mobility status PT Visit Diagnosis: Difficulty in walking, not elsewhere classified (R26.2);Muscle weakness (generalized) (M62.81)     Time: [id number] PT Time Calculation (min) (ACUTE ONLY): 20 min  Charges:  $Gait Training: 8-22 mins                    Enslee Bibbins E, PT [place] Pager [phone]  Office [phone]   Jaxten Brosh D [place] 02/09/2019, 11:11 AM

## 2019-02-09 NOTE — TOC Initial Note (Signed)
Transition of Care [facility]) - Initial/Assessment Note    Patient Details  Name: Dylan [name] MRN: [id number] Date of Birth: 02/11/1971  [facility] CM/SW Contact:    Dylan Cha, RN Phone Number: 02/09/2019, 12:21 PM  Clinical Narrative:                 [facility] and is an established patient with them.  Dylan [name] notified.  Expected Discharge Plan: [place] Barriers to Discharge: No Barriers Identified   Patient Goals and CMS Choice Patient states their goals for this hospitalization and ongoing recovery are:: just to go home and stay there CMS Medicare.gov Compare Post Acute Care list provided to:: Patient Choice offered to / list presented to : Patient  Expected Discharge Plan and Services Expected Discharge Plan: [place]   Discharge Planning Services: CM Consult Post Acute Care Choice: [place] arrangements for the past 2 months: Single Family Home Expected Discharge Date: 02/09/19                         HH Arranged: RN, PT, OT, Social Work [employer] Agency: [place] (Dylan [name]) Date [place]: 02/09/19 Time [place]: 1220 Representative spoke with at [place]: Dylan [name]  Prior Living Arrangements/Services Living arrangements for the past 2 months: [place] Lives with:: Spouse Patient language and need for interpreter reviewed:: No Do you feel safe going back to the place where you live?: Yes      Need for Family Participation in Patient Care: Yes (Comment) Care giver support system in place?: Yes (comment) Current home services: Home OT, Home PT, Home RN Criminal Activity/Legal Involvement Pertinent to Current Situation/Hospitalization: No - Comment as needed  Activities of Daily Living Home Assistive Devices/Equipment: Ostomy supplies ADL Screening (condition at time of admission) Patient's cognitive ability adequate to safely complete daily  activities?: Yes Is the patient deaf or have difficulty hearing?: No Does the patient have difficulty seeing, even when wearing glasses/contacts?: No Does the patient have difficulty concentrating, remembering, or making decisions?: No Patient able to express need for assistance with ADLs?: Yes Does the patient have difficulty dressing or bathing?: No Independently performs ADLs?: No(increased weakness) Communication: Independent Dressing (OT): Independent Grooming: Independent Feeding: Independent Bathing: Independent Toileting: Needs assistance Is this a change from baseline?: Change from baseline, expected to last >3days In/Out Bed: Needs assistance Is this a change from baseline?: Change from baseline, expected to last >3 days Walks in Home: Needs assistance Is this a change from baseline?: Change from baseline, expected to last >3 days Does the patient have difficulty walking or climbing stairs?: Yes(secondary to weakness) Weakness of Legs: Both Weakness of Arms/Hands: None  Permission Sought/Granted Permission sought to share information with : Case Manager Permission granted to share information with : Yes, Verbal Permission Granted     Permission granted to share info w AGENCY: [facility]        Emotional Assessment Appearance:: Appears stated age Attitude/Demeanor/Rapport: Engaged Affect (typically observed): Calm Orientation: : Oriented to Self, Oriented to Place, Oriented to  Time, Oriented to Situation Alcohol / Substance Use: Not Applicable Psych Involvement: No (comment)  Admission diagnosis:  Dehydration [E86.0] Septic shock ([place]) [A41.9, R65.21] Sepsis due to undetermined organism ([place]) [A41.9] Hypotension, unspecified hypotension type [I95.9] Sepsis, due to unspecified organism, unspecified whether acute organ dysfunction present [facility]) [A41.9] Patient Active Problem List   Diagnosis Date  Noted  . Cancer cachexia ([place]) 02/06/2019  . Perforation of rectum  from anal cancer s/p diverting colostomy 01/16/2019 02/06/2019  . Anal cellulitis 02/05/2019  . Septic shock ([place]) 02/05/2019  . 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 Colostomy - diverting loop colostomy in place Dec 2020 02/05/2019  . Suprapubic tube in place Dec 2020 for rectourethral fistula 02/05/2019  . Sepsis due to undetermined organism ([place]) 02/04/2019  . Microcytic anemia 02/04/2019  . Sepsis secondary to UTI ([place]) 02/04/2019  . Malnutrition of moderate degree 10/20/2018  . Hypotension 10/18/2018  . Tachycardia 10/18/2018  . Anemia 10/18/2018  . Iron deficiency 10/18/2018  . Sepsis ([place]) 10/18/2018  . Cancer associated pain 02/23/2017  . Metastatic anal cancer 02/10/2014   PCP:  Dylan Agreste, MD Pharmacy:   [id number] - [place], [place] [address] Phone: [phone] Fax: [phone]  [id number] - [place], [place] [address] Phone: [phone] Fax: [phone]     Social Determinants of Health (SDOH) Interventions    Readmission Risk Interventions No flowsheet data found.

## 2019-02-20 ENCOUNTER — Telehealth: Payer: Self-pay | Admitting: Family Medicine

## 2019-02-20 NOTE — Telephone Encounter (Signed)
Sharyn Lull, from [facility], calling to recert pt for Nursing for 1x for 9wks. Please advise.     [phone] secure line.

## 2019-02-21 NOTE — Telephone Encounter (Signed)
Please Advise

## 2019-02-21 NOTE — Telephone Encounter (Signed)
[place] for verbal order to recert home ursing. Please also schedule virtual visit with me as I have not evaluated him recently.

## 2019-02-22 ENCOUNTER — Telehealth: Payer: Self-pay | Admitting: Family Medicine

## 2019-02-22 NOTE — Telephone Encounter (Signed)
Please schedule patient an virtual visit with Dr Carlota Raspberry to be evaluated.  Home health has been notified it was ok for home health

## 2019-02-22 NOTE — Telephone Encounter (Signed)
Called pt per DPR no voice mail setup/ unable to leave meassage to schedule for virtual with Dr.Greene

## 2019-02-22 NOTE — Telephone Encounter (Signed)
Sharyn Lull with [facility] calling for verbal Skilled nursing orders for  1 wk 9 cb  [phone] This is secure VM

## 2019-02-25 NOTE — Telephone Encounter (Signed)
Verbal orders has been given. Also sent for scheduling so we can get Dylan [name] an office visit or a tele-med visit with Dr Carlota Raspberry so we can f/u with Dylan [name].   Scheduling please get Dylan [name] scheduled for a f/u visit with Dr Carlota Raspberry to be evaluated for skilled nursing.

## 2019-02-28 ENCOUNTER — Other Ambulatory Visit: Payer: Self-pay

## 2019-02-28 ENCOUNTER — Other Ambulatory Visit: Payer: Medicare Other | Admitting: Internal Medicine

## 2019-02-28 ENCOUNTER — Encounter: Payer: Self-pay | Admitting: Internal Medicine

## 2019-02-28 DIAGNOSIS — Z7189 Other specified counseling: Secondary | ICD-10-CM

## 2019-02-28 DIAGNOSIS — Z515 Encounter for palliative care: Secondary | ICD-10-CM

## 2019-02-28 NOTE — Progress Notes (Signed)
Jan 14th, 2021 [facility] Palliative Care Consult Note Telephone: [phone]  Fax: [phone]     Due to the current COVID-19 infection/crises, the family prefer, and have given their verbal consent for, a provider visit via telemedicine. HIPPA policies of confidentially were discussed. Video-audio (telehealth) contact was unable to be done due technical barriers from the patient's side.   PATIENT NAME: Dylan [name] DOB: [birth year] MRN: [id number] [place] [id number] [phone] [email or web address]   PRIMARY CARE PROVIDER:   [name], MD [address] [place],  [place] [address]   REFERRING PROVIDER:   Fredric Dine, NP [place] [facility] Provider: Dr. Randon Goldsmith, Dr. Marena Chancy   RESPONSIBLE PARTY: (dtr/agent) Dylan [name] [phone]. (dtr) Dylan [name] ([phone]. (dtr) Dylan [name] [phone]. (dtr) Dylan [name] / RECOMMENDATIONS:  1. Advance Care Planning:  A. Directives: Patient desires full resuscitative efforts in the event of a cardiopulmonary arrest.   B. Goals of Care: patient is s/p placement of a suprapubic catheter and diverting colostomy (early December 2020), to prevent fecal matter from contaminating his peri rectal wound. Around Christmas he was hospitalized for sepsis/septic shock. He's making a slow but steady recovery; awaiting appointment with his oncologist Jan 18th, to discuss further therapeutic options.   2. Symptom Management:  A. Pain (peri-rectal): States peri rectal pain well managed with oxycodone 20mg  tid. Occasional use of prn Tylenol for breakthrough pain. Denies opioid related side effects. Takes a senna tab qod; stool from colostomy bag soft consistency   B. Weight loss: Patient is unsure of what his current weight is as he doesn't have a scale. But has a good appetite and eats 3 meals a day with 2 ensure/day. Denies nausea. He believes he has  gained weight above his 135 lbs 6 weeks earlier. At that time, at a height of 6'1" his BMI was 18.5 kg/m2.    3. Cognitive / Functional status: Ambulating about with use of walker for stability. Independent in transfers, hygiene, and dressing. Patient "takes it easy". Finds himself easily fatigues after standing for 5-10 minutes. Denies dizziness/lightheadedness. He changes his own peri rectal dressings tid, with gauze and a medicated ointment. States continued daily bleeding from rectal site "a napkin's worth". Patient is s/p tx of 2 units PRBC's  12/28 for a Hgb of 7.8 at that time. No stool per rectum, though this was an initial problem. Patient is looking for home SN to start (dressing changes), upon referral from PCP. Delayed till patient visit with Dr. Nyoka Cowden for a face to face telehealth. Dr. Rolly Salter office has reached out to arrange a visit, and patient plans to call to schedule. Patient manages his own colostomy bag and suprapubic tube. He reports both sites healed and without wound drainage or signs of infection/inflammation. Supplies are mailed to him.   4. Family Supports / coping setting of life-threatening illness: Continue to reside with his daughter Dylan [name] and her 3 sons and one daughter (ages 66-7). Patient greatly enjoys his grandchildren and they are a big reason why he wishes to pursue treatment; wants to see them grow up. Mentions he copes by taking "one day at a time" Patient has another daughter/HPOA Dylan [name] who usually is the child who accompanies patient to his doctor's appointments.    5. Follow up Palliative Care Visit: 04/30/2019 @ 2pm Telehealth/telephone call. This is not a scheduled visit. Patient wants me to call and try to catch  him I spent 60 minutes providing this consultation from 4 to 5pm. More than 50% of the time in this consultation was spent coordinating communication.    HISTORY OF PRESENT ILLNESS:  Dylan [name] is a 49 y.o. male with history of Hodgkin's  lymphoma (1999; 1 yr chemo), perianal condyloma, and squamous cell carcinoma of the anus who has been lost to follow up for ~1.5 years, admitted to [facility] on 11/18/2018 with several months of bleeding from his perineum and marked progression of disease. Afebrile, tachycardic on admission, anemic to 7.3. Hospitalized 10/4-10/08/2018: Chest CT: worsening lung nodules c/w metastatic disease. MRI pelvis: locally invasive dx. Not a surgical cal. Rad Onc recommended against further radiation with possible tumor embolization with VIR in future. Med oncology recommended PET scan OP and chemo. Tx 2 u's PRBCs of Hbg 7.3 01/04/19: Tx'd 2 units PRBSs. Plan a diverting ileostomy  to decreased peri-rectal wound contamination, then chemo 12/1-12/5/20202: Diverting colostomy and suprapubic tube (for retourethral fistula from invading anal cancer) At [facility]. 12/21-12/26/2020: hospitalization sepsis/septic shock, anemia (tx's 2 u's PRBCs), cancer cachexia This is a f/u Palliative Care Visit from Nov 20th.    CODE STATUS: Full Code   PPS: 50% HOSPICE ELIGIBILITY/DIAGNOSIS: TBD  PAST MEDICAL HISTORY:  Past Medical History:  Diagnosis Date  . Cancer ([place]) 2018   anal cancer  . Non Hodgkin's lymphoma ([place]) 1999  . S/P radiation therapy 07/07/14-7/20/216   anal ca     SOCIAL HX:  Social History   Tobacco Use  . Smoking status: Current Some Day Smoker    Packs/day: 0.30    Types: Cigarettes  . Smokeless tobacco: Current User  Substance Use Topics  . Alcohol use: No    ALLERGIES: No Known Allergies   PERTINENT MEDICATIONS:  Outpatient Encounter Medications as of 02/28/2019  Medication Sig  . acetaminophen (TYLENOL) 325 MG tablet Take 650 mg by mouth every 6 (six) hours as needed for pain.  . feeding supplement, ENSURE ENLIVE, (ENSURE ENLIVE) LIQD Take 237 mLs by mouth 2 (two) times daily between meals.  . ferrous sulfate 325 (65 FE) MG tablet Take 1 tablet (325 mg total) by mouth daily with  breakfast.  . Oxycodone HCl 20 MG TABS Take 20-30 mg by mouth See admin instructions. Take 1 tablet in the morning, take 1.5 tablets at lunch, and take 1 tablet at night  . senna-docusate (SENOKOT-S) 8.6-50 MG tablet Take 1 tablet by mouth daily as needed for mild constipation.   . enoxaparin (LOVENOX) 40 MG/0.4ML injection Inject 40 mg into the skin daily.  Marland Kitchen gabapentin (NEURONTIN) 100 MG capsule Take 100 mg by mouth at bedtime as needed for sleep.  Marland Kitchen MELATONIN PO Take 1 capsule by mouth at bedtime as needed (sleep).   . metroNIDAZOLE (METROGEL) 0.75 % gel Apply 1 application topically daily.  . midodrine (PROAMATINE) 5 MG tablet Take 1 tablet (5 mg total) by mouth 3 (three) times daily with meals.   No facility-administered encounter medications on file as of 02/28/2019.    PHYSICAL EXAM:   PE deferred d/t tele-health/audio only nature of visit. Patient is alert and conversant. No dyspnea with conversation. Positive affect.  [name], NP

## 2019-03-01 ENCOUNTER — Other Ambulatory Visit: Payer: Self-pay | Admitting: *Deleted

## 2019-03-01 DIAGNOSIS — C210 Malignant neoplasm of anus, unspecified: Secondary | ICD-10-CM

## 2019-03-04 ENCOUNTER — Encounter: Payer: Self-pay | Admitting: Hematology and Oncology

## 2019-03-04 ENCOUNTER — Inpatient Hospital Stay: Payer: Medicare Other | Attending: Hematology and Oncology | Admitting: Hematology and Oncology

## 2019-03-04 ENCOUNTER — Inpatient Hospital Stay: Payer: Medicare Other

## 2019-03-04 ENCOUNTER — Other Ambulatory Visit: Payer: Self-pay

## 2019-03-04 VITALS — BP 99/64 | HR 113 | Temp 98.0°F | Ht 73.0 in | Wt 132.1 lb

## 2019-03-04 DIAGNOSIS — Z7901 Long term (current) use of anticoagulants: Secondary | ICD-10-CM | POA: Insufficient documentation

## 2019-03-04 DIAGNOSIS — C210 Malignant neoplasm of anus, unspecified: Secondary | ICD-10-CM | POA: Diagnosis present

## 2019-03-04 DIAGNOSIS — R000 Tachycardia, unspecified: Secondary | ICD-10-CM | POA: Diagnosis not present

## 2019-03-04 DIAGNOSIS — Z8572 Personal history of non-Hodgkin lymphomas: Secondary | ICD-10-CM | POA: Diagnosis not present

## 2019-03-04 DIAGNOSIS — E861 Hypovolemia: Secondary | ICD-10-CM

## 2019-03-04 DIAGNOSIS — F17210 Nicotine dependence, cigarettes, uncomplicated: Secondary | ICD-10-CM | POA: Diagnosis not present

## 2019-03-04 DIAGNOSIS — D500 Iron deficiency anemia secondary to blood loss (chronic): Secondary | ICD-10-CM

## 2019-03-04 DIAGNOSIS — Z79899 Other long term (current) drug therapy: Secondary | ICD-10-CM | POA: Insufficient documentation

## 2019-03-04 DIAGNOSIS — I9589 Other hypotension: Secondary | ICD-10-CM | POA: Diagnosis not present

## 2019-03-04 LAB — CBC WITH DIFFERENTIAL (CANCER CENTER ONLY)
Abs Immature Granulocytes: 0.08 10*3/uL — ABNORMAL HIGH (ref 0.00–0.07)
Basophils Absolute: 0.1 10*3/uL (ref 0.0–0.1)
Basophils Relative: 0 %
Eosinophils Absolute: 0.1 10*3/uL (ref 0.0–0.5)
Eosinophils Relative: 1 %
HCT: 27.3 % — ABNORMAL LOW (ref 39.0–52.0)
Hemoglobin: 8.2 g/dL — ABNORMAL LOW (ref 13.0–17.0)
Immature Granulocytes: 1 %
Lymphocytes Relative: 11 %
Lymphs Abs: 1.8 10*3/uL (ref 0.7–4.0)
MCH: 21.5 pg — ABNORMAL LOW (ref 26.0–34.0)
MCHC: 30 g/dL (ref 30.0–36.0)
MCV: 71.7 fL — ABNORMAL LOW (ref 80.0–100.0)
Monocytes Absolute: 1.1 10*3/uL — ABNORMAL HIGH (ref 0.1–1.0)
Monocytes Relative: 7 %
Neutro Abs: 12.6 10*3/uL — ABNORMAL HIGH (ref 1.7–7.7)
Neutrophils Relative %: 80 %
Platelet Count: 655 10*3/uL — ABNORMAL HIGH (ref 150–400)
RBC: 3.81 MIL/uL — ABNORMAL LOW (ref 4.22–5.81)
RDW: 22.3 % — ABNORMAL HIGH (ref 11.5–15.5)
WBC Count: 15.7 10*3/uL — ABNORMAL HIGH (ref 4.0–10.5)
nRBC: 0 % (ref 0.0–0.2)

## 2019-03-04 LAB — CMP (CANCER CENTER ONLY)
ALT: 16 U/L (ref 0–44)
AST: 8 U/L — ABNORMAL LOW (ref 15–41)
Albumin: 2.4 g/dL — ABNORMAL LOW (ref 3.5–5.0)
Alkaline Phosphatase: 121 U/L (ref 38–126)
Anion gap: 11 (ref 5–15)
BUN: 8 mg/dL (ref 6–20)
CO2: 23 mmol/L (ref 22–32)
Calcium: 9.5 mg/dL (ref 8.9–10.3)
Chloride: 101 mmol/L (ref 98–111)
Creatinine: 0.79 mg/dL (ref 0.61–1.24)
GFR, Est AFR Am: >60 mL/min (ref >60)
GFR, Estimated: >60 mL/min (ref >60)
Glucose, Bld: 98 mg/dL (ref 70–99)
Potassium: 3.8 mmol/L (ref 3.5–5.1)
Sodium: 135 mmol/L (ref 135–145)
Total Bilirubin: 0.2 mg/dL — ABNORMAL LOW (ref 0.3–1.2)
Total Protein: 8.5 g/dL — ABNORMAL HIGH (ref 6.5–8.1)

## 2019-03-04 LAB — SAMPLE TO BLOOD BANK

## 2019-03-04 NOTE — Progress Notes (Signed)
[place] [phone]   [phone]  PROGRESS NOTE  Patient Care Team: [name], MD as PCP - General (Family Medicine) Jacqualine Mau Aletha Halim, NP as Nurse Practitioner (Hospice and Palliative Medicine) Stitzenberg, Clint Lipps, MD as Referring Physician (Surgical Oncology) Gwinda Maine, MD as Referring Physician (Surgical Oncology)  Hematological/Oncological History # Metastatic Anal Cancer (summarized history obtained from [facility] Surgical Notes) 1) 03/09/10: CT scan performed showed superficial inflammation along the medial crease of the left buttocks within the subcutaneous fat measuring 2.5 x 5 cm including no areas of deep penetration with a few associated bubbles of air and gas present.  2) 03/10/10: A 2 x 2 cm area of condylomata was present in the perianal area that was excised by Dr. [name], along with multiple I&Ds of left gluteal abscesses. 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 No lymph nodes biopsied. 3) 03/29/10: PET scan showed abnormal 5.7 x 7.1 cm hypermetabolic soft tissue in the perianal region is compatible with patient's history of squamous cell carcinoma. Bilateral inguinal adenopathy, largest on left is hypermetabolic and measures 1.8 x 2.9 cm, and largest on the right measures 1.2 cm without hypermetabolism; question secondary to infection or metastatic involvement 4) 04/2010: Evaluated by Radiation Oncologist Dr. [name] at [facility]. He was not deemed a candidate for radiation given concern for metastatic disease based on PET scan. 5) 06/22/10: Recommended to begin systemic therapy with cisplatin and continuous infusion 5-FU for metastatic disease. 6) 07/2010: Re-evaluated at [facility] by Dr. [name] and Dr. [name], as he did not have biopsy proven metastatic disease, and inguinal adenopathy may be reactive due to infection/inflammation/abscess. 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 9) 02/10/14: After lost to follow-up, he returned to [facility] for evaluation by Dr. [name] due to concern for recurrence of disease throughout the perineum and perianal areas. The most worrisome is a large ulcer on the left perianal area.  10) 02/18/14: Exam under anesthesia with biopsies performed. Left perianal region biopsy confirmed invasive squamous cell carcinoma, moderate-to-poorly differentiated. Patient refused at this time APR. 11) 05/01/14: PET CT: Perianal and left groin/penile malignancy with bilateral inguinal and left external iliac metastases. 12) 07/07/14 - 09/03/14: XRT. Patient declined recommended concurrent 5FU/[facility]. At [place]. Patient refused APR. 13) 12/19/16: PET CT: No suspicious metabolically active osseous lesions are identified - Interval increase in the extent of the hypermetabolic activity involving the region of the anus which extends up into the fascia adjacent the rectum and out into the bilateral buttocks associated with soft tissue erosion and skin thickening. 14) 12/19/16: MRI Pelvis: Motion artefact. Anal tumor with gross invasion of the anal sphincters and extension into the ischioanal fossa, with irregular appearance of the perineum/gluteal skin, also abutting the pelvic sidewall (left > right) and likely invading prostate apex. Overall, this is increased from prior CT 01/05/2015, concerning for disease progression. Exam revealed obliteration of the anus. Patient refused APR. 15) 03/2018 - consideration for pembrolizumab - patient declined. 16) 10/2018 - represents with substantial bleeding episodes from erosive, locally-advanced anal cancer  17) 11/12/2018:  reviewed by [facility] surgery again. Admitted for consideration of ostomy from 10/4 to 11/21/2018. 18) 12/10/18-12/12/18: patient seen by [facility] (Dr.  Mettu) and Surgical Oncology.  MRI pelvis, and CT chest performed. 19) 12/19/2018: establish care with Dr. Lorenso Courier  20)  12/21/2018: 4th opinion received at [facility] [place] with Dr. [name].  21) 01/17/2019: Underwent a diverting colostomy with placement of a colostomy bag at [facility]. Tumor tissue excised for pathological review.  22) 12/21-12/26/2020: admitted to [facility] for marked anemia, weakness and hypotension.  #High Grade Non-Hodgkins Lymphoma 1)1999: He was treated for high-grade Non-Hodgkin's Lymphoma by Dr. [name] in [place], [place]. He received a year of chemotherapy. No radiation.   Interval History:  Dylan [name] 49 y.o. male with medical history significant for metastatic anal cancer presents for a follow up visit. He was last seen in our clinic on 02/04/2019 at which time due to weakness and hypovolemia he was admitted to [facility] for inpatient management.   In the interim since his last visit he is better than he did during his last visit.  He reports that he has been ambulatory and is able to walk to the bathroom kitchen, but is not able to walk outside the house.  He reports that he spends most of his day in bed or in a recliner.  He notes that his p.o. intake is all right, but that he has very little in the way of appetite.  He reports having no fevers, chills, sweats, nausea, vomiting or diarrhea.  He does endorse continuing to have bloody discharge from his wound site noting that he can saturate a napkin every 3 to 4 days.  He denies having any stool or urine discharge from his wound.  He reports his ostomy site is functioning normally and not causing him any discomfort or leakage.  On further discussion today he wanted to know what the surgeons intended to do about his wound.  After review of the notes and  surgeons both [facility] it is apparent that they believe that there is no surgical management available to further control his wound.  Dylan [name] and his daughter voiced their understanding.  MEDICAL HISTORY:  Past Medical History:  Diagnosis Date  . Cancer ([place]) 2018   anal cancer  . Non Hodgkin's lymphoma ([place]) 1999  . S/P radiation therapy 07/07/14-7/20/216   anal ca     SURGICAL HISTORY: Past Surgical History:  Procedure Laterality Date  . COLOSTOMY  2020   diverting colostomy at [facility]  . ILEOSTOMY  2020   diverting colostomy, [facility]    SOCIAL HISTORY: Social History   Socioeconomic History  . Marital status: Single    Spouse name: Not on file  . Number of children: Not on file  . Years of education: Not on file  . Highest education level: Not on file  Occupational History  . Not on file  Tobacco Use  . Smoking status: Current Some Day Smoker    Packs/day: 0.30    Types: Cigarettes  . Smokeless tobacco: Current User  Substance and Sexual Activity  . Alcohol use: No  . Drug use: Not on file  . Sexual activity: Not on file  Other Topics Concern  . Not on file  Social History Narrative  . Not on file   Social Determinants of Health   Financial Resource Strain:   . Difficulty of Paying Living Expenses: Not on file  Food Insecurity:   . Worried About Charity fundraiser in the Last Year: Not on file  . Ran Out of Food in the Last  Year: Not on file  Transportation Needs:   . Lack of Transportation (Medical): Not on file  . Lack of Transportation (Non-Medical): Not on file  Physical Activity:   . Days of Exercise per Week: Not on file  . Minutes of Exercise per Session: Not on file  Stress:   . Feeling of Stress : Not on file  Social Connections:   . Frequency of Communication with Friends and Family: Not on file  . Frequency of Social Gatherings with Friends and Family: Not on file  . Attends Religious Services: Not on  file  . Active Member of Clubs or Organizations: Not on file  . Attends Archivist Meetings: Not on file  . Marital Status: Not on file  Intimate Partner Violence:   . Fear of Current or Ex-Partner: Not on file  . Emotionally Abused: Not on file  . Physically Abused: Not on file  . Sexually Abused: Not on file    FAMILY HISTORY: Family History  Problem Relation Age of Onset  . Stroke Mother     ALLERGIES:  has No Known Allergies.  MEDICATIONS:  Current Outpatient Medications  Medication Sig Dispense Refill  . acetaminophen (TYLENOL) 325 MG tablet Take 650 mg by mouth every 6 (six) hours as needed for pain.    Marland Kitchen enoxaparin (LOVENOX) 40 MG/0.4ML injection Inject 40 mg into the skin daily.    . feeding supplement, ENSURE ENLIVE, (ENSURE ENLIVE) LIQD Take 237 mLs by mouth 2 (two) times daily between meals. 237 mL 0  . ferrous sulfate 325 (65 FE) MG tablet Take 1 tablet (325 mg total) by mouth daily with breakfast. 30 tablet 1  . gabapentin (NEURONTIN) 100 MG capsule Take 100 mg by mouth at bedtime as needed for sleep.    Marland Kitchen MELATONIN PO Take 1 capsule by mouth at bedtime as needed (sleep).     . metroNIDAZOLE (METROGEL) 0.75 % gel Apply 1 application topically daily.    . midodrine (PROAMATINE) 5 MG tablet Take 1 tablet (5 mg total) by mouth 3 (three) times daily with meals. 90 tablet 0  . Oxycodone HCl 20 MG TABS Take 20-30 mg by mouth See admin instructions. Take 1 tablet in the morning, take 1.5 tablets at lunch, and take 1 tablet at night     No current facility-administered medications for this visit.    REVIEW OF SYSTEMS:   Constitutional: ( - ) fevers, ( - )  chills , ( - ) night sweats Eyes: ( - ) blurriness of vision, ( - ) double vision, ( - ) watery eyes Ears, nose, mouth, throat, and face: ( - ) mucositis, ( - ) sore throat Respiratory: ( - ) cough, ( - ) dyspnea, ( - ) wheezes Cardiovascular: ( - ) palpitation, ( - ) chest discomfort, ( - ) lower extremity  swelling Gastrointestinal:  ( - ) nausea, ( - ) heartburn, ( - ) change in bowel habits Skin: ( - ) abnormal skin rashes Lymphatics: ( - ) new lymphadenopathy, ( - ) easy bruising Neurological: ( - ) numbness, ( - ) tingling, ( - ) new weaknesses Behavioral/Psych: ( - ) mood change, ( - ) new changes  All other systems were reviewed with the patient and are negative.  PHYSICAL EXAMINATION: ECOG PERFORMANCE STATUS: 4 - Bedbound  Vitals:   03/04/19 1053  BP: 99/64  Pulse: (!) 113  Temp: 98 F (36.7 C)  SpO2: 100%   GENERAL: chronically ill appearing middle  aged [place] American male. alert, no distress and comfortable SKIN: skin color, texture, turgor are normal, no rashes or significant lesions EYES: conjunctiva are pink and non-injected, sclera clear LUNGS: clear to auscultation and percussion with normal breathing effort HEART: tachycardic with no murmurs and no lower extremity edema ABDOMEN:ostomy in the left of the abdomen, light colored stool present.  WOUND: did not view today in clinic due to pain and inability to sit upright. Malodorous.  Musculoskeletal: no cyanosis of digits and no clubbing  PSYCH: alert & oriented x 3, fluent speech NEURO: no focal motor/sensory deficits  LABORATORY DATA:  I have reviewed the data as listed  CMP Latest Ref Rng & Units 03/04/2019 02/08/2019 02/06/2019  Glucose 70 - 99 mg/dL 98 105(H) 107(H)  BUN 6 - 20 mg/dL 8 12 5(L)  Creatinine 0.61 - 1.24 mg/dL 0.79 0.63 0.68  Sodium 135 - 145 mmol/L 135 138 135  Potassium 3.5 - 5.1 mmol/L 3.8 4.2 3.7  Chloride 98 - 111 mmol/L 101 106 103  CO2 22 - 32 mmol/L 23 26 24   Calcium 8.9 - 10.3 mg/dL 9.5 8.7(L) 8.9  Total Protein 6.5 - 8.1 g/dL 8.5(H) - 7.2  Total Bilirubin 0.3 - 1.2 mg/dL 0.2(L) - 0.5  Alkaline Phos 38 - 126 U/L 121 - 69  AST 15 - 41 U/L 8(L) - 11(L)  ALT 0 - 44 U/L 16 - 12   CBC Latest Ref Rng & Units 03/04/2019 02/09/2019 02/08/2019  WBC 4.0 - 10.5 K/uL 15.7(H) 12.2(H) 10.8(H)   Hemoglobin 13.0 - 17.0 g/dL 8.2(L) 7.8(L) 7.4(L)  Hematocrit 39.0 - 52.0 % 27.3(L) 26.0(L) 24.7(L)  Platelets 150 - 400 K/uL 655(H) 495(H) 461(H)   Iron/TIBC/Ferritin/ %Sat    Component Value Date/Time   IRON 8 (L) 02/04/2019 1527   TIBC 178 (L) 02/04/2019 1527   FERRITIN 184 02/04/2019 1527   IRONPCTSAT 4 (L) 02/04/2019 1527     RADIOGRAPHIC STUDIES: None to review  ASSESSMENT & PLAN Dylan [name] 49 y.o. male with medical history significant for non-Hodgkin lymphoma (treated in 1999 with chemo alone) who presents for follow up of metastatic anal cancer. His course has been markedly complicated and handled at a multitude of different institutions.   Dylan [name] was admitted to [facility] hospital from 02/04/2019 until 02/09/2019 for weakness and hypotension.  During the hospitalization he was evaluated by surgery who determined that his wound required no further surgical intervention.  This is similar to the viewpoint shared by the surgeons at [facility].  Given this it is apparent that we will not likely be able to get good control of the bleeding that he is having from this wound.  As such his hemoglobin will not be able to improve to normal levels and treatment will be markedly difficult.  I am also concerned that in the event he received treatment with immunotherapy his immune system would be weakened and he would get infection of the open wound.  Furthermore it is unlikely that there would be much utility in the use of immunotherapy given his markedly poor functional status.  I noted to the patient that if we were able to improve his blood counts energy level and mobility that immunotherapy would be reconsidered.  I noted to him that in his current state immunotherapy would not be a viable option.  He voiced his understanding of these findings.  I did request that we begin IV iron as his p.o. iron supplementation does not appear to be  increasing his iron levels.  He declined  to start IV iron, noting that he has enough going on right now.  In order to determine the next best course of action we will order a PET CT scan to determine the extent and spread of his disease.  #Metastatic Anal Cancer --will require new PET CT as restaging. We will order this today, presumably the inflammation from his surgery has normalized in the interim. CT Chest and MRI pelvis are available from [facility].  -- will optimize patient for immunotherapy treatment by replenishing his attempting to increase iron stores and increasing his blood counts.  --RTC in 4 weeks to allow Dylan [name] to recover from his surgery re-evaluate for cancer treatment. Currently awaiting results from the microsatellite stability and PD-L1 status from the sample of tumor collected during his procedure at [facility].   #Iron Deficiency Anemia Secondary to Blood Loss --patient declined to start IV therapy, though this was offered as his PO iron supplementation appears to be inadequate. He reports continued use of the PO iron pills.  --prior iron panel shows undetectable iron and iron sats. His ferritin was elevated at 92, likely falsely elevated in the setting of inflammation from the wound.  --patient will require transfusion if Hgb drops below 7.0. Continue to monitor at each subsequent visit. Hgb >8.0 today.   #Symptom Management --patient currently has a weekly visit from palliative care and two weekly visits from home health nursing. --continue oxycodone 50m QID --continue to monitor  All questions were answered. The patient knows to call the clinic with any problems, questions or concerns.  A total of more than 25 minutes were spent face-to-face with the patient during this encounter and over half of that time was spent on counseling and coordination of care as outlined above.   JLedell Peoples MD Department of Hematology/Oncology [place]oud[place]at [facility] LLCPhone: [phone]:  [phone]: [email or web address]  03/04/2019 11:14 AM

## 2019-03-05 ENCOUNTER — Telehealth: Payer: Self-pay | Admitting: Hematology and Oncology

## 2019-03-05 NOTE — Telephone Encounter (Signed)
Scheduled per los. Called, no voicemail. Mailed calendar

## 2019-03-08 ENCOUNTER — Inpatient Hospital Stay: Payer: Medicare Other | Admitting: Family Medicine

## 2019-03-09 ENCOUNTER — Encounter (HOSPITAL_COMMUNITY): Payer: Self-pay

## 2019-03-09 ENCOUNTER — Inpatient Hospital Stay (HOSPITAL_COMMUNITY): Payer: Medicare Other

## 2019-03-09 ENCOUNTER — Emergency Department (HOSPITAL_COMMUNITY): Payer: Medicare Other

## 2019-03-09 ENCOUNTER — Other Ambulatory Visit: Payer: Self-pay

## 2019-03-09 ENCOUNTER — Inpatient Hospital Stay (HOSPITAL_COMMUNITY)
Admission: EM | Admit: 2019-03-09 | Discharge: 2019-03-14 | DRG: 871 | Disposition: A | Discharge status: Home Health | Payer: Medicare Other | Attending: Internal Medicine | Admitting: Internal Medicine

## 2019-03-09 DIAGNOSIS — R000 Tachycardia, unspecified: Secondary | ICD-10-CM | POA: Diagnosis present

## 2019-03-09 DIAGNOSIS — R531 Weakness: Secondary | ICD-10-CM | POA: Diagnosis not present

## 2019-03-09 DIAGNOSIS — R6520 Severe sepsis without septic shock: Secondary | ICD-10-CM | POA: Diagnosis not present

## 2019-03-09 DIAGNOSIS — C7801 Secondary malignant neoplasm of right lung: Secondary | ICD-10-CM | POA: Diagnosis present

## 2019-03-09 DIAGNOSIS — Z8572 Personal history of non-Hodgkin lymphomas: Secondary | ICD-10-CM | POA: Diagnosis not present

## 2019-03-09 DIAGNOSIS — C7802 Secondary malignant neoplasm of left lung: Secondary | ICD-10-CM | POA: Diagnosis present

## 2019-03-09 DIAGNOSIS — Z20822 Contact with and (suspected) exposure to covid-19: Secondary | ICD-10-CM | POA: Diagnosis present

## 2019-03-09 DIAGNOSIS — Z681 Body mass index (BMI) 19 or less, adult: Secondary | ICD-10-CM | POA: Diagnosis not present

## 2019-03-09 DIAGNOSIS — Z9119 Patient's noncompliance with other medical treatment and regimen: Secondary | ICD-10-CM

## 2019-03-09 DIAGNOSIS — Z7401 Bed confinement status: Secondary | ICD-10-CM

## 2019-03-09 DIAGNOSIS — I959 Hypotension, unspecified: Secondary | ICD-10-CM | POA: Diagnosis not present

## 2019-03-09 DIAGNOSIS — A419 Sepsis, unspecified organism: Principal | ICD-10-CM | POA: Diagnosis present

## 2019-03-09 DIAGNOSIS — E872 Acidosis: Secondary | ICD-10-CM | POA: Diagnosis present

## 2019-03-09 DIAGNOSIS — Z79899 Other long term (current) drug therapy: Secondary | ICD-10-CM | POA: Diagnosis not present

## 2019-03-09 DIAGNOSIS — Z85048 Personal history of other malignant neoplasm of rectum, rectosigmoid junction, and anus: Secondary | ICD-10-CM

## 2019-03-09 DIAGNOSIS — F17210 Nicotine dependence, cigarettes, uncomplicated: Secondary | ICD-10-CM | POA: Diagnosis present

## 2019-03-09 DIAGNOSIS — E871 Hypo-osmolality and hyponatremia: Secondary | ICD-10-CM | POA: Diagnosis present

## 2019-03-09 DIAGNOSIS — R6521 Severe sepsis with septic shock: Secondary | ICD-10-CM | POA: Diagnosis present

## 2019-03-09 DIAGNOSIS — T148XXA Other injury of unspecified body region, initial encounter: Secondary | ICD-10-CM | POA: Diagnosis present

## 2019-03-09 DIAGNOSIS — L89212 Pressure ulcer of right hip, stage 2: Secondary | ICD-10-CM | POA: Diagnosis present

## 2019-03-09 DIAGNOSIS — Z9221 Personal history of antineoplastic chemotherapy: Secondary | ICD-10-CM

## 2019-03-09 DIAGNOSIS — Z435 Encounter for attention to cystostomy: Secondary | ICD-10-CM

## 2019-03-09 DIAGNOSIS — K6289 Other specified diseases of anus and rectum: Secondary | ICD-10-CM | POA: Diagnosis present

## 2019-03-09 DIAGNOSIS — E43 Unspecified severe protein-calorie malnutrition: Secondary | ICD-10-CM | POA: Diagnosis present

## 2019-03-09 DIAGNOSIS — L8990 Pressure ulcer of unspecified site, unspecified stage: Secondary | ICD-10-CM | POA: Insufficient documentation

## 2019-03-09 DIAGNOSIS — D62 Acute posthemorrhagic anemia: Secondary | ICD-10-CM | POA: Diagnosis present

## 2019-03-09 DIAGNOSIS — C210 Malignant neoplasm of anus, unspecified: Secondary | ICD-10-CM | POA: Diagnosis present

## 2019-03-09 DIAGNOSIS — R627 Adult failure to thrive: Secondary | ICD-10-CM | POA: Diagnosis present

## 2019-03-09 DIAGNOSIS — Z923 Personal history of irradiation: Secondary | ICD-10-CM

## 2019-03-09 DIAGNOSIS — Z933 Colostomy status: Secondary | ICD-10-CM

## 2019-03-09 DIAGNOSIS — D649 Anemia, unspecified: Secondary | ICD-10-CM | POA: Diagnosis present

## 2019-03-09 DIAGNOSIS — Z7189 Other specified counseling: Secondary | ICD-10-CM | POA: Diagnosis not present

## 2019-03-09 DIAGNOSIS — C7951 Secondary malignant neoplasm of bone: Secondary | ICD-10-CM | POA: Diagnosis present

## 2019-03-09 DIAGNOSIS — E611 Iron deficiency: Secondary | ICD-10-CM | POA: Diagnosis present

## 2019-03-09 DIAGNOSIS — E440 Moderate protein-calorie malnutrition: Secondary | ICD-10-CM | POA: Diagnosis not present

## 2019-03-09 DIAGNOSIS — N360 Urethral fistula: Secondary | ICD-10-CM | POA: Diagnosis present

## 2019-03-09 DIAGNOSIS — Z515 Encounter for palliative care: Secondary | ICD-10-CM | POA: Diagnosis not present

## 2019-03-09 LAB — CBC WITH DIFFERENTIAL/PLATELET
Abs Immature Granulocytes: 0.11 10*3/uL — ABNORMAL HIGH (ref 0.00–0.07)
Basophils Absolute: 0 10*3/uL (ref 0.0–0.1)
Basophils Relative: 0 %
Eosinophils Absolute: 0 10*3/uL (ref 0.0–0.5)
Eosinophils Relative: 0 %
HCT: 24.9 % — ABNORMAL LOW (ref 39.0–52.0)
Hemoglobin: 7.4 g/dL — ABNORMAL LOW (ref 13.0–17.0)
Immature Granulocytes: 1 %
Lymphocytes Relative: 8 %
Lymphs Abs: 1.3 10*3/uL (ref 0.7–4.0)
MCH: 21.3 pg — ABNORMAL LOW (ref 26.0–34.0)
MCHC: 29.7 g/dL — ABNORMAL LOW (ref 30.0–36.0)
MCV: 71.8 fL — ABNORMAL LOW (ref 80.0–100.0)
Monocytes Absolute: 1.3 10*3/uL — ABNORMAL HIGH (ref 0.1–1.0)
Monocytes Relative: 8 %
Neutro Abs: 13.9 10*3/uL — ABNORMAL HIGH (ref 1.7–7.7)
Neutrophils Relative %: 83 %
Platelets: 487 10*3/uL — ABNORMAL HIGH (ref 150–400)
RBC: 3.47 MIL/uL — ABNORMAL LOW (ref 4.22–5.81)
RDW: 22.1 % — ABNORMAL HIGH (ref 11.5–15.5)
WBC: 16.7 10*3/uL — ABNORMAL HIGH (ref 4.0–10.5)
nRBC: 0 % (ref 0.0–0.2)

## 2019-03-09 LAB — APTT: aPTT: 23 seconds — ABNORMAL LOW (ref 24–36)

## 2019-03-09 LAB — COMPREHENSIVE METABOLIC PANEL
ALT: 36 U/L (ref 0–44)
AST: 44 U/L — ABNORMAL HIGH (ref 15–41)
Albumin: 2.3 g/dL — ABNORMAL LOW (ref 3.5–5.0)
Alkaline Phosphatase: 125 U/L (ref 38–126)
Anion gap: 9 (ref 5–15)
BUN: 12 mg/dL (ref 6–20)
CO2: 22 mmol/L (ref 22–32)
Calcium: 9.2 mg/dL (ref 8.9–10.3)
Chloride: 102 mmol/L (ref 98–111)
Creatinine, Ser: 0.86 mg/dL (ref 0.61–1.24)
GFR calc Af Amer: >60 mL/min (ref >60)
GFR calc non Af Amer: >60 mL/min (ref >60)
Glucose, Bld: 89 mg/dL (ref 70–99)
Potassium: 3.5 mmol/L (ref 3.5–5.1)
Sodium: 133 mmol/L — ABNORMAL LOW (ref 135–145)
Total Bilirubin: 0.2 mg/dL — ABNORMAL LOW (ref 0.3–1.2)
Total Protein: 7.7 g/dL (ref 6.5–8.1)

## 2019-03-09 LAB — VITAMIN B12: Vitamin B-12: 357 pg/mL (ref 180–914)

## 2019-03-09 LAB — IRON AND TIBC
Iron: 21 ug/dL — ABNORMAL LOW (ref 45–182)
Saturation Ratios: 12 % — ABNORMAL LOW (ref 17.9–39.5)
TIBC: 174 ug/dL — ABNORMAL LOW (ref 250–450)
UIBC: 153 ug/dL

## 2019-03-09 LAB — RETICULOCYTES
Immature Retic Fract: 36.7 % — ABNORMAL HIGH (ref 2.3–15.9)
RBC.: 3.14 MIL/uL — ABNORMAL LOW (ref 4.22–5.81)
Retic Count, Absolute: 24.8 10*3/uL (ref 19.0–186.0)
Retic Ct Pct: 0.8 % (ref 0.4–3.1)

## 2019-03-09 LAB — LACTIC ACID, PLASMA
Lactic Acid, Venous: 2.4 mmol/L (ref 0.5–1.9)
Lactic Acid, Venous: 1.7 mmol/L (ref 0.5–1.9)

## 2019-03-09 LAB — FERRITIN: Ferritin: 33 ng/mL (ref 24–336)

## 2019-03-09 LAB — PROTIME-INR
INR: 1.2 (ref 0.8–1.2)
Prothrombin Time: 15.1 seconds (ref 11.4–15.2)

## 2019-03-09 LAB — FOLATE: Folate: 12 ng/mL (ref >5.9)

## 2019-03-09 LAB — POC SARS CORONAVIRUS 2 AG -  ED: SARS Coronavirus 2 Ag: NEGATIVE

## 2019-03-09 MED ORDER — OXYCODONE HCL 5 MG PO TABS
30.0000 mg | ORAL_TABLET | ORAL | Status: DC
  Administered 2019-03-10: 15:00:00 30 mg via ORAL
  Administered 2019-03-11 – 2019-03-12 (×2): 20 mg via ORAL
  Filled 2019-03-09 (×6): qty 6

## 2019-03-09 MED ORDER — ACETAMINOPHEN 650 MG RE SUPP: 650.0000 mg | Freq: Four times a day (QID) | RECTAL | Status: DC | PRN

## 2019-03-09 MED ORDER — CHLORHEXIDINE GLUCONATE CLOTH 2 % EX PADS
6.0000 | MEDICATED_PAD | Freq: Every day | CUTANEOUS | Status: DC
  Administered 2019-03-09 – 2019-03-13 (×5): 6 via TOPICAL

## 2019-03-09 MED ORDER — HYDROCORTISONE NA SUCCINATE PF 100 MG IJ SOLR
100.0000 mg | Freq: Once | INTRAMUSCULAR | Status: AC
  Administered 2019-03-09: 16:00:00 100 mg via INTRAVENOUS
  Filled 2019-03-09: qty 2

## 2019-03-09 MED ORDER — POLYETHYLENE GLYCOL 3350 17 G PO PACK: 17.0000 g | PACK | Freq: Every day | ORAL | Status: DC | PRN

## 2019-03-09 MED ORDER — MELATONIN 3 MG PO TABS
3.0000 mg | ORAL_TABLET | Freq: Every evening | ORAL | Status: DC | PRN
  Administered 2019-03-13: 3 mg via ORAL
  Filled 2019-03-09 (×2): qty 1

## 2019-03-09 MED ORDER — VANCOMYCIN HCL IN DEXTROSE 1-5 GM/200ML-% IV SOLN: 1000.0000 mg | Freq: Once | INTRAVENOUS | Status: DC

## 2019-03-09 MED ORDER — MIDODRINE HCL 5 MG PO TABS
5.0000 mg | ORAL_TABLET | Freq: Three times a day (TID) | ORAL | Status: DC
  Administered 2019-03-10 (×2): 5 mg via ORAL
  Filled 2019-03-09 (×2): qty 1

## 2019-03-09 MED ORDER — METRONIDAZOLE IN NACL 5-0.79 MG/ML-% IV SOLN: 500.0000 mg | Freq: Three times a day (TID) | INTRAVENOUS | Status: DC

## 2019-03-09 MED ORDER — VANCOMYCIN HCL 1250 MG/250ML IV SOLN
1250.0000 mg | Freq: Once | INTRAVENOUS | Status: AC
  Administered 2019-03-09: 18:00:00 1250 mg via INTRAVENOUS
  Filled 2019-03-09: qty 250

## 2019-03-09 MED ORDER — ACETAMINOPHEN 325 MG PO TABS
650.0000 mg | ORAL_TABLET | Freq: Four times a day (QID) | ORAL | Status: DC | PRN
  Filled 2019-03-09: qty 2

## 2019-03-09 MED ORDER — OXYCODONE HCL 5 MG PO TABS
20.0000 mg | ORAL_TABLET | ORAL | Status: DC
  Administered 2019-03-09 – 2019-03-14 (×10): 20 mg via ORAL
  Filled 2019-03-09 (×10): qty 4

## 2019-03-09 MED ORDER — SODIUM CHLORIDE 0.9 % IV SOLN: INTRAVENOUS | Status: DC

## 2019-03-09 MED ORDER — IOHEXOL 300 MG/ML  SOLN
100.0000 mL | Freq: Once | INTRAMUSCULAR | Status: AC | PRN
  Administered 2019-03-09: 100 mL via INTRAVENOUS

## 2019-03-09 MED ORDER — ENSURE ENLIVE PO LIQD
237.0000 mL | Freq: Two times a day (BID) | ORAL | Status: DC
  Administered 2019-03-10 – 2019-03-11 (×3): 237 mL via ORAL

## 2019-03-09 MED ORDER — METRONIDAZOLE IN NACL 5-0.79 MG/ML-% IV SOLN
500.0000 mg | Freq: Three times a day (TID) | INTRAVENOUS | Status: DC
  Administered 2019-03-09 – 2019-03-13 (×11): 500 mg via INTRAVENOUS
  Filled 2019-03-09 (×11): qty 100

## 2019-03-09 MED ORDER — SODIUM CHLORIDE 0.9 % IV SOLN
2.0000 g | Freq: Three times a day (TID) | INTRAVENOUS | Status: DC
  Administered 2019-03-10 – 2019-03-13 (×11): 2 g via INTRAVENOUS
  Filled 2019-03-09 (×11): qty 2

## 2019-03-09 MED ORDER — ONDANSETRON HCL 4 MG PO TABS: 4.0000 mg | ORAL_TABLET | Freq: Four times a day (QID) | ORAL | Status: DC | PRN

## 2019-03-09 MED ORDER — ONDANSETRON HCL 4 MG/2ML IJ SOLN
4.0000 mg | Freq: Four times a day (QID) | INTRAMUSCULAR | Status: DC | PRN
  Administered 2019-03-11 – 2019-03-12 (×2): 4 mg via INTRAVENOUS
  Filled 2019-03-09 (×2): qty 2

## 2019-03-09 MED ORDER — OXYCODONE HCL 20 MG PO TABS: 20.0000 mg | ORAL_TABLET | ORAL | Status: DC

## 2019-03-09 MED ORDER — SODIUM CHLORIDE 0.9 % IV SOLN
2.0000 g | Freq: Once | INTRAVENOUS | Status: AC
  Administered 2019-03-09: 14:00:00 2 g via INTRAVENOUS
  Filled 2019-03-09: qty 2

## 2019-03-09 MED ORDER — ORAL CARE MOUTH RINSE
15.0000 mL | Freq: Two times a day (BID) | OROMUCOSAL | Status: DC
  Administered 2019-03-11 – 2019-03-14 (×6): 15 mL via OROMUCOSAL

## 2019-03-09 MED ORDER — LACTATED RINGERS IV BOLUS (SEPSIS)
1000.0000 mL | Freq: Once | INTRAVENOUS | Status: AC
  Administered 2019-03-09: 15:00:00 1000 mL via INTRAVENOUS

## 2019-03-09 MED ORDER — METRONIDAZOLE IN NACL 5-0.79 MG/ML-% IV SOLN
500.0000 mg | Freq: Once | INTRAVENOUS | Status: AC
  Administered 2019-03-09: 16:00:00 500 mg via INTRAVENOUS
  Filled 2019-03-09: qty 100

## 2019-03-09 MED ORDER — FERROUS SULFATE 325 (65 FE) MG PO TABS
325.0000 mg | ORAL_TABLET | Freq: Every day | ORAL | Status: DC
  Administered 2019-03-10 – 2019-03-14 (×5): 325 mg via ORAL
  Filled 2019-03-09 (×6): qty 1

## 2019-03-09 MED ORDER — SODIUM CHLORIDE (PF) 0.9 % IJ SOLN
INTRAMUSCULAR | Status: AC
  Filled 2019-03-09: qty 50

## 2019-03-09 MED ORDER — LACTATED RINGERS IV BOLUS (SEPSIS)
1000.0000 mL | Freq: Once | INTRAVENOUS | Status: AC
  Administered 2019-03-09: 14:00:00 1000 mL via INTRAVENOUS

## 2019-03-09 MED ORDER — ENOXAPARIN SODIUM 40 MG/0.4ML ~~LOC~~ SOLN
40.0000 mg | SUBCUTANEOUS | Status: DC
  Administered 2019-03-10 – 2019-03-14 (×5): 40 mg via SUBCUTANEOUS
  Filled 2019-03-09 (×6): qty 0.4

## 2019-03-09 MED ORDER — VANCOMYCIN HCL IN DEXTROSE 1-5 GM/200ML-% IV SOLN
1000.0000 mg | Freq: Two times a day (BID) | INTRAVENOUS | Status: DC
  Administered 2019-03-10 – 2019-03-13 (×7): 1000 mg via INTRAVENOUS
  Filled 2019-03-09 (×7): qty 200

## 2019-03-09 NOTE — Progress Notes (Signed)
A consult was received from an ED physician for vancomycin & cefepime per pharmacy dosing.  The patient's profile has been reviewed for ht/wt/allergies/indication/available labs.   A one time order has been placed for vancomycin 1250 mg & cefepime 2 gm.   Further antibiotics/pharmacy consults should be ordered by admitting physician if indicated.                       Thank you,  Eudelia Bunch, Pharm.D 03/09/2019 2:00 PM

## 2019-03-09 NOTE — ED Notes (Signed)
ED TO INPATIENT HANDOFF REPORT  ED Nurse Name and Phone #: [phone]  S Name/Age/Gender Dylan [name] 49 y.o. male Room/Bed: WA18/WA18  Code Status   Code Status: Prior  Home/SNF/Other Home Patient oriented to: self, place, time and situation Is this baseline? Yes   Triage Complete: Triage complete  Chief Complaint Severe sepsis ([place]) [A41.9, R65.20]  Triage Note Patient coming from home with c/o dizziness x two days and decrease appetite/sepsis?.     Allergies No Known Allergies  Level of Care/Admitting Diagnosis ED Disposition    ED Disposition Condition [facility]: [place] [100102]  Level of Care: Stepdown [14]  Admit to SDU based on following criteria: Hemodynamic compromise or significant risk of instability:  Patient requiring short term acute titration and management of vasoactive drips, and invasive monitoring (i.e., CVP and Arterial line).  Covid Evaluation: Asymptomatic Screening Protocol (No Symptoms)  [facility]: [id number]  Admitting Physician: [place] ERIC J [id number]  Attending Physician: [place] ERIC J [id number]  Estimated length of stay: past midnight tomorrow  Certification:: I certify this patient will need inpatient services for at least 2 midnights       B Medical/Surgery History Past Medical History:  Diagnosis Date  . Cancer ([place]) 2018   anal cancer  . Non Hodgkin's lymphoma ([place]) 1999  . S/P radiation therapy 07/07/14-7/20/216   anal ca    Past Surgical History:  Procedure Laterality Date  . COLOSTOMY  2020   d[facility]ting colostomy at [facility]  . ILEOSTOMY  2020   diverting colostomy, [employer]     A IV Location/Drains/Wounds Patient Lines/Drains/Airways Status   Active Line/Drains/Airways    Name:   Placement date:   Placement time:   Site:   Days:   Peripheral IV 03/09/19 Anterior;Left Forearm   03/09/19    -    Forearm   less than 1   Peripheral IV 03/09/19  Right Antecubital   03/09/19    1426    Antecubital   less than 1   Colostomy LLQ   02/05/19    1800    LLQ   32   Suprapubic Catheter 16 Fr.   02/06/19    1433    -   31   Wound / Incision (Open or Dehisced) 10/18/18 Perineum Unstageable open wound   to perianal / perineum area   10/18/18    2300    Perineum   142   Wound / Incision (Open or Dehisced) 02/05/19 Incision - Open Sacrum Medial   02/05/19    1800    Sacrum   32          Intake/Output Last 24 hours No intake or output data in the 24 hours ending 03/09/19 1805  Labs/Imaging Results for orders placed or performed during the hospital encounter of 03/09/19 (from the past 48 hour(s))  Lactic acid, plasma     Status: Abnormal   Collection Time: 03/09/19  2:22 PM  Result Value Ref Range   Lactic Acid, Venous 2.4 (HH) 0.5 - 1.9 mmol/L    Comment: CRITICAL RESULT CALLED TO, READ BACK BY AND VERIFIED WITH: S.BINGHAM RN AT 1512 ON 03/09/19 BY S.VANHOORNE Performed at [facility], [place] [address], [place], [place] [address]   Comprehensive metabolic panel     Status: Abnormal   Collection Time: 03/09/19  2:22 PM  Result Value Ref Range   Sodium 133 (L) 135 - 145 mmol/L  Potassium 3.5 3.5 - 5.1 mmol/L   Chloride 102 98 - 111 mmol/L   CO2 22 22 - 32 mmol/L   Glucose, Bld 89 70 - 99 mg/dL   BUN 12 6 - 20 mg/dL   Creatinine, Ser 0.86 0.61 - 1.24 mg/dL   Calcium 9.2 8.9 - 10.3 mg/dL   Total Protein 7.7 6.5 - 8.1 g/dL   Albumin 2.3 (L) 3.5 - 5.0 g/dL   AST 44 (H) 15 - 41 U/L   ALT 36 0 - 44 U/L   Alkaline Phosphatase 125 38 - 126 U/L   Total Bilirubin 0.2 (L) 0.3 - 1.2 mg/dL   GFR calc non Af Amer >60 >60 mL/min   GFR calc Af Amer >60 >60 mL/min   Anion gap 9 5 - 15    Comment: Performed at [facility], [place] [address] [place], [place] [address]  CBC WITH DIFFERENTIAL     Status: Abnormal   Collection Time: 03/09/19  2:22 PM  Result Value Ref Range   WBC 16.7 (H) 4.0 - 10.5 K/uL   RBC  3.47 (L) 4.22 - 5.81 MIL/uL   Hemoglobin 7.4 (L) 13.0 - 17.0 g/dL    Comment: Reticulocyte Hemoglobin testing may be clinically indicated, consider ordering this additional test [id number]    HCT 24.9 (L) 39.0 - 52.0 %   MCV 71.8 (L) 80.0 - 100.0 fL   MCH 21.3 (L) 26.0 - 34.0 pg   MCHC 29.7 (L) 30.0 - 36.0 g/dL   RDW 22.1 (H) 11.5 - 15.5 %   Platelets 487 (H) 150 - 400 K/uL   nRBC 0.0 0.0 - 0.2 %   Neutrophils Relative % 83 %   Neutro Abs 13.9 (H) 1.7 - 7.7 K/uL   Lymphocytes Relative 8 %   Lymphs Abs 1.3 0.7 - 4.0 K/uL   Monocytes Relative 8 %   Monocytes Absolute 1.3 (H) 0.1 - 1.0 K/uL   Eosinophils Relative 0 %   Eosinophils Absolute 0.0 0.0 - 0.5 K/uL   Basophils Relative 0 %   Basophils Absolute 0.0 0.0 - 0.1 K/uL   Immature Granulocytes 1 %   Abs Immature Granulocytes 0.11 (H) 0.00 - 0.07 K/uL   Polychromasia PRESENT    Target Cells PRESENT     Comment: Performed at [facility], [place] [address] [place], [place] [address]  APTT     Status: Abnormal   Collection Time: 03/09/19  2:22 PM  Result Value Ref Range   aPTT 23 (L) 24 - 36 seconds    Comment: Performed at [facility], [place] [address] [place], [place] [address]  Protime-INR     Status: None   Collection Time: 03/09/19  2:22 PM  Result Value Ref Range   Prothrombin Time 15.1 11.4 - 15.2 seconds   INR 1.2 0.8 - 1.2    Comment: (NOTE) INR goal varies based on device and disease states. Performed at [facility], [place] [address], [place], [place] [address]   Type and screen [place]     Status: None   Collection Time: 03/09/19  2:42 PM  Result Value Ref Range   ABO/RH(D) O POS    Antibody Screen NEG    Sample Expiration      03/12/2019,2359 Performed at [facility], [place] [address] [place], [place] [address]   Lactic acid, plasma     Status: None   Collection Time: 03/09/19  4:11  PM  Result Value Ref Range    Lactic Acid, Venous 1.7 0.5 - 1.9 mmol/L    Comment: Performed at [facility], [place] [address], [place], [place] [address]   CT ABDOMEN PELVIS W CONTRAST  Result Date: 03/09/2019 CLINICAL DATA:  Weakness and dizziness. History of non-Hodgkin's lymphoma as well as anal cancer. Surgical history of diverting colostomy. 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 EXAM: CT ABDOMEN AND PELVIS WITH CONTRAST TECHNIQUE: Multidetector CT imaging of the abdomen and pelvis was performed using the standard protocol following bolus administration of intravenous contrast. CONTRAST:  155mL OMNIPAQUE IOHEXOL 300 MG/ML  SOLN COMPARISON:  CT abdomen dated 02/04/2019. FINDINGS: Lower chest: The majority of the previously described pulmonary nodules at the lung bases have increased in size, again highly suggestive of metastatic disease. Hepatobiliary: No focal liver abnormality is seen. Gallbladder is decompressed. No bile duct dilatation seen. Pancreas: Unremarkable. No pancreatic ductal dilatation or surrounding inflammatory changes. Spleen: Normal in size without focal abnormality. Adrenals/Urinary Tract: Adrenal glands appear normal. Kidneys are unremarkable without mass, stone or hydronephrosis. Bladder walls are circumferentially thickened, similar to the previous exam. Suprapubic catheter in place. Stomach/Bowel: No significant change in the sizes of the 2 previously described masses at the inferior and superior aspects of a large cavity in the perineal region (images 77 and 95). No obstruction or other complicating feature seen at the LEFT lower quadrant ostomy site. No dilated large or small bowel loops. Vascular/Lymphatic: Aortic atherosclerosis. No acute appearing vascular abnormality. Mildly prominent lymph nodes within the periaortic retroperitoneum and along the proximal iliac chain regions, suspicious for metastatic lymphadenopathy. Reproductive: Prostate gland is  obscured. Other: No intraperitoneal free fluid or abscess collection identified. No free intraperitoneal air. Musculoskeletal: Erosive changes along the inferior and posterior margins of the LEFT inferior pubic ramus, possibly post treatment change, suspicious for local osseous metastasis. Stable lesions within the L3 vertebral body RIGHT iliac bone, as previously described, again suggestive of treated osseous metastases. No new osseous finding. IMPRESSION: 1. No acute findings within the abdomen or pelvis. No appreciable change compared to the earlier CT abdomen of 02/04/2019. 2. The majority of the previously described pulmonary nodules at the lung bases have increased in size, compatible with pulmonary metastases. 3. No significant change in the sizes of the 2 previously described masses at the inferior and superior aspects of a large cavity in the perineal region, compatible with patient's previously described known anal malignancy. 4. Mildly prominent lymph nodes within the periaortic retroperitoneum and along the proximal iliac chain regions, compatible with previously described metastatic lymphadenopathy. 5. Erosive changes along the inferior and posterior margins of the LEFT inferior pubic ramus, possibly post treatment change, suspicious for local osseous metastasis. Stable lesions within the L3 vertebral body RIGHT iliac bone, again suggestive of treated osseous metastases. No new osseous finding. 6. Stable circumferential thickening of the walls of the bladder, again compatible with marked cystitis. Suprapubic Foley catheter remains in place. Electronically Signed   By: Franki Cabot M.D.   On: 03/09/2019 17:07   DG Chest Port 1 View  Result Date: 03/09/2019 CLINICAL DATA:  Dizziness for 2 days.  Possible sepsis. EXAM: PORTABLE CHEST 1 VIEW COMPARISON:  Single-view of the chest 02/04/2019, 11/07/2018. PET CT scan 03/135/2020. FINDINGS: Tiny nodular opacities in the right upper lobe are unchanged. No  consolidative process, pneumothorax or effusion. Heart size is normal. IMPRESSION: No change in tiny right upper lobe nodular opacities. No acute disease. Electronically  Signed   By: Inge Rise M.D.   On: 03/09/2019 15:05    Pending Labs Unresulted Labs (From admission, onward)    Start     Ordered   03/09/19 1356  Blood Culture (routine x 2)  BLOOD CULTURE X 2,   STAT     03/09/19 1357   03/09/19 1356  Urinalysis, Routine w reflex microscopic  ONCE - STAT,   STAT     03/09/19 1357   03/09/19 1356  Urine culture  ONCE - STAT,   STAT     03/09/19 1357   Signed and Held  Magnesium  Daily,   R     Signed and Held   Signed and Held  Sodium, urine, random  Once,   R     Signed and Held   Signed and Held  Osmolality, urine  Once,   R     Signed and Held   Signed and Held  CBC  (enoxaparin (LOVENOX)    CrCl >/= 30 ml/min)  Once,   R    Comments: Baseline for enoxaparin therapy IF NOT ALREADY DRAWN.  Notify MD if PLT < 100 K.    Signed and Held   Signed and Held  Creatinine, serum  (enoxaparin (LOVENOX)    CrCl >/= 30 ml/min)  Once,   R    Comments: Baseline for enoxaparin therapy IF NOT ALREADY DRAWN.    Signed and Held   Signed and Held  Creatinine, serum  (enoxaparin (LOVENOX)    CrCl >/= 30 ml/min)  Weekly,   R    Comments: while on enoxaparin therapy    Signed and Held   Signed and Held  Comprehensive metabolic panel  Tomorrow morning,   R     Signed and Held   Signed and Held  CBC  Tomorrow morning,   R     Signed and Held   Signed and Held  MRSA PCR Screening  Once,   R     Signed and Held   Signed and Held  Vitamin B12  (Anemia Panel (PNL))  Once,   R     Signed and Held   Signed and Held  Folate  (Anemia Panel (PNL))  Once,   R     Signed and Held   Signed and Held  Iron and TIBC  (Anemia Panel (PNL))  Once,   R     Signed and Held   Signed and Held  Ferritin  (Anemia Panel (PNL))  Once,   R     Signed and Held   Signed and Held  Reticulocytes  (Anemia Panel (PNL))   Once,   R     Signed and Held          Vitals/Pain Today's Vitals   03/09/19 1545 03/09/19 1600 03/09/19 1615 03/09/19 1630  BP: (!) 92/50 101/70 101/65 100/69  Pulse: 99 97 94 (!) 114  Resp: 15 15 15 12   Temp:      TempSrc:      SpO2: 100% 100% 100% 95%  Weight:      Height:      PainSc:        Isolation Precautions Airborne and Contact precautions  Medications Medications  vancomycin (VANCOREADY) IVPB 1250 mg/250 mL (1,250 mg Intravenous New Bag/Given 03/09/19 1736)  ceFEPIme (MAXIPIME) 2 g in sodium chloride 0.9 % 100 mL IVPB (has no administration in time range)  sodium chloride (PF) 0.9 % injection (has no administration  in time range)  vancomycin (VANCOCIN) IVPB 1000 mg/200 mL premix (has no administration in time range)  metroNIDAZOLE (FLAGYL) IVPB 500 mg (has no administration in time range)  lactated ringers bolus 1,000 mL (0 mLs Intravenous Stopped 03/09/19 1451)    And  lactated ringers bolus 1,000 mL (0 mLs Intravenous Stopped 03/09/19 1622)  ceFEPIme (MAXIPIME) 2 g in sodium chloride 0.9 % 100 mL IVPB (0 g Intravenous Stopped 03/09/19 1558)  metroNIDAZOLE (FLAGYL) IVPB 500 mg (0 mg Intravenous Stopped 03/09/19 1734)  hydrocortisone sodium succinate (SOLU-CORTEF) 100 MG injection 100 mg (100 mg Intravenous Given 03/09/19 1606)  iohexol (OMNIPAQUE) 300 MG/ML solution 100 mL (100 mLs Intravenous Contrast Given 03/09/19 1639)    Mobility walks with device Low fall risk   Focused Assessments .   R Recommendations: See Admitting Provider Note  Report given to:  Hilda Blades, RN  Additional Notes: n/a

## 2019-03-09 NOTE — H&P (Addendum)
History and Physical    Dylan [name] [id number] DOB: 02-20-1970 DOA: 03/09/2019  PCP: [name], MD  Patient coming from: Home  I have personally briefly reviewed patient's old medical records in [place]  Chief Complaint: Dizziness, lightheadedness  HPI: Dylan [name] is a 49 y.o. male with medical history significant of metastatic anal cancer status post radiation with diverting colostomy and suprapubic catheter placement; chronic anal wound who presents to the ED with dizziness/lightheadedness and associated decreased appetite over the past 2 days.  Patient does report increased purulent drainage from his anal wound.  No other specific complaints at this time.  Patient was admitted recently from 02/04/2019 through 02/09/2019 for similar episode related to severe sepsis/shock which was thought to be secondary to his anal wound versus UTI due to possible fistula formation.  He completed broad-spectrum antibiotics, did require vasopressors, Florinef hydrocortisone.  He was seen by general surgery at that time and was deemed not a surgical candidate for any kind of intervention and recommended follow-up with his surgeons at [facility].  He was then discharged on midodrine, which he states is unaware of this medication and likely has not been taking.  ED Course: Temperature 98.4, HR 99, RR 15, BP 72/54, SPO2 100% on room air.  Hemoglobin WBC count 16.7, hemoglobin 7.4, platelet 487.  Sodium 133, potassium 3.5, chloride 102, CO2 22, BUN 12, creatinine 0.86, glucose 89.  AST 44, ALT 36.  INR 1.2.  Lactic acid 2.4.  Chest x-ray with no acute cardiopulmonary disease process.  Urinalysis, urine culture, blood culture: Pending.  Patient received Flagyl, vancomycin, cefepime, lactated Ringer's x2 with improvement of his blood pressure to 97/69.  ED physician referred for admission for severe sepsis of unclear etiology, but highly suspicious for wound versus UTI.  Review of Systems: As per  HPI otherwise 10 point review of systems negative.    Past Medical History:  Diagnosis Date  . Cancer ([place]) 2018   anal cancer  . Non Hodgkin's lymphoma ([place]) 1999  . S/P radiation therapy 07/07/14-7/20/216   anal ca     Past Surgical History:  Procedure Laterality Date  . COLOSTOMY  2020   diverting colostomy at [facility]  . ILEOSTOMY  2020   diverting colostomy, [facility]     reports that he has been smoking cigarettes. He has been smoking about 0.30 packs per day. He uses smokeless tobacco. He reports that he does not drink alcohol. No history on file for drug.  No Known Allergies  Family History  Problem Relation Age of Onset  . Stroke Mother     Family history reviewed and not pertinent   Prior to Admission medications   Medication Sig Start Date End Date Taking? Authorizing Provider  acetaminophen (TYLENOL) 325 MG tablet Take 650 mg by mouth every 6 (six) hours as needed for pain. 11/21/18  Yes [provider]  enoxaparin (LOVENOX) 40 MG/0.4ML injection Inject 40 mg into the skin daily. 01/19/19  Yes [provider]  feeding supplement, ENSURE ENLIVE, (ENSURE ENLIVE) LIQD Take 237 mLs by mouth 2 (two) times daily between meals. 10/21/18  Yes Alma Friendly, MD  ferrous sulfate 325 (65 FE) MG tablet Take 1 tablet (325 mg total) by mouth daily with breakfast. 02/09/19 03/11/19 Yes Adhikari, Amrit, MD  MELATONIN PO Take 1 capsule by mouth at bedtime as needed (sleep).    Yes [provider]  metroNIDAZOLE (METROGEL) 0.75 % gel Apply 1 application topically daily as  needed (rosacea).  11/21/18  Yes [provider]  Oxycodone HCl 20 MG TABS Take 20-30 mg by mouth See admin instructions. Take 1 tablet in the morning, take 1.5 tablets at lunch, and take 1 tablet at night 01/09/19  Yes [provider]  midodrine (PROAMATINE) 5 MG tablet Take 1 tablet (5 mg total) by mouth 3 (three) times daily with meals. Patient not taking: Reported  on 03/09/2019 02/09/19   Shelly Coss, MD    Physical Exam: Vitals:   03/09/19 1500 03/09/19 1515 03/09/19 1530 03/09/19 1545  BP: (!) 102/58 (!) 92/51 (!) 97/57 (!) 92/50  Pulse: (!) 104 97 100 99  Resp: (!) 22 16 12 15   Temp:      TempSrc:      SpO2: 100% 100% 100% 100%  Weight:      Height:        Constitutional: NAD, calm, comfortable, thin and cachectic in appearance Eyes: PERRL, lids and conjunctivae normal ENMT: Mucous membranes are very dry. Posterior pharynx clear of any exudate or lesions.Normal dentition.  Neck: normal, supple, no masses, no thyromegaly Respiratory: clear to auscultation bilaterally, no wheezing, no crackles. Normal respiratory effort. No accessory muscle use.  Oxygenating well on room air Cardiovascular: Regular rate and rhythm, no murmurs / rubs / gallops. No extremity edema. 2+ pedal pulses. No carotid bruits.  Abdomen: no tenderness, no masses palpated. No hepatosplenomegaly. Bowel sounds positive.  Colostomy noted with thin yellow stool; suprapubic catheter noted draining clear yellow urine Musculoskeletal: no clubbing / cyanosis. No joint deformity upper and lower extremities. Good ROM, no contractures. Normal muscle tone.  Skin: Anal wound noted, foul-smelling, packing in place, see pictures detailed below Neurologic: CN 2-12 grossly intact. Sensation intact, DTR normal. Strength 5/5 in all 4.  Psychiatric: Normal judgment and insight. Alert and oriented x 3.  Depressed mood.         Labs on Admission: I have personally reviewed following labs and imaging studies  CBC: Recent Labs  Lab 03/04/19 1025 03/09/19 1422  WBC 15.7* 16.7*  NEUTROABS 12.6* 13.9*  HGB 8.2* 7.4*  HCT 27.3* 24.9*  MCV 71.7* 71.8*  PLT 655* 0000000*   Basic Metabolic Panel: Recent Labs  Lab 03/04/19 1025 03/09/19 1422  NA 135 133*  K 3.8 3.5  CL 101 102  CO2 23 22  GLUCOSE 98 89  BUN 8 12  CREATININE 0.79 0.86  CALCIUM 9.5 9.2   GFR: Estimated  Creatinine Clearance: 90.3 mL/min (by C-G formula based on SCr of 0.86 mg/dL). Liver Function Tests: Recent Labs  Lab 03/04/19 1025 03/09/19 1422  AST 8* 44*  ALT 16 36  ALKPHOS 121 125  BILITOT 0.2* 0.2*  PROT 8.5* 7.7  ALBUMIN 2.4* 2.3*   No results for input(s): LIPASE, AMYLASE in the last 168 hours. No results for input(s): AMMONIA in the last 168 hours. Coagulation Profile: Recent Labs  Lab 03/09/19 1422  INR 1.2   Cardiac Enzymes: No results for input(s): CKTOTAL, CKMB, CKMBINDEX, TROPONINI in the last 168 hours. BNP (last 3 results) No results for input(s): PROBNP in the last 8760 hours. HbA1C: No results for input(s): HGBA1C in the last 72 hours. CBG: No results for input(s): GLUCAP in the last 168 hours. Lipid Profile: No results for input(s): CHOL, HDL, LDLCALC, TRIG, CHOLHDL, LDLDIRECT in the last 72 hours. Thyroid Function Tests: No results for input(s): TSH, T4TOTAL, FREET4, T3FREE, THYROIDAB in the last 72 hours. Anemia Panel: No results for input(s): VITAMINB12, FOLATE, FERRITIN,  TIBC, IRON, RETICCTPCT in the last 72 hours. Urine analysis:    Component Value Date/Time   COLORURINE YELLOW 02/04/2019 2030   APPEARANCEUR TURBID (A) 02/04/2019 2030   LABSPEC 1.020 02/04/2019 2030   PHURINE 6.0 02/04/2019 2030   GLUCOSEU NEGATIVE 02/04/2019 2030   [place] (A) 02/04/2019 2030   [place] NEGATIVE 02/04/2019 2030   [place] 02/04/2019 2030   PROTEINUR >=300 (A) 02/04/2019 2030   NITRITE NEGATIVE 02/04/2019 2030   [place] (A) 02/04/2019 2030    Radiological Exams on Admission: DG Chest Port 1 View  Result Date: 03/09/2019 CLINICAL DATA:  Dizziness for 2 days.  Possible sepsis. EXAM: PORTABLE CHEST 1 VIEW COMPARISON:  Single-view of the chest 02/04/2019, 11/07/2018. PET CT scan 03/135/2020. FINDINGS: Tiny nodular opacities in the right upper lobe are unchanged. No consolidative process, pneumothorax or effusion. Heart size is  normal. IMPRESSION: No change in tiny right upper lobe nodular opacities. No acute disease. Electronically Signed   By: Inge Rise M.D.   On: 03/09/2019 15:05    EKG: Independently reviewed.   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  Was found to be significantly hypotensive with a blood pressure 72/54 with a lactic acid of 2.4.  White blood cell count elevated 16.7.  Chest x-ray with no acute cardiopulmonary disease process.  Etiology of his underlying sepsis likely secondary to his underlying chronic wound infection versus UTI.  Blood pressure responded well to IV fluid hydration and antibiotics, and increased to 97/69. --Admit to inpatient, stepdown unit --Blood cultures x2: Pending --Urinalysis with urine culture: Pending --Check MRSA PCR --Continue to trend lactic acid --Continue IV fluid hydration --Continue empiric antibiotics with vancomycin, cefepime, Flagyl; pharmacy consulted for dosing/monitoring --CT abdomen/pelvis with IV contrast pending for further evaluation of anal wound --Supportive care, antiemetics, antipyretics  Anal wound Patient with chronic wound, reports packing changed daily.  It is very necrotic and foul-smelling. --CT abdomen/pelvis as above for further evaluation to ensure no significant abscess formation. --Wound care consult for evaluation and treatment recommendations --Continue empiric antibiotics  as above  History of iron deficiency anemia Hemoglobin 7.4 with MCV 71.8.  Last hemoglobin 8.2 on 03/04/2019.  Etiology likely chronic blood loss from his underlying anal wound. --Check anemia panel --Continue home iron supplementation --Transfuse for hemoglobin <7.0 --Follow CBC daily  Metastatic anal cancer status post diverting colostomy and suprapubic catheter Hx high-grade non-Hodgkin's lymphoma Per previous general surgery notes, he is not a candidate for any other surgical intervention, and currently not a candidate for immunotherapy per oncology secondary to his poor functional status. --Continue outpatient follow-up with oncology and surgery with [facility] --Wound care/ostomy for continued colostomy care while inpatient --Will consult palliative care for assistance with goals of care, medical decision-making and family support given overall extremely poor prognosis.   DVT prophylaxis: Lovenox Code Status: Full code Family Communication: Discussed with patient at bedside Disposition Plan: Anticipate discharge home with resumption of  home health services when medically ready Consults called: Palliative care via MI on Admission status: Inpatient, stepdown unit   Severity of Illness: The appropriate patient status for this patient is INPATIENT. Inpatient status is judged to be reasonable and necessary in order to provide the required intensity of service to ensure the patient's safety. The patient's presenting symptoms, physical exam findings, and initial radiographic and laboratory data in the context of their chronic comorbidities is felt to place them at high risk for further clinical deterioration. Furthermore, it is not anticipated that the patient will be medically stable for discharge from the hospital within 2 midnights of admission. The following factors support the patient status of inpatient.   " The patient's presenting symptoms include dizziness, lightheadedness, poor oral  intake " The worrisome physical exam findings include hypotension, foul-smelling anal wound " The initial radiographic and laboratory data are worrisome because of elevated WBC count, lactic acidosis, hyponatremia " The chronic co-morbidities include history of high-grade lymphoma, iron deficiency anemia, metastatic anal cancer.   * I certify that at the point of admission it is my clinical judgment that the patient will require inpatient hospital care spanning beyond 2 midnights from the point of admission due to high intensity of service, high risk for further deterioration and high frequency of surveillance required.*    Kloe Oates J [place] DO [facility] Available via Epic secure chat 7am-7pm After these hours, please refer to coverage provider listed on [email or web address] 03/09/2019, 4:05 PM

## 2019-03-09 NOTE — ED Triage Notes (Signed)
Patient coming from home with c/o dizziness x two days and decrease appetite/sepsis?Marland Kitchen

## 2019-03-09 NOTE — Progress Notes (Signed)
Pharmacy Antibiotic Note  Dylan [name] is a 49 y.o. male admitted on 03/09/2019 with severe sepsis/septic shock secondary to anal wound vs. UTI.  Pharmacy has been consulted for Vancomycin and Cefepime dosing. Patient also placed on Metronidazole per MD.   Plan: Vancomycin 1250mg  IV x 1 already ordered in ED. Continue with Vancomycin 1g IV q12h thereafter Vancomycin levels at steady state, as indicated Cefepime 2g IV q8h Metronidazole 500mg  IV q8h per MD Monitor renal function, cultures, clinical course  Height: 6\' 1"  (185.4 cm) Weight: 137 lb (62.1 kg) IBW/kg (Calculated) : 79.9  Temp (24hrs), Avg:98.9 F (37.2 C), Min:98.9 F (37.2 C), Max:98.9 F (37.2 C)  Recent Labs  Lab 03/04/19 1025 03/09/19 1422 03/09/19 1611  WBC 15.7* 16.7*  --   CREATININE 0.79 0.86  --   LATICACIDVEN  --  2.4* 1.7    Estimated Creatinine Clearance: 90.3 mL/min (by C-G formula based on SCr of 0.86 mg/dL).    No Known Allergies  Antimicrobials this admission: 1/23 Vancomycin >> 1/23 Cefepime >> 1/23 Metronidazole >>  Dose adjustments this admission: --  Microbiology results: 1/23 BCx: sent 1/23 UCx: ordered  Thank you for allowing pharmacy to be a part of this patient's care.   Luiz Ochoa 03/09/2019 4:52 PM

## 2019-03-09 NOTE — ED Provider Notes (Addendum)
[facility] DEPT Provider Note   CSN: [id number] Arrival date & time: 03/09/19  1331     History No chief complaint on file.   Dylan [name] is a 49 y.o. male.  HPI   Patient presents to the emergency room for evaluation of weakness and dizziness.  Patient has a very complex medical history.  He has a history of non-Hodgkin's lymphoma as well as anal cancer.  Patient's had a prior diverting colostomy.  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  Patient's had episodes of sepsis and hypotension.  He was last admitted to the hospital in December.  Patient had recent outpatient follow-up with his oncologist on the 18th of this month.  Patient has not currently a candidate for any other surgical intervention.  Patient is not currently a candidate for immunotherapy because of his poor functional status.  Patient states the last few days he started to feel lightheaded and dizzy.  He denies having any increasing bleeding.  He denies any vomiting or diarrhea.  He denies any trouble with any chest pain or shortness of breath.  No recent fevers or chills.  Past Medical History:  Diagnosis Date  . Cancer ([place]) 2018   anal cancer  . Non Hodgkin's lymphoma ([place]) 1999  . S/P radiation therapy 07/07/14-7/20/216   anal ca     Patient Active Problem List   Diagnosis Date Noted  . Cancer cachexia ([place]) 02/06/2019  . Perforation of rectum from anal cancer s/p diverting colostomy 01/16/2019 02/06/2019  . Anal cellulitis 02/05/2019  . Septic shock ([place]) 02/05/2019  . 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 Colostomy - diverting loop colostomy in place Dec 2020 02/05/2019  . Suprapubic tube in place Dec 2020 for rectourethral fistula 02/05/2019  . Sepsis due to undetermined organism ([place]) 02/04/2019  . Microcytic anemia 02/04/2019  . Sepsis secondary to UTI ([place]) 02/04/2019  . Malnutrition of moderate degree 10/20/2018  .  Hypotension 10/18/2018  . Tachycardia 10/18/2018  . Anemia 10/18/2018  . Iron deficiency 10/18/2018  . Sepsis ([place]) 10/18/2018  . Cancer associated pain 02/23/2017  . Metastatic anal cancer 02/10/2014    Past Surgical History:  Procedure Laterality Date  . COLOSTOMY  2020   diverting colostomy [facility]  . ILEOSTOMY  2020   diverting colostomy, [employer]       Family History  Problem Relation Age of Onset  . Stroke Mother     Social History   Tobacco Use  . Smoking status: Current Some Day Smoker    Packs/day: 0.30    Types: Cigarettes  . Smokeless tobacco: Current User  Substance Use Topics  . Alcohol use: No  . Drug use: Not on file    Home Medications Prior to Admission medications   Medication Sig Start Date End Date Taking? Authorizing Provider  acetaminophen (TYLENOL) 325 MG tablet Take 650 mg by mouth every 6 (six) hours as needed for pain. 11/21/18  Yes [provider]  enoxaparin (LOVENOX) 40 MG/0.4ML injection Inject 40 mg into the skin daily. 01/19/19  Yes [provider]  feeding supplement, ENSURE ENLIVE, (ENSURE ENLIVE) LIQD Take 237 mLs by mouth 2 (two) times daily between meals. 10/21/18  Yes Alma Friendly, MD  ferrous sulfate 325 (65 FE) MG tablet Take 1 tablet (325 mg total) by mouth daily with breakfast. 02/09/19 03/11/19 Yes Adhikari, Amrit, MD  MELATONIN PO Take 1 capsule by mouth at bedtime  as needed (sleep).    Yes [provider]  metroNIDAZOLE (METROGEL) 0.75 % gel Apply 1 application topically daily. 11/21/18  Yes [provider]  Oxycodone HCl 20 MG TABS Take 20-30 mg by mouth See admin instructions. Take 1 tablet in the morning, take 1.5 tablets at lunch, and take 1 tablet at night 01/09/19  Yes [provider]  midodrine (PROAMATINE) 5 MG tablet Take 1 tablet (5 mg total) by mouth 3 (three) times daily with meals. 02/09/19   Shelly Coss, MD    Allergies    Patient has no known  allergies.  Review of Systems   Review of Systems  All other systems reviewed and are negative.   Physical Exam Updated Vital Signs BP 97/69   Pulse 99   Temp 98.9 F (37.2 C) (Oral)   Resp 15   Ht 1.854 m (6\' 1" )   Wt 62.1 kg   SpO2 100%   BMI 18.07 kg/m   Physical Exam Vitals and nursing note reviewed.  Constitutional:      Appearance: He is well-developed. He is ill-appearing.     Comments: Cachectic  HENT:     Head: Normocephalic and atraumatic.     Right Ear: External ear normal.     Left Ear: External ear normal.  Eyes:     General: No scleral icterus.       Right eye: No discharge.        Left eye: No discharge.     Conjunctiva/sclera: Conjunctivae normal.  Neck:     Trachea: No tracheal deviation.  Cardiovascular:     Rate and Rhythm: Regular rhythm. Tachycardia present.  Pulmonary:     Effort: Pulmonary effort is normal. No respiratory distress.     Breath sounds: Normal breath sounds. No stridor. No wheezing or rales.  Abdominal:     General: Bowel sounds are normal. There is no distension.     Palpations: Abdomen is soft.     Tenderness: There is no abdominal tenderness. There is no guarding or rebound.  Genitourinary:    Comments: Sacral wound with bandage in place, no active bleeding Musculoskeletal:        General: No tenderness.     Cervical back: Neck supple.  Skin:    General: Skin is warm and dry.     Findings: No rash.  Neurological:     Mental Status: He is alert.     Cranial Nerves: No cranial nerve deficit (no facial droop, extraocular movements intact, no slurred speech).     Sensory: No sensory deficit.     Motor: No abnormal muscle tone or seizure activity.     Coordination: Coordination normal.     ED Results / Procedures / Treatments   Labs (all labs ordered are listed, but only abnormal results are displayed) Labs Reviewed  LACTIC ACID, PLASMA - Abnormal; Notable for the following components:      Result Value   Lactic  Acid, Venous 2.4 (*)    All other components within normal limits  COMPREHENSIVE METABOLIC PANEL - Abnormal; Notable for the following components:   Sodium 133 (*)    Albumin 2.3 (*)    AST 44 (*)    Total Bilirubin 0.2 (*)    All other components within normal limits  CBC WITH DIFFERENTIAL/PLATELET - Abnormal; Notable for the following components:   WBC 16.7 (*)    RBC 3.47 (*)    Hemoglobin 7.4 (*)    HCT 24.9 (*)  MCV 71.8 (*)    MCH 21.3 (*)    MCHC 29.7 (*)    RDW 22.1 (*)    Platelets 487 (*)    Neutro Abs 13.9 (*)    Monocytes Absolute 1.3 (*)    Abs Immature Granulocytes 0.11 (*)    All other components within normal limits  APTT - Abnormal; Notable for the following components:   aPTT 23 (*)    All other components within normal limits  CULTURE, BLOOD (ROUTINE X 2)  CULTURE, BLOOD (ROUTINE X 2)  URINE CULTURE  PROTIME-INR  LACTIC ACID, PLASMA  URINALYSIS, ROUTINE W REFLEX MICROSCOPIC  POC SARS CORONAVIRUS 2 AG -  ED  TYPE AND SCREEN    EKG EKG Interpretation  Date/Time:  Saturday March 09 2019 13:49:34 EST Ventricular Rate:  153 PR Interval:    QRS Duration: 95 QT Interval:  284 QTC Calculation: [id number] Axis:   85 Text Interpretation: probable Sinus tachycardia Prolonged PR interval LAE, consider biatrial enlargement Nonspecific T abnormalities, lateral leads Baseline wander in lead(s) II Since last tracing rate faster Confirmed by Dorie Rank [id number]) on 03/09/2019 2:29:09 PM   EKG Interpretation  Date/Time:  Saturday March 09 2019 14:50:13 EST Ventricular Rate:  96 PR Interval:    QRS Duration: 83 QT Interval:  354 QTC Calculation: 448 R Axis:   86 Text Interpretation: Sinus rhythm Short PR interval RSR' in V1 or V2, probably normal variant Since last tracing rate slower Confirmed by Dorie Rank [phone]) on 03/09/2019 2:53:15 PM       Radiology DG Chest Port 1 View  Result Date: 03/09/2019 CLINICAL DATA:  Dizziness for 2 days.  Possible sepsis.  EXAM: PORTABLE CHEST 1 VIEW COMPARISON:  Single-view of the chest 02/04/2019, 11/07/2018. PET CT scan 03/135/2020. FINDINGS: Tiny nodular opacities in the right upper lobe are unchanged. No consolidative process, pneumothorax or effusion. Heart size is normal. IMPRESSION: No change in tiny right upper lobe nodular opacities. No acute disease. Electronically Signed   By: Inge Rise M.D.   On: 03/09/2019 15:05    Procedures .Critical Care Performed by: Dorie Rank, MD Authorized by: Dorie Rank, MD   Critical care provider statement:    Critical care time (minutes):  45   Critical care was time spent personally by me on the following activities:  Discussions with consultants, evaluation of patient's response to treatment, examination of patient, ordering and performing treatments and interventions, ordering and review of laboratory studies, ordering and review of radiographic studies, pulse oximetry, re-evaluation of patient's condition, obtaining history from patient or surrogate and review of old charts   (including critical care time)  Medications Ordered in ED Medications  metroNIDAZOLE (FLAGYL) IVPB 500 mg (has no administration in time range)  vancomycin (VANCOREADY) IVPB 1250 mg/250 mL (has no administration in time range)  hydrocortisone sodium succinate (SOLU-CORTEF) 100 MG injection 100 mg (has no administration in time range)  lactated ringers bolus 1,000 mL (0 mLs Intravenous Stopped 03/09/19 1451)    And  lactated ringers bolus 1,000 mL (1,000 mLs Intravenous New Bag/Given 03/09/19 1451)  ceFEPIme (MAXIPIME) 2 g in sodium chloride 0.9 % 100 mL IVPB (2 g Intravenous New Bag/Given 03/09/19 1427)    ED Course  I have reviewed the triage vital signs and the nursing notes.  Pertinent labs & imaging results that were available during my care of the patient were reviewed by me and considered in my medical decision making (see chart for details).  Clinical Course as  of Mar 09 1531    Sat Mar 09, 2019  1357 EKG  probable sinus tach, hypotensive, will give fluid bolus, monitor closely   [JK]  1359 Sepsis a concern although no definite signs of fever or infection.     [JK]  1452 HR significantly improved.  Now 96.  Fluids infusing   [JK]    Clinical Course User Index [JK] Dorie Rank, MD   MDM Rules/Calculators/A&P                      Pt presented with weakness, dizziness.  Noted to be tachycardic and hypotensive initially.  Pt is responding to IV fluids.   Complex medical history, at risk for sepsis, acute on chronic blood loss anemia.  Will continue with fluids resuscitation.     Labs reviewed.  Patient does have leukocytosis and anemia.  Not significantly changed from baseline.  Lactic acid level is elevated.  Metabolic panel does not show evidence of acute kidney injury.  Symptoms are concerning for sepsis although exact etiology unclear.  Patient does have chronic wound in his perianal cavity.  He also has had cystitis recently and his urinalysis is currently pending.  Patient covered with broad-spectrum antibiotics.  I have added a dose of hydrocortisone for possible adrenal insufficiency.  He has responded to fluid resuscitation.  We will continue to monitor closely.  I will consult the medical service for admission. Final Clinical Impression(s) / ED Diagnoses Final diagnoses:  Hypotension, unspecified hypotension type  Sepsis, due to unspecified organism, unspecified whether acute organ dysfunction present [facility]      Dorie Rank, MD 03/09/19 [place]    Dorie Rank, MD 03/09/19 [phone]

## 2019-03-09 NOTE — Progress Notes (Signed)
Notified bedside nurse of need to draw repeat lactic acid. 

## 2019-03-10 DIAGNOSIS — Z7189 Other specified counseling: Secondary | ICD-10-CM

## 2019-03-10 DIAGNOSIS — Z515 Encounter for palliative care: Secondary | ICD-10-CM

## 2019-03-10 DIAGNOSIS — R531 Weakness: Secondary | ICD-10-CM

## 2019-03-10 LAB — CBC
HCT: 23.6 % — ABNORMAL LOW (ref 39.0–52.0)
HCT: 27.3 % — ABNORMAL LOW (ref 39.0–52.0)
Hemoglobin: 6.8 g/dL — CL (ref 13.0–17.0)
Hemoglobin: 8 g/dL — ABNORMAL LOW (ref 13.0–17.0)
MCH: 21 pg — ABNORMAL LOW (ref 26.0–34.0)
MCH: 22.3 pg — ABNORMAL LOW (ref 26.0–34.0)
MCHC: 28.8 g/dL — ABNORMAL LOW (ref 30.0–36.0)
MCHC: 29.3 g/dL — ABNORMAL LOW (ref 30.0–36.0)
MCV: 72.8 fL — ABNORMAL LOW (ref 80.0–100.0)
MCV: 76.3 fL — ABNORMAL LOW (ref 80.0–100.0)
Platelets: 474 10*3/uL — ABNORMAL HIGH (ref 150–400)
Platelets: 515 10*3/uL — ABNORMAL HIGH (ref 150–400)
RBC: 3.24 MIL/uL — ABNORMAL LOW (ref 4.22–5.81)
RBC: 3.58 MIL/uL — ABNORMAL LOW (ref 4.22–5.81)
RDW: 21.8 % — ABNORMAL HIGH (ref 11.5–15.5)
RDW: 22.4 % — ABNORMAL HIGH (ref 11.5–15.5)
WBC: 13.4 10*3/uL — ABNORMAL HIGH (ref 4.0–10.5)
WBC: 11.5 10*3/uL — ABNORMAL HIGH (ref 4.0–10.5)
nRBC: 0 % (ref 0.0–0.2)
nRBC: 0 % (ref 0.0–0.2)

## 2019-03-10 LAB — COMPREHENSIVE METABOLIC PANEL
ALT: 31 U/L (ref 0–44)
AST: 21 U/L (ref 15–41)
Albumin: 2 g/dL — ABNORMAL LOW (ref 3.5–5.0)
Alkaline Phosphatase: 99 U/L (ref 38–126)
Anion gap: 7 (ref 5–15)
BUN: 9 mg/dL (ref 6–20)
CO2: 22 mmol/L (ref 22–32)
Calcium: 8.9 mg/dL (ref 8.9–10.3)
Chloride: 106 mmol/L (ref 98–111)
Creatinine, Ser: 0.61 mg/dL (ref 0.61–1.24)
GFR calc Af Amer: >60 mL/min (ref >60)
GFR calc non Af Amer: >60 mL/min (ref >60)
Glucose, Bld: 120 mg/dL — ABNORMAL HIGH (ref 70–99)
Potassium: 4 mmol/L (ref 3.5–5.1)
Sodium: 135 mmol/L (ref 135–145)
Total Bilirubin: 0.2 mg/dL — ABNORMAL LOW (ref 0.3–1.2)
Total Protein: 6.8 g/dL (ref 6.5–8.1)

## 2019-03-10 LAB — SARS CORONAVIRUS 2 (TAT 6-24 HRS): SARS Coronavirus 2: NEGATIVE

## 2019-03-10 LAB — PREPARE RBC (CROSSMATCH)

## 2019-03-10 LAB — LACTIC ACID, PLASMA
Lactic Acid, Venous: 1.4 mmol/L (ref 0.5–1.9)
Lactic Acid, Venous: 2.2 mmol/L (ref 0.5–1.9)

## 2019-03-10 LAB — MRSA PCR SCREENING: MRSA by PCR: NEGATIVE

## 2019-03-10 LAB — MAGNESIUM: Magnesium: 1.8 mg/dL (ref 1.7–2.4)

## 2019-03-10 MED ORDER — MIDODRINE HCL 5 MG PO TABS
10.0000 mg | ORAL_TABLET | Freq: Three times a day (TID) | ORAL | Status: DC
  Administered 2019-03-10 – 2019-03-11 (×4): 10 mg via ORAL
  Filled 2019-03-10 (×4): qty 2

## 2019-03-10 MED ORDER — SODIUM CHLORIDE 0.9% IV SOLUTION: Freq: Once | INTRAVENOUS | Status: AC

## 2019-03-10 MED ORDER — LACTATED RINGERS IV BOLUS
1000.0000 mL | Freq: Once | INTRAVENOUS | Status: AC
  Administered 2019-03-10: 01:00:00 1000 mL via INTRAVENOUS

## 2019-03-10 MED ORDER — SODIUM CHLORIDE 0.9 % IV SOLN: INTRAVENOUS | Status: DC

## 2019-03-10 MED ORDER — SODIUM CHLORIDE 0.9 % IV BOLUS: 500.0000 mL | Freq: Once | INTRAVENOUS | Status: DC

## 2019-03-10 NOTE — Progress Notes (Signed)
PROGRESS NOTE  AUNER DETTLING [id number] DOB: Jul 12, 1970 DOA: 03/09/2019 PCP: [name], MD  HPI/Recap of past 24 hours: Dylan [name] is a 49 y.o. male with medical history significant of metastatic anal cancer status post radiation with diverting colostomy and suprapubic catheter placement; chronic anal wound who presents to the ED with dizziness/lightheadedness and associated decreased appetite over the past 2 days.  Patient does report increased purulent drainage from his anal wound.  No other specific complaints at this time.  Patient was admitted recently from 02/04/2019 through 02/09/2019 for similar episode related to severe sepsis/shock which was thought to be secondary to his anal wound versus UTI due to possible fistula formation.  He completed broad-spectrum antibiotics, did require vasopressors, Florinef hydrocortisone.  He was seen by general surgery at that time and was deemed not a surgical candidate for any kind of intervention and recommended follow-up with his surgeons at [facility].  He was then discharged on midodrine, which he states is unaware of this medication and likely has not been taking.  ED Course: Temperature 98.4, HR 99, RR 15, BP 72/54, SPO2 100% on room air.  Hemoglobin WBC count 16.7, hemoglobin 7.4, platelet 487.  Sodium 133, potassium 3.5, chloride 102, CO2 22, BUN 12, creatinine 0.86, glucose 89.  AST 44, ALT 36.  INR 1.2.  Lactic acid 2.4.  Chest x-ray with no acute cardiopulmonary disease process.  Urinalysis, urine culture, blood culture: Pending.  Patient received Flagyl, vancomycin, cefepime, lactated Ringer's x2 with improvement of his blood pressure to 97/69.  ED physician referred for admission for severe sepsis of unclear etiology, but highly suspicious for wound versus UTI.  03/10/19: Seen and examined.  States anal pain is currently okay 5 out of 10 with pain medications.  Foul-smelling excessive purulent drainage from anal wound.  Blood  pressure is soft post 5 L bolus infusion.  Currently on normal saline at 125 cc/h.  Restarting home dose of midodrine.  General surgery has been consulted to assess wound, may need wound culture.  Blood cultures x2 in process.  Continue broad-spectrum IV antibiotics.    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 Patient had previously not been a surgical candidate per medical records review Currently on normal saline at 125 cc/h, maintaining MAP greater than 65 Restarting home dose midodrine Currently on cefepime, IV Flagyl, and IV vancomycin, continue day #2 Continue to closely monitor in stepdown unit  Acute blood loss anemia likely secondary to chronic bleed from anal wound Hemoglobin dropped to 6.8 this morning New baseline hemoglobin appears to be 8 with low MCV 71. Transfuse 1 unit PRBC Repeat H&H posttransfusion  Iron deficiency anemia Per medical review patient has refused IV iron infusion at oncology's office Iron studies suggestive of iron deficiency  Continue p.o. iron supplement We will transfuse IV Feraheme 510 mg once if patient is agreeable  Severe protein calorie malnutrition Albumin 2.0 Severe muscle mass loss Start oral  supplement Dietitian consult  Failure to thrive in the setting of metastatic anal cancer Encourage oral intake as tolerated  Chronic anal wound Patient with chronic wound, reports packing changed daily.  It is very necrotic and foul-smelling. --CT abdomen/pelvis as above for further evaluation to ensure no significant abscess formation.  Please review radiology's report. --Wound care consult for evaluation and treatment recommendations -Also general surgery consult for possible wound culture or surgical intervention if warranted. --Continue empiric antibiotics as above  Metastatic anal cancer status post diverting colostomy and suprapubic catheter Hx high-grade non-Hodgkin's lymphoma Per previous general surgery notes, he is not a candidate for any other surgical intervention, and currently not a candidate for immunotherapy per oncology secondary to his poor functional status. --Continue outpatient follow-up with oncology and surgery with [facility] --Wound care/ostomy for continued colostomy care while inpatient --Will consult palliative care for assistance with goals of care, medical decision-making and family support given overall extremely poor prognosis.  Goals of care Patient has a very poor prognosis Has been followed by palliative outpatient per medical records reviewed Palliative care team has been consulted to assist with establishing goals of care. Currently full code.   DVT prophylaxis: Lovenox subcu daily Code Status: Full code Family Communication:  None at bedside. Disposition Plan:  Patient is from home.  Plan is to discharge to home with home health services.  Barriers to discharge severe sepsis needing IV antibiotics and IV fluids.    Consults called: Palliative care, general surgery 03/10/19 Dr. Marcello Moores.     Objective: Vitals:   03/10/19 0500 03/10/19 0600 03/10/19 0700 03/10/19 0800  BP: 99/60 (!) 90/52 (!) 78/53 (!) 93/57  Pulse: 75 74 76 70  Resp: 16 12  13 11   Temp:    98.9 F (37.2 C)  TempSrc:      SpO2: 100% 100% 100% 100%  Weight:      Height:        Intake/Output Summary (Last 24 hours) at 03/10/2019 0854 Last data filed at 03/10/2019 0700 Gross per 24 hour  Intake 2738.47 ml  Output 1100 ml  Net 1638.47 ml   Filed Weights   03/09/19 1434  Weight: 62.1 kg    Exam:  . General: 49 y.o. year-old male very frail-appearing in no acute distress.  Alert and oriented x3.   . Cardiovascular: Regular rate and rhythm with no rubs or gallops.  No thyromegaly or JVD noted.   Marland Kitchen Respiratory: Clear to auscultation with no wheezes or rales. Good inspiratory effort. . Abdomen: Soft nontender nondistended with normal bowel sounds x4 quadrants. . Musculoskeletal: No lower extremity edema. 2/4 pulses in all 4 extremities. . Skin: Anal wound with significant purulent drainage and foul odor. Marland Kitchen Psychiatry: Mood is appropriate for condition and setting   Data Reviewed: CBC: Recent Labs  Lab 03/04/19 1025 03/09/19 1422 03/10/19 0519  WBC 15.7* 16.7* 13.4*  NEUTROABS 12.6* 13.9*  --   HGB 8.2* 7.4* 6.8*  HCT 27.3* 24.9* 23.6*  MCV 71.7* 71.8* 72.8*  PLT 655* 487* [id number]*   Basic Metabolic Panel: Recent Labs  Lab 03/04/19 1025 03/09/19 1422 03/10/19 0519  NA 135 133* 135  K 3.8 3.5 4.0  CL 101 102 106  CO2 23 22 22   GLUCOSE 98 89 120*  BUN 8 12 9   CREATININE 0.79 0.86  0.61  CALCIUM 9.5 9.2 8.9  MG  --   --  1.8   GFR: Estimated Creatinine Clearance: 97 mL/min (by C-G formula based on SCr of 0.61 mg/dL). Liver Function Tests: Recent Labs  Lab 03/04/19 1025 03/09/19 1422 03/10/19 0519  AST 8* 44* 21  ALT 16 36 31  ALKPHOS 121 125 99  BILITOT 0.2* 0.2* 0.2*  PROT 8.5* 7.7 6.8  ALBUMIN 2.4* 2.3* 2.0*   No results for input(s): LIPASE, AMYLASE in the last 168 hours. No results for input(s): AMMONIA in the last 168 hours. Coagulation Profile: Recent Labs  Lab 03/09/19 1422  INR 1.2   Cardiac Enzymes: No results  for input(s): CKTOTAL, CKMB, CKMBINDEX, TROPONINI in the last 168 hours. BNP (last 3 results) No results for input(s): PROBNP in the last 8760 hours. HbA1C: No results for input(s): HGBA1C in the last 72 hours. CBG: No results for input(s): GLUCAP in the last 168 hours. Lipid Profile: No results for input(s): CHOL, HDL, LDLCALC, TRIG, CHOLHDL, LDLDIRECT in the last 72 hours. Thyroid Function Tests: No results for input(s): TSH, T4TOTAL, FREET4, T3FREE, THYROIDAB in the last 72 hours. Anemia Panel: Recent Labs    03/09/19 2003  VITAMINB12 357  FOLATE 12.0  FERRITIN 33  TIBC 174*  IRON 21*  RETICCTPCT 0.8   Urine analysis:    Component Value Date/Time   COLORURINE YELLOW 02/04/2019 2030   APPEARANCEUR TURBID (A) 02/04/2019 2030   LABSPEC 1.020 02/04/2019 2030   PHURINE 6.0 02/04/2019 2030   GLUCOSEU NEGATIVE 02/04/2019 2030   [place] (A) 02/04/2019 2030   [place] NEGATIVE 02/04/2019 2030   [place] 02/04/2019 2030   PROTEINUR >=300 (A) 02/04/2019 2030   NITRITE NEGATIVE 02/04/2019 2030   LEUKOCYTESUR LARGE (A) 02/04/2019 2030   Sepsis Labs: @LABRCNTIP (procalcitonin:4,lacticidven:4)  ) Recent Results (from the past 240 hour(s))  SARS CORONAVIRUS 2 (TAT 6-24 HRS) Nasopharyngeal Nasopharyngeal Swab     Status: None   Collection Time: 03/09/19  6:32 PM   Specimen: Nasopharyngeal Swab  Result Value Ref Range Status   SARS Coronavirus 2 NEGATIVE NEGATIVE Final    Comment: (NOTE) SARS-CoV-2 target nucleic acids are NOT DETECTED. The SARS-CoV-2 RNA is generally detectable in upper and lower respiratory specimens during the acute phase of infection. Negative results do not preclude SARS-CoV-2 infection, do not rule out co-infections with other pathogens, and should not be used as the sole basis for treatment or other patient management decisions. Negative results must be combined with clinical observations, patient history, and epidemiological  information. The expected result is Negative. Fact Sheet for Patients: SugarRoll.be Fact Sheet for Healthcare Providers: https://www.woods-mathews.com/ This test is not yet approved or cleared by the [place] FDA and  has been authorized for detection and/or diagnosis of SARS-CoV-2 by FDA under an Emergency Use Authorization (EUA). This EUA will remain  in effect (meaning this test can be used) for the duration of the COVID-19 declaration under Section 56 4(b)(1) of the Act, 21 U.S.C. section 360bbb-3(b)(1), unless the authorization is terminated or revoked sooner. Performed at [facility], [place] [address], [place], [place] [address]       Studies: CT ABDOMEN PELVIS W CONTRAST  Result Date: 03/09/2019 CLINICAL DATA:  Weakness and dizziness. History of non-Hodgkin's lymphoma as well as anal cancer. Surgical history of diverting colostomy. 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 EXAM: CT ABDOMEN AND PELVIS WITH CONTRAST TECHNIQUE: Multidetector CT imaging of the abdomen and pelvis was performed  using the standard protocol following bolus administration of intravenous contrast. CONTRAST:  164mL OMNIPAQUE IOHEXOL 300 MG/ML  SOLN COMPARISON:  CT abdomen dated 02/04/2019. FINDINGS: Lower chest: The majority of the previously described pulmonary nodules at the lung bases have increased in size, again highly suggestive of metastatic disease. Hepatobiliary: No focal liver abnormality is seen. Gallbladder is decompressed. No bile duct dilatation seen. Pancreas: Unremarkable. No pancreatic ductal dilatation or surrounding inflammatory changes. Spleen: Normal in size without focal abnormality. Adrenals/Urinary Tract: Adrenal glands appear normal. Kidneys are unremarkable without mass, stone or hydronephrosis. Bladder walls are circumferentially thickened, similar to the previous exam. Suprapubic catheter in place.  Stomach/Bowel: No significant change in the sizes of the 2 previously described masses at the inferior and superior aspects of a large cavity in the perineal region (images 77 and 95). No obstruction or other complicating feature seen at the LEFT lower quadrant ostomy site. No dilated large or small bowel loops. Vascular/Lymphatic: Aortic atherosclerosis. No acute appearing vascular abnormality. Mildly prominent lymph nodes within the periaortic retroperitoneum and along the proximal iliac chain regions, suspicious for metastatic lymphadenopathy. Reproductive: Prostate gland is obscured. Other: No intraperitoneal free fluid or abscess collection identified. No free intraperitoneal air. Musculoskeletal: Erosive changes along the inferior and posterior margins of the LEFT inferior pubic ramus, possibly post treatment change, suspicious for local osseous metastasis. Stable lesions within the L3 vertebral body RIGHT iliac bone, as previously described, again suggestive of treated osseous metastases. No new osseous finding. IMPRESSION: 1. No acute findings within the abdomen or pelvis. No appreciable change compared to the earlier CT abdomen of 02/04/2019. 2. The majority of the previously described pulmonary nodules at the lung bases have increased in size, compatible with pulmonary metastases. 3. No significant change in the sizes of the 2 previously described masses at the inferior and superior aspects of a large cavity in the perineal region, compatible with patient's previously described known anal malignancy. 4. Mildly prominent lymph nodes within the periaortic retroperitoneum and along the proximal iliac chain regions, compatible with previously described metastatic lymphadenopathy. 5. Erosive changes along the inferior and posterior margins of the LEFT inferior pubic ramus, possibly post treatment change, suspicious for local osseous metastasis. Stable lesions within the L3 vertebral body RIGHT iliac bone, again  suggestive of treated osseous metastases. No new osseous finding. 6. Stable circumferential thickening of the walls of the bladder, again compatible with marked cystitis. Suprapubic Foley catheter remains in place. Electronically Signed   By: Franki Cabot M.D.   On: 03/09/2019 17:07   DG Chest Port 1 View  Result Date: 03/09/2019 CLINICAL DATA:  Dizziness for 2 days.  Possible sepsis. EXAM: PORTABLE CHEST 1 VIEW COMPARISON:  Single-view of the chest 02/04/2019, 11/07/2018. PET CT scan 03/135/2020. FINDINGS: Tiny nodular opacities in the right upper lobe are unchanged. No consolidative process, pneumothorax or effusion. Heart size is normal. IMPRESSION: No change in tiny right upper lobe nodular opacities. No acute disease. Electronically Signed   By: Inge Rise M.D.   On: 03/09/2019 15:05    Scheduled Meds: . sodium chloride   Intravenous Once  . Chlorhexidine Gluconate Cloth  6 each Topical Daily  . enoxaparin (LOVENOX) injection  40 mg Subcutaneous Q24H  . feeding supplement (ENSURE ENLIVE)  237 mL Oral BID BM  . ferrous sulfate  325 mg Oral Q breakfast  . mouth rinse  15 mL Mouth Rinse BID  . midodrine  5 mg Oral TID WC  . oxyCODONE  20 mg Oral  2 times per day  . oxyCODONE  30 mg Oral Q24H    Continuous Infusions: . sodium chloride 125 mL/hr at 03/10/19 0322  . ceFEPime (MAXIPIME) IV Stopped (03/10/19 0717)  . metronidazole Stopped (03/10/19 ED:[id number])  . vancomycin 1,000 mg (03/10/19 0826)     LOS: 1 day     Kayleen Memos, MD [facility] Pager [phone]  If 7PM-7AM, please contact night-coverage [email or web address] Password [facility] 03/10/2019, 8:54 AM

## 2019-03-10 NOTE — Progress Notes (Signed)
Severe sepsis (HCC)  Subjective: Pt with metastatic anal cancer.  He is here due to sepsis.  He has a large pelvic wound.    Objective: Vital signs in last 24 hours: Temp:  [97.4 F (36.3 C)-98.9 F (37.2 C)] 97.6 F (36.4 C) (01/24 1232) Pulse Rate:  [68-155] 75 (01/24 1232) Resp:  [10-23] 15 (01/24 1232) BP: (66-108)/(35-70) 93/59 (01/24 1232) SpO2:  [95 %-100 %] 100 % (01/24 1232) Weight:  [62.1 kg] 62.1 kg (01/23 1434) Last BM Date: 03/09/19  Intake/Output from previous day: 01/23 0701 - 01/24 0700 In: 2738.5 [P.O.:120; I.V.:859.1; IV Piggyback:1759.4] Out: 1100 [Urine:1100] Intake/Output this shift: No intake/output data recorded.  General appearance: alert and cooperative GI: normal findings: soft, non-tender Incision/Wound: large open presacral wound.  No sign of infection, wound is clean with little granulation tissue.    Lab Results:  Results for orders placed or performed during the hospital encounter of 03/09/19 (from the past 24 hour(s))  Lactic acid, plasma     Status: Abnormal   Collection Time: 03/09/19  2:22 PM  Result Value Ref Range   Lactic Acid, Venous 2.4 (HH) 0.5 - 1.9 mmol/L  Comprehensive metabolic panel     Status: Abnormal   Collection Time: 03/09/19  2:22 PM  Result Value Ref Range   Sodium 133 (L) 135 - 145 mmol/L   Potassium 3.5 3.5 - 5.1 mmol/L   Chloride 102 98 - 111 mmol/L   CO2 22 22 - 32 mmol/L   Glucose, Bld 89 70 - 99 mg/dL   BUN 12 6 - 20 mg/dL   Creatinine, Ser 0.86 0.61 - 1.24 mg/dL   Calcium 9.2 8.9 - 10.3 mg/dL   Total Protein 7.7 6.5 - 8.1 g/dL   Albumin 2.3 (L) 3.5 - 5.0 g/dL   AST 44 (H) 15 - 41 U/L   ALT 36 0 - 44 U/L   Alkaline Phosphatase 125 38 - 126 U/L   Total Bilirubin 0.2 (L) 0.3 - 1.2 mg/dL   GFR calc non Af Amer >60 >60 mL/min   GFR calc Af Amer >60 >60 mL/min   Anion gap 9 5 - 15  CBC WITH DIFFERENTIAL     Status: Abnormal   Collection Time: 03/09/19  2:22 PM  Result Value Ref Range   WBC 16.7 (H) 4.0  - 10.5 K/uL   RBC 3.47 (L) 4.22 - 5.81 MIL/uL   Hemoglobin 7.4 (L) 13.0 - 17.0 g/dL   HCT 24.9 (L) 39.0 - 52.0 %   MCV 71.8 (L) 80.0 - 100.0 fL   MCH 21.3 (L) 26.0 - 34.0 pg   MCHC 29.7 (L) 30.0 - 36.0 g/dL   RDW 22.1 (H) 11.5 - 15.5 %   Platelets 487 (H) 150 - 400 K/uL   nRBC 0.0 0.0 - 0.2 %   Neutrophils Relative % 83 %   Neutro Abs 13.9 (H) 1.7 - 7.7 K/uL   Lymphocytes Relative 8 %   Lymphs Abs 1.3 0.7 - 4.0 K/uL   Monocytes Relative 8 %   Monocytes Absolute 1.3 (H) 0.1 - 1.0 K/uL   Eosinophils Relative 0 %   Eosinophils Absolute 0.0 0.0 - 0.5 K/uL   Basophils Relative 0 %   Basophils Absolute 0.0 0.0 - 0.1 K/uL   Immature Granulocytes 1 %   Abs Immature Granulocytes 0.11 (H) 0.00 - 0.07 K/uL   Polychromasia PRESENT    Target Cells PRESENT   APTT     Status: Abnormal   Collection  Time: 03/09/19  2:22 PM  Result Value Ref Range   aPTT 23 (L) 24 - 36 seconds  Protime-INR     Status: None   Collection Time: 03/09/19  2:22 PM  Result Value Ref Range   Prothrombin Time 15.1 11.4 - 15.2 seconds   INR 1.2 0.8 - 1.2  Blood Culture (routine x 2)     Status: None (Preliminary result)   Collection Time: 03/09/19  2:22 PM   Specimen: Right Antecubital; Blood  Result Value Ref Range   Specimen Description      RIGHT ANTECUBITAL Performed at [facility], [place] [address], [place], [place] [address]    Special Requests      BOTTLES DRAWN AEROBIC AND ANAEROBIC Blood Culture adequate volume Performed at [place] [address] [place], [place] [address]    Culture      NO GROWTH < 24 HOURS Performed at [facility] [address] [place], [place] [address]    Report Status PENDING   Blood Culture (routine x 2)     Status: None (Preliminary result)   Collection Time: 03/09/19  2:23 PM   Specimen: BLOOD LEFT HAND  Result Value Ref Range   Specimen Description      BLOOD LEFT HAND Performed at [facility] [address] [place], [place] [address]    Special Requests      BOTTLES DRAWN AEROBIC AND ANAEROBIC Blood Culture results may not be optimal due to an inadequate volume of blood received in culture bottles Performed at [facility], [place] [address], [place], [place] [address]    Culture      NO GROWTH < 24 HOURS Performed at [facility] [address], [place], [place] [address]    Report Status PENDING   Type and screen [place]     Status: None (Preliminary result)   Collection Time: 03/09/19  2:42 PM  Result Value Ref Range   ABO/RH(D) O POS    Antibody Screen NEG    Sample Expiration 03/12/2019,2359    Unit Number [id number]    Blood Component Type RED CELLS,LR    Unit division 00    Status of Unit ISSUED    Transfusion Status OK TO TRANSFUSE    Crossmatch Result      Compatible Performed at [facility], [place] [address], [place], [place] [address]   Lactic acid, plasma     Status: None   Collection Time: 03/09/19  4:11 PM  Result Value Ref Range   Lactic Acid, Venous 1.7 0.5 - 1.9 mmol/L  POC SARS Coronavirus 2 Ag-ED - Nasal Swab (BD Veritor Kit)     Status: None   Collection Time: 03/09/19  6:12 PM  Result Value Ref Range   SARS Coronavirus 2 Ag NEGATIVE NEGATIVE  SARS CORONAVIRUS 2 (TAT 6-24 HRS) Nasopharyngeal Nasopharyngeal Swab     Status: None   Collection Time: 03/09/19  6:32 PM   Specimen: Nasopharyngeal Swab  Result Value Ref Range   SARS Coronavirus 2 NEGATIVE NEGATIVE  Vitamin B12     Status: None   Collection Time: 03/09/19  8:03 PM  Result Value Ref Range   Vitamin B-12 357 180 - 914 pg/mL  Folate     Status: None   Collection Time: 03/09/19  8:03 PM  Result Value Ref Range   Folate 12.0 >5.9 ng/mL  Iron and TIBC  Status: Abnormal   Collection Time: 03/09/19  8:03 PM  Result Value Ref Range   Iron 21 (L) 45 - 182 ug/dL   TIBC 174 (L) 250 - 450 ug/dL   Saturation Ratios 12  (L) 17.9 - 39.5 %   UIBC 153 ug/dL  Ferritin     Status: None   Collection Time: 03/09/19  8:03 PM  Result Value Ref Range   Ferritin 33 24 - 336 ng/mL  Reticulocytes     Status: Abnormal   Collection Time: 03/09/19  8:03 PM  Result Value Ref Range   Retic Ct Pct 0.8 0.4 - 3.1 %   RBC. 3.14 (L) 4.22 - 5.81 MIL/uL   Retic Count, Absolute 24.8 19.0 - 186.0 K/uL   Immature Retic Fract 36.7 (H) 2.3 - 15.9 %  Lactic acid, plasma     Status: None   Collection Time: 03/10/19  2:07 AM  Result Value Ref Range   Lactic Acid, Venous 1.4 0.5 - 1.9 mmol/L  Magnesium     Status: None   Collection Time: 03/10/19  5:19 AM  Result Value Ref Range   Magnesium 1.8 1.7 - 2.4 mg/dL  Comprehensive metabolic panel     Status: Abnormal   Collection Time: 03/10/19  5:19 AM  Result Value Ref Range   Sodium 135 135 - 145 mmol/L   Potassium 4.0 3.5 - 5.1 mmol/L   Chloride 106 98 - 111 mmol/L   CO2 22 22 - 32 mmol/L   Glucose, Bld 120 (H) 70 - 99 mg/dL   BUN 9 6 - 20 mg/dL   Creatinine, Ser 0.61 0.61 - 1.24 mg/dL   Calcium 8.9 8.9 - 10.3 mg/dL   Total Protein 6.8 6.5 - 8.1 g/dL   Albumin 2.0 (L) 3.5 - 5.0 g/dL   AST 21 15 - 41 U/L   ALT 31 0 - 44 U/L   Alkaline Phosphatase 99 38 - 126 U/L   Total Bilirubin 0.2 (L) 0.3 - 1.2 mg/dL   GFR calc non Af Amer >60 >60 mL/min   GFR calc Af Amer >60 >60 mL/min   Anion gap 7 5 - 15  CBC     Status: Abnormal   Collection Time: 03/10/19  5:19 AM  Result Value Ref Range   WBC 13.4 (H) 4.0 - 10.5 K/uL   RBC 3.24 (L) 4.22 - 5.81 MIL/uL   Hemoglobin 6.8 (LL) 13.0 - 17.0 g/dL   HCT 23.6 (L) 39.0 - 52.0 %   MCV 72.8 (L) 80.0 - 100.0 fL   MCH 21.0 (L) 26.0 - 34.0 pg   MCHC 28.8 (L) 30.0 - 36.0 g/dL   RDW 21.8 (H) 11.5 - 15.5 %   Platelets 474 (H) 150 - 400 K/uL   nRBC 0.0 0.0 - 0.2 %  Lactic acid, plasma     Status: Abnormal   Collection Time: 03/10/19  5:19 AM  Result Value Ref Range   Lactic Acid, Venous 2.2 (HH) 0.5 - 1.9 mmol/L  Prepare RBC     Status:  None   Collection Time: 03/10/19  6:06 AM  Result Value Ref Range   Order Confirmation      ORDER PROCESSED BY BLOOD BANK Performed at [facility], [address], [place], [place] [address]      Studies/Results Radiology     MEDS, Scheduled . Chlorhexidine Gluconate Cloth  6 each Topical Daily  . enoxaparin (LOVENOX) injection  40 mg Subcutaneous Q24H  . feeding  supplement (ENSURE ENLIVE)  237 mL Oral BID BM  . ferrous sulfate  325 mg Oral Q breakfast  . mouth rinse  15 mL Mouth Rinse BID  . midodrine  5 mg Oral TID WC  . oxyCODONE  20 mg Oral 2 times per day  . oxyCODONE  30 mg Oral Q24H     Assessment: Severe sepsis ([place]) Metastatic anal cancer  Plan: Pt's wound appears clean, CT unchanged.  There are no surgical interventions that would prolong his life or help prevent sepsis.  Agree with palliative consult.  Pack wound BID.  Daily sitz baths or showers to clean wound.  Will see as needed while he is in the hospital.   LOS: 1 day    Rosario Adie, [facility], [place]    03/10/2019 12:42 PM

## 2019-03-10 NOTE — Progress Notes (Addendum)
CRITICAL VALUE ALERT  Critical Value:  Hgb 6.8  Date & Time Notied:  1/24 0600  Provider Notified:Bodenheimer  Orders Received/Actions taken: order to transfuse 1 UPRBC

## 2019-03-10 NOTE — Consult Note (Signed)
Consultation Note Date: 03/10/2019   Dylan [name] Name: Dylan [name]  DOB: 1970-07-21  MRN: [id number]  Age / Sex: 49 y.o., male  PCP: [name], MD Referring Physician: Kayleen Memos, DO  Reason for Consultation: Establishing goals of care  [name] Profile: 49 y.o. male  with past medical history of   metastatic anal cancer status post radiation with diverting colostomy and suprapubic catheter placement, has a chronic anal wound admitted on 03/09/2019 with dizziness/lightheadedness declining functional status decreased appetite as well as increased purulent drainage from anal wound.    Upon chart review, it is noted that the Dylan [name] was hospitalized in December 2020 with sepsis deemed secondary to anal wound versus urinary infection due to possible fistula formation.  At that time, he was seen by palliative and the goals of care at that time reflected full code/full scope care.  Dylan [name] goes to [facility] for his oncology care.  He has not been started on any treatments because of declining functional status.  He sees palliative care at [facility].  He is also connected with [facility] with their notes reflecting/full scope preference for care.   Clinical Assessment and Goals of Care: Dylan [name] remains admitted to hospital medicine service with severe sepsis deemed secondary to anal wound infection post radiation.  He has been placed on appropriate IV antibiotics as well as IV fluids.  General surgery input has been sought, however Dylan [name] is deemed not to be a candidate for any other surgical interventions.  He will also be given packed red blood cell transfusion for acute blood loss anemia likely source of bleeding being chronic bleed from anal wound.  He has severe protein calorie malnutrition with serum albumin 2 g/dL.   Palliative medicine team consult for ongoing goals of care  discussions.  Dylan [name] is resting in bed.  He responds some, does not engage much.  He remains desiring a full code/full scope care.  We discussed about his current conditions as well as an underlying serious illness.  He remains hopeful for stabilization/recovery.  He asked that we can continue conversations with his daughters.  He lives with Ellison Hughs and his other daughter Chardonnay.  Call placed and discussed with daughter Chardonnay at [phone].  We reviewed about Dylan [name]'s current hospitalization.  We reviewed about his functional status and is a cancer.  CT scan reflective of increasing size of pulmonary nodules as well as other changes discussed frankly but compassionately.  It is unknown whether the Dylan [name] has home health care at home or what extent home-based wound care is currently being done.  Dylan [name] lives with her daughter Ellison Hughs.  Both daughters help manage his care.  Dylan [name] reportedly has a fianc who lives in [place].  They are not married yet.  Dylan [name] has not completed any paperwork reflecting healthcare power of attorney information.  Dylan [name]'s daughter states that he does not like to talk much about his health for further medical decision making.  NEXT OF KIN 2 daughters  SUMMARY OF  RECOMMENDATIONS   Full code/full scope care for now.  Ongoing goals of care discussions regarding CODE STATUS as available as appropriate disposition options, broad goals of care discussions are ongoing. Discussed with Dylan [name]'s daughter by phone, discussed with Dylan [name], discussed with TRH MD as well.  Plan is to proceed with medical oncology consultation here.  Continue antibiotics, PRBC transfusion.  Monitor hospital course and overall disease trajectory.  Frankly discussed with Dylan [name]'s daughter on the phone that the Dylan [name] has declining functional status, ongoing symptom burden, ongoing burden from infection.  Discussed about need for ongoing conversations and needed for  considering things from a more palliative/comfort focused mode of care might become medically appropriate in the near future.  She is with understanding of the Dylan [name]'s current condition.  Dylan [name]'s 2 daughters overseeing his medical care. Thank you for the consult.  Code Status/Advance Care Planning:  Full code    Symptom Management:   Continue current mode of care  Palliative Prophylaxis:   Delirium Protocol  Additional Recommendations (Limitations, Scope, Preferences):  Full Scope Treatment  Psycho-social/Spiritual:   Desire for further Chaplaincy support:yes  Additional Recommendations: Caregiving  Support/Resources  Prognosis:   Unable to determine  Discharge Planning: To Be Determined      Primary Diagnoses: Present on Admission: . Severe sepsis ([place]) . Metastatic anal cancer . Hypotension . Tachycardia . Anemia . Iron deficiency . Malnutrition of moderate degree . Sepsis due to undetermined organism ([place]) . 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 Wound discharge   I have reviewed the medical record, interviewed the Dylan [name] and family, and examined the Dylan [name]. The following aspects are pertinent.  Past Medical History:  Diagnosis Date  . Cancer ([place]) 2018   anal cancer  . Non Hodgkin's lymphoma ([place]) 1999  . S/P radiation therapy 07/07/14-7/20/216   anal ca    Social History   Socioeconomic History  . Marital status: Single    Spouse name: Not on file  . Number of children: Not on file  . Years of education: Not on file  . Highest education level: Not on file  Occupational History  . Not on file  Tobacco Use  . Smoking status: Current Some Day Smoker    Packs/day: 0.30    Types: Cigarettes  . Smokeless tobacco: Current User  Substance and Sexual Activity  . Alcohol use: No  . Drug use: Not on file  . Sexual activity: Not on file  Other Topics Concern  . Not on file  Social History Narrative  . Not on file   Social  Determinants of Health   Financial Resource Strain:   . Difficulty of Paying Living Expenses: Not on file  Food Insecurity:   . Worried About Charity fundraiser in the Last Year: Not on file  . Ran Out of Food in the Last Year: Not on file  Transportation Needs:   . Lack of Transportation (Medical): Not on file  . Lack of Transportation (Non-Medical): Not on file  Physical Activity:   . Days of Exercise per Week: Not on file  . Minutes of Exercise per Session: Not on file  Stress:   . Feeling of Stress : Not on file  Social Connections:   . Frequency of Communication with Friends and Family: Not on file  . Frequency of Social Gatherings with Friends and Family: Not on file  . Attends Religious Services: Not on file  . Active Member of Clubs or Organizations: Not on file  . Attends  Club or Organization Meetings: Not on file  . Marital Status: Not on file   Family History  Problem Relation Age of Onset  . Stroke Mother    Scheduled Meds: . sodium chloride   Intravenous Once  . Chlorhexidine Gluconate Cloth  6 each Topical Daily  . enoxaparin (LOVENOX) injection  40 mg Subcutaneous Q24H  . feeding supplement (ENSURE ENLIVE)  237 mL Oral BID BM  . ferrous sulfate  325 mg Oral Q breakfast  . mouth rinse  15 mL Mouth Rinse BID  . midodrine  5 mg Oral TID WC  . oxyCODONE  20 mg Oral 2 times per day  . oxyCODONE  30 mg Oral Q24H   Continuous Infusions: . sodium chloride 125 mL/hr at 03/10/19 0322  . ceFEPime (MAXIPIME) IV Stopped (03/10/19 0717)  . metronidazole Stopped (03/10/19 [id number])  . vancomycin 1,000 mg (03/10/19 0826)   PRN Meds:.acetaminophen **OR** acetaminophen, Melatonin, ondansetron **OR** ondansetron (ZOFRAN) IV, polyethylene glycol Medications Prior to Admission:  Prior to Admission medications   Medication Sig Start Date End Date Taking? Authorizing Provider  acetaminophen (TYLENOL) 325 MG tablet Take 650 mg by mouth every 6 (six) hours as needed for pain.  11/21/18  Yes [provider]  enoxaparin (LOVENOX) 40 MG/0.4ML injection Inject 40 mg into the skin daily. 01/19/19  Yes [provider]  feeding supplement, ENSURE ENLIVE, (ENSURE ENLIVE) LIQD Take 237 mLs by mouth 2 (two) times daily between meals. 10/21/18  Yes Alma Friendly, MD  ferrous sulfate 325 (65 FE) MG tablet Take 1 tablet (325 mg total) by mouth daily with breakfast. 02/09/19 03/11/19 Yes Adhikari, Amrit, MD  MELATONIN PO Take 1 capsule by mouth at bedtime as needed (sleep).    Yes [provider]  metroNIDAZOLE (METROGEL) 0.75 % gel Apply 1 application topically daily as needed (rosacea).  11/21/18  Yes [provider]  Oxycodone HCl 20 MG TABS Take 20-30 mg by mouth See admin instructions. Take 1 tablet in the morning, take 1.5 tablets at lunch, and take 1 tablet at night 01/09/19  Yes [provider]  midodrine (PROAMATINE) 5 MG tablet Take 1 tablet (5 mg total) by mouth 3 (three) times daily with meals. Dylan [name] not taking: Reported on 03/09/2019 02/09/19   Shelly Coss, MD   No Known Allergies Review of Systems +pain +generalized weakness  Physical Exam Young gentleman, resting in bed however appears to be frail and chronically ill Awake alert oriented however flat in affect and does not engage or interact much answers a few questions appropriately S1-S2 regular Regular work of breathing Does not have edema Dylan [name] has an anal wound with significant purulent drainage as well as foul odor.  Pictures of the wound also reviewed on the medical chart.   Vital Signs: BP (!) 93/57   Pulse 70   Temp 98.9 F (37.2 C)   Resp 11   Ht 6\' 1"  (1.854 m)   Wt 62.1 kg   SpO2 100%   BMI 18.07 kg/m  Pain Scale: 0-10 POSS *See Group Information*: 1-Acceptable,Awake and alert Pain Score: 0-No pain   SpO2: SpO2: 100 % O2 Device:SpO2: 100 % O2 Flow Rate: .   IO: Intake/output summary:   Intake/Output Summary (Last 24 hours) at  03/10/2019 1031 Last data filed at 03/10/2019 0700 Gross per 24 hour  Intake 2738.47 ml  Output 1100 ml  Net 1638.47 ml    LBM: Last BM Date: 03/09/19 Baseline Weight: Weight: 62.1 kg Most recent weight:  Weight: 62.1 kg     Palliative Assessment/Data:   PPS 40%  Time In:  9.20 Time Out:  10.30 Time Total:  70 min.  Greater than 50%  of this time was spent counseling and coordinating care related to the above assessment and plan.  Signed by: Loistine Chance, MD   Please contact Palliative Medicine Team phone at [phone] for questions and concerns.  For individual provider: See Shea Evans

## 2019-03-10 NOTE — Progress Notes (Signed)
CBC ordered, pt received 1 UPRBC this am and did not have follow up CBC per Dr Nevada Crane note.

## 2019-03-10 NOTE — Progress Notes (Signed)
Pt asked to do dressing change in anal area himself to ensure he is doing it correctly. Instructed pt to open up gauze kerlex more and to pack wound entire length.

## 2019-03-10 NOTE — Progress Notes (Signed)
Pt's ostomy bag almost half full, pt refuses to have it emptied at this time. He states it is not full enough and wants to sleep.

## 2019-03-10 NOTE — Consult Note (Addendum)
[facility] Nurse Consult Note: Patient is known to our team from a previous admission last month. Last seen on 02/06/19 by my partner, M. Austin.  Reason for Consult:Recal wound Wound type:nonhealing surgical Pressure Injury POA: Yes/No/NA Measurement:Per Nursing Flow sheet, 21 x 6 x 11cm Wound bed:red, moist Drainage (amount, consistency, odor) moderate light yellow Periwound:intact Dressing procedure/placement/frequency: A twice daily, normal saline moistened roll gauze dressing will be used to dress the rectal wound. This is covered with an ABD pad and secured with paper tape. Dressing changed today by Bedside RN, and her assistance is appreciated.    [facility] Nurse ostomy consult note Stoma type/location: LLQ colostomy Stomal assessment/size: 1 and 1/2 inches round, minimally budded, pink Peristomal assessment: Not seen today. Current pouch is intact. Treatment options for stomal/peristomal skin: Convexity Output: brown stool has been emptied Ostomy pouching: 1pc.with skin barrier ring Education provided: None today Enrolled patient in [place] program: Yes (previously).  Patient is established with a supplier.  [place] nursing team will not follow, but will remain available to this patient, the nursing, surgical and medical teams.  Please re-consult if needed. Thanks, Maudie Flakes, MSN, RN, [place], Arther Abbott  Pager# [phone]

## 2019-03-10 NOTE — Progress Notes (Signed)
Pt having low b/p's 60-70's/30-40's. Pt is alert and oriented and denies dizziness. Order for stat Lactic acid and 1 liter LR received. Will continue to monitor

## 2019-03-10 NOTE — Progress Notes (Signed)
OT Cancellation Note  Patient Details Name: Dylan [name] MRN: [id number] DOB: May 04, 1970   Cancelled Treatment:    Reason Eval/Treat Not Completed: Patient not medically ready(Hgb 6.8. RN reports pt planning to receive blood today. Will hold and return as schedule allows. Thank you.)  [place], OTR/L Acute Rehab Pager: [phone] Office: [phone] 03/10/2019, 10:49 AM

## 2019-03-10 NOTE — Progress Notes (Signed)
PT Cancellation Note  Patient Details Name: Dylan [name] MRN: [id number] DOB: 09-21-1970   Cancelled Treatment:    Reason Eval/Treat Not Completed: Medical issues which prohibited therapy PT eval deferred at this time d/t BP 90s/50s and Hgb 6.8. Continue efforts to complete PT eval. Pt was min/supervision in December, amb short distances with PT.    [name] 03/10/2019, 9:23 AM

## 2019-03-10 NOTE — Progress Notes (Signed)
CRITICAL VALUE ALERT  Critical Value:  LA 2.2  Date & Time Notied:  1/24 0610  Provider Notified:[place]  Orders Received/Actions taken: awaiting orders

## 2019-03-11 DIAGNOSIS — L8990 Pressure ulcer of unspecified site, unspecified stage: Secondary | ICD-10-CM | POA: Insufficient documentation

## 2019-03-11 LAB — CBC WITH DIFFERENTIAL/PLATELET
Abs Immature Granulocytes: 0.06 10*3/uL (ref 0.00–0.07)
Basophils Absolute: 0 10*3/uL (ref 0.0–0.1)
Basophils Relative: 0 %
Eosinophils Absolute: 0.2 10*3/uL (ref 0.0–0.5)
Eosinophils Relative: 1 %
HCT: 27.7 % — ABNORMAL LOW (ref 39.0–52.0)
Hemoglobin: 8.3 g/dL — ABNORMAL LOW (ref 13.0–17.0)
Immature Granulocytes: 1 %
Lymphocytes Relative: 14 %
Lymphs Abs: 1.8 10*3/uL (ref 0.7–4.0)
MCH: 22.7 pg — ABNORMAL LOW (ref 26.0–34.0)
MCHC: 30 g/dL (ref 30.0–36.0)
MCV: 75.7 fL — ABNORMAL LOW (ref 80.0–100.0)
Monocytes Absolute: 1 10*3/uL (ref 0.1–1.0)
Monocytes Relative: 8 %
Neutro Abs: 9.6 10*3/uL — ABNORMAL HIGH (ref 1.7–7.7)
Neutrophils Relative %: 76 %
Platelets: 476 10*3/uL — ABNORMAL HIGH (ref 150–400)
RBC: 3.66 MIL/uL — ABNORMAL LOW (ref 4.22–5.81)
RDW: 22.6 % — ABNORMAL HIGH (ref 11.5–15.5)
WBC: 12.5 10*3/uL — ABNORMAL HIGH (ref 4.0–10.5)
nRBC: 0 % (ref 0.0–0.2)

## 2019-03-11 LAB — TYPE AND SCREEN
ABO/RH(D): O POS
Antibody Screen: NEGATIVE
Unit division: 0

## 2019-03-11 LAB — COMPREHENSIVE METABOLIC PANEL
ALT: 20 U/L (ref 0–44)
AST: 10 U/L — ABNORMAL LOW (ref 15–41)
Albumin: 1.9 g/dL — ABNORMAL LOW (ref 3.5–5.0)
Alkaline Phosphatase: 80 U/L (ref 38–126)
Anion gap: 7 (ref 5–15)
BUN: 9 mg/dL (ref 6–20)
CO2: 24 mmol/L (ref 22–32)
Calcium: 8.4 mg/dL — ABNORMAL LOW (ref 8.9–10.3)
Chloride: 104 mmol/L (ref 98–111)
Creatinine, Ser: 0.56 mg/dL — ABNORMAL LOW (ref 0.61–1.24)
GFR calc Af Amer: >60 mL/min (ref >60)
GFR calc non Af Amer: >60 mL/min (ref >60)
Glucose, Bld: 95 mg/dL (ref 70–99)
Potassium: 4.3 mmol/L (ref 3.5–5.1)
Sodium: 135 mmol/L (ref 135–145)
Total Bilirubin: 0.1 mg/dL — ABNORMAL LOW (ref 0.3–1.2)
Total Protein: 6.2 g/dL — ABNORMAL LOW (ref 6.5–8.1)

## 2019-03-11 LAB — URINALYSIS, ROUTINE W REFLEX MICROSCOPIC
Bilirubin Urine: NEGATIVE
Glucose, UA: NEGATIVE mg/dL
Hgb urine dipstick: NEGATIVE
Ketones, ur: 5 mg/dL — AB
Nitrite: NEGATIVE
Protein, ur: NEGATIVE mg/dL
Specific Gravity, Urine: 1.012 (ref 1.005–1.030)
pH: 6 (ref 5.0–8.0)

## 2019-03-11 LAB — BPAM RBC
Blood Product Expiration Date: 202102222359
ISSUE DATE / TIME: 202101241221
Unit Type and Rh: 5100

## 2019-03-11 LAB — MAGNESIUM: Magnesium: 1.7 mg/dL (ref 1.7–2.4)

## 2019-03-11 LAB — PHOSPHORUS: Phosphorus: 2.8 mg/dL (ref 2.5–4.6)

## 2019-03-11 MED ORDER — PHENYLEPHRINE HCL-NACL 10-0.9 MG/250ML-% IV SOLN: 0.0000 ug/min | INTRAVENOUS | Status: DC

## 2019-03-11 MED ORDER — SODIUM CHLORIDE 0.9 % IV SOLN
250.0000 mL | INTRAVENOUS | Status: DC
  Administered 2019-03-14: 06:00:00 250 mL via INTRAVENOUS

## 2019-03-11 MED ORDER — MIDODRINE HCL 5 MG PO TABS
10.0000 mg | ORAL_TABLET | Freq: Three times a day (TID) | ORAL | Status: DC
  Administered 2019-03-12 – 2019-03-14 (×8): 10 mg via ORAL
  Filled 2019-03-11 (×9): qty 2

## 2019-03-11 MED ORDER — SODIUM CHLORIDE 0.9 % IV BOLUS
1000.0000 mL | Freq: Once | INTRAVENOUS | Status: AC
  Administered 2019-03-11: 18:00:00 1000 mL via INTRAVENOUS

## 2019-03-11 MED ORDER — ADULT MULTIVITAMIN W/MINERALS CH
1.0000 | ORAL_TABLET | Freq: Every day | ORAL | Status: DC
  Administered 2019-03-11 – 2019-03-14 (×4): 1 via ORAL
  Filled 2019-03-11 (×5): qty 1

## 2019-03-11 MED ORDER — ENSURE ENLIVE PO LIQD
237.0000 mL | Freq: Three times a day (TID) | ORAL | Status: DC
  Administered 2019-03-11 – 2019-03-14 (×9): 237 mL via ORAL

## 2019-03-11 MED ORDER — MAGNESIUM SULFATE 2 GM/50ML IV SOLN
2.0000 g | Freq: Once | INTRAVENOUS | Status: AC
  Administered 2019-03-11: 15:00:00 2 g via INTRAVENOUS
  Filled 2019-03-11: qty 50

## 2019-03-11 MED ORDER — PRO-STAT SUGAR FREE PO LIQD
30.0000 mL | Freq: Two times a day (BID) | ORAL | Status: DC
  Administered 2019-03-11 – 2019-03-13 (×4): 30 mL via ORAL
  Filled 2019-03-11 (×3): qty 30

## 2019-03-11 MED ORDER — PHENYLEPHRINE HCL-NACL 10-0.9 MG/250ML-% IV SOLN
25.0000 ug/min | INTRAVENOUS | Status: DC
  Administered 2019-03-11 – 2019-03-12 (×3): 20 ug/min via INTRAVENOUS
  Filled 2019-03-11 (×3): qty 250

## 2019-03-11 NOTE — Progress Notes (Signed)
OT Cancellation Note  Patient Details Name: Dylan [name] MRN: [id number] DOB: 04-04-1970   Cancelled Treatment:    Reason Eval/Treat Not Completed: Medical issues which prohibited therapy  Pt having IV placed and working with RN Kari Baars, [place] [phone] Office- [phone]     Najia Hurlbutt, Edwena Felty D 03/11/2019, 5:00 PM

## 2019-03-11 NOTE — Progress Notes (Signed)
Initial Nutrition Assessment  DOCUMENTATION CODES:   Non-severe (moderate) malnutrition in context of chronic illness  INTERVENTION:  - will increase Ensure Enlive from BID to TID, each supplement provides 350 kcal and 20 grams of protein. - will order 30 mL Prostat BID, each supplement provides 100 kcal and 15 grams of protein. - will order daily multivitamin with minerals.    NUTRITION DIAGNOSIS:   Moderate Malnutrition related to chronic illness, cancer and cancer related treatments as evidenced by mild muscle depletion, moderate fat depletion, moderate muscle depletion.  GOAL:   Patient will meet greater than or equal to 90% of their needs  MONITOR:   PO intake, Supplement acceptance, Labs, Weight trends, Skin  REASON FOR ASSESSMENT:   Malnutrition Screening Tool, Consult Assessment of nutrition requirement/status  ASSESSMENT:   49 y.o. male with medical history of metastatic anal cancer s/p radiation with diverting colostomy and suprapubic catheter placement, chronic anal wound. He presented to the ED with dizziness/lightheadedness and associated decreased appetite over the past 2 days. Patient did report increased purulent drainage from his anal wound. He was admitted 12/21-12/26 for similar episode related to severe sepsis/shock which was thought to be 2/2 his anal wound vs UTI d/t possible fistula formation. He was seen by General Surgery at that time and was deemed not a surgical candidate for any kind of intervention and recommended follow-up with his surgeons at [facility] and [place].  Per flow sheet documentation, patient ate 100% of dinner last night. Patient reports good appetite at baseline but that he noticed marked decline in appetite starting Thursday (1/21) which is the date that he determined he had a blockage near ostomy. He researched techniques on YouTube of how to clear blockage and was subsequently drinking warm beverages and doing abdominal exercises. He states after  drinking full bottle of Ensure this AM he felt much better and now feels that blockage has resolved.   Patient lives with his daughter who makes meals, but he sometimes does not eat what she prepares d/t not liking it. He will order from restaurants he likes, such as [employer], and he does have items on hand such as frozen dinners that he can have if no other options are available.   He was unable to give an example of what a typical day of eating may look like for him, unable to provide examples of foods he would eat consistently.   Per chart review, current weight is 145 lb and this is the highest weight since 11/07/18 when he weighed 146 lb.   Able to talk with RN who confirms patient has been drinking 100% of Ensure supplements and seems to enjoy them. She reports that patient stated not liking the food here and that his daughter has brought in some items for him.  Per notes: - severe sepsis 2/2 anal wound--General Surgery consulted - received 5L IV fluid in the ED - acute blood loss anemia thought to be 2/2 chronic bleeding from anal wound - iron deficiency anemia - severe protein calorie malnutrition - FTT in the setting of metastatic anal cancer s/p diverting colostomy and suprapubic catheter - very poor prognosis--Palliative Care consulted for [place] and saw 1/24; patient remains Full Code   Labs reviewed; creatinine: 0.56 mg/dl, Ca: 8.4 mg/dl. Medications reviewed; 325 mg ferrous sulfate/day, 2 g IV Mg sulfate x1 run 1/25.  IVF; NS @ 150 ml/hr.     NUTRITION - FOCUSED PHYSICAL EXAM:    Most Recent Value  Orbital Region  Moderate depletion  Upper Arm Region  Moderate depletion  Thoracic and Lumbar Region  Unable to assess  Buccal Region  Mild depletion  Temple Region  No depletion  Clavicle Bone Region  Moderate depletion  Clavicle and Acromion Bone Region  Moderate depletion  Scapular Bone Region  Mild depletion  Dorsal Hand  Mild depletion  Patellar Region  Unable to  assess  Anterior Thigh Region  Unable to assess  Posterior Calf Region  Unable to assess  Edema (RD Assessment)  Unable to assess  Hair  Reviewed  Eyes  Reviewed  Mouth  Reviewed  Skin  Reviewed  Nails  Reviewed       Diet Order:   Diet Order            Diet regular Room service appropriate? Yes; Fluid consistency: Thin  Diet effective now              EDUCATION NEEDS:   No education needs have been identified at this time  Skin:  Skin Assessment: Skin Integrity Issues: Skin Integrity Issues:: Stage II, Stage IV Stage II: R groin Stage IV: open anal wound  Last BM:  1/25  Height:   Ht Readings from Last 1 Encounters:  03/09/19 6\' 1"  (1.854 m)    Weight:   Wt Readings from Last 1 Encounters:  03/11/19 65.7 kg    Ideal Body Weight:  83.6 kg  BMI:  Body mass index is 19.11 kg/m.  Estimated Nutritional Needs:   Kcal:  2300-2500 kcal  Protein:  120-130 grams  Fluid:  >/= 2.5 L/day     Jarome Matin, MS, RD, LDN, [facility] Inpatient Clinical Dietitian Pager # [phone] After hours/weekend pager # [phone]

## 2019-03-11 NOTE — Progress Notes (Signed)
HEMATOLOGY-ONCOLOGY PROGRESS NOTE  SUBJECTIVE: Dylan [name] is a 49 year old male followed by Dr. Lorenso Courier at the [facility].  Last seen in our office on 03/04/2019.  He presented to the emergency room with dizziness and lightheadedness.  Admitted with severe sepsis/septic shock and lactic acidosis likely due to his underlying chronic wound versus UTI.  He has been seen by the palliative care team for goals of care discussion.  They have recommended for ongoing aggressive measures.  Reports some mild discomfort in his back today.  Reports that he is trying to eat better.  He denies chest discomfort or shortness of breath.  Denies nausea and vomiting.  Hematological/Oncological History #Metastatic Anal Cancer (summarized history obtained from [facility] Surgical Notes) 1)03/09/10: CT scan performed showed superficial inflammation along the medial crease of the left buttocks within the subcutaneous fat measuring 2.5 x 5 cm including no areas of deep penetration with a few associated bubbles of air and gas present. 2)03/10/10: A 2 x 2 cm area of condylomata was present in the perianal area that was excised by Dr. [name], along with multiple I&Ds of left gluteal abscesses. 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 No lymph nodes biopsied. 3)03/29/10: PET scan showedabnormal 5.7 x 7.1 cm hypermetabolic soft tissue in the perianal region is compatible with patient's history of squamous cell carcinoma.Bilateral inguinal adenopathy, largest on left is hypermetabolic and measures 1.8 x 2.9 cm, and largest on the right measures 1.2 cm without hypermetabolism; question secondary to infection or metastatic involvement 4)04/2010: Evaluated by Radiation Oncologist Dr. [name] at [facility]. He was not deemed a candidate for radiation given concern for metastatic disease based on PET scan. 5)06/22/10: Recommended to begin systemic therapy with  cisplatin and continuous infusion 5-FU for metastatic disease. 6)07/2010: Re-evaluated at [facility] by Dr. [name] and Dr. [name], as he did not have biopsy proven metastatic disease, and inguinal adenopathy may be reactive due to infection/inflammation/abscess. 7)07/22/10: FNA of left inguinal lymph node identified no malignant cells, only polymorphous lymphoid material 8) 07/27/10: exam under anesthesia reveals suprapubic condyloma excision showed condyloma acuminatum with extensive high grade squamous dysplasia with areas of evolution to squamous cell carcinoma in situ, but negative for definite invasion. 9)02/10/14: After lost to follow-up, he returned to [facility] for evaluation by Dr. [name] due to concern for recurrence of disease throughout the perineum and perianal areas. The most worrisome is a large ulcer on the left perianal area. 10)02/18/14: Exam under anesthesia with biopsies performed.Left perianal region biopsy confirmed invasive squamous cell carcinoma, moderate-to-poorly differentiated.Patient refusedat this timeAPR. 11)05/01/14: PET CT: Perianal and left groin/penile malignancy with bilateral inguinal and left external iliac metastases. 12)07/07/14 - 09/03/14: XRT. Patient declined recommended concurrent 5FU/[facility]. At [place]. Patient refused APR. 13)12/19/16: PET CT: No suspicious metabolically active osseous lesions are identified - Interval increase in the extent of the hypermetabolic activity involving the region of the anus which extends up into the fascia adjacent the rectum and out into the bilateral buttocks associated with soft tissue erosion and skin thickening. 14)12/19/16: MRI Pelvis: Motion artefact. Anal tumor with gross invasion of the anal sphincters and extension into the ischioanal fossa, with irregular appearance of the perineum/gluteal skin, also abutting the pelvic sidewall (left > right) and likely invading prostate apex. Overall, this is increased from prior CT  01/05/2015, concerning for disease progression.Exam revealed obliteration of the anus. Patient refused APR. 15)03/2018 - consideration for pembrolizumab - patient declined. 16)10/2018 - represents with substantial  bleeding episodes from erosive, locally-advanced anal cancer 17) 11/12/2018: reviewed by [facility] surgery again. Admitted for consideration of ostomy from 10/4 to 11/21/2018. 18) 12/10/18-12/12/18: patient seen by [facility] (Dr. Dennison Nancy) and Surgical Oncology.  MRI pelvis, and CT chest performed. 19) 12/19/2018: establish care with Dr. Lorenso Courier  20)  12/21/2018: 4th opinion received at [facility], [place] with Dr. [name].  21) 01/17/2019: Underwent a diverting colostomy with placement of a colostomy bag at [facility]. Tumor tissue excised for pathological review.  22) 12/21-12/26/2020: admitted to [facility] for marked anemia, weakness and hypotension.  #High Grade Non-Hodgkins Lymphoma 1)1999: He was treated for high-grade Non-Hodgkin's Lymphoma by Dr. [name] in [place], [place]. He received a year of chemotherapy. No radiation.  REVIEW OF SYSTEMS:   Remainder of the review of systems was noncontributory except as noted in the HPI.  PHYSICAL EXAMINATION: ECOG PERFORMANCE STATUS: 4 - Bedbound  Vitals:   03/11/19 1200 03/11/19 1244  BP: (!) 94/59   Pulse: 100   Resp: 11   Temp:  98 F (36.7 C)  SpO2: 100%    Filed Weights   03/09/19 1434 03/11/19 0400  Weight: 137 lb (62.1 kg) 144 lb 13.5 oz (65.7 kg)    Intake/Output from previous day: 01/24 0701 - 01/25 0700 In: 3410.4 [P.O.:480; I.V.:1499.5; Blood:410; IV Piggyback:1020.9] Out: [id number] [Urine:1375; Stool:350]  GENERAL: Chronically ill-appearing male, no distress SKIN: skin color, texture, turgor are normal, no rashes or significant lesions.  Wound not examined today. EYES: normal, Conjunctiva are pink and non-injected, sclera clear OROPHARYNX:no exudate, no erythema and lips, buccal mucosa, and  tongue normal  NECK: supple, thyroid normal size, non-tender, without nodularity LYMPH:  no palpable lymphadenopathy in the cervical, axillary or inguinal LUNGS: clear to auscultation and percussion with normal breathing effort HEART: regular rate & rhythm and no murmurs and no lower extremity edema ABDOMEN: Soft, nontender, ostomy in left abdomen Musculoskeletal:no cyanosis of digits and no clubbing  NEURO: alert & oriented x 3 with fluent speech, no focal motor/sensory deficits  LABORATORY DATA:  I have reviewed the data as listed CMP Latest Ref Rng & Units 03/11/2019 03/10/2019 03/09/2019  Glucose 70 - 99 mg/dL 95 120(H) 89  BUN 6 - 20 mg/dL 9 9 12   Creatinine 0.61 - 1.24 mg/dL 0.56(L) 0.61 0.86  Sodium 135 - 145 mmol/L 135 135 133(L)  Potassium 3.5 - 5.1 mmol/L 4.3 4.0 3.5  Chloride 98 - 111 mmol/L 104 106 102  CO2 22 - 32 mmol/L 24 22 22   Calcium 8.9 - 10.3 mg/dL 8.4(L) 8.9 9.2  Total Protein 6.5 - 8.1 g/dL 6.2(L) 6.8 7.7  Total Bilirubin 0.3 - 1.2 mg/dL 0.1(L) 0.2(L) 0.2(L)  Alkaline Phos 38 - 126 U/L 80 99 125  AST 15 - 41 U/L 10(L) 21 44(H)  ALT 0 - 44 U/L 20 31 36    Lab Results  Component Value Date   WBC 12.5 (H) 03/11/2019   HGB 8.3 (L) 03/11/2019   HCT 27.7 (L) 03/11/2019   MCV 75.7 (L) 03/11/2019   PLT 476 (H) 03/11/2019   NEUTROABS 9.6 (H) 03/11/2019    CT ABDOMEN PELVIS W CONTRAST  Result Date: 03/09/2019 CLINICAL DATA:  Weakness and dizziness. History of non-Hodgkin's lymphoma as well as anal cancer. Surgical history of diverting colostomy. 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 EXAM: CT ABDOMEN AND PELVIS WITH CONTRAST TECHNIQUE: Multidetector CT imaging of the abdomen and pelvis was performed using the standard protocol following bolus  administration of intravenous contrast. CONTRAST:  158mL OMNIPAQUE IOHEXOL 300 MG/ML  SOLN COMPARISON:  CT abdomen dated 02/04/2019. FINDINGS: Lower chest: The majority of the previously described  pulmonary nodules at the lung bases have increased in size, again highly suggestive of metastatic disease. Hepatobiliary: No focal liver abnormality is seen. Gallbladder is decompressed. No bile duct dilatation seen. Pancreas: Unremarkable. No pancreatic ductal dilatation or surrounding inflammatory changes. Spleen: Normal in size without focal abnormality. Adrenals/Urinary Tract: Adrenal glands appear normal. Kidneys are unremarkable without mass, stone or hydronephrosis. Bladder walls are circumferentially thickened, similar to the previous exam. Suprapubic catheter in place. Stomach/Bowel: No significant change in the sizes of the 2 previously described masses at the inferior and superior aspects of a large cavity in the perineal region (images 77 and 95). No obstruction or other complicating feature seen at the LEFT lower quadrant ostomy site. No dilated large or small bowel loops. Vascular/Lymphatic: Aortic atherosclerosis. No acute appearing vascular abnormality. Mildly prominent lymph nodes within the periaortic retroperitoneum and along the proximal iliac chain regions, suspicious for metastatic lymphadenopathy. Reproductive: Prostate gland is obscured. Other: No intraperitoneal free fluid or abscess collection identified. No free intraperitoneal air. Musculoskeletal: Erosive changes along the inferior and posterior margins of the LEFT inferior pubic ramus, possibly post treatment change, suspicious for local osseous metastasis. Stable lesions within the L3 vertebral body RIGHT iliac bone, as previously described, again suggestive of treated osseous metastases. No new osseous finding. IMPRESSION: 1. No acute findings within the abdomen or pelvis. No appreciable change compared to the earlier CT abdomen of 02/04/2019. 2. The majority of the previously described pulmonary nodules at the lung bases have increased in size, compatible with pulmonary metastases. 3. No significant change in the sizes of the 2  previously described masses at the inferior and superior aspects of a large cavity in the perineal region, compatible with patient's previously described known anal malignancy. 4. Mildly prominent lymph nodes within the periaortic retroperitoneum and along the proximal iliac chain regions, compatible with previously described metastatic lymphadenopathy. 5. Erosive changes along the inferior and posterior margins of the LEFT inferior pubic ramus, possibly post treatment change, suspicious for local osseous metastasis. Stable lesions within the L3 vertebral body RIGHT iliac bone, again suggestive of treated osseous metastases. No new osseous finding. 6. Stable circumferential thickening of the walls of the bladder, again compatible with marked cystitis. Suprapubic Foley catheter remains in place. Electronically Signed   By: Franki Cabot M.D.   On: 03/09/2019 17:07   DG Chest Port 1 View  Result Date: 03/09/2019 CLINICAL DATA:  Dizziness for 2 days.  Possible sepsis. EXAM: PORTABLE CHEST 1 VIEW COMPARISON:  Single-view of the chest 02/04/2019, 11/07/2018. PET CT scan 03/135/2020. FINDINGS: Tiny nodular opacities in the right upper lobe are unchanged. No consolidative process, pneumothorax or effusion. Heart size is normal. IMPRESSION: No change in tiny right upper lobe nodular opacities. No acute disease. Electronically Signed   By: Inge Rise M.D.   On: 03/09/2019 15:05    ASSESSMENT AND PLAN: Dylan [name] y.o.malewith medical history significant for non-Hodgkin lymphoma (treated in 1999 with chemo alone) who presents for follow up of metastatic anal cancer. His course has been markedly complicated and handled at a multitude of different institutions.   Mr. Olesen was admitted to [facility] long [facility] from 02/04/2019 until 02/09/2019 for weakness and hypotension.  During the hospitalization he was evaluated by surgery who determined that his wound required no further surgical intervention. This  is similar  to the viewpoint shared by the surgeons at [facility]. Given this it is apparent that we will not likely be able to get good control of the bleeding that he is having from this wound.  As such his hemoglobin will not be able to improve to normal levels and treatment will be markedly difficult.  I am also concerned that in the event he received treatment with immunotherapy his immune system would be weakened and he would get infection of the open wound.  He is now readmitted for sepsis.  It is unlikely that there would be much utility in the use of immunotherapy given his markedly poor functional status.  I noted to the patient that if we were able to improve his blood counts energy level and mobility that immunotherapy would be reconsidered.  I noted to him that in his current state immunotherapy would not be a viable option.  He voiced his understanding of these findings.    We have previously discussed using IV iron, the patient has declined.  We can again consider this once his acute infection resolves.  He has a repeat PET scan scheduled for later this month.  #Metastatic Anal Cancer --will require new PET CT as restaging.   PET scan scheduled for later this month. -- will optimize patient for immunotherapy treatment by replenishing his attempting to increase iron stores and increasing his blood counts.  We will rediscuss use of IV iron once he recovers from his acute infection.  #Iron Deficiency Anemia Secondary to Blood Loss --patient has declined to start IV iron.  Recommend continuation of oral iron.  --Ferritin rechecked on 1/23/thousand 21 was normal and iron and percent saturation were low.  Ferritin likely falsely elevated in the setting of inflammation from the wound.  --patient will require transfusion if Hgb drops below 7.0.    #Symptom Management --patient currently has a weekly visit from palliative care and two weekly visits from home health  nursing. --Continue current pain medications. --continue to monitor    LOS: 2 days   Mikey Bussing, DNP, AGPCNP-BC, AOCNP 03/11/19

## 2019-03-11 NOTE — Evaluation (Signed)
Physical Therapy Evaluation Patient Details Name: Dylan [name] MRN: [id number] DOB: May 15, 1970 Today's Date: 03/11/2019   History of Present Illness  Pt is a 49 y.o. male with medical history significant of metastatic anal cancer status post radiation with diverting colostomy and suprapubic catheter placement; chronic anal wound who presents to the ED with dizziness/lightheadedness and associated decreased appetite over the past 2 days. CT suggestive of osseous metastases to pelvis and L3 vertebral body. Pt admitted with severe sepsis  Clinical Impression  Pt admitted with above diagnosis. Pt able to transfer with  Min guard.  Unable to tolerate sitting due to sacral wound.  Pt ambulating a few feet in room but did not want to go further because did not want wound to start bleeding.  Overall, pt min guard and steady with balance. Pt currently with functional limitations due to the deficits listed below (see PT Problem List). Pt will benefit from skilled PT to increase their independence and safety with mobility to allow discharge to the venue listed below.       Follow Up Recommendations Home health PT(Pt getting HH prior to admission - continue)    Equipment Recommendations  None recommended by PT    Recommendations for Other Services       Precautions / Restrictions Precautions Precautions: Fall      Mobility  Bed Mobility Overal bed mobility: Needs Assistance Bed Mobility: Supine to Sit     Supine to sit: Min guard     General bed mobility comments: use of bedrails  Transfers Overall transfer level: Needs assistance Equipment used: None Transfers: Sit to/from Stand Sit to Stand: Min guard;From elevated surface            Ambulation/Gait Ambulation/Gait assistance: Min guard Gait Distance (Feet): 5 Feet Assistive device: None Gait Pattern/deviations: Decreased stride length     General Gait Details: gait limited due to pt concerned about wound starting to  bleed  Stairs            Wheelchair Mobility    Modified Rankin (Stroke Patients Only)       Balance Overall balance assessment: Needs assistance Sitting-balance support: No upper extremity supported;Feet supported Sitting balance-Leahy Scale: Normal     Standing balance support: Single extremity supported;During functional activity Standing balance-Leahy Scale: Good                               Pertinent Vitals/Pain Pain Assessment: No/denies pain    Home Living Family/patient expects to be discharged to:: Private residence Living Arrangements: Children Available Help at Discharge: Family;Available PRN/intermittently Type of Home: House Home Access: Stairs to enter Entrance Stairs-Rails: None Entrance Stairs-Number of Steps: 5 Home Layout: One level Home Equipment: Walker - 2 wheels;Shower seat;Grab bars - tub/shower      Prior Function Level of Independence: Needs assistance   Gait / Transfers Assistance Needed: Short community ambulation with RW and household no AD  ADL's / Homemaking Assistance Needed: Sister helps with showers but independent with other ADLs; family assist with IADLs        Hand Dominance        Extremity/Trunk Assessment   Upper Extremity Assessment Upper Extremity Assessment: Overall WFL for tasks assessed    Lower Extremity Assessment Lower Extremity Assessment: Overall WFL for tasks assessed    Cervical / Trunk Assessment Cervical / Trunk Assessment: Normal  Communication   Communication: No difficulties  Cognition Arousal/Alertness: Awake/alert Behavior  During Therapy: WFL for tasks assessed/performed Overall Cognitive Status: Within Functional Limits for tasks assessed                                        General Comments General comments (skin integrity, edema, etc.): vss    Exercises     Assessment/Plan    PT Assessment Patient needs continued PT services  PT Problem List  Decreased strength;Decreased mobility;Decreased activity tolerance;Decreased balance;Decreased knowledge of use of DME       PT Treatment Interventions DME instruction;Therapeutic activities;Gait training;Therapeutic exercise;Patient/family education;Stair training;Balance training;Functional mobility training    PT Goals (Current goals can be found in the Care Plan section)  Acute Rehab PT Goals Patient Stated Goal: return home PT Goal Formulation: With patient Time For Goal Achievement: 03/25/19 Potential to Achieve Goals: Good    Frequency Min 3X/week   Barriers to discharge        Co-evaluation               AM-PAC PT "6 Clicks" Mobility  Outcome Measure Help needed turning from your back to your side while in a flat bed without using bedrails?: None Help needed moving from lying on your back to sitting on the side of a flat bed without using bedrails?: None Help needed moving to and from a bed to a chair (including a wheelchair)?: None Help needed standing up from a chair using your arms (e.g., wheelchair or bedside chair)?: None Help needed to walk in hospital room?: None Help needed climbing 3-5 steps with a railing? : A Little 6 Click Score: 23    End of Session   Activity Tolerance: Patient tolerated treatment well Patient left: in bed;with call bell/phone within reach;with bed alarm set Nurse Communication: Mobility status PT Visit Diagnosis: Other abnormalities of gait and mobility (R26.89)    Time: [id number] PT Time Calculation (min) (ACUTE ONLY): 30 min   Charges:   PT Evaluation $PT Eval Low Complexity: 1 Low          Maggie Font, PT Acute Rehab Services Pager [phone] [facility] [phone] [facility] [phone]   Karlton Lemon 03/11/2019, 2:05 PM

## 2019-03-11 NOTE — Progress Notes (Signed)
PROGRESS NOTE  Dylan [name] [id number] DOB: [birth year] DOA: 03/09/2019 PCP: [name], MD  HPI/Recap of past 24 hours: Dylan [name] is a 49 y.o. male with medical history significant of metastatic anal cancer status post radiation with diverting colostomy and suprapubic catheter placement; chronic anal wound who presents to the ED with dizziness/lightheadedness and associated decreased appetite over the past 2 days.  Patient does report increased purulent drainage from his anal wound.  No other specific complaints at this time.  Admitted recently from 02/04/2019 through 02/09/2019 for similar episode related to severe sepsis/shock which was thought to be secondary to his anal wound versus UTI due to possible fistula formation.  He completed broad-spectrum antibiotics, did require vasopressors, Florinef hydrocortisone.  He was seen by general surgery at that time and was deemed not a surgical candidate for any kind of intervention and recommended follow-up with his surgeons at [facility].  He was then discharged on midodrine, which he states is unaware of this medication and likely has not been taking.  ED Course: Temperature 98.4, HR 99, RR 15, BP 72/54, SPO2 100% on room air.  Hemoglobin WBC count 16.7, hemoglobin 7.4, platelet 487.  Sodium 133, potassium 3.5, chloride 102, CO2 22, BUN 12, creatinine 0.86, glucose 89.  AST 44, ALT 36.  INR 1.2.  Lactic acid 2.4.  Chest x-ray with no acute cardiopulmonary disease process.  Urinalysis, urine culture, blood culture: Pending.  Patient received Flagyl, vancomycin, cefepime, lactated Ringer's x2 with improvement of his blood pressure to 97/69.  ED physician referred for admission for severe sepsis likely 2/2 to chronic anal wound.  Wound cx not warranted as will likely be polymicrobial per general surgery.  On empiric broad spectrum abx.  03/11/27: Seen and examined.  No acute events overnight.  Main complaint is pain control.  Patient and  family have requested oncology evaluation for second opinion on his prognosis.  Discussed with Dr. Jana Hakim on 03/10/19 and discussed with Dr. Burr Medico today.  Appreciate oncology and palliative care team assistance.    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 General surgery was consulted 03/10/19.  Not a surgical candidate. Patient had previously not been a surgical candidate per medical records review Currently on normal saline at 150 cc/h in addition to midodrine 10 mg 3 times daily, maintaining MAP greater than 65 Currently on cefepime, IV Flagyl, and IV vancomycin, continue day #3 Continue to closely monitor in stepdown unit  Acute blood loss anemia likely secondary to chronic bleed from anal wound Hemoglobin dropped to 6.8 on 03/10/2019, transfuse 1 unit PRBC. New baseline hemoglobin appears to be 8 with low MCV 71. Hemoglobin this morning 8.3.  Iron deficiency anemia Per medical review patient has refused IV iron infusion at oncology's office Iron studies suggestive of iron deficiency Continue p.o. iron supplement  We will transfuse IV Feraheme 510 mg once if patient is agreeable  Severe protein calorie malnutrition Albumin 2.0 Severe muscle mass loss Continue oral supplement Dietitian consulted,  appreciate recommendations.  Failure to thrive in the setting of metastatic anal cancer Encourage oral intake as tolerated  Chronic anal wound Patient with chronic wound, reports packing changed daily.  It is very necrotic and foul-smelling. --CT abdomen/pelvis as above for further evaluation to ensure no significant abscess formation.  Please review radiology's report. --Wound care consult for evaluation and treatment recommendations --Continue empiric antibiotics as above  Metastatic anal cancer status post diverting colostomy and suprapubic catheter Hx high-grade non-Hodgkin's lymphoma Per previous general surgery notes, he is not a candidate for any other surgical intervention, and currently not a candidate for immunotherapy per oncology secondary to his poor functional status. --Continue outpatient follow-up with oncology and surgery with [facility] --Wound care/ostomy for continued colostomy care while inpatient --Will consult palliative care for assistance with goals of care, medical decision-making and family support given overall extremely poor prognosis. Patient and family have requested oncology evaluation for second opinion on his prognosis. Discussed with Dr. Jana Hakim on 03/10/19 and discussed with Dr. Burr Medico today.  Appreciate oncology and palliative care team assistance.  Goals of care Patient has a very poor prognosis Has been followed by palliative outpatient per medical records reviewed Palliative care team has been consulted to assist with establishing goals of care. Currently full code.   DVT prophylaxis: Lovenox subcu daily Code Status: Full code Family Communication:  None at bedside. Disposition Plan:  Patient is from home.  Plan is to discharge to home with home health services.  Barriers to discharge severe sepsis needing IV antibiotics and IV fluids.    Consults called: Palliative care, general surgery, oncology.     Objective: Vitals:   03/11/19 0800  03/11/19 0900 03/11/19 1000 03/11/19 1100  BP: 95/62 (!) 92/59 (!) 83/40 121/63  Pulse: 94 88 97 (!) 106  Resp: 16 (!) 23 13 13   Temp:      TempSrc:      SpO2: 100% 99% 99% 94%  Weight:      Height:        Intake/Output Summary (Last 24 hours) at 03/11/2019 1207 Last data filed at 03/11/2019 1113 Gross per 24 hour  Intake 4547.06 ml  Output 2525 ml  Net 2022.06 ml   Filed Weights   03/09/19 1434 03/11/19 0400  Weight: 62.1 kg 65.7 kg    Exam:  . General: 49 y.o. year-old male very frail-appearing no acute distress.  Appears uncomfortable due to anal pain.  Alert and oriented x3. . Cardiovascular: Regular rate and rhythm no rubs or gallops. Marland Kitchen Respiratory: Clear to auscultation no wheezes or rales.   . Abdomen: Soft nontender nondistended normal bowel sounds present suprapubic catheter in place.   . Musculoskeletal: No lower extremity edema bilaterally.. . Skin: Anal wound packed. Psychiatry: Mood is appropriate for condition and setting.  Data Reviewed: CBC: Recent Labs  Lab 03/09/19 1422 03/10/19 0519 03/10/19 2143 03/11/19 0259  WBC 16.7* 13.4* 11.5* 12.5*  NEUTROABS 13.9*  --   --  9.6*  HGB 7.4* 6.8* 8.0* 8.3*  HCT 24.9* 23.6* 27.3* 27.7*  MCV 71.8* 72.8* 76.3* 75.7*  PLT 487* 474* 515* 0000000*   Basic Metabolic Panel: Recent Labs  Lab 03/09/19 1422 03/10/19 0519 03/11/19 0259  NA 133* 135 135  K 3.5 4.0 4.3  CL 102 106 104  CO2 22 22 24   GLUCOSE  89 120* 95  BUN 12 9 9   CREATININE 0.86 0.61 0.56*  CALCIUM 9.2 8.9 8.4*  MG  --  1.8 1.7  PHOS  --   --  2.8   GFR: Estimated Creatinine Clearance: 102.7 mL/min (A) (by C-G formula based on SCr of 0.56 mg/dL (L)). Liver Function Tests: Recent Labs  Lab 03/09/19 1422 03/10/19 0519 03/11/19 0259  AST 44* 21 10*  ALT 36 31 20  ALKPHOS 125 99 80  BILITOT 0.2* 0.2* 0.1*  PROT 7.7 6.8 6.2*  ALBUMIN 2.3* 2.0* 1.9*   No results for input(s): LIPASE, AMYLASE in the last 168 hours. No results for  input(s): AMMONIA in the last 168 hours. Coagulation Profile: Recent Labs  Lab 03/09/19 1422  INR 1.2   Cardiac Enzymes: No results for input(s): CKTOTAL, CKMB, CKMBINDEX, TROPONINI in the last 168 hours. BNP (last 3 results) No results for input(s): PROBNP in the last 8760 hours. HbA1C: No results for input(s): HGBA1C in the last 72 hours. CBG: No results for input(s): GLUCAP in the last 168 hours. Lipid Profile: No results for input(s): CHOL, HDL, LDLCALC, TRIG, CHOLHDL, LDLDIRECT in the last 72 hours. Thyroid Function Tests: No results for input(s): TSH, T4TOTAL, FREET4, T3FREE, THYROIDAB in the last 72 hours. Anemia Panel: Recent Labs    03/09/19 2003  VITAMINB12 357  FOLATE 12.0  FERRITIN 33  TIBC 174*  IRON 21*  RETICCTPCT 0.8   Urine analysis:    Component Value Date/Time   COLORURINE YELLOW 02/04/2019 2030   APPEARANCEUR TURBID (A) 02/04/2019 2030   LABSPEC 1.020 02/04/2019 2030   PHURINE 6.0 02/04/2019 2030   GLUCOSEU NEGATIVE 02/04/2019 2030   [place] (A) 02/04/2019 2030   [place] NEGATIVE 02/04/2019 2030   [place] 02/04/2019 2030   PROTEINUR >=300 (A) 02/04/2019 2030   NITRITE NEGATIVE 02/04/2019 2030   LEUKOCYTESUR LARGE (A) 02/04/2019 2030   Sepsis Labs: @LABRCNTIP (procalcitonin:4,lacticidven:4)  ) Recent Results (from the past 240 hour(s))  Blood Culture (routine x 2)     Status: None (Preliminary result)   Collection Time: 03/09/19  2:22 PM   Specimen: Right Antecubital; Blood  Result Value Ref Range Status   Specimen Description   Final    RIGHT ANTECUBITAL Performed at [facility], [place] [address] [place], [place] [address]    Special Requests   Final    BOTTLES DRAWN AEROBIC AND ANAEROBIC Blood Culture adequate volume Performed at [place] [address], [place], [place] [address]    Culture   Final    NO GROWTH 2 DAYS Performed at [facility] [address], [place], [place] [address]    Report Status PENDING  Incomplete  Blood Culture (routine x 2)     Status: None (Preliminary result)   Collection Time: 03/09/19  2:23 PM   Specimen: BLOOD LEFT HAND  Result Value Ref Range Status   Specimen Description   Final    BLOOD LEFT HAND Performed at [facility] [address], [place], [place] [address]    Special Requests   Final    BOTTLES DRAWN AEROBIC AND ANAEROBIC Blood Culture results may not be optimal due to an inadequate volume of blood received in culture bottles Performed at [facility] [address] [place], [place] [address]    Culture   Final    NO GROWTH 2 DAYS Performed at [facility] [address], [place], [place]  [id number]    Report Status PENDING  Incomplete  SARS CORONAVIRUS 2 (TAT 6-24 HRS) Nasopharyngeal Nasopharyngeal Swab     Status: None   Collection Time: 03/09/19  6:32 PM   Specimen: Nasopharyngeal Swab  Result Value Ref Range Status   SARS Coronavirus 2 NEGATIVE NEGATIVE Final    Comment: (NOTE) SARS-CoV-2 target nucleic acids are NOT DETECTED. The SARS-CoV-2 RNA is generally detectable in upper and lower respiratory specimens during the acute phase of infection. Negative results do not preclude SARS-CoV-2 infection, do not rule out co-infections with other pathogens, and should not be used as the sole basis for treatment or other patient management decisions. Negative results must be combined with clinical observations, patient history, and epidemiological information. The expected result is Negative. Fact Sheet for Patients: SugarRoll.be Fact Sheet for Healthcare Providers: https://www.woods-mathews.com/ This test is not yet approved or cleared by the [place] FDA and  has been authorized for detection and/or diagnosis of SARS-CoV-2 by FDA under an Emergency Use Authorization (EUA). This EUA will remain  in  effect (meaning this test can be used) for the duration of the COVID-19 declaration under Section 56 4(b)(1) of the Act, 21 U.S.C. section 360bbb-3(b)(1), unless the authorization is terminated or revoked sooner. Performed at [facility], [place] [address] [place], [place] [address]   MRSA PCR Screening     Status: None   Collection Time: 03/09/19  6:50 PM   Specimen: Nasopharyngeal  Result Value Ref Range Status   MRSA by PCR NEGATIVE NEGATIVE Final    Comment:        The GeneXpert MRSA Assay (FDA approved for NASAL specimens only), is one component of a comprehensive MRSA colonization surveillance program. It is not intended to diagnose MRSA infection nor to guide or monitor treatment for MRSA infections. Performed at [facility], [place] [address] [place], [place] [address]       Studies: No results found.  Scheduled Meds: . Chlorhexidine Gluconate Cloth  6 each Topical Daily  . enoxaparin (LOVENOX) injection  40 mg Subcutaneous Q24H  . feeding supplement (ENSURE ENLIVE)  237 mL Oral BID BM  . ferrous sulfate  325 mg Oral Q breakfast  . mouth rinse  15 mL Mouth Rinse BID  . midodrine  10 mg Oral TID WC  . oxyCODONE  20 mg Oral 2 times per day  . oxyCODONE  30 mg Oral Q24H    Continuous Infusions: . sodium chloride 150 mL/hr at 03/11/19 0831  . ceFEPime (MAXIPIME) IV Stopped (03/11/19 0728)  . metronidazole Stopped (03/11/19 [id number])  . vancomycin Stopped (03/11/19 1028)     LOS: 2 days     Kayleen Memos, MD [facility] Pager [phone]  If 7PM-7AM, please contact night-coverage [email or web address] Password [facility] 03/11/2019, 12:07 PM

## 2019-03-12 LAB — CBC WITH DIFFERENTIAL/PLATELET
Abs Immature Granulocytes: 0.04 10*3/uL (ref 0.00–0.07)
Basophils Absolute: 0 10*3/uL (ref 0.0–0.1)
Basophils Relative: 0 %
Eosinophils Absolute: 0.1 10*3/uL (ref 0.0–0.5)
Eosinophils Relative: 2 %
HCT: 27.6 % — ABNORMAL LOW (ref 39.0–52.0)
Hemoglobin: 8.4 g/dL — ABNORMAL LOW (ref 13.0–17.0)
Immature Granulocytes: 0 %
Lymphocytes Relative: 18 %
Lymphs Abs: 1.6 10*3/uL (ref 0.7–4.0)
MCH: 22.6 pg — ABNORMAL LOW (ref 26.0–34.0)
MCHC: 30.4 g/dL (ref 30.0–36.0)
MCV: 74.2 fL — ABNORMAL LOW (ref 80.0–100.0)
Monocytes Absolute: 0.8 10*3/uL (ref 0.1–1.0)
Monocytes Relative: 9 %
Neutro Abs: 6.4 10*3/uL (ref 1.7–7.7)
Neutrophils Relative %: 71 %
Platelets: 511 10*3/uL — ABNORMAL HIGH (ref 150–400)
RBC: 3.72 MIL/uL — ABNORMAL LOW (ref 4.22–5.81)
RDW: 23 % — ABNORMAL HIGH (ref 11.5–15.5)
WBC: 9.1 10*3/uL (ref 4.0–10.5)
nRBC: 0 % (ref 0.0–0.2)

## 2019-03-12 LAB — COMPREHENSIVE METABOLIC PANEL
ALT: 16 U/L (ref 0–44)
AST: 8 U/L — ABNORMAL LOW (ref 15–41)
Albumin: 1.9 g/dL — ABNORMAL LOW (ref 3.5–5.0)
Alkaline Phosphatase: 71 U/L (ref 38–126)
Anion gap: 6 (ref 5–15)
BUN: 9 mg/dL (ref 6–20)
CO2: 26 mmol/L (ref 22–32)
Calcium: 8.4 mg/dL — ABNORMAL LOW (ref 8.9–10.3)
Chloride: 103 mmol/L (ref 98–111)
Creatinine, Ser: 0.53 mg/dL — ABNORMAL LOW (ref 0.61–1.24)
GFR calc Af Amer: >60 mL/min (ref >60)
GFR calc non Af Amer: >60 mL/min (ref >60)
Glucose, Bld: 88 mg/dL (ref 70–99)
Potassium: 3.8 mmol/L (ref 3.5–5.1)
Sodium: 135 mmol/L (ref 135–145)
Total Bilirubin: 0.1 mg/dL — ABNORMAL LOW (ref 0.3–1.2)
Total Protein: 6.5 g/dL (ref 6.5–8.1)

## 2019-03-12 LAB — URINE CULTURE: Culture: <10000 — AB

## 2019-03-12 MED ORDER — SODIUM CHLORIDE 0.9 % IV SOLN: INTRAVENOUS | Status: AC

## 2019-03-12 NOTE — Evaluation (Signed)
Occupational Therapy Evaluation Patient Details Name: Dylan [name] MRN: [id number] DOB: Sep 12, 1970 Today's Date: 03/12/2019    History of Present Illness Pt is a 49 y.o. male with medical history significant of metastatic anal cancer status post radiation with diverting colostomy and suprapubic catheter placement; chronic anal wound who presents to the ED with dizziness/lightheadedness and associated decreased appetite over the past 2 days. CT suggestive of osseous metastases to pelvis and L3 vertebral body. Pt admitted with severe sepsis   Clinical Impression   Pt was admitted for the above.  He feels near his baseline, which is independent. PT currently at set up/min guard level. His daughter lives with him but works and neighbor is available to help if needed. Will follow in acute setting with min guard level goals    Follow Up Recommendations  Supervision - Intermittent    Equipment Recommendations  None recommended by OT    Recommendations for Other Services       Precautions / Restrictions Precautions Precautions: Fall Restrictions Weight Bearing Restrictions: No      Mobility Bed Mobility               General bed mobility comments: supervision for lines  Transfers                 General transfer comment: min guard without device, per PT    Balance                                           ADL either performed or assessed with clinical judgement   ADL Overall ADL's : Needs assistance/impaired Eating/Feeding: Independent                                     General ADL Comments: Pt had just returned to bed after walking a short distance in room with RN.  Based on clinical judgment, pt is able to perform ADLs with set up and min guard for standing.  He crosses legs for adls     Vision         Perception     Praxis      Pertinent Vitals/Pain Pain Assessment: No/denies pain(propped on R side sitting)      Hand Dominance     Extremity/Trunk Assessment Upper Extremity Assessment Upper Extremity Assessment: Overall WFL for tasks assessed           Communication Communication Communication: No difficulties   Cognition Arousal/Alertness: Awake/alert Behavior During Therapy: WFL for tasks assessed/performed Overall Cognitive Status: Within Functional Limits for tasks assessed                                     General Comments  vss    Exercises     Shoulder Instructions      Home Living Family/patient expects to be discharged to:: Private residence Living Arrangements: Children(daughter, who works) Available Help at Discharge: Family;Neighbor;Available PRN/intermittently Type of Home: House             Bathroom Shower/Tub: Teacher, early years/pre: Standard     Home Equipment: Environmental consultant - 2 wheels;Shower seat;Grab bars - tub/shower   Additional Comments: has been standing in shower  Prior Functioning/Environment          Comments: pt states he has been performing his own adls; stands in shower as sitting is very uncomfortable        OT Problem List: Decreased activity tolerance;Pain;Decreased knowledge of use of DME or AE      OT Treatment/Interventions: Self-care/ADL training;DME and/or AE instruction;Patient/family education    OT Goals(Current goals can be found in the care plan section) Acute Rehab OT Goals Patient Stated Goal: return home OT Goal Formulation: With patient Time For Goal Achievement: 03/26/19 Potential to Achieve Goals: Good ADL Goals Additional ADL Goal #1: pt will perform adl, toilet and shower transfers at mod I level  OT Frequency: Min 2X/week   Barriers to D/C:            Co-evaluation              AM-PAC OT "6 Clicks" Daily Activity     Outcome Measure Help from another person eating meals?: None Help from another person taking care of personal grooming?: A Little Help from  another person toileting, which includes using toliet, bedpan, or urinal?: A Little Help from another person bathing (including washing, rinsing, drying)?: A Little Help from another person to put on and taking off regular upper body clothing?: A Little Help from another person to put on and taking off regular lower body clothing?: A Little 6 Click Score: 19   End of Session    Activity Tolerance: Patient tolerated treatment well Patient left: in bed;with call bell/phone within reach  OT Visit Diagnosis: Unsteadiness on feet (R26.81)                Time: [id number] OT Time Calculation (min): 11 min Charges:  OT General Charges $OT Visit: 1 Visit OT Evaluation $OT Eval Low Complexity: 1 Low  Noris Kulinski S, OTR/L Acute Rehabilitation Services 03/12/2019  [place] 03/12/2019, 3:16 PM

## 2019-03-12 NOTE — Progress Notes (Signed)
PROGRESS NOTE  ESAIAS [name] [id number] DOB: 12/18/1970 DOA: 03/09/2019 PCP: [name], MD  HPI/Recap of past 24 hours: Dylan [name] is a 49 y.o. male with medical history significant of metastatic anal cancer status post radiation with diverting colostomy and suprapubic catheter placement; chronic anal wound who presents to the ED with dizziness/lightheadedness and associated decreased appetite over the past 2 days.  Patient does report increased purulent drainage from his anal wound.  No other specific complaints at this time.  Admitted recently from 02/04/2019 through 02/09/2019 for similar episode related to severe sepsis/shock which was thought to be secondary to his anal wound versus UTI due to possible fistula formation.  He completed broad-spectrum antibiotics, did require vasopressors, Florinef hydrocortisone.  He was seen by general surgery at that time and was deemed not a surgical candidate for any kind of intervention and recommended follow-up with his surgeons at [facility].  He was then discharged on midodrine, which he states is unaware of this medication and likely has not been taking.  ED Course: Temperature 98.4, HR 99, RR 15, BP 72/54, SPO2 100% on room air.  Hemoglobin WBC count 16.7, hemoglobin 7.4, platelet 487.  Sodium 133, potassium 3.5, chloride 102, CO2 22, BUN 12, creatinine 0.86, glucose 89.  AST 44, ALT 36.  INR 1.2.  Lactic acid 2.4.  Chest x-ray with no acute cardiopulmonary disease process.  Urinalysis, urine culture, blood culture: Pending.  Patient received Flagyl, vancomycin, cefepime, lactated Ringer's x2 with improvement of his blood pressure to 97/69.  ED physician referred for admission for severe sepsis likely 2/2 to chronic anal wound.  Wound cx not warranted as will likely be polymicrobial per general surgery.  On empiric broad spectrum abx.  Due to not adequately responding to IV fluids with MAP in the 50s patient was started on neo-Synephrine on  03/11/2019.  03/12/19: Seen and examined.  No acute events overnight.  His pain is well controlled today.    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 General surgery consulted 03/10/19.  Not a surgical candidate. Patient had previously not been a surgical candidate per medical records review Due to poor response to IV fluid, was started on neo-synephrine to maintain MAP>65.  Wean off pressors as tolerated. Currently on cefepime, IV Flagyl, and IV vancomycin, continue day #4 Continue to closely monitor in stepdown unit  Acute blood loss anemia likely secondary to chronic blood loss from chronic anal wound Hemoglobin dropped to 6.8 on 03/10/2019, Received 1 unit PRBC. New baseline hemoglobin appears to be 8 with low MCV 71. Hemoglobin this morning 8.3>> 8.4  Iron deficiency anemia Per medical review patient has refused IV iron infusion at oncology's office Iron studies suggestive of iron deficiency Continue p.o. iron supplement We will transfuse IV Feraheme  510 mg once if patient is agreeable  Severe protein calorie malnutrition Albumin 2.0 Severe muscle mass loss Continue oral supplement Dietitian consulted, appreciate recommendations.  Failure to thrive in the  setting of metastatic anal cancer Encourage oral intake as tolerated  Chronic anal wound Patient with chronic wound, reports packing changed daily.  It is very necrotic and foul-smelling. --CT abdomen/pelvis as above for further evaluation to ensure no significant abscess formation.  Please review radiology's report. --Wound care consult for evaluation and treatment recommendations --Continue empiric antibiotics as above  Metastatic anal cancer status post diverting colostomy and suprapubic catheter Hx high-grade non-Hodgkin's lymphoma Per previous general surgery notes, he is not a candidate for any other surgical intervention, and currently not a candidate for immunotherapy per oncology secondary to his poor functional status. --Continue outpatient follow-up with oncology and surgery with [facility] --Wound care/ostomy for continued colostomy care while inpatient --Palliative care following for assistance with goals of care, medical decision-making and family support given overall extremely poor prognosis. Patient and family have requested oncology evaluation for second opinion on his prognosis. Discussed with Dr. Jana Hakim on 03/10/19 and discussed with Dr. Burr Medico 1/25. Kindly review oncology's note.  Appreciate oncology and palliative care team assistance.  Goals of care Patient has a very poor prognosis Has been followed by palliative outpatient per medical records reviewed Palliative care team has been consulted to assist with establishing goals of care. Currently full code.   DVT prophylaxis: Lovenox subcu daily Code Status: Full code Family Communication:  None at bedside.  Disposition Plan:  Patient is from home.  Plan is to discharge to home with home health services.  Barriers to discharge septic shock needing IV antibiotics, IV fluids, and pressors to maintain MAP>65.    Consults called: Palliative care, general surgery, oncology.     Objective: Vitals:   03/12/19 1030  03/12/19 1045 03/12/19 1100 03/12/19 1213  BP: 120/66 129/65 (!) 120/56   Pulse: 73 65 86   Resp: 15 16 19    Temp:    98 F (36.7 C)  TempSrc:    Oral  SpO2: 100% 99% 100%   Weight:      Height:        Intake/Output Summary (Last 24 hours) at 03/12/2019 1254 Last data filed at 03/12/2019 1135 Gross per 24 hour  Intake 3334.75 ml  Output 6375 ml  Net -3040.25 ml   Filed Weights   03/09/19 1434 03/11/19 0400 03/12/19 0500  Weight: 62.1 kg 65.7 kg 66 kg    Exam:  . General: 49 y.o. year-old male very frail-appearing in no acute distress.  Alert and oriented x3. . Cardiovascular: Regular rate and rhythm no rubs or gallops.   Marland Kitchen Respiratory: Clear to auscultation no wheezes or rales. . Abdomen: Soft nontender normal bowel sounds present. . Suprapubic catheter in place.  Ostomy in left abdomen. . Musculoskeletal: No lower extremity edema bilaterally. . Skin: Anal wound packed. Psychiatry: Mood is appropriate for condition and setting.   Data Reviewed: CBC: Recent Labs  Lab 03/09/19 1422 03/10/19 0519 03/10/19 2143 03/11/19 0259 03/12/19 0239  WBC 16.7* 13.4* 11.5* 12.5* 9.1  NEUTROABS 13.9*  --   --  9.6* 6.4  HGB 7.4* 6.8* 8.0* 8.3* 8.4*  HCT 24.9* 23.6* 27.3* 27.7* 27.6*  MCV 71.8* 72.8* 76.3* 75.7* 74.2*  PLT 487* 474* 515* 476* [id number]*   Basic Metabolic Panel: Recent Labs  Lab 03/09/19 1422 03/10/19 0519 03/11/19 0259 03/12/19 0239  NA 133* 135 135 135  K 3.5 4.0 4.3 3.8  CL 102 106 104 103  CO2 22 22 24 26   GLUCOSE 89 120* 95 88  BUN 12 9 9 9   CREATININE 0.86 0.61 0.56* 0.53*  CALCIUM 9.2 8.9 8.4* 8.4*  MG  --  1.8 1.7  --   PHOS  --   --  2.8  --    GFR: Estimated Creatinine Clearance: 103.1 mL/min (A) (by C-G formula based on SCr of 0.53 mg/dL (L)). Liver Function Tests: Recent Labs  Lab 03/09/19 1422 03/10/19 0519 03/11/19 0259 03/12/19 0239  AST 44* 21 10* 8*  ALT 36 31 20 16   ALKPHOS 125 99 80 71  BILITOT 0.2* 0.2* 0.1* 0.1*  PROT 7.7  6.8 6.2* 6.5  ALBUMIN 2.3* 2.0* 1.9* 1.9*   No results for input(s): LIPASE, AMYLASE in the last 168 hours. No results for input(s): AMMONIA in the last 168 hours. Coagulation Profile: Recent Labs  Lab 03/09/19 1422  INR 1.2   Cardiac Enzymes: No results for input(s): CKTOTAL, CKMB, CKMBINDEX, TROPONINI in the last 168 hours. BNP (last 3 results) No results for input(s): PROBNP in the last 8760 hours. HbA1C: No results for input(s): HGBA1C in the last 72 hours. CBG: No results for input(s): GLUCAP in the last 168 hours. Lipid Profile: No results for input(s): CHOL, HDL, LDLCALC, TRIG, CHOLHDL, LDLDIRECT in the last 72 hours. Thyroid Function Tests: No results for input(s): TSH, T4TOTAL, FREET4, T3FREE, THYROIDAB in the last 72 hours. Anemia Panel: Recent Labs    03/09/19 2003  VITAMINB12 357  FOLATE 12.0  FERRITIN 33  TIBC 174*  IRON 21*  RETICCTPCT 0.8   Urine analysis:    Component Value Date/Time   COLORURINE YELLOW 03/11/2019 1331   APPEARANCEUR CLEAR 03/11/2019 1331   LABSPEC 1.012 03/11/2019 1331   PHURINE 6.0 03/11/2019 1331   GLUCOSEU NEGATIVE 03/11/2019 1331   HGBUR NEGATIVE 03/11/2019 1331   BILIRUBINUR NEGATIVE 03/11/2019 1331   KETONESUR 5 (A) 03/11/2019 1331   PROTEINUR NEGATIVE 03/11/2019 1331   NITRITE NEGATIVE 03/11/2019 1331   LEUKOCYTESUR MODERATE (A) 03/11/2019 1331   Sepsis Labs: @LABRCNTIP (procalcitonin:4,lacticidven:4)  ) Recent Results (from the past 240 hour(s))  Blood Culture (routine x 2)     Status: None (Preliminary result)   Collection Time: 03/09/19  2:22 PM   Specimen: Right Antecubital; Blood  Result Value Ref Range Status   Specimen Description   Final    RIGHT ANTECUBITAL Performed at [facility], [place] [address], [place], [place] [address]    Special Requests   Final    BOTTLES DRAWN AEROBIC AND ANAEROBIC Blood Culture adequate volume Performed at [place] [address], [place], [place] [address]    Culture   Final    NO GROWTH 3 DAYS Performed at [facility], [place] [address], [place], [place] [address]    Report Status PENDING  Incomplete  Blood Culture (routine x 2)     Status: None (Preliminary result)   Collection Time: 03/09/19  2:23 PM   Specimen: BLOOD LEFT HAND  Result Value Ref Range Status   Specimen Description   Final    BLOOD LEFT HAND Performed at [place] [address], [place], [place] [address]    Special Requests   Final    BOTTLES DRAWN AEROBIC AND ANAEROBIC Blood Culture results may not be optimal due to an inadequate volume of blood received in culture bottles Performed at [facility], [place] [address], [place], [place] [address]    Culture   Final    NO GROWTH 3 DAYS Performed at [facility], [place] [address], [place], [place] [address]    Report Status PENDING  Incomplete  SARS CORONAVIRUS 2 (TAT 6-24 HRS) Nasopharyngeal Nasopharyngeal Swab     Status: None   Collection Time: 03/09/19  6:32 PM   Specimen: Nasopharyngeal Swab  Result Value Ref Range Status   SARS Coronavirus 2 NEGATIVE NEGATIVE Final    Comment: (NOTE) SARS-CoV-2 target nucleic acids are NOT DETECTED. The SARS-CoV-2 RNA is generally detectable in upper and lower respiratory specimens during the acute phase of infection. Negative results do not preclude SARS-CoV-2 infection, do not rule out co-infections with other pathogens, and should not be used as the sole basis for treatment or other patient management decisions. Negative results must be combined with clinical observations, patient history, and epidemiological information. The expected result is Negative. Fact Sheet for Patients: SugarRoll.be Fact Sheet for Healthcare Providers: https://www.woods-mathews.com/ This test is not yet approved or cleared by the [place] FDA and  has been authorized for  detection and/or diagnosis of SARS-CoV-2 by FDA under an Emergency Use Authorization (EUA). This EUA will remain  in effect (meaning this test can be used) for the duration of the COVID-19 declaration under Section 56 4(b)(1) of the Act, 21 U.S.C. section 360bbb-3(b)(1), unless the authorization is terminated or revoked sooner. Performed at [facility], [place] [address], [place], [place] [address]   MRSA PCR Screening     Status: None   Collection Time: 03/09/19  6:50 PM   Specimen: Nasopharyngeal  Result Value Ref Range Status   MRSA by PCR NEGATIVE NEGATIVE Final    Comment:        The GeneXpert MRSA Assay (FDA approved for NASAL specimens only), is one component of a comprehensive MRSA colonization surveillance program. It is not intended to diagnose MRSA infection nor to guide or monitor treatment for MRSA infections. Performed at [facility], [place] [address], [place], [place] [address]   Culture, Urine     Status: Abnormal   Collection Time: 03/11/19  1:31 PM   Specimen: Urine, Catheterized  Result Value Ref Range Status   Specimen Description   Final    URINE, CATHETERIZED Performed at [place] [address] [place], [place] [address]    Special Requests   Final    Immunocompromised Performed at [facility], [place] [address], [place], [place] [address]    Culture (A)  Final    <10,000 COLONIES/mL INSIGNIFICANT GROWTH Performed at [employer] [address] [place], [place] [address]    Report Status 03/12/2019 FINAL  Final      Studies: No results found.  Scheduled Meds: . Chlorhexidine Gluconate Cloth  6 each Topical Daily  . enoxaparin (LOVENOX) injection  40 mg Subcutaneous Q24H  . feeding supplement (ENSURE ENLIVE)  237 mL Oral TID BM  . feeding supplement (PRO-STAT SUGAR FREE 64)  30 mL Oral BID  . ferrous sulfate  325 mg Oral Q breakfast  . mouth rinse  15 mL Mouth Rinse BID   . midodrine  10 mg Oral TID WC  . multivitamin with minerals  1 tablet Oral Daily  . oxyCODONE  20 mg Oral 2 times per day  . oxyCODONE  30 mg Oral Q24H    Continuous Infusions: . sodium chloride    .  sodium chloride 100 mL/hr at 03/12/19 1000  . ceFEPime (MAXIPIME) IV Stopped (03/12/19 0534)  . metronidazole Stopped (03/12/19 [id number])  . phenylephrine (NEO-SYNEPHRINE) Adult infusion 20 mcg/min (03/12/19 1132)  . vancomycin Stopped (03/12/19 0847)     LOS: 3 days     Kayleen Memos, MD [facility] Pager [phone]  If 7PM-7AM, please contact night-coverage [email or web address] Password [email or web address] 03/12/2019, 12:54 PM

## 2019-03-12 NOTE — Progress Notes (Signed)
At 1408 paused pt's Neo to try and wean off. Pt's BP and MAP still maintaining at goal and Dr. [place] Crane notified and aware of continuing to hold Neo at this time.  Will continue to monitor

## 2019-03-13 ENCOUNTER — Ambulatory Visit (HOSPITAL_COMMUNITY): Admission: RE | Admit: 2019-03-13 | Payer: Medicare Other | Source: Ambulatory Visit

## 2019-03-13 LAB — CBC WITH DIFFERENTIAL/PLATELET
Abs Immature Granulocytes: 0.06 10*3/uL (ref 0.00–0.07)
Basophils Absolute: 0 10*3/uL (ref 0.0–0.1)
Basophils Relative: 1 %
Eosinophils Absolute: 0.1 10*3/uL (ref 0.0–0.5)
Eosinophils Relative: 2 %
HCT: 26.3 % — ABNORMAL LOW (ref 39.0–52.0)
Hemoglobin: 7.8 g/dL — ABNORMAL LOW (ref 13.0–17.0)
Immature Granulocytes: 1 %
Lymphocytes Relative: 16 %
Lymphs Abs: 1.4 10*3/uL (ref 0.7–4.0)
MCH: 22 pg — ABNORMAL LOW (ref 26.0–34.0)
MCHC: 29.7 g/dL — ABNORMAL LOW (ref 30.0–36.0)
MCV: 74.3 fL — ABNORMAL LOW (ref 80.0–100.0)
Monocytes Absolute: 0.7 10*3/uL (ref 0.1–1.0)
Monocytes Relative: 8 %
Neutro Abs: 6.2 10*3/uL (ref 1.7–7.7)
Neutrophils Relative %: 72 %
Platelets: 442 10*3/uL — ABNORMAL HIGH (ref 150–400)
RBC: 3.54 MIL/uL — ABNORMAL LOW (ref 4.22–5.81)
RDW: 23 % — ABNORMAL HIGH (ref 11.5–15.5)
WBC: 8.5 10*3/uL (ref 4.0–10.5)
nRBC: 0 % (ref 0.0–0.2)

## 2019-03-13 LAB — COMPREHENSIVE METABOLIC PANEL
ALT: 14 U/L (ref 0–44)
AST: 7 U/L — ABNORMAL LOW (ref 15–41)
Albumin: 2 g/dL — ABNORMAL LOW (ref 3.5–5.0)
Alkaline Phosphatase: 67 U/L (ref 38–126)
Anion gap: 6 (ref 5–15)
BUN: 6 mg/dL (ref 6–20)
CO2: 28 mmol/L (ref 22–32)
Calcium: 8.5 mg/dL — ABNORMAL LOW (ref 8.9–10.3)
Chloride: 101 mmol/L (ref 98–111)
Creatinine, Ser: 0.59 mg/dL — ABNORMAL LOW (ref 0.61–1.24)
GFR calc Af Amer: >60 mL/min (ref >60)
GFR calc non Af Amer: >60 mL/min (ref >60)
Glucose, Bld: 100 mg/dL — ABNORMAL HIGH (ref 70–99)
Potassium: 4 mmol/L (ref 3.5–5.1)
Sodium: 135 mmol/L (ref 135–145)
Total Bilirubin: 0.3 mg/dL (ref 0.3–1.2)
Total Protein: 6.6 g/dL (ref 6.5–8.1)

## 2019-03-13 MED ORDER — PIPERACILLIN-TAZOBACTAM 3.375 G IVPB
3.3750 g | Freq: Three times a day (TID) | INTRAVENOUS | Status: DC
  Administered 2019-03-13 – 2019-03-14 (×3): 3.375 g via INTRAVENOUS
  Filled 2019-03-13 (×5): qty 50

## 2019-03-13 NOTE — Progress Notes (Signed)
PROGRESS NOTE    Dylan [name]  [id number] DOB: [birth year] DOA: 03/09/2019 PCP: [name], MD   Brief Narrative: 49 year old male with history of metastatic anal cancer status post radiation with diverting colostomy/suprapubic catheter, chronic anal wound presented to ER with dizziness, decreased appetite for past 2 days, increased purulent drainage from his and around.  Recent admission from 12 21-12/26 for similar episodes related to severe sepsis/Shock thought to be 2/2 and L1 versus UTI due to possible fistula formation- was on abx, pressors, was seen by surgery and deemed not a surgical candidate for any kind of intervention recommended follow-up with the surgeons at [facility] and was discharged on midodrine. In ER chest x-ray no acute finding, CT abdomen pelvis showed pulmonary metastasis, metastatic lymphadenopathy, local osseous metastasis in the pubic ramus. He was started on broad-spectrum antibiotics aggressive IV fluid resuscitation and was admitted for severe sepsis.  Subsequently patient needing pressors 1/25 and stopped 1/26 3 pm. Leukocytosis has resolved, blood culture no growth so far, urine culture with <10,00 colonies.  Subjective:  Pain on back side, is chronic- not needing meds this am. Having bm on colostomy. No new complaints Overnight no acute events, afebrile, blood pressure 110s-140s Labs shows WBC 8.5K hemoglobin 7.8 gm, albumin 2.0 Lives w daughter who helps him  Assessment & Plan:  Septic shock due to worsening chronic and around POA: Status post aggressive IV fluid resuscitation and also needing pressors-weaned off 1/26.  Blood culture no growth so far, urine culture insignificant growth.  Seen by general surgery 1/24 not a surgical candidate.  On broad-spectrum antibiotics with cefepime Flagyl and vancomycin day #5.  Will narrow down to Zosyn hopefully switch to Augmentin upon discharge.  Acute on chronic anemia likely from acute blood loss/chronic  blood loss from chronic anal wound/iron deficiency anemia- s/p 1 unit PRBC on 1/24.  Baseline hemoglobin around 8.  On iron supplementation.  Hb at 7.8.  On iron supplementation previously refused IV iron infusion at oncology office.  Will need close follow-up with PCP/oncology Recent Labs  Lab 03/10/19 0519 03/10/19 2143 03/11/19 0259 03/12/19 0239 03/13/19 0159  HGB 6.8* 8.0* 8.3* 8.4* 7.8*  HCT 23.6* 27.3* 27.7* 27.6* 26.3*   Chronic anal wound, necrotic and foul-smelling CT abdomen no significant abscess no acute finding.  Wound care, supportive care  Renal cancer status post diverting colostomy and suprapubic catheter/high-grade non-Hodgkin's lymphoma: Per previous surgical note not a candidate for any surgical intervention, not a candidate for immunotherapy per oncology due to poor performance status.  Family request oncology was reconsulted Dr Nevada Crane d/w Dr Jana Hakim 1/24  and Adella Nissen 1/25 - see note.  Failure to thrive in the setting of metastatic anal cancer, debility/conditioning/Goals of care: Patient with very poor prognosis, followed by palliative care.  Nutrition: Ensure 3 times daily, prostat twice daily  Nutrition Problem: Moderate Malnutrition Etiology: chronic illness, cancer and cancer related treatments Signs/Symptoms: mild muscle depletion, moderate fat depletion, moderate muscle depletion Interventions: MVI, Ensure Enlive (each supplement provides 350kcal and 20 grams of protein), Prostat  Pressure Ulcer: Pressure Injury 03/09/19 Groin Right Stage 2 -  Partial thickness loss of dermis presenting as a shallow open injury with a red, pink wound bed without slough. (Active)  03/09/19 1855  Location: Groin  Location Orientation: Right  Staging: Stage 2 -  Partial thickness loss of dermis presenting as a shallow open injury with a red, pink wound bed without slough.  Wound Description (Comments):   Present on Admission: Yes  Body mass index is 18.5 kg/m.    DVT  prophylaxis: lovenox Code Status: Full code Family Communication: plan of care discussed with patient at bedside. Disposition Plan: Remains inpatient for IV antibiotics, hopefully home soon in 1 to 2 days on oral antibiotics.   Consultants: Palliative care, general surgery, oncology Procedures:see note CT abdomen pelvis with contrast 1/23 1. No acute findings within the abdomen or pelvis. No appreciable change compared to the earlier CT abdomen of 02/04/2019. 2. The majority of the previously described pulmonary nodules at the lung bases have increased in size, compatible with pulmonary metastases. 3. No significant change in the sizes of the 2 previously described masses at the inferior and superior aspects of a large cavity in the perineal region, compatible with patient's previously described known anal malignancy. 4. Mildly prominent lymph nodes within the periaortic retroperitoneum and along the proximal iliac chain regions, compatible with previously described metastatic lymphadenopathy. 5. Erosive changes along the inferior and posterior margins of the LEFT inferior pubic ramus, possibly post treatment change, suspicious for local osseous metastasis. Stable lesions within the L3 vertebral body RIGHT iliac bone, again suggestive of treated osseous metastases. No new osseous finding. 6. Stable circumferential thickening of the walls of the bladder, again compatible with marked cystitis. Suprapubic Foley catheter remains in place.  Microbiology: Antimicrobials: Anti-infectives (From admission, onward)   Start     Dose/Rate Route Frequency Ordered Stop   03/10/19 0800  vancomycin (VANCOCIN) IVPB 1000 mg/200 mL premix     1,000 mg 200 mL/hr over 60 Minutes Intravenous Every 12 hours 03/09/19 1724     03/09/19 2200  ceFEPIme (MAXIPIME) 2 g in sodium chloride 0.9 % 100 mL IVPB     2 g 200 mL/hr over 30 Minutes Intravenous Every 8 hours 03/09/19 1626     03/09/19 2200  metroNIDAZOLE (FLAGYL)  IVPB 500 mg     500 mg 100 mL/hr over 60 Minutes Intravenous Every 8 hours 03/09/19 1727     03/09/19 1730  metroNIDAZOLE (FLAGYL) IVPB 500 mg  Status:  Discontinued     500 mg 100 mL/hr over 60 Minutes Intravenous Every 8 hours 03/09/19 1726 03/09/19 1727   03/09/19 1430  vancomycin (VANCOREADY) IVPB 1250 mg/250 mL     1,250 mg 166.7 mL/hr over 90 Minutes Intravenous  Once 03/09/19 1359 03/09/19 1908   03/09/19 1400  ceFEPIme (MAXIPIME) 2 g in sodium chloride 0.9 % 100 mL IVPB     2 g 200 mL/hr over 30 Minutes Intravenous  Once 03/09/19 1357 03/09/19 1558   03/09/19 1400  metroNIDAZOLE (FLAGYL) IVPB 500 mg     500 mg 100 mL/hr over 60 Minutes Intravenous  Once 03/09/19 1357 03/09/19 1734   03/09/19 1400  vancomycin (VANCOCIN) IVPB 1000 mg/200 mL premix  Status:  Discontinued     1,000 mg 200 mL/hr over 60 Minutes Intravenous  Once 03/09/19 1357 03/09/19 1359     Medications: Scheduled Meds: . Chlorhexidine Gluconate Cloth  6 each Topical Daily  . enoxaparin (LOVENOX) injection  40 mg Subcutaneous Q24H  . feeding supplement (ENSURE ENLIVE)  237 mL Oral TID BM  . feeding supplement (PRO-STAT SUGAR FREE 64)  30 mL Oral BID  . ferrous sulfate  325 mg Oral Q breakfast  . mouth rinse  15 mL Mouth Rinse BID  . midodrine  10 mg Oral TID WC  . multivitamin with minerals  1 tablet Oral Daily  . oxyCODONE  20 mg Oral 2 times per day  .  oxyCODONE  30 mg Oral Q24H   Continuous Infusions: . sodium chloride    . sodium chloride 100 mL/hr at 03/13/19 0645  . ceFEPime (MAXIPIME) IV Stopped (03/13/19 0556)  . metronidazole 100 mL/hr at 03/13/19 0645  . phenylephrine (NEO-SYNEPHRINE) Adult infusion Stopped (03/12/19 1316)  . vancomycin Stopped (03/12/19 2039)    Objective: Vitals:   03/13/19 0300 03/13/19 0400 03/13/19 0500 03/13/19 0600  BP: (!) 125/52 (!) 114/51 (!) 117/56 118/61  Pulse: 77 73 79 91  Resp: 13 12 14 19   Temp:  98 F (36.7 C)    TempSrc:  Oral    SpO2: 97% 99% 98%  100%  Weight:   63.6 kg   Height:        Intake/Output Summary (Last 24 hours) at 03/13/2019 0737 Last data filed at 03/13/2019 0645 Gross per 24 hour  Intake 5105.71 ml  Output 4825 ml  Net 280.71 ml   Filed Weights   03/11/19 0400 03/12/19 0500 03/13/19 0500  Weight: 65.7 kg 66 kg 63.6 kg   Weight change: -2.4 kg  Body mass index is 18.5 kg/m.  Intake/Output from previous day: 01/26 0701 - 01/27 0700 In: 5105.7 [P.O.:1020; I.V.:3223; IV Piggyback:862.7] Out: [id number]; Stool:150] Intake/Output this shift: No intake/output data recorded.  Examination:  General exam: AAOx3,NAD, cachectic, thin HEENT:Oral mucosa moist, Ear/Nose WNL grossly, dentition normal. Respiratory system: Diminished at the base,no wheezing or crackles,no use of accessory muscle Cardiovascular system: S1 & S2 +, No JVD,. Gastrointestinal system: Abdomen soft, NT,ND, BS+ Nervous System:Alert, awake, moving extremities and grossly nonfocal Extremities: No edema, distal peripheral pulses palpable.  Skin: No rashes,no icterus. MSK: Normal muscle bulk,tone, power Multiple wounds on back Suprapubic cath+, COLOSTOMY + with stool  Data Reviewed: I have personally reviewed following labs and imaging studies  CBC: Recent Labs  Lab 03/09/19 1422 03/09/19 1422 03/10/19 0519 03/10/19 2143 03/11/19 0259 03/12/19 0239 03/13/19 0159  WBC 16.7*   < > 13.4* 11.5* 12.5* 9.1 8.5  NEUTROABS 13.9*  --   --   --  9.6* 6.4 6.2  HGB 7.4*   < > 6.8* 8.0* 8.3* 8.4* 7.8*  HCT 24.9*   < > 23.6* 27.3* 27.7* 27.6* 26.3*  MCV 71.8*   < > 72.8* 76.3* 75.7* 74.2* 74.3*  PLT 487*   < > 474* 515* 476* 511* 442*   < > = values in this interval not displayed.   Basic Metabolic Panel: Recent Labs  Lab 03/09/19 1422 03/10/19 0519 03/11/19 0259 03/12/19 0239 03/13/19 0159  NA 133* 135 135 135 135  K 3.5 4.0 4.3 3.8 4.0  CL 102 106 104 103 101  CO2 22 22 24 26 28   GLUCOSE 89 120* 95 88 100*  BUN 12 9 9 9 6     CREATININE 0.86 0.61 0.56* 0.53* 0.59*  CALCIUM 9.2 8.9 8.4* 8.4* 8.5*  MG  --  1.8 1.7  --   --   PHOS  --   --  2.8  --   --    GFR: Estimated Creatinine Clearance: 99.4 mL/min (A) (by C-G formula based on SCr of 0.59 mg/dL (L)). Liver Function Tests: Recent Labs  Lab 03/09/19 1422 03/10/19 0519 03/11/19 0259 03/12/19 0239 03/13/19 0159  AST 44* 21 10* 8* 7*  ALT 36 31 20 16 14   ALKPHOS 125 99 80 71 67  BILITOT 0.2* 0.2* 0.1* 0.1* 0.3  PROT 7.7 6.8 6.2* 6.5 6.6  ALBUMIN 2.3* 2.0* 1.9* 1.9* 2.0*  No results for input(s): LIPASE, AMYLASE in the last 168 hours. No results for input(s): AMMONIA in the last 168 hours. Coagulation Profile: Recent Labs  Lab 03/09/19 1422  INR 1.2   Cardiac Enzymes: No results for input(s): CKTOTAL, CKMB, CKMBINDEX, TROPONINI in the last 168 hours. BNP (last 3 results) No results for input(s): PROBNP in the last 8760 hours. HbA1C: No results for input(s): HGBA1C in the last 72 hours. CBG: No results for input(s): GLUCAP in the last 168 hours. Lipid Profile: No results for input(s): CHOL, HDL, LDLCALC, TRIG, CHOLHDL, LDLDIRECT in the last 72 hours. Thyroid Function Tests: No results for input(s): TSH, T4TOTAL, FREET4, T3FREE, THYROIDAB in the last 72 hours. Anemia Panel: No results for input(s): VITAMINB12, FOLATE, FERRITIN, TIBC, IRON, RETICCTPCT in the last 72 hours. Sepsis Labs: Recent Labs  Lab 03/09/19 1422 03/09/19 1611 03/10/19 0207 03/10/19 0519  LATICACIDVEN 2.4* 1.7 1.4 2.2*    Recent Results (from the past 240 hour(s))  Blood Culture (routine x 2)     Status: None (Preliminary result)   Collection Time: 03/09/19  2:22 PM   Specimen: Right Antecubital; Blood  Result Value Ref Range Status   Specimen Description   Final    RIGHT ANTECUBITAL Performed at [facility], [place] [address], [place], [place] [address]    Special Requests   Final    BOTTLES DRAWN AEROBIC AND ANAEROBIC Blood Culture  adequate volume Performed at [place] [address] [place], [place] [address]    Culture   Final    NO GROWTH 3 DAYS Performed at [facility], [place] [address] [place], [place] [address]    Report Status PENDING  Incomplete  Blood Culture (routine x 2)     Status: None (Preliminary result)   Collection Time: 03/09/19  2:23 PM   Specimen: BLOOD LEFT HAND  Result Value Ref Range Status   Specimen Description   Final    BLOOD LEFT HAND Performed at [place] [address], [place], [place] [address]    Special Requests   Final    BOTTLES DRAWN AEROBIC AND ANAEROBIC Blood Culture results may not be optimal due to an inadequate volume of blood received in culture bottles Performed at [place] [address] [place], [place] [address]    Culture   Final    NO GROWTH 3 DAYS Performed at [facility], [place] [address] [place], [place] [address]    Report Status PENDING  Incomplete  SARS CORONAVIRUS 2 (TAT 6-24 HRS) Nasopharyngeal Nasopharyngeal Swab     Status: None   Collection Time: 03/09/19  6:32 PM   Specimen: Nasopharyngeal Swab  Result Value Ref Range Status   SARS Coronavirus 2 NEGATIVE NEGATIVE Final    Comment: (NOTE) SARS-CoV-2 target nucleic acids are NOT DETECTED. The SARS-CoV-2 RNA is generally detectable in upper and lower respiratory specimens during the acute phase of infection. Negative results do not preclude SARS-CoV-2 infection, do not rule out co-infections with other pathogens, and should not be used as the sole basis for treatment or other patient management decisions. Negative results must be combined with clinical observations, patient history, and epidemiological information. The expected result is Negative. Fact Sheet for Patients: SugarRoll.be Fact Sheet for Healthcare Providers: https://www.woods-mathews.com/ This test is not  yet approved or cleared by the [place] FDA and  has been authorized for detection and/or diagnosis of SARS-CoV-2 by FDA under an Emergency Use  Authorization (EUA). This EUA will remain  in effect (meaning this test can be used) for the duration of the COVID-19 declaration under Section 56 4(b)(1) of the Act, 21 U.S.C. section 360bbb-3(b)(1), unless the authorization is terminated or revoked sooner. Performed at [facility], [place] [address] [place], [place] [address]   MRSA PCR Screening     Status: None   Collection Time: 03/09/19  6:50 PM   Specimen: Nasopharyngeal  Result Value Ref Range Status   MRSA by PCR NEGATIVE NEGATIVE Final    Comment:        The GeneXpert MRSA Assay (FDA approved for NASAL specimens only), is one component of a comprehensive MRSA colonization surveillance program. It is not intended to diagnose MRSA infection nor to guide or monitor treatment for MRSA infections. Performed at [facility], [place] [address], [place], [place] [address]   Culture, Urine     Status: Abnormal   Collection Time: 03/11/19  1:31 PM   Specimen: Urine, Catheterized  Result Value Ref Range Status   Specimen Description   Final    URINE, CATHETERIZED Performed at [place] [address], [place], [place] [address]    Special Requests   Final    Immunocompromised Performed at [facility], [place] [address], [place], [place] [address]    Culture (A)  Final    <10,000 COLONIES/mL INSIGNIFICANT GROWTH Performed at [place] [address], [place], [place] [address]    Report Status 03/12/2019 FINAL  Final     Radiology Studies: No results found.    LOS: 4 days   Time spent: More than 50% of that time was spent in counseling and/or coordination of care.  Antonieta Pert, MD [facility]  03/13/2019, 7:37 AM

## 2019-03-13 NOTE — Progress Notes (Signed)
Physical Therapy Treatment Patient Details Name: Dylan [name] MRN: [id number] DOB: Jul 13, 1970 Today's Date: 03/13/2019    History of Present Illness Pt is a 49 y.o. male with medical history significant of metastatic anal cancer status post radiation with diverting colostomy and suprapubic catheter placement; chronic anal wound who presents to the ED with dizziness/lightheadedness and associated decreased appetite over the past 2 days. CT suggestive of osseous metastases to pelvis and L3 vertebral body. Pt admitted with severe sepsis    PT Comments    Progressing with mobility.    Follow Up Recommendations  Home health PT     Equipment Recommendations  None recommended by PT    Recommendations for Other Services       Precautions / Restrictions Precautions Precautions: Fall Precaution Comments: anal wound (likes mesh briefs with washcloths) Restrictions Weight Bearing Restrictions: No    Mobility  Bed Mobility   Bed Mobility: Sidelying to Sit;Sit to Sidelying   Sidelying to sit: Supervision     Sit to sidelying: Supervision General bed mobility comments: for lines.  Transfers Overall transfer level: Needs assistance Equipment used: None Transfers: Sit to/from Stand Sit to Stand: Min guard         General transfer comment: for safety  Ambulation/Gait Ambulation/Gait assistance: Min guard Gait Distance (Feet): 250 Feet Assistive device: Rolling walker (2 wheeled) Gait Pattern/deviations: Trunk flexed;Step-through pattern     General Gait Details: for safety. pt tolerated distance well.   Stairs             Wheelchair Mobility    Modified [facility] (Stroke Patients Only)       Balance                                            Cognition Arousal/Alertness: Awake/alert Behavior During Therapy: Flat affect Overall Cognitive Status: Within Functional Limits for tasks assessed                                         Exercises      General Comments        Pertinent Vitals/Pain Pain Assessment: Faces Faces Pain Scale: Hurts a little bit Pain Location: buttocks/anus Pain Descriptors / Indicators: Discomfort;Sore Pain Intervention(s): Monitored during session    Home Living                      Prior Function            PT Goals (current goals can now be found in the care plan section) Progress towards PT goals: Progressing toward goals    Frequency    Min 3X/week      PT Plan Current plan remains appropriate    Co-evaluation              AM-PAC PT "6 Clicks" Mobility   Outcome Measure  Help needed turning from your back to your side while in a flat bed without using bedrails?: None Help needed moving from lying on your back to sitting on the side of a flat bed without using bedrails?: None Help needed moving to and from a bed to a chair (including a wheelchair)?: None Help needed standing up from a chair using your arms (e.g., wheelchair or bedside chair)?: None Help needed  to walk in hospital room?: A Little Help needed climbing 3-5 steps with a railing? : A Little 6 Click Score: 22    End of Session   Activity Tolerance: Patient tolerated treatment well Patient left: in bed;with call bell/phone within reach   PT Visit Diagnosis: Other abnormalities of gait and mobility (R26.89)     Time: [id number] PT Time Calculation (min) (ACUTE ONLY): 15 min  Charges:  $Gait Training: 8-22 mins                        Doreatha Massed, PT Acute Rehabilitation

## 2019-03-13 NOTE — Progress Notes (Signed)
Pharmacy Antibiotic Note  Dylan [name] is a 49 y.o. male admitted on 03/09/2019 with severe sepsis/septic shock secondary to anal wound vs. UTI.  Pharmacy has been consulted for Vancomycin and Cefepime dosing. Patient also placed on Metronidazole per MD.   Today, 03/13/19  Day 4 Vanc/Cefepime/Flagyl  WBC WNL stable  SCr stable  Blood and urine cx's negative to date  Plan:  Continue current vancomycin 1g IV q12 for now   Continue current cefepime dosing for now  Flagyl per Md  With patient's labs WNL and being afebrile, in addition to no plans for surgical intervention on patient's wound, would recommend narrowing abx's from current regiment to Zosyn alone for time being  Height: 6\' 1"  (185.4 cm) Weight: 140 lb 3.4 oz (63.6 kg) IBW/kg (Calculated) : 79.9  Temp (24hrs), Avg:97.9 F (36.6 C), Min:97.7 F (36.5 C), Max:98 F (36.7 C)  Recent Labs  Lab 03/09/19 1422 03/09/19 1422 03/09/19 1611 03/10/19 0207 03/10/19 0519 03/10/19 2143 03/11/19 0259 03/12/19 0239 03/13/19 0159  WBC 16.7*   < >  --   --  13.4* 11.5* 12.5* 9.1 8.5  CREATININE 0.86  --   --   --  0.61  --  0.56* 0.53* 0.59*  LATICACIDVEN 2.4*  --  1.7 1.4 2.2*  --   --   --   --    < > = values in this interval not displayed.    Estimated Creatinine Clearance: 99.4 mL/min (A) (by C-G formula based on SCr of 0.59 mg/dL (L)).    No Known Allergies  Antimicrobials this admission: 1/23 Vancomycin >> 1/23 Cefepime >> 1/23 Metronidazole >>  Dose adjustments this admission: --  Microbiology results: 1/23 BCx: ngtd 1/23 UCx: insig growth  Thank you for allowing pharmacy to be a part of this patient's care.   Kara Mead 03/13/2019 9:10 AM

## 2019-03-13 NOTE — Progress Notes (Signed)
PT Cancellation Note  Patient Details Name: Dylan [name] MRN: [id number] DOB: 03-29-1970   Cancelled Treatment:    Reason Eval/Treat Not Completed: Pt declined to participate at this time. Will check back as schedule allows.   Doreatha Massed, PT Acute Rehabilitation

## 2019-03-14 DIAGNOSIS — Z933 Colostomy status: Secondary | ICD-10-CM

## 2019-03-14 DIAGNOSIS — E440 Moderate protein-calorie malnutrition: Secondary | ICD-10-CM

## 2019-03-14 DIAGNOSIS — I959 Hypotension, unspecified: Secondary | ICD-10-CM

## 2019-03-14 DIAGNOSIS — C210 Malignant neoplasm of anus, unspecified: Secondary | ICD-10-CM

## 2019-03-14 LAB — COMPREHENSIVE METABOLIC PANEL
ALT: 13 U/L (ref 0–44)
AST: 12 U/L — ABNORMAL LOW (ref 15–41)
Albumin: 2.3 g/dL — ABNORMAL LOW (ref 3.5–5.0)
Alkaline Phosphatase: 65 U/L (ref 38–126)
Anion gap: 6 (ref 5–15)
BUN: 9 mg/dL (ref 6–20)
CO2: 29 mmol/L (ref 22–32)
Calcium: 8.9 mg/dL (ref 8.9–10.3)
Chloride: 99 mmol/L (ref 98–111)
Creatinine, Ser: 0.65 mg/dL (ref 0.61–1.24)
GFR calc Af Amer: >60 mL/min (ref >60)
GFR calc non Af Amer: >60 mL/min (ref >60)
Glucose, Bld: 130 mg/dL — ABNORMAL HIGH (ref 70–99)
Potassium: 3.8 mmol/L (ref 3.5–5.1)
Sodium: 134 mmol/L — ABNORMAL LOW (ref 135–145)
Total Bilirubin: 0.4 mg/dL (ref 0.3–1.2)
Total Protein: 7.2 g/dL (ref 6.5–8.1)

## 2019-03-14 LAB — CBC WITH DIFFERENTIAL/PLATELET
Abs Immature Granulocytes: 0.04 10*3/uL (ref 0.00–0.07)
Basophils Absolute: 0 10*3/uL (ref 0.0–0.1)
Basophils Relative: 0 %
Eosinophils Absolute: 0.2 10*3/uL (ref 0.0–0.5)
Eosinophils Relative: 2 %
HCT: 29.5 % — ABNORMAL LOW (ref 39.0–52.0)
Hemoglobin: 9 g/dL — ABNORMAL LOW (ref 13.0–17.0)
Immature Granulocytes: 0 %
Lymphocytes Relative: 18 %
Lymphs Abs: 1.8 10*3/uL (ref 0.7–4.0)
MCH: 22.6 pg — ABNORMAL LOW (ref 26.0–34.0)
MCHC: 30.5 g/dL (ref 30.0–36.0)
MCV: 74.1 fL — ABNORMAL LOW (ref 80.0–100.0)
Monocytes Absolute: 0.5 10*3/uL (ref 0.1–1.0)
Monocytes Relative: 5 %
Neutro Abs: 7.1 10*3/uL (ref 1.7–7.7)
Neutrophils Relative %: 75 %
Platelets: 518 10*3/uL — ABNORMAL HIGH (ref 150–400)
RBC: 3.98 MIL/uL — ABNORMAL LOW (ref 4.22–5.81)
RDW: 23.7 % — ABNORMAL HIGH (ref 11.5–15.5)
WBC: 9.5 10*3/uL (ref 4.0–10.5)
nRBC: 0 % (ref 0.0–0.2)

## 2019-03-14 LAB — CULTURE, BLOOD (ROUTINE X 2)
Culture: NO GROWTH
Culture: NO GROWTH
Special Requests: ADEQUATE

## 2019-03-14 MED ORDER — AMOXICILLIN-POT CLAVULANATE 875-125 MG PO TABS: 1.0000 | ORAL_TABLET | Freq: Two times a day (BID) | ORAL | 0 refills | Status: AC

## 2019-03-14 MED ORDER — MIDODRINE HCL 10 MG PO TABS: 10.0000 mg | ORAL_TABLET | Freq: Three times a day (TID) | ORAL | 0 refills | Status: AC

## 2019-03-14 MED ORDER — PRO-STAT SUGAR FREE PO LIQD: 30.0000 mL | Freq: Two times a day (BID) | ORAL | 0 refills | Status: AC

## 2019-03-14 MED ORDER — ENSURE ENLIVE PO LIQD: 237.0000 mL | Freq: Two times a day (BID) | ORAL | 0 refills | Status: AC

## 2019-03-14 NOTE — Discharge Summary (Signed)
Physician Discharge Summary  Dylan [name] [id number] DOB: 20-May-1970 DOA: 03/09/2019  PCP: [name], MD  Admit date: 03/09/2019 Discharge date: 03/14/2019  Admitted From: home Disposition:  Home w home health  Recommendations for Outpatient Follow-up:  1. Follow up with PCP in 1-2 weeks 2. Please obtain BMP/CBC in one week 3. Please follow up on the following pending results:  Home Health:yes  Equipment/Devices: none  Discharge Condition: Stable Code Status: full Diet recommendation: Heart Healthy,  Brief/Interim Summary:  49 year old male with history of metastatic anal cancer status post radiation with diverting colostomy/suprapubic catheter, chronic anal wound presented to ER with dizziness, decreased appetite for past 2 days, increased purulent drainage from his and around.  Recent admission from 12 21-12/26 for similar episodes related to severe sepsis/Shock thought to be 2/2 and L1 versus UTI due to possible fistula formation- was on abx, pressors, was seen by surgery and deemed not a surgical candidate for any kind of intervention recommended follow-up with the surgeons at [facility] and was discharged on midodrine. In ER chest x-ray no acute finding, CT abdomen pelvis showed pulmonary metastasis, metastatic lymphadenopathy, local osseous metastasis in the pubic ramus. He was started on broad-spectrum antibiotics aggressive IV fluid resuscitation and was admitted for severe sepsis.  Subsequently patient needing pressors 1/25 and stopped 1/26, 3 pm. Leukocytosis has resolved, blood culture no growth so far, urine culture with <10,00 colonies. Patient was managed with IV antibiotics seen by oncology, wound care. Patient has now clinically improved antibiotic has been deescalated and being switched to Augmentin.  He will follow up with home health and continue wound care at home.He feels well and wants to go home today. He is advised to continue midodrine at increased dose 10  mg 3 times daily and continue Augmentin for 1 week.  Up with PCP/wound care.  Of note patient is high risk for readmission given his metastatic disease, chronic wound and noncompliance.    Discharge Diagnoses:  Assessment & Plan:  Septic shock due to worsening chronic and around POA: Status post aggressive IV fluid resuscitation and also needing pressors-weaned off 1/26.  Blood culture no growth so far, urine culture insignificant growth.  Seen by general surgery 1/24 not a surgical candidate.  On broad-spectrum antibiotics with cefepime Flagyl and vancomycin day #5.  Switched to Zosyn 1/27-at this time appears to stable he will follow up with wound care as outpatient.  We will discharge him on Augmentin for few days.  We will continue midodrine 10 mg 3 times daily.  Acute on chronic anemia likely from acute blood loss/chronic blood loss from chronic anal wound/iron deficiency anemia-s/p 1 unit PRBC on 1/24.  Baseline hemoglobin around 8.    He will continue  iron supplementation. previously refused IV iron infusion at oncology office.  Will need close follow-up with PCP/oncology-check CBC in 1 week. Recent Labs  Lab 03/10/19 2143 03/11/19 0259 03/12/19 0239 03/13/19 0159 03/14/19 0441  HGB 8.0* 8.3* 8.4* 7.8* 9.0*  HCT 27.3* 27.7* 27.6* 26.3* 29.5*   Chronic anal wound:Continue wound care at home.  He has been evaluated by surgery and also at [facility] not a candidate for further intervention-advised to continue wound care.  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  Renal cancer status post diverting colostomy and suprapubic catheter/high-grade non-Hodgkin's lymphoma: Per previous surgical note not a candidate for any surgical intervention, not a candidate for immunotherapy per oncology due to poor performance status. As per Family request oncology was reconsulted Dr Nevada Crane d/w Dr Jana Hakim  1/24  and Adella Nissen 1/25 - see note.  Patient reports will follow up with oncology as outpatient.  Failure to thrive in the  setting of metastatic anal cancer, debility/conditioning/Goals of care: Patient with very poor prognosis, followed by palliative care.  Nutrition: Ensure 3 times daily, prostat twice daily  Nutrition Problem: Moderate Malnutrition Etiology: chronic illness, cancer and cancer related treatments Signs/Symptoms: mild muscle depletion, moderate fat depletion, moderate muscle depletion Interventions: MVI, Ensure Enlive (each supplement provides 350kcal and 20 grams of protein), Prostat  Pressure Ulcer: Pressure Injury 03/09/19 Groin Right Stage 2 -  Partial thickness loss of dermis presenting as a shallow open injury with a red, pink wound bed without slough. (Active)  03/09/19 1855  Location: Groin  Location Orientation: Right  Staging: Stage 2 -  Partial thickness loss of dermis presenting as a shallow open injury with a red, pink wound bed without slough.  Wound Description (Comments):   Present on Admission: Yes    Body mass index is 18.5 kg/m.    DVT prophylaxis: lovenox Code Status: Full code Family Communication: plan of care discussed with patient at bedside. Disposition Plan: Remains inpatient for IV antibiotics, hopefully home soon in 1 to 2 days on oral antibiotics.   Consultants: Palliative care, general surgery, oncology Procedures:see note CT abdomen pelvis with contrast 1/23 1. No acute findings within the abdomen or pelvis. No appreciable change compared to the earlier CT abdomen of 02/04/2019. 2. The majority of the previously described pulmonary nodules at the lung bases have increased in size, compatible with pulmonary metastases. 3. No significant change in the sizes of the 2 previously described masses at the inferior and superior aspects of a large cavity in the perineal region, compatible with patient's previously described known anal malignancy. 4. Mildly prominent lymph nodes within the periaortic retroperitoneum and along the proximal iliac chain  regions, compatible with previously described metastatic lymphadenopathy. 5. Erosive changes along the inferior and posterior margins of the LEFT inferior pubic ramus, possibly post treatment change, suspicious for local osseous metastasis. Stable lesions within the L3 vertebral body RIGHT iliac bone, again suggestive of treated osseous metastases. No new osseous finding. 6. Stable circumferential thickening of the walls of the bladder, again compatible with marked cystitis. Suprapubic Foley catheter remains in place.  Subjective: Resting comfortably, pleasant and in good mood."  I need to go home today".  Afebrile doing well on room air, pain control.  Reports he has a nurse coming to his house to change the dressing twice a week  Discharge Exam: Vitals:   03/13/19 2150 03/14/19 0551  BP: 116/63 110/70  Pulse: 86 100  Resp:  17  Temp:  98.4 F (36.9 C)  SpO2:  100%   General: Pt is alert, awake, not in acute distress Cardiovascular: RRR, S1/S2 +, no rubs, no gallops Respiratory: CTA bilaterally, no wheezing, no rhonchi Abdominal: Soft, NT, ND, bowel sounds + Extremities: no edema, no cyanosis wound on the rectum.  Discharge Instructions  Discharge Instructions    Diet - low sodium heart healthy   Complete by: As directed    Increase activity slowly   Complete by: As directed      Allergies as of 03/14/2019   No Known Allergies     Medication List    TAKE these medications   acetaminophen 325 MG tablet Commonly known as: TYLENOL Take 650 mg by mouth every 6 (six) hours as needed for pain.   amoxicillin-clavulanate 875-125 MG tablet Commonly known  as: Augmentin Take 1 tablet by mouth 2 (two) times daily for 7 days.   enoxaparin 40 MG/0.4ML injection Commonly known as: LOVENOX Inject 40 mg into the skin daily.   feeding supplement (ENSURE ENLIVE) Liqd Take 237 mLs by mouth 2 (two) times daily between meals.   feeding supplement (PRO-STAT SUGAR FREE 64) Liqd Take  30 mLs by mouth 2 (two) times daily.   ferrous sulfate 325 (65 FE) MG tablet Take 1 tablet (325 mg total) by mouth daily with breakfast.   MELATONIN PO Take 1 capsule by mouth at bedtime as needed (sleep).   metroNIDAZOLE 0.75 % gel Commonly known as: METROGEL Apply 1 application topically daily as needed (rosacea).   midodrine 10 MG tablet Commonly known as: PROAMATINE Take 1 tablet (10 mg total) by mouth 3 (three) times daily with meals. What changed:   medication strength  how much to take   Oxycodone HCl 20 MG Tabs Take 20-30 mg by mouth See admin instructions. Take 1 tablet in the morning, take 1.5 tablets at lunch, and take 1 tablet at night      Follow-up Information    [name], MD Follow up in 1 week(s).   Specialties: Family Medicine, Sports Medicine Contact information: [place] [id number]        Orson Slick, MD Follow up.   Specialty: Hematology and Oncology Contact information: [place] [address] [phone]          No Known Allergies  The results of significant diagnostics from this hospitalization (including imaging, microbiology, ancillary and laboratory) are listed below for reference.    Microbiology: Recent Results (from the past 240 hour(s))  Blood Culture (routine x 2)     Status: None   Collection Time: 03/09/19  2:22 PM   Specimen: BLOOD  Result Value Ref Range Status   Specimen Description   Final    BLOOD RIGHT ANTECUBITAL Performed at [facility], [address] [place], [place] [address]    Special Requests   Final    BOTTLES DRAWN AEROBIC AND ANAEROBIC Blood Culture adequate volume Performed at [facility] [address], [place], [place] [address]    Culture   Final    NO GROWTH 5 DAYS Performed at [facility], [place] [address], [place], [place] [address]    Report Status 03/14/2019 FINAL  Final  Blood Culture  (routine x 2)     Status: None   Collection Time: 03/09/19  2:23 PM   Specimen: BLOOD LEFT HAND  Result Value Ref Range Status   Specimen Description   Final    BLOOD LEFT HAND Performed at [facility] [address] [place], [place] [address]    Special Requests   Final    BOTTLES DRAWN AEROBIC AND ANAEROBIC Blood Culture results may not be optimal due to an inadequate volume of blood received in culture bottles Performed at [facility] [address] [place], [place] [address]    Culture   Final    NO GROWTH 5 DAYS Performed at [facility], [place] [address] [place], [place] [address]    Report Status 03/14/2019 FINAL  Final  SARS CORONAVIRUS 2 (TAT 6-24 HRS) Nasopharyngeal Nasopharyngeal Swab     Status: None   Collection Time: 03/09/19  6:32 PM   Specimen: Nasopharyngeal Swab  Result Value Ref Range Status   SARS  Coronavirus 2 NEGATIVE NEGATIVE Final    Comment: (NOTE) SARS-CoV-2 target nucleic acids are NOT DETECTED. The SARS-CoV-2 RNA is generally detectable in upper and lower respiratory specimens during the acute phase of infection. Negative results do not preclude SARS-CoV-2 infection, do not rule out co-infections with other pathogens, and should not be used as the sole basis for treatment or other patient management decisions. Negative results must be combined with clinical observations, patient history, and epidemiological information. The expected result is Negative. Fact Sheet for Patients: SugarRoll.be Fact Sheet for Healthcare Providers: https://www.woods-mathews.com/ This test is not yet approved or cleared by the [place] FDA and  has been authorized for detection and/or diagnosis of SARS-CoV-2 by FDA under an Emergency Use Authorization (EUA). This EUA will remain  in effect (meaning this test can be used) for the duration of the COVID-19 declaration under Section  56 4(b)(1) of the Act, 21 U.S.C. section 360bbb-3(b)(1), unless the authorization is terminated or revoked sooner. Performed at [facility], [place] [address], [place], [place] [address]   MRSA PCR Screening     Status: None   Collection Time: 03/09/19  6:50 PM   Specimen: Nasopharyngeal  Result Value Ref Range Status   MRSA by PCR NEGATIVE NEGATIVE Final    Comment:        The GeneXpert MRSA Assay (FDA approved for NASAL specimens only), is one component of a comprehensive MRSA colonization surveillance program. It is not intended to diagnose MRSA infection nor to guide or monitor treatment for MRSA infections. Performed at [facility], [place] [address], [place], [place] [address]   Culture, Urine     Status: Abnormal   Collection Time: 03/11/19  1:31 PM   Specimen: Urine, Catheterized  Result Value Ref Range Status   Specimen Description   Final    URINE, CATHETERIZED Performed at [facility] [address] [place], [place] [address]    Special Requests   Final    Immunocompromised Performed at [facility], [place] [address] [place], [place] [address]    Culture (A)  Final    <10,000 COLONIES/mL INSIGNIFICANT GROWTH Performed at [facility] [address] [place], [place] [address]    Report Status 03/12/2019 FINAL  Final    Procedures/Studies: CT ABDOMEN PELVIS W CONTRAST  Result Date: 03/09/2019 CLINICAL DATA:  Weakness and dizziness. History of non-Hodgkin's lymphoma as well as anal cancer. Surgical history of diverting colostomy. 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 EXAM: CT ABDOMEN AND PELVIS WITH CONTRAST TECHNIQUE: Multidetector CT imaging of the abdomen and pelvis was performed using the standard protocol following bolus administration of intravenous contrast. CONTRAST:  14mL OMNIPAQUE IOHEXOL 300 MG/ML  SOLN COMPARISON:  CT abdomen dated 02/04/2019.  FINDINGS: Lower chest: The majority of the previously described pulmonary nodules at the lung bases have increased in size, again highly suggestive of metastatic disease. Hepatobiliary: No focal liver abnormality is seen. Gallbladder is decompressed. No bile duct dilatation seen. Pancreas: Unremarkable. No pancreatic ductal dilatation or surrounding inflammatory changes. Spleen: Normal in size without focal abnormality. Adrenals/Urinary Tract: Adrenal glands appear normal. Kidneys are unremarkable without mass, stone or hydronephrosis. Bladder walls are circumferentially thickened, similar to the previous exam. Suprapubic catheter in place. Stomach/Bowel: No significant change in the sizes of the 2 previously described masses at the inferior and superior aspects of a large cavity in the perineal region (images 77 and 95). No obstruction or other  complicating feature seen at the LEFT lower quadrant ostomy site. No dilated large or small bowel loops. Vascular/Lymphatic: Aortic atherosclerosis. No acute appearing vascular abnormality. Mildly prominent lymph nodes within the periaortic retroperitoneum and along the proximal iliac chain regions, suspicious for metastatic lymphadenopathy. Reproductive: Prostate gland is obscured. Other: No intraperitoneal free fluid or abscess collection identified. No free intraperitoneal air. Musculoskeletal: Erosive changes along the inferior and posterior margins of the LEFT inferior pubic ramus, possibly post treatment change, suspicious for local osseous metastasis. Stable lesions within the L3 vertebral body RIGHT iliac bone, as previously described, again suggestive of treated osseous metastases. No new osseous finding. IMPRESSION: 1. No acute findings within the abdomen or pelvis. No appreciable change compared to the earlier CT abdomen of 02/04/2019. 2. The majority of the previously described pulmonary nodules at the lung bases have increased in size, compatible with pulmonary  metastases. 3. No significant change in the sizes of the 2 previously described masses at the inferior and superior aspects of a large cavity in the perineal region, compatible with patient's previously described known anal malignancy. 4. Mildly prominent lymph nodes within the periaortic retroperitoneum and along the proximal iliac chain regions, compatible with previously described metastatic lymphadenopathy. 5. Erosive changes along the inferior and posterior margins of the LEFT inferior pubic ramus, possibly post treatment change, suspicious for local osseous metastasis. Stable lesions within the L3 vertebral body RIGHT iliac bone, again suggestive of treated osseous metastases. No new osseous finding. 6. Stable circumferential thickening of the walls of the bladder, again compatible with marked cystitis. Suprapubic Foley catheter remains in place. Electronically Signed   By: Franki Cabot M.D.   On: 03/09/2019 17:07   DG Chest Port 1 View  Result Date: 03/09/2019 CLINICAL DATA:  Dizziness for 2 days.  Possible sepsis. EXAM: PORTABLE CHEST 1 VIEW COMPARISON:  Single-view of the chest 02/04/2019, 11/07/2018. PET CT scan 03/135/2020. FINDINGS: Tiny nodular opacities in the right upper lobe are unchanged. No consolidative process, pneumothorax or effusion. Heart size is normal. IMPRESSION: No change in tiny right upper lobe nodular opacities. No acute disease. Electronically Signed   By: Inge Rise M.D.   On: 03/09/2019 15:05    Labs: BNP (last 3 results) No results for input(s): BNP in the last 8760 hours. Basic Metabolic Panel: Recent Labs  Lab 03/10/19 0519 03/11/19 0259 03/12/19 0239 03/13/19 0159 03/14/19 0441  NA 135 135 135 135 134*  K 4.0 4.3 3.8 4.0 3.8  CL 106 104 103 101 99  CO2 22 24 26 28 29   GLUCOSE 120* 95 88 100* 130*  BUN 9 9 9 6 9   CREATININE 0.61 0.56* 0.53* 0.59* 0.65  CALCIUM 8.9 8.4* 8.4* 8.5* 8.9  MG 1.8 1.7  --   --   --   PHOS  --  2.8  --   --   --     Liver Function Tests: Recent Labs  Lab 03/10/19 0519 03/11/19 0259 03/12/19 0239 03/13/19 0159 03/14/19 0441  AST 21 10* 8* 7* 12*  ALT 31 20 16 14 13   ALKPHOS 99 80 71 67 65  BILITOT 0.2* 0.1* 0.1* 0.3 0.4  PROT 6.8 6.2* 6.5 6.6 7.2  ALBUMIN 2.0* 1.9* 1.9* 2.0* 2.3*   No results for input(s): LIPASE, AMYLASE in the last 168 hours. No results for input(s): AMMONIA in the last 168 hours. CBC: Recent Labs  Lab 03/09/19 1422 03/10/19 0519 03/10/19 2143 03/11/19 0259 03/12/19 0239 03/13/19 0159 03/14/19 0441  WBC 16.7*   < >  11.5* 12.5* 9.1 8.5 9.5  NEUTROABS 13.9*  --   --  9.6* 6.4 6.2 7.1  HGB 7.4*   < > 8.0* 8.3* 8.4* 7.8* 9.0*  HCT 24.9*   < > 27.3* 27.7* 27.6* 26.3* 29.5*  MCV 71.8*   < > 76.3* 75.7* 74.2* 74.3* 74.1*  PLT 487*   < > 515* 476* 511* 442* 518*   < > = values in this interval not displayed.   Cardiac Enzymes: No results for input(s): CKTOTAL, CKMB, CKMBINDEX, TROPONINI in the last 168 hours. BNP: Invalid input(s): POCBNP CBG: No results for input(s): GLUCAP in the last 168 hours. D-Dimer No results for input(s): DDIMER in the last 72 hours. Hgb A1c No results for input(s): HGBA1C in the last 72 hours. Lipid Profile No results for input(s): CHOL, HDL, LDLCALC, TRIG, CHOLHDL, LDLDIRECT in the last 72 hours. Thyroid function studies No results for input(s): TSH, T4TOTAL, T3FREE, THYROIDAB in the last 72 hours.  Invalid input(s): FREET3 Anemia work up No results for input(s): VITAMINB12, FOLATE, FERRITIN, TIBC, IRON, RETICCTPCT in the last 72 hours. Urinalysis    Component Value Date/Time   COLORURINE YELLOW 03/11/2019 1331   APPEARANCEUR CLEAR 03/11/2019 1331   LABSPEC 1.012 03/11/2019 1331   PHURINE 6.0 03/11/2019 1331   GLUCOSEU NEGATIVE 03/11/2019 1331   HGBUR NEGATIVE 03/11/2019 1331   BILIRUBINUR NEGATIVE 03/11/2019 1331   KETONESUR 5 (A) 03/11/2019 1331   PROTEINUR NEGATIVE 03/11/2019 1331   NITRITE NEGATIVE 03/11/2019 1331    LEUKOCYTESUR MODERATE (A) 03/11/2019 1331   Sepsis Labs Invalid input(s): PROCALCITONIN,  WBC,  LACTICIDVEN Microbiology Recent Results (from the past 240 hour(s))  Blood Culture (routine x 2)     Status: None   Collection Time: 03/09/19  2:22 PM   Specimen: BLOOD  Result Value Ref Range Status   Specimen Description   Final    BLOOD RIGHT ANTECUBITAL Performed at [facility], [place] [address], [place], [place] [address]    Special Requests   Final    BOTTLES DRAWN AEROBIC AND ANAEROBIC Blood Culture adequate volume Performed at [facility] [address], [place], [place] [address]    Culture   Final    NO GROWTH 5 DAYS Performed at [facility], [place] [address], [place], [place] [address]    Report Status 03/14/2019 FINAL  Final  Blood Culture (routine x 2)     Status: None   Collection Time: 03/09/19  2:23 PM   Specimen: BLOOD LEFT HAND  Result Value Ref Range Status   Specimen Description   Final    BLOOD LEFT HAND Performed at [facility] [address] [place], [place] [address]    Special Requests   Final    BOTTLES DRAWN AEROBIC AND ANAEROBIC Blood Culture results may not be optimal due to an inadequate volume of blood received in culture bottles Performed at [facility] [address], [place], [place] [address]    Culture   Final    NO GROWTH 5 DAYS Performed at [facility], [place] [address] [place], [place] [address]    Report Status 03/14/2019 FINAL  Final  SARS CORONAVIRUS 2 (TAT 6-24 HRS) Nasopharyngeal Nasopharyngeal Swab     Status: None   Collection Time: 03/09/19  6:32 PM   Specimen: Nasopharyngeal Swab  Result Value Ref Range Status   SARS Coronavirus 2 NEGATIVE NEGATIVE Final    Comment: (NOTE) SARS-CoV-2 target nucleic acids  are NOT DETECTED. The SARS-CoV-2 RNA is generally detectable in upper and lower respiratory specimens during the acute phase of infection.  Negative results do not preclude SARS-CoV-2 infection, do not rule out co-infections with other pathogens, and should not be used as the sole basis for treatment or other patient management decisions. Negative results must be combined with clinical observations, patient history, and epidemiological information. The expected result is Negative. Fact Sheet for Patients: SugarRoll.be Fact Sheet for Healthcare Providers: https://www.woods-mathews.com/ This test is not yet approved or cleared by the [place] FDA and  has been authorized for detection and/or diagnosis of SARS-CoV-2 by FDA under an Emergency Use Authorization (EUA). This EUA will remain  in effect (meaning this test can be used) for the duration of the COVID-19 declaration under Section 56 4(b)(1) of the Act, 21 U.S.C. section 360bbb-3(b)(1), unless the authorization is terminated or revoked sooner. Performed at [facility], [place] [address] [place], [place] [address]   MRSA PCR Screening     Status: None   Collection Time: 03/09/19  6:50 PM   Specimen: Nasopharyngeal  Result Value Ref Range Status   MRSA by PCR NEGATIVE NEGATIVE Final    Comment:        The GeneXpert MRSA Assay (FDA approved for NASAL specimens only), is one component of a comprehensive MRSA colonization surveillance program. It is not intended to diagnose MRSA infection nor to guide or monitor treatment for MRSA infections. Performed at [facility], [place] [address], [place], [place] [address]   Culture, Urine     Status: Abnormal   Collection Time: 03/11/19  1:31 PM   Specimen: Urine, Catheterized  Result Value Ref Range Status   Specimen Description   Final    URINE, CATHETERIZED Performed at [facility] [address], [place], [place] [address]    Special Requests   Final    Immunocompromised Performed at [facility], [place]  [address], [place], [place] [address]    Culture (A)  Final    <10,000 COLONIES/mL INSIGNIFICANT GROWTH Performed at [facility] [place] [address], [place], [place] [address]    Report Status 03/12/2019 FINAL  Final    Time coordinating discharge: 35  minutes  SIGNED: Antonieta Pert, MD  [facility] 03/14/2019, 11:08 AM  If 7PM-7AM, please contact night-coverage [email or web address]

## 2019-03-14 NOTE — TOC Transition Note (Signed)
Transition of Care [facility]) - CM/SW Discharge Note   Patient Details  Name: Dylan [name] MRN: [id number] Date of Birth: 12/22/1970  Transition of Care [facility] CM/SW Contact:  Keneshia Tena, Marjie Skiff, RN Phone Number: 03/14/2019, 11:10 AM   Clinical Narrative:     Per CSW hand off pt was active with [facility] prior to admission for RN and wound care. MD orders needed for continued care at dc. This CM spoke with pt at bedside who states he would like to continue with [facility] at discharge.

## 2019-03-14 NOTE — Progress Notes (Signed)
PALLIATIVE NOTE:  Patient resting in bed, lying on his left side. Awake, A&O x3. Finishing up with breakfast. He denies shortness of breath. Reports his pain is being well managed.   Reviewed goals of care patient. He reports he is waiting for attending provider because he is hopeful he will get to discharge home today or tomorrow. Patient reports he is already connected to outpatient Palliative in the home and at [facility].   I attempted to discuss with Dylan [name] his full code status and care needs. Per notations he is not a candidate for any further surgical interventions or chemo/immunotherapy due to poor conditions. He has been evaluated at [facility] as well as by our teams her at [facility] with no recommendations or options. Patient acknowledges his awareness. He states he is not giving up no matter what.   Dylan [name] continues to request for full scope/aggressive care and to remain a full code despite awareness of poor prognosis and care being maximized. He again states he is awaiting the  "ok" to discharge home. He verbalizes appreciation of the care he is receiving and for the medical team's efforts.   Goals are set. All questions answered. No further palliative needs at this time as patient continues to request full code and full scope of care.   Assessment -awake, frail, chronically-ill appearing, cachectic -A&O x3, mood appropriate -RRR -large anal wound (not observed)   Plan -Full Code/Full Scope-as requested by patient -Continue with current plan of care per medical team -Patient is already connected to outpatient Palliative. Recommend this to continue at discharge. Palliative support outpatient will be important for continued goals of care discussions.  -PMT will continue to support and follow as needed. Please call if further needs to re-engage.   Time Total: 25 min.   Visit consisted of counseling and education dealing with the complex and emotionally intense issues of symptom  management and palliative care in the setting of serious and potentially life-threatening illness.Greater than 50%  of this time was spent counseling and coordinating care related to the above assessment and plan.  Alda Lea, AGPCNP-BC  Palliative Medicine Team [phone]

## 2019-03-14 NOTE — Care Management Important Message (Signed)
Important Message  Patient Details IM Letter given to Marney Doctor RN Case Manager to present to the Patient Name: Dylan [name] MRN: [id number] Date of Birth: March 03, 1970   Medicare Important Message Given:  Yes     Kerin Salen 03/14/2019, 9:22 AM

## 2019-03-14 NOTE — Progress Notes (Signed)
Pt discharged home with daughter in stable condition. Discharge instructions given and scripts sent to pharmacy of choice. No immediate questions or concerns at this time. Pt discharged via wheelchair.

## 2019-03-19 ENCOUNTER — Encounter: Payer: Self-pay | Admitting: Internal Medicine

## 2019-03-19 ENCOUNTER — Other Ambulatory Visit: Payer: Medicare Other | Admitting: Internal Medicine

## 2019-03-19 ENCOUNTER — Other Ambulatory Visit: Payer: Self-pay

## 2019-03-19 DIAGNOSIS — Z7189 Other specified counseling: Secondary | ICD-10-CM

## 2019-03-19 DIAGNOSIS — Z515 Encounter for palliative care: Secondary | ICD-10-CM

## 2019-03-19 NOTE — Progress Notes (Signed)
Feb 2nd, 2021 [facility] Palliative Care Consult Note Telephone: [phone]  Fax: [phone]     Due to the current COVID-19 infection/crises, the family prefer, and have given their verbal consent for, a provider visit via telemedicine. HIPPA policies of confidentially were discussed. Video-audio (telehealth) contact was unable to be done due technical barriers from the patient's side.   PATIENT NAME: Dylan [name] DOB: Jun 19, 1970 MRN: [id number] [place] [id number] [phone] [email or web address]   PRIMARY CARE PROVIDER:   [name], MD [address] [place],  [place] [address]   REFERRING PROVIDER:   Fredric Dine, NP [facility] Provider: Dr. Randon Goldsmith, Dr. Marena Chancy   RESPONSIBLE PARTY: (dtr/agent) Dylan [name] [phone]. (dtr) Dylan [name] [phone]. (dtr) Dylan [name] [phone]. (dtr) [place] / RECOMMENDATIONS:  1. Advance Care Planning:             A. Directives: Patient desires full resuscitative efforts in the event of a cardiopulmonary arrest.              B. Goals of Care: patient is s/p placement of a suprapubic catheter and diverting colostomy (early December 2020), to prevent fecal matter from contaminating his peri rectal wound. Around Christmas he was hospitalized for sepsis/septic shock, then more recently  1/23-1/28/2021 for septic shock. He is hoping to improve his performance status, so that he could be a candidate for immunotherapy. He is scheduled for a staging PET scan end of this week.   2. Symptom Management:             A. Pain (peri-rectal): States peri rectal pain very well managed with oxycodone averaging 20mg  tid, with an occasional 35 mg mid-afternoon as well. Continues taking a stool softner qod; stool from colostomy bag soft consistency              B. Weight loss: Patient states good appetite and eats 3 meals a day with 2-3  ensure/day. Denies nausea.    3. Cognitive / Functional status: Ambulating about without use of assistive devices. PT has not yet started; patient plans to call them ([facility]) to follow up. Independent in transfers, hygiene, and dressing. He changes his own peri rectal dressings bid, with dressing changes by [place] RN twice a week. No bleeding on gauze, but drainage of clear secretions "about1/2 pad's worth". He hasn't been taking his Iron b/c he ran out; plans to resume this. Patient manages his own colostomy bag and suprapubic tube. Supplies from [facility].   4. Family Supports / coping setting of life-threatening illness: (copied from previous note) Continue to reside with his daughter Cordelia Pen and her 3 sons and one daughter (ages 16-7). Patient greatly enjoys his grandchildren and they are a big reason why he wishes to pursue treatment; wants to see them grow up. Mentions he copes by taking "one day at a time" Patient has another daughter/HPOA Dylan [name] who usually is the child who accompanies patient to his doctor's appointments.    5. Follow up Palliative Care Visit: 04/30/2019 @ 2pm Telehealth/telephone call. This is not a scheduled visit. Patient wants me to call and try to catch him I spent 60 minutes providing this consultation from 4 to 5pm. More than 50% of the time in this consultation was spent coordinating communication.    HISTORY OF PRESENT ILLNESS:  Dylan [name] is a 49 y.o. male with  history of Hodgkin's lymphoma (1999; 1 yr chemo), perianal condyloma, and squamous cell carcinoma of the anus who has been lost to follow up for ~1.5 years, admitted to [facility] on 11/18/2018 with several months of bleeding from his perineum and marked progression of disease. Afebrile, tachycardic on admission, anemic to 7.3. Hospitalized 10/4-10/08/2018: Chest CT: worsening lung nodules c/w metastatic disease. MRI pelvis: locally invasive dx. Not a surgical cal. Rad Onc  recommended against further radiation with possible tumor embolization with VIR in future. Med oncology recommended PET scan OP and chemo. Tx 2 u's PRBCs of Hbg 7.3 01/04/19: Tx'd 2 units PRBSs. Plan a diverting ileostomy  to decreased peri-rectal wound contamination, then chemo 12/1-12/5/20202: Diverting colostomy and suprapubic tube (for retourethral fistula from invading anal cancer) At [facility]. 12/21-12/26/2020: hospitalization sepsis/septic shock, anemia (tx's 2 u's PRBCs), cancer cachexia 1/23-1/28/2021: hospitalization for septic shock (IVFs, Pressors, antibiotics; not a surgical candidate for wound; Midodrine). Anemia (Tx 1 u PRBCs).  Renal cancer s/p dirverting colostomy and suprapubic catheter/high-grade non-Hodgkin's lymphoma: not a current candidate for immunotherapy d/t poor performance status; would revisit if he gains strength/nutrition. Pressure ulcer: R groin Stage 2.  *scheduled for f/u staging PET scan skull base to thigh. Prior CT suggestive of osseous metastases to pelvis and LE3 vertebral body. This is a f/u Palliative Care Visit from Jan 14th.    CODE STATUS: Full Code   PPS: 60% HOSPICE ELIGIBILITY/DIAGNOSIS: TBD  PAST MEDICAL HISTORY:  Past Medical History:  Diagnosis Date  . Cancer ([place]) 2018   anal cancer  . Non Hodgkin's lymphoma ([place]) 1999  . S/P radiation therapy 07/07/14-7/20/216   anal ca     SOCIAL HX:  Social History   Tobacco Use  . Smoking status: Current Some Day Smoker    Packs/day: 0.30    Types: Cigarettes  . Smokeless tobacco: Current User  Substance Use Topics  . Alcohol use: No    ALLERGIES: No Known Allergies   PERTINENT MEDICATIONS:  Outpatient Encounter Medications as of 03/19/2019  Medication Sig  . acetaminophen (TYLENOL) 325 MG tablet Take 650 mg by mouth every 6 (six) hours as needed for pain.  . Amino Acids-Protein Hydrolys (FEEDING SUPPLEMENT, PRO-STAT SUGAR FREE 64,) LIQD Take 30 mLs by mouth 2 (two) times daily.  Marland Kitchen  amoxicillin-clavulanate (AUGMENTIN) 875-125 MG tablet Take 1 tablet by mouth 2 (two) times daily for 7 days.  Mar[place] Kitchen enoxaparin (LOVENOX) 40 MG/0.4ML injection Inject 40 mg into the skin daily.  . feeding supplement, ENSURE ENLIVE, (ENSURE ENLIVE) LIQD Take 237 mLs by mouth 2 (two) times daily between meals.  . ferrous sulfate 325 (65 FE) MG tablet Take 1 tablet (325 mg total) by mouth daily with breakfast.  . MELATONIN PO Take 1 capsule by mouth at bedtime as needed (sleep).   . metroNIDAZOLE (METROGEL) 0.75 % gel Apply 1 application topically daily as needed (rosacea).   . midodrine (PROAMATINE) 10 MG tablet Take 1 tablet (10 mg total) by mouth 3 (three) times daily with meals.  . Oxycodone HCl 20 MG TABS Take 20-30 mg by mouth See admin instructions. Take 1 tablet in the morning, take 1.5 tablets at lunch, and take 1 tablet at night   No facility-administered encounter medications on file as of 03/19/2019.    PHYSICAL EXAM:   PE deferred d/t tele-health/audio only nature of visit. Patient is alert and conversant. No dyspnea with conversation. Positive affect.  [name], NP

## 2019-03-20 ENCOUNTER — Telehealth: Payer: Self-pay | Admitting: *Deleted

## 2019-03-20 NOTE — Telephone Encounter (Signed)
Received call from [facility] to get verbal ok for home health orders. Advised that Dr. Lorenso Courier has okayed home health request. Pt has Palliative care through [facility].

## 2019-03-22 ENCOUNTER — Other Ambulatory Visit: Payer: Self-pay

## 2019-03-22 ENCOUNTER — Encounter (HOSPITAL_COMMUNITY)
Admission: RE | Admit: 2019-03-22 | Discharge: 2019-03-22 | Disposition: A | Discharge status: Home / Self Care | Payer: Medicare Other | Source: Ambulatory Visit | Attending: Hematology and Oncology | Admitting: Hematology and Oncology

## 2019-03-22 DIAGNOSIS — R918 Other nonspecific abnormal finding of lung field: Secondary | ICD-10-CM | POA: Diagnosis not present

## 2019-03-22 DIAGNOSIS — C210 Malignant neoplasm of anus, unspecified: Secondary | ICD-10-CM | POA: Diagnosis present

## 2019-03-22 LAB — GLUCOSE, CAPILLARY: Glucose-Capillary: 98 mg/dL (ref 70–99)

## 2019-03-22 MED ORDER — FLUDEOXYGLUCOSE F - 18 (FDG) INJECTION
6.9700 | Freq: Once | INTRAVENOUS | Status: AC | PRN
  Administered 2019-03-22: 12:00:00 6.97 via INTRAVENOUS

## 2019-03-25 ENCOUNTER — Other Ambulatory Visit: Payer: Self-pay

## 2019-03-25 ENCOUNTER — Telehealth (INDEPENDENT_AMBULATORY_CARE_PROVIDER_SITE_OTHER): Payer: Medicare Other | Admitting: Family Medicine

## 2019-03-25 DIAGNOSIS — C210 Malignant neoplasm of anus, unspecified: Secondary | ICD-10-CM | POA: Diagnosis not present

## 2019-03-25 DIAGNOSIS — G893 Neoplasm related pain (acute) (chronic): Secondary | ICD-10-CM

## 2019-03-25 DIAGNOSIS — D500 Iron deficiency anemia secondary to blood loss (chronic): Secondary | ICD-10-CM | POA: Diagnosis not present

## 2019-03-25 DIAGNOSIS — E440 Moderate protein-calorie malnutrition: Secondary | ICD-10-CM

## 2019-03-25 MED ORDER — FERROUS SULFATE 325 (65 FE) MG PO TABS: 325.0000 mg | ORAL_TABLET | Freq: Every day | ORAL | 1 refills | Status: DC

## 2019-03-25 NOTE — Patient Instructions (Addendum)
  Good talking to you today.  I am glad to hear that you are improving.  Continue iron supplement, I sent that to the pharmacy.  Lab only visit in the next couple of days will check your blood count and electrolytes.  Follow-up for another video visit in 1 week.  Let me know if there are questions sooner.  Take care!   If you have lab work done today you will be contacted with your lab results within the next 2 weeks.  If you have not heard from [place] then please contact us. The fastest way to get your results is to register for My Chart.   IF you received an x-ray today, you will receive an invoice from [facility]. Please contact [facility] at [phone] with questions or concerns regarding your invoice.   IF you received labwork today, you will receive an invoice from [facility]. Please contact [facility] at [phone] with questions or concerns regarding your invoice.   Our billing staff will not be able to assist you with questions regarding bills from these companies.  You will be contacted with the lab results as soon as they are available. The fastest way to get your results is to activate your My Chart account. Instructions are located on the last page of this paperwork. If you have not heard from [place] regarding the results in 2 weeks, please contact this office.

## 2019-03-25 NOTE — Progress Notes (Signed)
CC:  1 week hospital sepsis f/u.  No concerns for provider per pt. No recent weight or bp taken.  No travel outside the [place] or [place] in the past 3 weeks.

## 2019-03-25 NOTE — Progress Notes (Signed)
Virtual Visit via Video Note  I connected with [facility] on 03/25/19 at 1:36 PM by a video enabled telemedicine application and verified that I am speaking with the correct person using two identifiers.   I discussed the limitations, risks, security and privacy concerns of performing an evaluation and management service by telephone and the availability of in person appointments. I also discussed with the patient that there may be a patient responsible charge related to this service. The patient expressed understanding and agreed to proceed, consent obtained  Chief complaint: Chief Complaint  Patient presents with  . 1 week f/u sepsis    History of Present Illness: Dylan [name] is a 49 y.o. male  Follow-up from recent hospitalization. Admitted January 23-28, 2021  Chronic anal wound with metastatic anal cancer status post radiation with diverting colostomy, suprapubic catheter, admitted with increased drainage, decreased appetite.  Found to be in sepsis, septic shock.  Treated with aggressive IV fluid resuscitation, and pressors, weaned off pressors January 26.  Blood culture no growth, urine culture insignificant growth.  Seen by general surgery January 24 and not found to be surgical candidate.  Initially treated with cefepime, Flagyl, vancomycin then switched to [facility] January 27.  Wound care follow-up outpatient and discharged on Augmentin.  Also continued on midodrine 10 mg 3 times daily.  Plan for home wound care.  Notes reviewed but apparently was not thought to be a candidate for further intervention at [facility] or by [facility]/textile technologist.Did have a stage II pressure ulcer noted in the groin on the right, also plan for wound care.  Chronic anemia with hemoglobin baseline around 8, he did receive 1 unit packed red blood cells during recent hospitalization.  Continued on iron supplementation, had refused previous IV iron infusion with oncology.  1 week follow-up labs recommended.  Discharge  hemoglobin of 9.0 on January 28.  With failure to thrive and metastatic anal cancer, deconditioning, debility, goals of care were discussed with palliative care. He would like  remain at full code  Pain control, treated with oxycodone 20 mg with dosing of 20 mg the morning, 30 mg at lunch, 20 mg at night.  Previously prescribed by oncology.  Controlled substance database reviewed.  Last prescription on January 22 for #150 of oxycodone 20 mg. Same dose pain meds - working well  He feels like things are improving. Bleeding from anal wound has slowed down. No fevers. Few Augmentin left.  Taking iron supplement once per day. Not interested in IV iron at this time - last resort only.  Home Bp's - [id number] diastolic. Still taking midodrine 3 times per day.  Has nurse at home 2 times per week - bandages to both anal wound and pressure ulcer - both stable per his report.  Not planning on follow up with [facility]. Had hematology visit at [facility]. Trying to get iron and weight up.  Drinking ensure and improved diet to keep weight up - reports 140lbs when left hospital.     Patient Active Problem List   Diagnosis Date Noted  . Pressure injury of skin 03/11/2019  . Severe sepsis ([place]) 03/09/2019  . Wound discharge 03/09/2019  . Cancer cachexia ([place]) 02/06/2019  . Perforation of rectum from anal cancer s/p diverting colostomy 01/16/2019 02/06/2019  . Anal cellulitis 02/05/2019  . Septic shock ([place]) 02/05/2019  . 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 Colostomy - diverting loop colostomy in place Dec 2020 02/05/2019  . Suprapubic tube in  place Dec 2020 for rectourethral fistula 02/05/2019  . Sepsis due to undetermined organism ([place]) 02/04/2019  . Microcytic anemia 02/04/2019  . Sepsis secondary to UTI ([place]) 02/04/2019  . Malnutrition of moderate degree 10/20/2018  . Hypotension 10/18/2018  . Tachycardia 10/18/2018  . Anemia 10/18/2018  . Iron deficiency 10/18/2018  .  Sepsis ([place]) 10/18/2018  . Cancer associated pain 02/23/2017  . Metastatic anal cancer 02/10/2014   Past Medical History:  Diagnosis Date  . Cancer ([place]) 2018   anal cancer  . Non Hodgkin's lymphoma ([place]) 1999  . S/P radiation therapy 07/07/14-7/20/216   anal ca    Past Surgical History:  Procedure Laterality Date  . COLOSTOMY  2020   diverting colostomy at [facility]  . ILEOSTOMY  2020   diverting colostomy, [employer]   No Known Allergies Prior to Admission medications   Medication Sig Start Date End Date Taking? Authorizing Provider  acetaminophen (TYLENOL) 325 MG tablet Take 650 mg by mouth every 6 (six) hours as needed for pain. 11/21/18  Yes [provider]  Amino Acids-Protein Hydrolys (FEEDING SUPPLEMENT, PRO-STAT SUGAR FREE 64,) LIQD Take 30 mLs by mouth 2 (two) times daily. 03/14/19 04/13/19 Yes Antonieta Pert, MD  enoxaparin (LOVENOX) 40 MG/0.4ML injection Inject 40 mg into the skin daily. 01/19/19  Yes [provider]  feeding supplement, ENSURE ENLIVE, (ENSURE ENLIVE) LIQD Take 237 mLs by mouth 2 (two) times daily between meals. 03/14/19 04/13/19 Yes Antonieta Pert, MD  MELATONIN PO Take 1 capsule by mouth at bedtime as needed (sleep).    Yes [provider]  metroNIDAZOLE (METROGEL) 0.75 % gel Apply 1 application topically daily as needed (rosacea).  11/21/18  Yes [provider]  midodrine (PROAMATINE) 10 MG tablet Take 1 tablet (10 mg total) by mouth 3 (three) times daily with meals. 03/14/19 04/13/19 Yes Antonieta Pert, MD  Oxycodone HCl 20 MG TABS Take 20-30 mg by mouth See admin instructions. Take 1 tablet in the morning, take 1.5 tablets at lunch, and take 1 tablet at night 01/09/19  Yes [provider]  ferrous sulfate 325 (65 FE) MG tablet Take 1 tablet (325 mg total) by mouth daily with breakfast. 02/09/19 03/11/19  Shelly Coss, MD   Social History   Socioeconomic History  . Marital status: Single    Spouse name: Not on file  .  Number of children: Not on file  . Years of education: Not on file  . Highest education level: Not on file  Occupational History  . Not on file  Tobacco Use  . Smoking status: Current Some Day Smoker    Packs/day: 0.30    Types: Cigarettes  . Smokeless tobacco: Current User  Substance and Sexual Activity  . Alcohol use: No  . Drug use: Not on file  . Sexual activity: Not on file  Other Topics Concern  . Not on file  Social History Narrative  . Not on file   Social Determinants of Health   Financial Resource Strain:   . Difficulty of Paying Living Expenses: Not on file  Food Insecurity:   . Worried About Charity fundraiser in the Last Year: Not on file  . Ran Out of Food in the Last Year: Not on file  Transportation Needs:   . Lack of Transportation (Medical): Not on file  . Lack of Transportation (Non-Medical): Not on file  Physical Activity:   . Days of Exercise per Week: Not on file  . Minutes of Exercise per  Session: Not on file  Stress:   . Feeling of Stress : Not on file  Social Connections:   . Frequency of Communication with Friends and Family: Not on file  . Frequency of Social Gatherings with Friends and Family: Not on file  . Attends Religious Services: Not on file  . Active Member of Clubs or Organizations: Not on file  . Attends Archivist Meetings: Not on file  . Marital Status: Not on file  Intimate Partner Violence:   . Fear of Current or Ex-Partner: Not on file  . Emotionally Abused: Not on file  . Physically Abused: Not on file  . Sexually Abused: Not on file    Observations/Objective: There were no vitals filed for this visit. No distress, nontoxic on video call.  Normal respiratory effort.  Appropriate responses, all questions answered.  Assessment and Plan: Iron deficiency anemia due to chronic blood loss - Plan: ferrous sulfate 325 (65 FE) MG tablet, CBC, CANCELED: CBC  Moderate malnutrition (HCC) - Plan: Comprehensive metabolic  panel, CANCELED: Comprehensive metabolic panel  Cancer related pain  Squamous cell cancer, anus (HCC)  Status post hospitalization for sepsis, improving at home.  Has increased food intake along with nutritional supplements.  Reports improved bleeding from wound as well as continued wound care with home health/nursing.  Pain is controlled.  -Continue wound care at home with close monitoring, ER precautions given with any signs or symptoms of infection after he completes antibiotic.  -Unfortunately in a tough situation with metastatic anal cancer, no recent new surgical options recommended.  Discussed potential for repeat infection, and goals of care.  Declines any changes at this time.  -Continue oxycodone at current level for pain.  Will let me know prior to that expiring.  -Continue iron supplementation, plan for lab only visit for CBC, CMP.  Potential need for IV iron discussed but deferred at this time.  -Recheck 1 week.  Follow Up Instructions:   1 week video.   Patient Instructions    Good talking to you today.  I am glad to hear that you are improving.  Continue iron supplement, I sent that to the pharmacy.  Lab only visit in the next couple of days will check your blood count and electrolytes.  Follow-up for another video visit in 1 week.  Let me know if there are questions sooner.  Take care!   If you have lab work done today you will be contacted with your lab results within the next 2 weeks.  If you have not heard from [place] then please contact us. The fastest way to get your results is to register for My Chart.   IF you received an x-ray today, you will receive an invoice from [facility]. Please contact [facility] at [phone] with questions or concerns regarding your invoice.   IF you received labwork today, you will receive an invoice from [facility]. Please contact [facility] at [phone] with questions or concerns regarding your invoice.   Our billing  staff will not be able to assist you with questions regarding bills from these companies.  You will be contacted with the lab results as soon as they are available. The fastest way to get your results is to activate your My Chart account. Instructions are located on the last page of this paperwork. If you have not heard from [place] regarding the results in 2 weeks, please contact this office.       I discussed the assessment and treatment  plan with the patient. The patient was provided an opportunity to ask questions and all were answered. The patient agreed with the plan and demonstrated an understanding of the instructions.   The patient was advised to call back or seek an in-person evaluation if the symptoms worsen or if the condition fails to improve as anticipated.  I provided 15 minutes of non-face-to-face time during this encounter, on video, 15 mins chart review.    [name], MD

## 2019-04-01 ENCOUNTER — Telehealth: Payer: Self-pay | Admitting: Hematology and Oncology

## 2019-04-01 ENCOUNTER — Other Ambulatory Visit: Payer: Self-pay

## 2019-04-01 ENCOUNTER — Telehealth (INDEPENDENT_AMBULATORY_CARE_PROVIDER_SITE_OTHER): Payer: Medicare Other | Admitting: Family Medicine

## 2019-04-01 ENCOUNTER — Ambulatory Visit: Payer: Self-pay

## 2019-04-01 ENCOUNTER — Inpatient Hospital Stay: Payer: Medicare Other

## 2019-04-01 ENCOUNTER — Inpatient Hospital Stay: Payer: Medicare Other | Admitting: Hematology and Oncology

## 2019-04-01 ENCOUNTER — Telehealth: Payer: Self-pay

## 2019-04-01 DIAGNOSIS — C210 Malignant neoplasm of anus, unspecified: Secondary | ICD-10-CM | POA: Diagnosis not present

## 2019-04-01 DIAGNOSIS — E440 Moderate protein-calorie malnutrition: Secondary | ICD-10-CM

## 2019-04-01 DIAGNOSIS — G893 Neoplasm related pain (acute) (chronic): Secondary | ICD-10-CM | POA: Diagnosis not present

## 2019-04-01 DIAGNOSIS — D500 Iron deficiency anemia secondary to blood loss (chronic): Secondary | ICD-10-CM

## 2019-04-01 MED ORDER — OXYCODONE HCL 20 MG PO TABS: 20.0000 mg | ORAL_TABLET | ORAL | 0 refills | Status: DC

## 2019-04-01 NOTE — Telephone Encounter (Signed)
Scheduled appt per 2/15 sch message - pt aware of appt date and time   

## 2019-04-01 NOTE — Telephone Encounter (Signed)
TCT patient regarding his missed Lab and Dr's visit today, patient stated that he could not make this appointments because his ride did not show up. Requested that it be rescheduled.  Scheduling message sent.

## 2019-04-01 NOTE — Progress Notes (Signed)
Virtual Visit via Video Note  I connected with Dylan [name] on 04/01/19 at 12:19 PM by a video enabled telemedicine application and verified that I am speaking with the correct person using two identifiers.   I discussed the limitations, risks, security and privacy concerns of performing an evaluation and management service by telephone and the availability of in person appointments. I also discussed with the patient that there may be a patient responsible charge related to this service. The patient expressed understanding and agreed to proceed, consent obtained  Chief complaint:  Chief Complaint  Patient presents with  . Follow-up     pt states he feels alot better. medicaition is working well with no side effects    History of Present Illness: Dylan [name] is a 49 y.o. male  Metastatic squamous cell cancer, with no nutrition, iron deficiency anemia, cancer related pain.  Follow-up from February 8 visit. Has had ongoing wound care with home health, finishing antibiotic at our last visit. He was supplementing diet with Ensure as well as regular meals to keep his weight up at last visit.   Taking iron once per day.  Was continued midodrine 3 times per day, with home blood pressures in the 123456 systolic range. Oxycodone 20 mg in am, 30 mg afternoon, 20 mg at night has been working, if increased pain at night - may need to take extra. Every other night - 1 pill.  #150 rx on 03/08/19. Current MME/day 150.  bleeding form anal wound slightly better.  Home health 2x/week. Bandage changes 2 times per day.  Iron once per day.  No fevers, no dizziness, no increased pain, no new wounds off antibiotic.  BP 123456 systolic few days ago.  Feeling better still using nutritional supplement once per day. Meals: 3 per day usually.  Plans on nurse visit today if possible for blood work from last visit.    Lab Results  Component Value Date   WBC 9.5 03/14/2019   HGB 9.0 (L) 03/14/2019   HCT 29.5 (L)  03/14/2019   MCV 74.1 (L) 03/14/2019   PLT 518 (H) 03/14/2019     Patient Active Problem List   Diagnosis Date Noted  . Pressure injury of skin 03/11/2019  . Severe sepsis ([place]) 03/09/2019  . Wound discharge 03/09/2019  . Cancer cachexia ([place]) 02/06/2019  . Perforation of rectum from anal cancer s/p diverting colostomy 01/16/2019 02/06/2019  . Anal cellulitis 02/05/2019  . Septic shock ([place]) 02/05/2019  . 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 Colostomy - diverting loop colostomy in place Dec 2020 02/05/2019  . Suprapubic tube in place Dec 2020 for rectourethral fistula 02/05/2019  . Sepsis due to undetermined organism ([place]) 02/04/2019  . Microcytic anemia 02/04/2019  . Sepsis secondary to UTI ([place]) 02/04/2019  . Malnutrition of moderate degree 10/20/2018  . Hypotension 10/18/2018  . Tachycardia 10/18/2018  . Anemia 10/18/2018  . Iron deficiency 10/18/2018  . Sepsis ([place]) 10/18/2018  . Cancer associated pain 02/23/2017  . Metastatic anal cancer 02/10/2014   Past Medical History:  Diagnosis Date  . Cancer ([place]) 2018   anal cancer  . Non Hodgkin's lymphoma ([place]) 1999  . S/P radiation therapy 07/07/14-7/20/216   anal ca    Past Surgical History:  Procedure Laterality Date  . COLOSTOMY  2020   diverting colostomy at [facility]  . ILEOSTOMY  2020   diverting colostomy, [facility] [employer]   No Known Allergies Prior to Admission medications   Medication Sig  Start Date End Date Taking? Authorizing Provider  acetaminophen (TYLENOL) 325 MG tablet Take 650 mg by mouth every 6 (six) hours as needed for pain. 11/21/18  Yes [provider]  Amino Acids-Protein Hydrolys (FEEDING SUPPLEMENT, PRO-STAT SUGAR FREE 64,) LIQD Take 30 mLs by mouth 2 (two) times daily. 03/14/19 04/13/19 Yes Antonieta Pert, MD  enoxaparin (LOVENOX) 40 MG/0.4ML injection Inject 40 mg into the skin daily. 01/19/19  Yes [provider]  feeding supplement, ENSURE ENLIVE, (ENSURE ENLIVE)  LIQD Take 237 mLs by mouth 2 (two) times daily between meals. 03/14/19 04/13/19 Yes Antonieta Pert, MD  ferrous sulfate 325 (65 FE) MG tablet Take 1 tablet (325 mg total) by mouth daily with breakfast. 03/25/19 04/24/19 Yes [name], MD  MELATONIN PO Take 1 capsule by mouth at bedtime as needed (sleep).    Yes [provider]  metroNIDAZOLE (METROGEL) 0.75 % gel Apply 1 application topically daily as needed (rosacea).  11/21/18  Yes [provider]  midodrine (PROAMATINE) 10 MG tablet Take 1 tablet (10 mg total) by mouth 3 (three) times daily with meals. 03/14/19 04/13/19 Yes Antonieta Pert, MD  Oxycodone HCl 20 MG TABS Take 20-30 mg by mouth See admin instructions. Take 1 tablet in the morning, take 1.5 tablets at lunch, and take 1 tablet at night 01/09/19  Yes [provider]   Social History   Socioeconomic History  . Marital status: Single    Spouse name: Not on file  . Number of children: Not on file  . Years of education: Not on file  . Highest education level: Not on file  Occupational History  . Not on file  Tobacco Use  . Smoking status: Current Some Day Smoker    Packs/day: 0.30    Types: Cigarettes  . Smokeless tobacco: Current User  Substance and Sexual Activity  . Alcohol use: No  . Drug use: Not on file  . Sexual activity: Not on file  Other Topics Concern  . Not on file  Social History Narrative  . Not on file   Social Determinants of Health   Financial Resource Strain:   . Difficulty of Paying Living Expenses: Not on file  Food Insecurity:   . Worried About Charity fundraiser in the Last Year: Not on file  . Ran Out of Food in the Last Year: Not on file  Transportation Needs:   . Lack of Transportation (Medical): Not on file  . Lack of Transportation (Non-Medical): Not on file  Physical Activity:   . Days of Exercise per Week: Not on file  . Minutes of Exercise per Session: Not on file  Stress:   . Feeling of Stress : Not on file   Social Connections:   . Frequency of Communication with Friends and Family: Not on file  . Frequency of Social Gatherings with Friends and Family: Not on file  . Attends Religious Services: Not on file  . Active Member of Clubs or Organizations: Not on file  . Attends Archivist Meetings: Not on file  . Marital Status: Not on file  Intimate Partner Violence:   . Fear of Current or Ex-Partner: Not on file  . Emotionally Abused: Not on file  . Physically Abused: Not on file  . Sexually Abused: Not on file    Observations/Objective: There were no vitals filed for this visit. No vitals obtained today.  On video there was no distress, appropriate responses, appears nontoxic, speaking in full sentences.  All questions answered with understanding of plan expressed.  Assessment and Plan: Squamous cell cancer, anus (HCC) - Plan: Oxycodone HCl 20 MG TABS  Cancer related pain - Plan: Oxycodone HCl 20 MG TABS  Iron deficiency anemia due to chronic blood loss  Moderate malnutrition (HCC)  Symptomatically improved, denies any fevers, dizziness/near syncopal symptoms, no apparent new wounds.  Bleeding has improved, has home care in place as well as able to change bandages daily.  Pain controlled with oxycodone with episodic need for nighttime dosing.  Tolerating p.o. fluids, foods at home as well as nutritional supplement.  -Plan for lab visit possibly today for CBC, CMP to evaluate albumin and previous anemia.  Continue iron for now.  -Refill ordered for oxycodone, controlled substance database reviewed.  -Blood pressure has remained stable on midodrine.  -Recheck 2 weeks, ER precautions given if any worsening sooner.  Follow Up Instructions: 2 weeks.  Virtual visit.   I discussed the assessment and treatment plan with the patient. The patient was provided an opportunity to ask questions and all were answered. The patient agreed with the plan and demonstrated an understanding of the  instructions.   The patient was advised to call back or seek an in-person evaluation if the symptoms worsen or if the condition fails to improve as anticipated.  I provided 12 minutes of non-face-to-face time during this encounter.   [name], MD

## 2019-04-05 ENCOUNTER — Inpatient Hospital Stay: Payer: Medicare Other | Attending: Hematology and Oncology

## 2019-04-05 ENCOUNTER — Inpatient Hospital Stay: Payer: Medicare Other | Admitting: Hematology and Oncology

## 2019-04-05 ENCOUNTER — Other Ambulatory Visit: Payer: Self-pay | Admitting: *Deleted

## 2019-04-05 ENCOUNTER — Other Ambulatory Visit: Payer: Self-pay

## 2019-04-05 DIAGNOSIS — C210 Malignant neoplasm of anus, unspecified: Secondary | ICD-10-CM

## 2019-04-09 ENCOUNTER — Telehealth: Payer: Self-pay | Admitting: Hematology and Oncology

## 2019-04-09 NOTE — Telephone Encounter (Signed)
Returned patient's phone call regarding rescheduling 02/19 appointment, per patient's request appointment has moved to 03/05.

## 2019-04-11 ENCOUNTER — Telehealth: Payer: Self-pay | Admitting: Hematology and Oncology

## 2019-04-11 NOTE — Telephone Encounter (Signed)
Rescheduled 3/5 appt to 3/11 per provider PAL. Pt is aware of new appt date and time.

## 2019-04-15 ENCOUNTER — Other Ambulatory Visit: Payer: Self-pay

## 2019-04-15 ENCOUNTER — Telehealth (INDEPENDENT_AMBULATORY_CARE_PROVIDER_SITE_OTHER): Payer: Medicare Other | Admitting: Family Medicine

## 2019-04-15 DIAGNOSIS — G893 Neoplasm related pain (acute) (chronic): Secondary | ICD-10-CM | POA: Diagnosis not present

## 2019-04-15 DIAGNOSIS — D500 Iron deficiency anemia secondary to blood loss (chronic): Secondary | ICD-10-CM

## 2019-04-15 DIAGNOSIS — C210 Malignant neoplasm of anus, unspecified: Secondary | ICD-10-CM

## 2019-04-15 MED ORDER — FERROUS SULFATE 325 (65 FE) MG PO TABS: 325.0000 mg | ORAL_TABLET | Freq: Every day | ORAL | 1 refills | Status: DC

## 2019-04-15 NOTE — Patient Instructions (Signed)
° ° ° °  If you have lab work done today you will be contacted with your lab results within the next 2 weeks.  If you have not heard from us then please contact us. The fastest way to get your results is to register for My Chart. ° ° °IF you received an x-ray today, you will receive an invoice from [facility]. Please contact [facility] at [phone] with questions or concerns regarding your invoice.  ° °IF you received labwork today, you will receive an invoice from [facility]. Please contact [facility] at [phone] with questions or concerns regarding your invoice.  ° °Our billing staff will not be able to assist you with questions regarding bills from these companies. ° °You will be contacted with the lab results as soon as they are available. The fastest way to get your results is to activate your My Chart account. Instructions are located on the last page of this paperwork. If you have not heard from us regarding the results in 2 weeks, please contact this office. °  ° ° ° °

## 2019-04-15 NOTE — Progress Notes (Signed)
Virtual Visit via Video Note  I connected with Dylan [name] on 04/15/19 at 3:03 PM by a video enabled telemedicine application [employer] and verified that I am speaking with the correct person using two identifiers.   I discussed the limitations, risks, security and privacy concerns of performing an evaluation and management service by telephone and the availability of in person appointments. I also discussed with the patient that there may be a patient responsible charge related to this service. The patient expressed understanding and agreed to proceed, consent obtained  Chief complaint:  Chief Complaint  Patient presents with  . Follow-up    Squamous cell cancer. pain is same as last visit. pt states medication are working well and pt reports no new symptoms     History of Present Illness: Dylan [name] is a 49 y.o. male   Metastatic squamous cell anal cancer: Oncology appointment rescheduled for March 11h at [facility] oncology, Dr. Lorenso Courier.    wound care with home health, supplementing diet with Ensure, iron once per day.  Midodrine 3 times per day.  Reported improvement of bleeding from anal wound up last telemedicine visit February 15.  Home health was coming out 2 times per week bandage changes by himself 2 times per day.  3 meals per day with nutritional supplement once per day.  CBC and CMP were ordered February 8, with plan for a lab only visit to have those obtained to eval nutritional stores, anemia. He has not had blood work done.  Oxycodone 20 mg in the morning, 30 mg in the afternoon, 20 mg at night had been treating pain.  Refill was ordered February 15 visit.  Since last visit - feeling ok. Eating 3 meals per day. Still 1 Ensure per day.  Pain controlled on current regimen.  Anal bleeding about the same. No lightheadedness. Iron once per day.  Midodrine 3 times per day. Unsure about BP readings recently - home health visit visit in 2 days.  No fever/chills or new  fatigue.       Patient Active Problem List   Diagnosis Date Noted  . Pressure injury of skin 03/11/2019  . Severe sepsis ([place]) 03/09/2019  . Wound discharge 03/09/2019  . Cancer cachexia ([place]) 02/06/2019  . Perforation of rectum from anal cancer s/p diverting colostomy 01/16/2019 02/06/2019  . Anal cellulitis 02/05/2019  . Septic shock ([place]) 02/05/2019  . 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 Colostomy - diverting loop colostomy in place Dec 2020 02/05/2019  . Suprapubic tube in place Dec 2020 for rectourethral fistula 02/05/2019  . Sepsis due to undetermined organism ([place]) 02/04/2019  . Microcytic anemia 02/04/2019  . Sepsis secondary to UTI ([place]) 02/04/2019  . Malnutrition of moderate degree 10/20/2018  . Hypotension 10/18/2018  . Tachycardia 10/18/2018  . Anemia 10/18/2018  . Iron deficiency 10/18/2018  . Sepsis ([place]) 10/18/2018  . Cancer associated pain 02/23/2017  . Metastatic anal cancer 02/10/2014   Past Medical History:  Diagnosis Date  . Cancer ([place]) 2018   anal cancer  . Non Hodgkin's lymphoma ([place]) 1999  . S/P radiation therapy 07/07/14-7/20/216   anal ca    Past Surgical History:  Procedure Laterality Date  . COLOSTOMY  2020   diverting colostomy at [facility]  . ILEOSTOMY  2020   diverting colostomy, [employer]   No Known Allergies Prior to Admission medications   Medication Sig Start Date End Date Taking? Authorizing Provider  acetaminophen (TYLENOL) 325 MG  tablet Take 650 mg by mouth every 6 (six) hours as needed for pain. 11/21/18  Yes [provider]  enoxaparin (LOVENOX) 40 MG/0.4ML injection Inject 40 mg into the skin daily. 01/19/19  Yes [provider]  ferrous sulfate 325 (65 FE) MG tablet Take 1 tablet (325 mg total) by mouth daily with breakfast. 03/25/19 04/24/19 Yes [name], MD  MELATONIN PO Take 1 capsule by mouth at bedtime as needed (sleep).    Yes [provider]  metroNIDAZOLE  (METROGEL) 0.75 % gel Apply 1 application topically daily as needed (rosacea).  11/21/18  Yes [provider]  Oxycodone HCl 20 MG TABS Take 1-1.5 tablets (20-30 mg total) by mouth See admin instructions. Take 1 tablet in the morning, take 1.5 tablets at lunch, and take 1 tablet at night 04/01/19  Yes [name], MD   Social History   Socioeconomic History  . Marital status: Single    Spouse name: Not on file  . Number of children: Not on file  . Years of education: Not on file  . Highest education level: Not on file  Occupational History  . Not on file  Tobacco Use  . Smoking status: Current Some Day Smoker    Packs/day: 0.30    Types: Cigarettes  . Smokeless tobacco: Current User  Substance and Sexual Activity  . Alcohol use: No  . Drug use: Not on file  . Sexual activity: Not on file  Other Topics Concern  . Not on file  Social History Narrative  . Not on file   Social Determinants of Health   Financial Resource Strain:   . Difficulty of Paying Living Expenses: Not on file  Food Insecurity:   . Worried About Charity fundraiser in the Last Year: Not on file  . Ran Out of Food in the Last Year: Not on file  Transportation Needs:   . Lack of Transportation (Medical): Not on file  . Lack of Transportation (Non-Medical): Not on file  Physical Activity:   . Days of Exercise per Week: Not on file  . Minutes of Exercise per Session: Not on file  Stress:   . Feeling of Stress : Not on file  Social Connections:   . Frequency of Communication with Friends and Family: Not on file  . Frequency of Social Gatherings with Friends and Family: Not on file  . Attends Religious Services: Not on file  . Active Member of Clubs or Organizations: Not on file  . Attends Archivist Meetings: Not on file  . Marital Status: Not on file  Intimate Partner Violence:   . Fear of Current or Ex-Partner: Not on file  . Emotionally Abused: Not on file  . Physically Abused:  Not on file  . Sexually Abused: Not on file    Observations/Objective: There were no vitals filed for this visit. Nontoxic appearance on video, appropriate responses, speaking in full sentences with no distress.  All questions were answered with understanding of plan expressed  Assessment and Plan: Cancer related pain Squamous cell cancer, anus (HCC)  - stable pain control, adequate nutrition by description. Follow up as planned with oncology, continue oxycodone same dose for now.    -denies signs or symptoms of infection and continuing wound care. ER precautions discussed.   Iron deficiency anemia due to chronic blood loss - Plan: ferrous sulfate 325 (65 FE) MG tablet  - plan for lab only visit for CBC. Asymptomatic. Continue iron.  Follow Up Instructions: 1 month.   Patient Instructions       If you have lab work done today you will be contacted with your lab results within the next 2 weeks.  If you have not heard from [place] then please contact us. The fastest way to get your results is to register for My Chart.   IF you received an x-ray today, you will receive an invoice from [facility]. Please contact [facility] at [phone] with questions or concerns regarding your invoice.   IF you received labwork today, you will receive an invoice from [facility]. Please contact [facility] at [phone] with questions or concerns regarding your invoice.   Our billing staff will not be able to assist you with questions regarding bills from these companies.  You will be contacted with the lab results as soon as they are available. The fastest way to get your results is to activate your My Chart account. Instructions are located on the last page of this paperwork. If you have not heard from [place] regarding the results in 2 weeks, please contact this office.         I discussed the assessment and treatment plan with the patient. The patient was provided an opportunity to  ask questions and all were answered. The patient agreed with the plan and demonstrated an understanding of the instructions.   The patient was advised to call back or seek an in-person evaluation if the symptoms worsen or if the condition fails to improve as anticipated.  I provided 7 minutes of non-face-to-face time during this encounter, 5 min chart review.    [name], MD

## 2019-04-16 NOTE — Progress Notes (Signed)
Spoke with pt and scheduled nurse visit. Pt also returning for OV in April

## 2019-04-17 ENCOUNTER — Telehealth: Payer: Self-pay | Admitting: *Deleted

## 2019-04-17 NOTE — Telephone Encounter (Signed)
Received call from Sharyn Lull, [place] with [facility] requesting verbal order for continued skilled nurse visits for wound care. Gave verbal orders for ongoing wound care. Asked michelle how patient was doing as he had missed his last appts here. Informed her that his next appt is 04/25/19 at 2:30pm.  Sharyn Lull stated that she would talk to him about this . She sees him this afternoon.  Dr. Lorenso Courier aware of the above

## 2019-04-19 ENCOUNTER — Ambulatory Visit: Payer: Medicare Other | Admitting: Hematology and Oncology

## 2019-04-19 ENCOUNTER — Other Ambulatory Visit: Payer: Medicare Other

## 2019-04-24 ENCOUNTER — Ambulatory Visit: Payer: Medicare Other

## 2019-04-25 ENCOUNTER — Inpatient Hospital Stay: Payer: Medicare Other | Attending: Hematology and Oncology

## 2019-04-25 ENCOUNTER — Inpatient Hospital Stay (HOSPITAL_BASED_OUTPATIENT_CLINIC_OR_DEPARTMENT_OTHER): Payer: Medicare Other | Admitting: Hematology and Oncology

## 2019-04-25 ENCOUNTER — Other Ambulatory Visit: Payer: Self-pay

## 2019-04-25 ENCOUNTER — Encounter: Payer: Self-pay | Admitting: Hematology and Oncology

## 2019-04-25 ENCOUNTER — Telehealth: Payer: Self-pay | Admitting: *Deleted

## 2019-04-25 ENCOUNTER — Other Ambulatory Visit: Payer: Self-pay | Admitting: *Deleted

## 2019-04-25 ENCOUNTER — Ambulatory Visit: Payer: Medicare Other

## 2019-04-25 VITALS — BP 92/66 | HR 131 | Temp 99.2°F | Resp 18 | Ht 73.0 in | Wt 128.3 lb

## 2019-04-25 DIAGNOSIS — C210 Malignant neoplasm of anus, unspecified: Secondary | ICD-10-CM | POA: Insufficient documentation

## 2019-04-25 DIAGNOSIS — E861 Hypovolemia: Secondary | ICD-10-CM

## 2019-04-25 DIAGNOSIS — I9589 Other hypotension: Secondary | ICD-10-CM | POA: Diagnosis not present

## 2019-04-25 DIAGNOSIS — Z7901 Long term (current) use of anticoagulants: Secondary | ICD-10-CM | POA: Insufficient documentation

## 2019-04-25 DIAGNOSIS — Z8572 Personal history of non-Hodgkin lymphomas: Secondary | ICD-10-CM | POA: Diagnosis not present

## 2019-04-25 DIAGNOSIS — Z79899 Other long term (current) drug therapy: Secondary | ICD-10-CM | POA: Insufficient documentation

## 2019-04-25 DIAGNOSIS — R000 Tachycardia, unspecified: Secondary | ICD-10-CM

## 2019-04-25 DIAGNOSIS — E440 Moderate protein-calorie malnutrition: Secondary | ICD-10-CM

## 2019-04-25 DIAGNOSIS — D500 Iron deficiency anemia secondary to blood loss (chronic): Secondary | ICD-10-CM

## 2019-04-25 DIAGNOSIS — Z923 Personal history of irradiation: Secondary | ICD-10-CM | POA: Diagnosis not present

## 2019-04-25 DIAGNOSIS — R918 Other nonspecific abnormal finding of lung field: Secondary | ICD-10-CM | POA: Insufficient documentation

## 2019-04-25 DIAGNOSIS — F17210 Nicotine dependence, cigarettes, uncomplicated: Secondary | ICD-10-CM | POA: Insufficient documentation

## 2019-04-25 LAB — CBC WITH DIFFERENTIAL (CANCER CENTER ONLY)
Abs Immature Granulocytes: 0.09 10*3/uL — ABNORMAL HIGH (ref 0.00–0.07)
Basophils Absolute: 0 10*3/uL (ref 0.0–0.1)
Basophils Relative: 0 %
Eosinophils Absolute: 0.2 10*3/uL (ref 0.0–0.5)
Eosinophils Relative: 1 %
HCT: 24.5 % — ABNORMAL LOW (ref 39.0–52.0)
Hemoglobin: 7.4 g/dL — ABNORMAL LOW (ref 13.0–17.0)
Immature Granulocytes: 1 %
Lymphocytes Relative: 9 %
Lymphs Abs: 1.4 10*3/uL (ref 0.7–4.0)
MCH: 20.7 pg — ABNORMAL LOW (ref 26.0–34.0)
MCHC: 30.2 g/dL (ref 30.0–36.0)
MCV: 68.4 fL — ABNORMAL LOW (ref 80.0–100.0)
Monocytes Absolute: 0.9 10*3/uL (ref 0.1–1.0)
Monocytes Relative: 6 %
Neutro Abs: 12.5 10*3/uL — ABNORMAL HIGH (ref 1.7–7.7)
Neutrophils Relative %: 83 %
Platelet Count: 731 10*3/uL — ABNORMAL HIGH (ref 150–400)
RBC: 3.58 MIL/uL — ABNORMAL LOW (ref 4.22–5.81)
RDW: 19.5 % — ABNORMAL HIGH (ref 11.5–15.5)
WBC Count: 15.3 10*3/uL — ABNORMAL HIGH (ref 4.0–10.5)
nRBC: 0 % (ref 0.0–0.2)

## 2019-04-25 LAB — CMP (CANCER CENTER ONLY)
ALT: 14 U/L (ref 0–44)
AST: 10 U/L — ABNORMAL LOW (ref 15–41)
Albumin: 2.2 g/dL — ABNORMAL LOW (ref 3.5–5.0)
Alkaline Phosphatase: 126 U/L (ref 38–126)
Anion gap: 9 (ref 5–15)
BUN: 9 mg/dL (ref 6–20)
CO2: 24 mmol/L (ref 22–32)
Calcium: 9.6 mg/dL (ref 8.9–10.3)
Chloride: 98 mmol/L (ref 98–111)
Creatinine: 0.75 mg/dL (ref 0.61–1.24)
GFR, Est AFR Am: >60 mL/min (ref >60)
GFR, Estimated: >60 mL/min (ref >60)
Glucose, Bld: 114 mg/dL — ABNORMAL HIGH (ref 70–99)
Potassium: 4 mmol/L (ref 3.5–5.1)
Sodium: 131 mmol/L — ABNORMAL LOW (ref 135–145)
Total Bilirubin: <0.2 mg/dL — ABNORMAL LOW (ref 0.3–1.2)
Total Protein: 8 g/dL (ref 6.5–8.1)

## 2019-04-25 LAB — SAMPLE TO BLOOD BANK

## 2019-04-25 LAB — PREPARE RBC (CROSSMATCH)

## 2019-04-25 NOTE — Progress Notes (Signed)
[place] Telephone:([phone]) [phone]   [phone]  PROGRESS NOTE  Patient Care Team: [name], MD as PCP - General (Family Medicine) [name], NP as Nurse Practitioner (Hospice and Palliative Medicine) Stitzenberg, Clint Lipps, MD as Referring Physician (Surgical Oncology) Gwinda Maine, MD as Referring Physician (Surgical Oncology) Nicholas Lose, MD as Consulting Physician (Hematology and Oncology)  Hematological/Oncological History # Metastatic Anal Cancer (summarized history obtained from [facility] Surgical Notes) 1) 03/09/10: CT scan performed showed superficial inflammation along the medial crease of the left buttocks within the subcutaneous fat measuring 2.5 x 5 cm including no areas of deep penetration with a few associated bubbles of air and gas present.  2) 03/10/10: A 2 x 2 cm area of condylomata was present in the perianal area that was excised by Dr. [name], along with multiple I&Ds of left gluteal abscesses. 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 No lymph nodes biopsied. 3) 03/29/10: PET scan showed abnormal 5.7 x 7.1 cm hypermetabolic soft tissue in the perianal region is compatible with patient's history of squamous cell carcinoma. Bilateral inguinal adenopathy, largest on left is hypermetabolic and measures 1.8 x 2.9 cm, and largest on the right measures 1.2 cm without hypermetabolism; question secondary to infection or metastatic involvement 4) 04/2010: Evaluated by Radiation Oncologist Dr. [name] at [facility]. He was not deemed a candidate for radiation given concern for metastatic disease based on PET scan. 5) 06/22/10: Recommended to begin systemic therapy with cisplatin and continuous infusion 5-FU for metastatic disease. 6) 07/2010: Re-evaluated at [facility] by Dr. [name] and Dr. [name], as he did not have biopsy proven metastatic disease, and inguinal adenopathy may be reactive due  to infection/inflammation/abscess. 7) 07/22/10: FNA of left inguinal lymph node identified no malignant cells, only polymorphous lymphoid material 8) 07/27/10: exam under anesthesia reveals suprapubic condyloma excision showed condyloma acuminatum with extensive high grade squamous dysplasia with areas of evolution to squamous cell carcinoma in situ, but negative for definite invasion. 9) 02/10/14: After lost to follow-up, he returned to [facility] for evaluation by Dr. [name] due to concern for recurrence of disease throughout the perineum and perianal areas. The most worrisome is a large ulcer on the left perianal area.  10) 02/18/14: Exam under anesthesia with biopsies performed. Left perianal region biopsy confirmed invasive squamous cell carcinoma, moderate-to-poorly differentiated. Patient refused at this time APR. 11) 05/01/14: PET CT: Perianal and left groin/penile malignancy with bilateral inguinal and left external iliac metastases. 12) 07/07/14 - 09/03/14: XRT. Patient declined recommended concurrent 5FU/[facility]. At [place]. Patient refused APR. 13) 12/19/16: PET CT: No suspicious metabolically active osseous lesions are identified - Interval increase in the extent of the hypermetabolic activity involving the region of the anus which extends up into the fascia adjacent the rectum and out into the bilateral buttocks associated with soft tissue erosion and skin thickening. 14) 12/19/16: MRI Pelvis: Motion artefact. Anal tumor with gross invasion of the anal sphincters and extension into the ischioanal fossa, with irregular appearance of the perineum/gluteal skin, also abutting the pelvic sidewall (left > right) and likely invading prostate apex. Overall, this is increased from prior CT 01/05/2015, concerning for disease progression. Exam revealed obliteration of the anus. Patient refused APR. 15) 03/2018 - consideration for pembrolizumab - patient declined. 16) 10/2018 - represents with substantial bleeding  episodes from erosive, locally-advanced anal cancer  17) 11/12/2018: reviewed by [facility] surgery again. Admitted for consideration of ostomy from 10/4 to 11/21/2018.  18) 12/10/18-12/12/18: patient seen by [place] (Dr. Dennison Nancy) and Surgical Oncology.  MRI pelvis, and CT chest performed. 19) 12/19/2018: establish care with Dr. Lorenso Courier  20)  12/21/2018: 4th opinion received at [facility] [place] with Dr. [name].  21) 01/17/2019: Underwent a diverting colostomy with placement of a colostomy bag at [facility]. Tumor tissue excised for pathological review.  22) 12/21-12/26/2020: admitted to [facility] for marked anemia, weakness and hypotension. 23) 03/09/2019-03/14/2019: admitted to [facility] for weakness and hypotension, found to have severe sepsis.   #High Grade Non-Hodgkins Lymphoma 1)1999: He was treated for high-grade Non-Hodgkin's Lymphoma by Dr. [name] in [place], [place]. He received a year of chemotherapy. No radiation.   Interval History:  Dylan [name] 49 y.o. male with medical history significant for metastatic anal cancer presents for a follow up visit. He was last seen in our clinic on 03/04/2019. In the interim he was admitted to [facility] from 03/09/2019 to 03/14/2019 due to lightheadedness and increased discharge from his wound.   On exam today Dylan [name] appears improved from prior.  He is sitting up in a wheelchair and is alert and attentive during our visit today.  He notes that he has been having issues with lightheadedness and dizziness.  He reports that he was having abdominal cramps throughout most of the day, but he notes that this is due to eating spaghetti which sat out overnight.  He reports that his pain is currently a 4 out of 10 in severity while sitting on the wheelchair.  He is currently taking his oxycodone 3 times a day 20 mg in the morning 30 mg in the afternoon and 20 mg at night.  He continues to produce stool through his ostomy bag, however he does  note occasional stool from his backside ending up in his wound.  His urine is dark of color as of late, but not particularly malodorous.  A full 10 point ROS is listed below.  MEDICAL HISTORY:  Past Medical History:  Diagnosis Date  . Cancer ([place]) 2018   anal cancer  . Non Hodgkin's lymphoma ([place]) 1999  . S/P radiation therapy 07/07/14-7/20/216   anal ca     SURGICAL HISTORY: Past Surgical History:  Procedure Laterality Date  . COLOSTOMY  2020   diverting colostomy at [facility]  . ILEOSTOMY  2020   diverting colostomy, [employer]    SOCIAL HISTORY: Social History   Socioeconomic History  . Marital status: Single    Spouse name: Not on file  . Number of children: Not on file  . Years of education: Not on file  . Highest education level: Not on file  Occupational History  . Not on file  Tobacco Use  . Smoking status: Current Some Day Smoker    Packs/day: 0.30    Types: Cigarettes  . Smokeless tobacco: Current User  Substance and Sexual Activity  . Alcohol use: No  . Drug use: Not on file  . Sexual activity: Not on file  Other Topics Concern  . Not on file  Social History Narrative  . Not on file   Social Determinants of Health   Financial Resource Strain:   . Difficulty of Paying Living Expenses:   Food Insecurity:   . Worried About Charity fundraiser in the Last Year:   . Arboriculturist in the Last Year:   Transportation Needs:   . Film/video editor (Medical):   Marland Kitchen Lack of Transportation (Non-Medical):  Physical Activity:   . Days of Exercise per Week:   . Minutes of Exercise per Session:   Stress:   . Feeling of Stress :   Social Connections:   . Frequency of Communication with Friends and Family:   . Frequency of Social Gatherings with Friends and Family:   . Attends Religious Services:   . Active Member of Clubs or Organizations:   . Attends Archivist Meetings:   Marland Kitchen Marital Status:   Intimate Partner Violence:   . Fear of Current  or Ex-Partner:   . Emotionally Abused:   Marland Kitchen Physically Abused:   . Sexually Abused:     FAMILY HISTORY: Family History  Problem Relation Age of Onset  . Stroke Mother     ALLERGIES:  has No Known Allergies.  MEDICATIONS:  Current Outpatient Medications  Medication Sig Dispense Refill  . acetaminophen (TYLENOL) 325 MG tablet Take 650 mg by mouth every 6 (six) hours as needed for pain.    Marland Kitchen enoxaparin (LOVENOX) 40 MG/0.4ML injection Inject 40 mg into the skin daily.    . ferrous sulfate 325 (65 FE) MG tablet Take 1 tablet (325 mg total) by mouth daily with breakfast. 90 tablet 1  . MELATONIN PO Take 1 capsule by mouth at bedtime as needed (sleep).     . metroNIDAZOLE (METROGEL) 0.75 % gel Apply 1 application topically daily as needed (rosacea).     . Oxycodone HCl 20 MG TABS Take 1-1.5 tablets (20-30 mg total) by mouth See admin instructions. Take 1 tablet in the morning, take 1.5 tablets at lunch, and take 1 tablet at night 150 tablet 0   No current facility-administered medications for this visit.    REVIEW OF SYSTEMS:   Constitutional: ( - ) fevers, ( - )  chills , ( - ) night sweats Eyes: ( - ) blurriness of vision, ( - ) double vision, ( - ) watery eyes Ears, nose, mouth, throat, and face: ( - ) mucositis, ( - ) sore throat Respiratory: ( - ) cough, ( - ) dyspnea, ( - ) wheezes Cardiovascular: ( - ) palpitation, ( - ) chest discomfort, ( - ) lower extremity swelling Gastrointestinal:  ( - ) nausea, ( - ) heartburn, ( - ) change in bowel habits Skin: ( - ) abnormal skin rashes Lymphatics: ( - ) new lymphadenopathy, ( - ) easy bruising Neurological: ( - ) numbness, ( - ) tingling, ( - ) new weaknesses Behavioral/Psych: ( - ) mood change, ( - ) new changes  All other systems were reviewed with the patient and are negative.  PHYSICAL EXAMINATION: ECOG PERFORMANCE STATUS: 4 - Bedbound  Vitals:   04/25/19 1448  BP: 92/66  Pulse: (!) 131  Resp: 18  Temp: 99.2 F (37.3 C)    SpO2: 100%   GENERAL: chronically ill appearing middle aged [place] American male in a wheelchair. alert, no distress and comfortable SKIN: skin color, texture, turgor are normal, no rashes or significant lesions EYES: conjunctiva are pink and non-injected, sclera clear LUNGS: clear to auscultation and percussion with normal breathing effort HEART: tachycardic with no murmurs and no lower extremity edema ABDOMEN:ostomy in the left of the abdomen, semi formed brown stool present.  WOUND: did not view today in clinic. Malodorous.  Musculoskeletal: no cyanosis of digits and no clubbing  PSYCH: alert & oriented x 3, fluent speech NEURO: no focal motor/sensory deficits  LABORATORY DATA:  I have reviewed the data as listed  CMP Latest Ref Rng & Units 03/14/2019 03/13/2019 03/12/2019  Glucose 70 - 99 mg/dL 130(H) 100(H) 88  BUN 6 - 20 mg/dL 9 6 9   Creatinine 0.61 - 1.24 mg/dL 0.65 0.59(L) 0.53(L)  Sodium 135 - 145 mmol/L 134(L) 135 135  Potassium 3.5 - 5.1 mmol/L 3.8 4.0 3.8  Chloride 98 - 111 mmol/L 99 101 103  CO2 22 - 32 mmol/L 29 28 26   Calcium 8.9 - 10.3 mg/dL 8.9 8.5(L) 8.4(L)  Total Protein 6.5 - 8.1 g/dL 7.2 6.6 6.5  Total Bilirubin 0.3 - 1.2 mg/dL 0.4 0.3 0.1(L)  Alkaline Phos 38 - 126 U/L 65 67 71  AST 15 - 41 U/L 12(L) 7(L) 8(L)  ALT 0 - 44 U/L 13 14 16    CBC Latest Ref Rng & Units 04/25/2019 03/14/2019 03/13/2019  WBC 4.0 - 10.5 K/uL 15.3(H) 9.5 8.5  Hemoglobin 13.0 - 17.0 g/dL 7.4(L) 9.0(L) 7.8(L)  Hematocrit 39.0 - 52.0 % 24.5(L) 29.5(L) 26.3(L)  Platelets 150 - 400 K/uL 731(H) 518(H) 442(H)   Iron/TIBC/Ferritin/ %Sat    Component Value Date/Time   IRON 21 (L) 03/09/2019 2003   TIBC 174 (L) 03/09/2019 2003   FERRITIN 33 03/09/2019 2003   IRONPCTSAT 12 (L) 03/09/2019 2003     RADIOGRAPHIC STUDIES:  NM PET Image Initial (PI) Skull Base To Thigh    CLINICAL DATA:  Subsequent treatment strategy for anal cancer.  EXAM: NUCLEAR MEDICINE PET SKULL BASE TO  THIGH  TECHNIQUE: 6.9 mCi F-18 FDG was injected intravenously. Full-ring PET imaging was performed from the skull base to thigh after the radiotracer. CT data was obtained and used for attenuation correction and anatomic localization.  Fasting blood glucose: 98 mg/dl  COMPARISON:  The 03/09/2019  FINDINGS: Mediastinal blood pool activity: SUV max 2.75  Liver activity: SUV max NA  NECK: No hypermetabolic lymph nodes in the neck.  Incidental CT findings: none  CHEST: Multiple bilateral pulmonary nodules are identified. Many of these nodules are less than 1 cm and does too small to reliably characterize. Additionally, this is compound did by considerable misregistration artifact. For these reasons anatomic correlation of abnormal uptake within the lungs is unreliable.  From the PET images:  - left upper lobe perihilar focus of increased uptake which has an SUV max of 3.4.  -within the anterior right upper lobe there is a small focus of increased uptake within SUV max of 2.19.  -Within the lateral right upper lobe there is a focus of increased uptake within SUV max of 2.3.  -Posterior right lower lobe focus of increased uptake has an SUV max of 2.25.  On the CT images multiple pulmonary nodules are identified throughout both lungs. These are all new when compared to the PET-CT from 04/27/2010. Further, as mentioned on study from 03/09/2019 the lower lung zone nodules which have been followed on serial CTs of the abdomen and pelvis have increased in size when compared with prior studies, including:  - right base nodule which measures 0.8 cm, image 51/8. On 02/04/2019 this nodule measured 0.6 cm.  - right middle lobe lung nodule measures 0.6 cm, image 56/8. This is compared with 0.4 cm on 02/04/2019.  - left lower lobe lung nodule measures 4 mm, image 56/8. New from 02/04/2019.  Incidental CT findings: none  ABDOMEN/PELVIS: Band of photopenia  extending across the lower chest and upper abdomen is a result of misregistration and motion artifact and diminishes ability to evaluate for FDG avid liver metastases. As mentioned on 03/09/2019  there are no suspicious liver lesions noted on the CT images. No definite abnormal uptake identified in the expected location of the pancreas, spleen or adrenal glands.  There is extensive FDG avid tumor within the lower pelvis. Marked increased soft tissue thickening along bilateral pelvic sidewalls are identified with associated intense FDG uptake. On the left pelvic sidewall tumor measures 2.9 cm in thickness and has an SUV max of 6.75. On the right tumor measures 2 cm in thickness and has SUV max of 7.7 FDG avid tumor extends through the perineum and base of penis. Here, mass measures 5.6 by 7.0 cm and has an SUV max of 10.17. Hypermetabolic tumor extends posteriorly to involve bilateral gluteal clefts where there is corresponding skin thickening and increased uptake with SUV max of 5.05. Posterior extension of tumor is also noted into the presacral soft tissue space.  Incidental CT findings: none  SKELETON: On the left inferior pubic rami tumor has an SUV max of 6.48. On the right tumor has an SUV max of 10.17. Focal area of increased uptake within the left gluteal musculature is noted within SUV max of 5.89.  Incidental CT findings: none  IMPRESSION: 1. This is a technically difficult exam to interpret due to considerable motion artifact resulting in areas of considerable misregistration within the neck and chest. Additionally, there are areas within the lower chest and abdomen where no PET-CT data was recorded. 2. Within the limitations described above there is extensive FDG avid tumor within the lower pelvis involving bilateral pelvic sidewalls and perineum. 3. Tumor extension beyond the pelvic sidewall is noted with involvement of bilateral pubic rami and surrounding  musculature. Corresponding erosive changes are noted involving bilateral inferior pubic rami. 4. Multiple bilateral pulmonary nodules are identified concerning for metastatic disease.   Electronically Signed   By: Kerby Moors M.D.   On: 03/22/2019 14:53  ASSESSMENT & PLAN Avyon L Chandra 49 y.o. male with medical history significant for non-Hodgkin lymphoma (treated in 1999 with chemo alone) who presents for follow up of metastatic anal cancer. His course has been markedly complicated and handled at a multitude of different institutions.   Dylan [name] was admitted to [facility] long hospital from 03/09/2019-03/14/2019 for weakness and hypotension, thought to be due to sepsis. He has recovered well and completed a course PO antibiotics at discharge. At this time his clinical picture is stable, similar to prior. He is still not a candidate for systemic therapy.   During prior hospitalizations he was evaluated by surgery who determined that his wound required no further surgical intervention.  This is similar to the viewpoint shared by the surgeons at [facility].  Given this it is apparent that we will not likely be able to get good control of the bleeding that he is having from this wound.  As such his hemoglobin will not be able to improve to normal levels and treatment will be markedly difficult.  I am also concerned that in the event he received treatment with immunotherapy his immune system would be weakened and he would get infection of the open wound.  Furthermore it is unlikely that there would be much utility in the use of immunotherapy given his markedly poor functional status.  I noted to the patient that if we were able to improve his blood counts energy level and mobility that immunotherapy would be reconsidered.  I noted to him that in his current state immunotherapy would not be a viable option.  He voiced his  understanding of these findings.  I did request that we begin IV  iron as his p.o. iron supplementation does not appear to be increasing his iron levels.  He declined to start IV iron, noting that he "has enough going on right now".   #Metastatic Anal Cancer --last PET CT scan on 03/22/2019 showed extensive FDG avid tumor within the lower pelvis involving bilateral pelvic sidewalls and perineum. Additionally there were multiple bilateral pulmonary nodules are identified concerning for metastatic disease. -- will optimize patient for possible immunotherapy treatment by replenishing his iron stores and increasing his blood counts.  --due to lightheadedness/weakness will plan for blood transfusion and IV bolus of fluids tomorrow.  --RTC in 4 weeks to reassess   #Iron Deficiency Anemia Secondary to Blood Loss --patient declined to start IV therapy, though this was offered as his PO iron supplementation appears to be inadequate. He reports continued use of the PO iron pills.  --prior iron panel shows undetectable iron and iron sats. His ferritin was elevated at 92, likely falsely elevated in the setting of inflammation from the wound.  --patient will require transfusion if Hgb drops below 7.0. Continue to monitor at each subsequent visit. Hgb >7.0 today, but his anemia is symptomatic  #Symptom Management --patient currently has a weekly visit from palliative care and two weekly visits from home health nursing. --continue oxycodone 20mg -30mg -20mg  TID dosing --continue to monitor  All questions were answered. The patient knows to call the clinic with any problems, questions or concerns.  A total of more than 30 minutes were spent face-to-face with the patient during this encounter and over half of that time was spent on counseling and coordination of care as outlined above.   Dylan Peoples, MD Department of Hematology/Oncology [place] at [facility] Phone: [phone] Pager: [phone] Email:  [email or web address]  04/25/2019 3:16 PM

## 2019-04-25 NOTE — Telephone Encounter (Signed)
Opened in error

## 2019-04-26 ENCOUNTER — Other Ambulatory Visit: Payer: Self-pay | Admitting: *Deleted

## 2019-04-26 ENCOUNTER — Inpatient Hospital Stay: Payer: Medicare Other

## 2019-04-26 ENCOUNTER — Telehealth: Payer: Self-pay | Admitting: General Practice

## 2019-04-26 ENCOUNTER — Telehealth: Payer: Self-pay | Admitting: Hematology and Oncology

## 2019-04-26 ENCOUNTER — Telehealth: Payer: Self-pay | Admitting: *Deleted

## 2019-04-26 ENCOUNTER — Other Ambulatory Visit: Payer: Self-pay

## 2019-04-26 DIAGNOSIS — C210 Malignant neoplasm of anus, unspecified: Secondary | ICD-10-CM

## 2019-04-26 DIAGNOSIS — D500 Iron deficiency anemia secondary to blood loss (chronic): Secondary | ICD-10-CM | POA: Diagnosis not present

## 2019-04-26 DIAGNOSIS — E861 Hypovolemia: Secondary | ICD-10-CM

## 2019-04-26 DIAGNOSIS — I9589 Other hypotension: Secondary | ICD-10-CM

## 2019-04-26 DIAGNOSIS — R000 Tachycardia, unspecified: Secondary | ICD-10-CM

## 2019-04-26 LAB — COMPREHENSIVE METABOLIC PANEL
ALT: 15 IU/L (ref 0–44)
AST: 14 IU/L (ref 0–40)
Albumin/Globulin Ratio: 0.6 — ABNORMAL LOW (ref 1.2–2.2)
Albumin: 3 g/dL — ABNORMAL LOW (ref 4.0–5.0)
Alkaline Phosphatase: 137 IU/L — ABNORMAL HIGH (ref 39–117)
BUN/Creatinine Ratio: 9 (ref 9–20)
BUN: 7 mg/dL (ref 6–24)
Bilirubin Total: <0.2 mg/dL (ref 0.0–1.2)
CO2: 19 mmol/L — ABNORMAL LOW (ref 20–29)
Calcium: 10 mg/dL (ref 8.7–10.2)
Chloride: 95 mmol/L — ABNORMAL LOW (ref 96–106)
Creatinine, Ser: 0.79 mg/dL (ref 0.76–1.27)
GFR calc Af Amer: 121 mL/min/{1.73_m2} (ref >59)
GFR calc non Af Amer: 105 mL/min/{1.73_m2} (ref >59)
Globulin, Total: 4.8 g/dL — ABNORMAL HIGH (ref 1.5–4.5)
Glucose: 91 mg/dL (ref 65–99)
Potassium: 4.4 mmol/L (ref 3.5–5.2)
Sodium: 130 mmol/L — ABNORMAL LOW (ref 134–144)
Total Protein: 7.8 g/dL (ref 6.0–8.5)

## 2019-04-26 LAB — CBC
Hematocrit: 27.6 % — ABNORMAL LOW (ref 37.5–51.0)
Hemoglobin: 7.9 g/dL — ABNORMAL LOW (ref 13.0–17.7)
MCH: 20.1 pg — ABNORMAL LOW (ref 26.6–33.0)
MCHC: 28.6 g/dL — ABNORMAL LOW (ref 31.5–35.7)
MCV: 70 fL — ABNORMAL LOW (ref 79–97)
Platelets: 682 10*3/uL — ABNORMAL HIGH (ref 150–450)
RBC: 3.93 x10E6/uL — ABNORMAL LOW (ref 4.14–5.80)
RDW: 18.5 % — ABNORMAL HIGH (ref 11.6–15.4)
WBC: 14.3 10*3/uL — ABNORMAL HIGH (ref 3.4–10.8)

## 2019-04-26 MED ORDER — ACETAMINOPHEN 325 MG PO TABS
650.0000 mg | ORAL_TABLET | Freq: Once | ORAL | Status: AC
  Administered 2019-04-26: 650 mg via ORAL

## 2019-04-26 MED ORDER — SODIUM CHLORIDE 0.9 % IV SOLN
Freq: Once | INTRAVENOUS | Status: AC
  Filled 2019-04-26: qty 250

## 2019-04-26 MED ORDER — ACETAMINOPHEN 325 MG PO TABS
ORAL_TABLET | ORAL | Status: AC
  Filled 2019-04-26: qty 2

## 2019-04-26 MED ORDER — SODIUM CHLORIDE 0.9% IV SOLUTION
250.0000 mL | Freq: Once | INTRAVENOUS | Status: AC
  Administered 2019-04-26: 250 mL via INTRAVENOUS
  Filled 2019-04-26: qty 250

## 2019-04-26 NOTE — Telephone Encounter (Signed)
[place] CSW Progress Notes  Referral received, requested to assist w [facility] referral.  Called patient to confirm, unable to reach.  Asked CSW Dalene Seltzer to follow up on Monday w physician and desk RN.  Edwyna Shell, LCSW Clinical Social Worker Phone:  [phone] Cell:  [phone]

## 2019-04-26 NOTE — Telephone Encounter (Signed)
Scheduled per los. Called and spoke with patient. Confirmed appt 

## 2019-04-26 NOTE — Progress Notes (Signed)
This patient had a unit of blood started by myself and Malva Cogan RN at 1212 today, we scanned it and did our check offs and put in a rate of 120 ml/hr but nothing transcribed to the [place] notified by 1235 and a ticket was placed by the tech when she was not able to help me rectify it. The blood is transfusing without any issues outside of this tech issue with the rates not populating, which will not allow [place] to calculate the volume of blood given later.

## 2019-04-26 NOTE — Telephone Encounter (Signed)
SW and dietary referrals made

## 2019-04-26 NOTE — Patient Instructions (Signed)

## 2019-04-28 LAB — BPAM RBC
Blood Product Expiration Date: 202104112359
ISSUE DATE / TIME: 202103121156
Unit Type and Rh: 5100

## 2019-04-28 LAB — TYPE AND SCREEN
ABO/RH(D): O POS
Antibody Screen: NEGATIVE
Unit division: 0

## 2019-04-29 ENCOUNTER — Encounter: Payer: Self-pay | Admitting: *Deleted

## 2019-04-29 ENCOUNTER — Telehealth: Payer: Self-pay | Admitting: Hematology and Oncology

## 2019-04-29 NOTE — Telephone Encounter (Signed)
Scheduled nutrition appt per 3/12 sch msg. Called and spoke with patient. Confirmed appt

## 2019-04-29 NOTE — Progress Notes (Signed)
[facility] Clinical Social Work  [facility] representative contacted patient at home to confirm request for [employer].  Patient did confirm the need and request for PCS.  CSW gave PCS forms to RN to complete medical information.  Once complete RN will contact CSW prior to faxing forms to [place].   Johnnye Lana, MSW, LCSW, OSW-C Clinical Social Worker [facility] [phone]

## 2019-05-01 ENCOUNTER — Encounter: Payer: Self-pay | Admitting: General Practice

## 2019-05-01 NOTE — Progress Notes (Signed)
[facility] [phone]).  Confirmed that agency has received the [place] request - they are processing it and will reach out to patient to schedule an assessment for services.  No further information needed from [facility] at this time.  Patient informed by phone.  Edwyna Shell, LCSW Clinical Social Worker Phone:  [phone]

## 2019-05-02 ENCOUNTER — Other Ambulatory Visit: Payer: Self-pay

## 2019-05-02 ENCOUNTER — Other Ambulatory Visit: Payer: Medicare Other | Admitting: Internal Medicine

## 2019-05-04 NOTE — Progress Notes (Unsigned)
Appointment scheduled in error; patient not seen. No charge. Violeta Gelinas NP-C [phone]

## 2019-05-06 ENCOUNTER — Encounter: Payer: Self-pay | Admitting: Nutrition

## 2019-05-06 ENCOUNTER — Other Ambulatory Visit: Payer: Self-pay | Admitting: Family Medicine

## 2019-05-06 ENCOUNTER — Inpatient Hospital Stay: Payer: Medicare Other | Admitting: Nutrition

## 2019-05-06 DIAGNOSIS — C210 Malignant neoplasm of anus, unspecified: Secondary | ICD-10-CM

## 2019-05-06 DIAGNOSIS — G893 Neoplasm related pain (acute) (chronic): Secondary | ICD-10-CM

## 2019-05-06 MED ORDER — OXYCODONE HCL 20 MG PO TABS: 20.0000 mg | ORAL_TABLET | ORAL | 0 refills | Status: DC

## 2019-05-06 NOTE — Telephone Encounter (Signed)
TeleMed appointment March 1st.  At that time symptoms were stable with oxycodone 20 mg in the morning, 30 mg in the afternoon, 20 mg at night. Has required additional pill at night at times.   Refilled February 15, #150 filled on February 21 of 20 mg. Controlled substance database (PDMP) reviewed.  Refill placed. Printed as patient in office to pick up refill (initial Rx shredded as wrong fill date).

## 2019-05-06 NOTE — Progress Notes (Signed)
Patient did not show up for nutrition appointment. 

## 2019-05-06 NOTE — Telephone Encounter (Signed)
Requested medication (s) are due for refill today: Oxycodone, yes  Requested medication (s) are on the active medication list: yes  Last refill: 04/01/19  Future visit scheduled: yes  Notes to clinic:  not delegated  Requested medication (s) are due for refill today: Midodrine, no  Requested medication (s) are on the active medication list: no  Last refill:  ?  Future visit scheduled: yes  Notes to clinic:  not on active med list; original script per Dr Tawanna Solo  Requested Prescriptions  Pending Prescriptions Disp Refills   Oxycodone HCl 20 MG TABS 150 tablet 0    Sig: Take 1-1.5 tablets (20-30 mg total) by mouth See admin instructions. Take 1 tablet in the morning, take 1.5 tablets at lunch, and take 1 tablet at night      Not Delegated - Analgesics:  Opioid Agonists Failed - 05/06/2019  9:42 AM      Failed - This refill cannot be delegated      Failed - Urine Drug Screen completed in last 360 days.      Passed - Valid encounter within last 6 months    Recent Outpatient Visits           3 weeks ago Cancer related pain   [facility], Ranell Patrick, MD   1 month ago Squamous cell cancer, anus [facility])   [facility], Ranell Patrick, MD   1 month ago Iron deficiency anemia due to chronic blood loss   [facility], Ranell Patrick, MD   6 months ago Tachycardia   [facility], Ranell Patrick, MD   6 months ago Hypotension, unspecified hypotension type   [facility], Ranell Patrick, MD       Future Appointments             In 2 weeks Carlota Raspberry Ranell Patrick, MD [facility] [place], [facility]

## 2019-05-06 NOTE — Telephone Encounter (Signed)
Patient is requesting a refill of the following medications: Requested Prescriptions   Pending Prescriptions Disp Refills  . Oxycodone HCl 20 MG TABS 150 tablet 0    Sig: Take 1-1.5 tablets (20-30 mg total) by mouth See admin instructions. Take 1 tablet in the morning, take 1.5 tablets at lunch, and take 1 tablet at night     Requested medication (s) are due for refill today: Oxycodone yes  Requested medication (s) are on the active medication list: yes  Last refill: 04/01/19  Future visit scheduled: yes  Notes to clinic:  not delegated Future visit scheduled: yes

## 2019-05-06 NOTE — Telephone Encounter (Signed)
Copied from [place] [id number]. Topic: Quick Communication - Rx Refill/Question >> May 06, 2019  9:30 AM Leward Quan A wrote: Medication: Oxycodone HCl 20 MG TABS, midodrine (proamatine) 10 mg tablet  Has the patient contacted their pharmacy? Yes.   (Agent: If no, request that the patient contact the pharmacy for the refill.) (Agent: If yes, when and what did the pharmacy advise?)  Preferred Pharmacy (with phone number or street name): [id number] [address], [place]  Phone:  [phone] Fax:  [phone]     Agent: Please be advised that RX refills may take up to 3 business days. We ask that you follow-up with your pharmacy.

## 2019-05-07 ENCOUNTER — Telehealth: Payer: Self-pay | Admitting: *Deleted

## 2019-05-07 NOTE — Telephone Encounter (Signed)
Received call from [facility] with [facility].  They have attempted to reach the patient to start services but have been unable to reach him.    Called him directly and was able to reach him.  I advised him to be on the look out for a [phone] number to be calling him and to answer.

## 2019-05-20 ENCOUNTER — Other Ambulatory Visit: Payer: Self-pay

## 2019-05-20 ENCOUNTER — Encounter: Payer: Self-pay | Admitting: Family Medicine

## 2019-05-20 ENCOUNTER — Telehealth (INDEPENDENT_AMBULATORY_CARE_PROVIDER_SITE_OTHER): Payer: Medicare Other | Admitting: Family Medicine

## 2019-05-20 VITALS — Ht 73.0 in | Wt 130.0 lb

## 2019-05-20 DIAGNOSIS — Z720 Tobacco use: Secondary | ICD-10-CM | POA: Diagnosis not present

## 2019-05-20 DIAGNOSIS — C210 Malignant neoplasm of anus, unspecified: Secondary | ICD-10-CM | POA: Diagnosis not present

## 2019-05-20 DIAGNOSIS — G893 Neoplasm related pain (acute) (chronic): Secondary | ICD-10-CM | POA: Diagnosis not present

## 2019-05-20 DIAGNOSIS — E871 Hypo-osmolality and hyponatremia: Secondary | ICD-10-CM

## 2019-05-20 DIAGNOSIS — I959 Hypotension, unspecified: Secondary | ICD-10-CM

## 2019-05-20 DIAGNOSIS — D500 Iron deficiency anemia secondary to blood loss (chronic): Secondary | ICD-10-CM | POA: Diagnosis not present

## 2019-05-20 DIAGNOSIS — E440 Moderate protein-calorie malnutrition: Secondary | ICD-10-CM

## 2019-05-20 MED ORDER — MIDODRINE HCL 5 MG PO TABS: 5.0000 mg | ORAL_TABLET | Freq: Three times a day (TID) | ORAL | 0 refills | Status: DC

## 2019-05-20 NOTE — Patient Instructions (Signed)
° ° ° °  If you have lab work done today you will be contacted with your lab results within the next 2 weeks.  If you have not heard from us then please contact us. The fastest way to get your results is to register for My Chart. ° ° °IF you received an x-ray today, you will receive an invoice from [facility]. Please contact [facility] at [phone] with questions or concerns regarding your invoice.  ° °IF you received labwork today, you will receive an invoice from [facility]. Please contact [facility] at [phone] with questions or concerns regarding your invoice.  ° °Our billing staff will not be able to assist you with questions regarding bills from these companies. ° °You will be contacted with the lab results as soon as they are available. The fastest way to get your results is to activate your My Chart account. Instructions are located on the last page of this paperwork. If you have not heard from us regarding the results in 2 weeks, please contact this office. °  ° ° ° °

## 2019-05-20 NOTE — Progress Notes (Signed)
Virtual Visit via Video Note  I connected with Dylan [name] on 05/20/19 at 1:22 PM by a video enabled telemedicine application [employer] and verified that I am speaking with the correct person using two identifiers.   I discussed the limitations, risks, security and privacy concerns of performing an evaluation and management service by telephone and the availability of in person appointments. I also discussed with the patient that there may be a patient responsible charge related to this service. The patient expressed understanding and agreed to proceed, consent obtained  Chief complaint:  Chief Complaint  Patient presents with  . Follow-up    Follow-up on cancer related pain. pt states no change to pain since last visit.     History of Present Illness: Dylan [name] is a 49 y.o. male  Squamous cell cancer of the anus with chronic pain. Metastatic squamous cell anal cancer, oncology Dr. Lorenso Courier.  PET CT scan in February is showing extensive FDG avid tumor within the lower pelvis involving bilateral pelvic sidewalls and perineum and multiple bilateral pulmonary nodules.  Plan for replenishment of iron stores and increasing blood counts for possible immunotherapy.  Blood transfusion on March 12 due to lightheadedness, weakness, tachycardia.  IV bolus of fluids also planned.  Iron deficiency anemia secondary to blood loss, IV iron therapy had been recommended but declined.  Plan for transfusions if hemoglobin below 7. appt with oncology 4/15.  Pain control with oxycodone 20mg , 30mg , 20mg  tid. Takes additional dose in middle of night for pain - 4.5 pills per day.  Has been working well for pain control. Has tried 20mg  in middle of day - has breakthrough pain. No hard stools. Has home health for nursing/wound care - 2 times per week. Bleeding about the same from wound.  Denies new lightheadedness/dizziness.  Midodrine for maintenance of BP 5mg  tid. Ran out 1 week ago. Needs refill.  Home BP 110/?    Controlled substance database (PDMP) reviewed. No concerns appreciated.  Last filled oxycodone 20mg  #150 on 3/22.   BP Readings from Last 3 Encounters:  04/26/19 98/71  04/25/19 92/66  03/14/19 105/68   Ensure shake 1-2 per day. 3 meals per day.  Albumin 2.3 in January, 3.0 on  3/11.   Lab Results  Component Value Date   WBC 14.3 (H) 04/25/2019   HGB 7.9 (L) 04/25/2019   HCT 27.6 (L) 04/25/2019   MCV 70 (L) 04/25/2019   PLT 682 (H) 04/25/2019      Patient Active Problem List   Diagnosis Date Noted  . Pressure injury of skin 03/11/2019  . Severe sepsis ([place]) 03/09/2019  . Wound discharge 03/09/2019  . Cancer cachexia ([place]) 02/06/2019  . Perforation of rectum from anal cancer s/p diverting colostomy 01/16/2019 02/06/2019  . Anal cellulitis 02/05/2019  . Septic shock ([place]) 02/05/2019  . 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 Colostomy - diverting loop colostomy in place Dec 2020 02/05/2019  . Suprapubic tube in place Dec 2020 for rectourethral fistula 02/05/2019  . Sepsis due to undetermined organism ([place]) 02/04/2019  . Microcytic anemia 02/04/2019  . Sepsis secondary to UTI ([place]) 02/04/2019  . Malnutrition of moderate degree 10/20/2018  . Hypotension 10/18/2018  . Tachycardia 10/18/2018  . Anemia 10/18/2018  . Iron deficiency 10/18/2018  . Sepsis ([place]) 10/18/2018  . Cancer associated pain 02/23/2017  . Metastatic anal cancer 02/10/2014   Past Medical History:  Diagnosis Date  . Cancer ([place]) 2018   anal cancer  . Non  Hodgkin's lymphoma ([place]) 1999  . S/P radiation therapy 07/07/14-7/20/216   anal ca    Past Surgical History:  Procedure Laterality Date  . COLOSTOMY  2020   diverting colostomy at [facility]  . ILEOSTOMY  2020   diverting colostomy, [employer]   No Known Allergies Prior to Admission medications   Medication Sig Start Date End Date Taking? Authorizing Provider  acetaminophen (TYLENOL) 325 MG tablet Take 650 mg by mouth  every 6 (six) hours as needed for pain. 11/21/18  Yes [provider]  enoxaparin (LOVENOX) 40 MG/0.4ML injection Inject 40 mg into the skin daily. 01/19/19  Yes [provider]  MELATONIN PO Take 1 capsule by mouth at bedtime as needed (sleep).    Yes [provider]  metroNIDAZOLE (METROGEL) 0.75 % gel Apply 1 application topically daily as needed (rosacea).  11/21/18  Yes [provider]  Oxycodone HCl 20 MG TABS Take 1-1.5 tablets (20-30 mg total) by mouth See admin instructions. Take 1 tablet in the morning, take 1.5 tablets at lunch, and take 1 tablet at night 05/06/19  Yes [name], MD  ferrous sulfate 325 (65 FE) MG tablet Take 1 tablet (325 mg total) by mouth daily with breakfast. 04/15/19 05/15/19  [name], MD   Social History   Socioeconomic History  . Marital status: Single    Spouse name: Not on file  . Number of children: Not on file  . Years of education: Not on file  . Highest education level: Not on file  Occupational History  . Not on file  Tobacco Use  . Smoking status: Current Some Day Smoker    Packs/day: 0.30    Types: Cigarettes  . Smokeless tobacco: Current User  Substance and Sexual Activity  . Alcohol use: No  . Drug use: Not on file  . Sexual activity: Not on file  Other Topics Concern  . Not on file  Social History Narrative  . Not on file   Social Determinants of Health   Financial Resource Strain:   . Difficulty of Paying Living Expenses:   Food Insecurity:   . Worried About Charity fundraiser in the Last Year:   . Arboriculturist in the Last Year:   Transportation Needs:   . Film/video editor (Medical):   Marland Kitchen Lack of Transportation (Non-Medical):   Physical Activity:   . Days of Exercise per Week:   . Minutes of Exercise per Session:   Stress:   . Feeling of Stress :   Social Connections:   . Frequency of Communication with Friends and Family:   . Frequency of Social Gatherings with  Friends and Family:   . Attends Religious Services:   . Active Member of Clubs or Organizations:   . Attends Archivist Meetings:   Marland Kitchen Marital Status:   Intimate Partner Violence:   . Fear of Current or Ex-Partner:   . Emotionally Abused:   Marland Kitchen Physically Abused:   . Sexually Abused:     Observations/Objective: Vitals:   05/20/19 1214  Weight: 130 lb (59 kg)  Height: 6\' 1"  (1.854 m)   No distress on video. Appropriate responses. All questions answered and understanding of plan expressed.   Assessment and Plan: Iron deficiency anemia due to chronic blood loss Squamous cell cancer, anus (HCC) Cancer related pain  -Denies new symptoms of worsening anemia.  Lab only visit planned early next week so those results will be available at his  upcoming oncology appointment continue iron supplementation.  -Pain controlled with current regimen, no refill needed at this time.  Hypotension, unspecified hypotension type  -Borderline low readings at home, restart midodrine.  Moderate malnutrition ([place])  -Commended on diet, Ensure supplementation.  Albumin levels increasing.  Hyponatremia  -Repeat labs with lab only visit next week.  Follow Up Instructions: Patient Instructions       If you have lab work done today you will be contacted with your lab results within the next 2 weeks.  If you have not heard from [place] then please contact us. The fastest way to get your results is to register for My Chart.   IF you received an x-ray today, you will receive an invoice from [facility]. Please contact [facility] at [phone] with questions or concerns regarding your invoice.   IF you received labwork today, you will receive an invoice from [place]. Please contact [facility] at [phone] with questions or concerns regarding your invoice.   Our billing staff will not be able to assist you with questions regarding bills from these companies.  You will be  contacted with the lab results as soon as they are available. The fastest way to get your results is to activate your My Chart account. Instructions are located on the last page of this paperwork. If you have not heard from [place] regarding the results in 2 weeks, please contact this office.         I discussed the assessment and treatment plan with the patient. The patient was provided an opportunity to ask questions and all were answered. The patient agreed with the plan and demonstrated an understanding of the instructions.   The patient was advised to call back or seek an in-person evaluation if the symptoms worsen or if the condition fails to improve as anticipated.  I provided 19 minutes of non-face-to-face time during this encounter.   [name], MD

## 2019-05-21 ENCOUNTER — Other Ambulatory Visit: Payer: Self-pay

## 2019-05-21 ENCOUNTER — Emergency Department (HOSPITAL_COMMUNITY)
Admission: EM | Admit: 2019-05-21 | Discharge: 2019-05-21 | Disposition: A | Discharge status: Home / Self Care | Payer: Medicare Other | Attending: Emergency Medicine | Admitting: Emergency Medicine

## 2019-05-21 ENCOUNTER — Encounter (HOSPITAL_COMMUNITY): Payer: Self-pay | Admitting: *Deleted

## 2019-05-21 DIAGNOSIS — Z8572 Personal history of non-Hodgkin lymphomas: Secondary | ICD-10-CM | POA: Insufficient documentation

## 2019-05-21 DIAGNOSIS — T83028S Displacement of other indwelling urethral catheter, sequela: Secondary | ICD-10-CM | POA: Diagnosis present

## 2019-05-21 DIAGNOSIS — T83010S Breakdown (mechanical) of cystostomy catheter, sequela: Secondary | ICD-10-CM

## 2019-05-21 DIAGNOSIS — Z79899 Other long term (current) drug therapy: Secondary | ICD-10-CM | POA: Insufficient documentation

## 2019-05-21 DIAGNOSIS — F17210 Nicotine dependence, cigarettes, uncomplicated: Secondary | ICD-10-CM | POA: Diagnosis not present

## 2019-05-21 DIAGNOSIS — Y732 Prosthetic and other implants, materials and accessory gastroenterology and urology devices associated with adverse incidents: Secondary | ICD-10-CM | POA: Insufficient documentation

## 2019-05-21 DIAGNOSIS — Z85048 Personal history of other malignant neoplasm of rectum, rectosigmoid junction, and anus: Secondary | ICD-10-CM | POA: Diagnosis not present

## 2019-05-21 MED ORDER — LIDOCAINE HCL URETHRAL/MUCOSAL 2 % EX GEL
1.0000 "application " | Freq: Once | CUTANEOUS | Status: AC
  Administered 2019-05-21: 1
  Filled 2019-05-21: qty 30

## 2019-05-21 NOTE — Discharge Instructions (Addendum)
Call Urology to schedule suprapubic catheter changes

## 2019-05-21 NOTE — ED Provider Notes (Signed)
[place] DEPT Provider Note   CSN: [id number] Arrival date & time: 05/21/19  1252     History Chief Complaint  Patient presents with  . Cath needs replacing    Dylan [name] is a 49 y.o. male.  Pt reports Home health nurse was changing his suprapubic catheter and new catheter would not go in.  Pt has a history of anal cancer and had a suprapubic cath placed placed at [facility] in December.  Pt has metastatic cancer. Pt reports home health is cahnging catheter every month  The history is provided by the patient. No language interpreter was used.       Past Medical History:  Diagnosis Date  . Cancer ([place]) 2018   anal cancer  . Non Hodgkin's lymphoma ([place]) 1999  . S/P radiation therapy 07/07/14-7/20/216   anal ca     Patient Active Problem List   Diagnosis Date Noted  . Pressure injury of skin 03/11/2019  . Severe sepsis ([place]) 03/09/2019  . Wound discharge 03/09/2019  . Cancer cachexia ([place]) 02/06/2019  . Perforation of rectum from anal cancer s/p diverting colostomy 01/16/2019 02/06/2019  . Anal cellulitis 02/05/2019  . Septic shock ([place]) 02/05/2019  . 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 Colostomy - diverting loop colostomy in place Dec 2020 02/05/2019  . Suprapubic tube in place Dec 2020 for rectourethral fistula 02/05/2019  . Sepsis due to undetermined organism ([place]) 02/04/2019  . Microcytic anemia 02/04/2019  . Sepsis secondary to UTI ([place]) 02/04/2019  . Malnutrition of moderate degree 10/20/2018  . Hypotension 10/18/2018  . Tachycardia 10/18/2018  . Anemia 10/18/2018  . Iron deficiency 10/18/2018  . Sepsis ([place]) 10/18/2018  . Cancer associated pain 02/23/2017  . Metastatic anal cancer 02/10/2014    Past Surgical History:  Procedure Laterality Date  . COLOSTOMY  2020   diverting colostomy at [facility]  . ILEOSTOMY  2020   diverting colostomy, [employer]       Family History  Problem Relation Age of  Onset  . Stroke Mother     Social History   Tobacco Use  . Smoking status: Current Some Day Smoker    Packs/day: 0.30    Types: Cigarettes  . Smokeless tobacco: Current User  Substance Use Topics  . Alcohol use: No  . Drug use: Not on file    Home Medications Prior to Admission medications   Medication Sig Start Date End Date Taking? Authorizing Provider  acetaminophen (TYLENOL) 325 MG tablet Take 650 mg by mouth every 6 (six) hours as needed for pain. 11/21/18  Yes [provider]  ferrous sulfate 325 (65 FE) MG tablet Take 1 tablet (325 mg total) by mouth daily with breakfast. 04/15/19 05/21/19 Yes [name], MD  MELATONIN PO Take 1 capsule by mouth at bedtime as needed (sleep).    Yes [provider]  metroNIDAZOLE (METROGEL) 0.75 % gel Apply 1 application topically daily as needed (rosacea).  11/21/18  Yes [provider]  midodrine (PROAMATINE) 5 MG tablet Take 1 tablet (5 mg total) by mouth 3 (three) times daily with meals. 05/20/19  Yes [name], MD  Oxycodone HCl 20 MG TABS Take 1-1.5 tablets (20-30 mg total) by mouth See admin instructions. Take 1 tablet in the morning, take 1.5 tablets at lunch, and take 1 tablet at night 05/06/19  Yes [name], MD    Allergies    Patient has no known allergies.  Review of  Systems   Review of Systems  All other systems reviewed and are negative.   Physical Exam Updated Vital Signs BP 112/80   Pulse (!) 113   Temp 98.7 F (37.1 C) (Oral)   Resp 20   Ht 6\' 1"  (1.854 m)   Wt 59 kg   SpO2 100%   BMI 17.16 kg/m   Physical Exam Vitals and nursing note reviewed.  Constitutional:      Appearance: He is well-developed.  HENT:     Head: Normocephalic and atraumatic.     Mouth/Throat:     Mouth: Mucous membranes are moist.  Cardiovascular:     Rate and Rhythm: Normal rate and regular rhythm.     Heart sounds: No murmur.  Pulmonary:     Effort: Pulmonary effort is normal. No  respiratory distress.     Breath sounds: Normal breath sounds.  Abdominal:     Palpations: Abdomen is soft.     Tenderness: There is abdominal tenderness.  Musculoskeletal:        General: Normal range of motion.     Cervical back: Neck supple.  Skin:    General: Skin is warm and dry.  Neurological:     Mental Status: He is alert.     ED Results / Procedures / Treatments   Labs (all labs ordered are listed, but only abnormal results are displayed) Labs Reviewed - No data to display  EKG None  Radiology No results found.  Procedures Procedures (including critical care time)  Medications Ordered in ED Medications  lidocaine (XYLOCAINE) 2 % jelly 1 application (1 application Other Given 05/21/19 1515)    ED Course  I have reviewed the triage vital signs and the nursing notes.  Pertinent labs & imaging results that were available during my care of the patient were reviewed by me and considered in my medical decision making (see chart for details).    MDM Rules/Calculators/A&P                      Lidocaine jelly to suprapubic site,  I attempted foley placement with a 16.  I was unable to thread catheter.   I spoke to Urology Dr. Louis Meckel.  Dr. Graylon Good Urology resident evaluated pt and was able to place.  Final Clinical Impression(s) / ED Diagnoses Final diagnoses:  Suprapubic catheter dysfunction, sequela    Rx / DC Orders ED Discharge Orders    None    An After Visit Summary was printed and given to the patient. Call Urology to schedule foley replacement next month.     Fransico Meadow, [place] 05/21/19 1901    Truddie Hidden, MD 05/23/19 [phone]

## 2019-05-21 NOTE — ED Triage Notes (Signed)
[facility] was changing supra pubic cath, took old one out and could not get one back in

## 2019-05-21 NOTE — Consult Note (Signed)
Urology Consult Note   Requesting Attending Physician:  Truddie Hidden, MD Service Providing Consult: Urology  Consulting Attending: Louis Meckel MD   Reason for Consult:   Suprapubic catheter management Rectourethral fistula  HPI: Dylan [name] is seen in consultation for reasons noted above at the request of Truddie Hidden, MD for evaluation of difficulty replacing suprapubic catheter.  This is a 49 y.o. 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  He reports that he has had suprapubic catheter in place for approximately 4 months; on chart review it appears it was initially placed by [facility] during time of colostomy operation on 01/19/2019. This is usually changed monthly with home health nurse.  Today, home health nurse removed prior catheter and was unable to replace suprapubic catheter due to meeting resistance.  Patient therefore presented to the emergency room.  He complained of suprapubic discomfort but no other changes to symptoms today.  Urology evaluated at bedside.  He previously had a 16 French suprapubic catheter.  Urology MD replaced suprapubic catheter with appropriate 16 French catheter by applying firm pressure.  Tract was noted to deviate slightly inferiorly and felt tight but patent.  Placement of catheter had return of yellow urine without clot.  No bleeding was noted during or after placement.  Manual irrigation of catheter returned clear yellow urine as well confirming appropriate position.   Past Medical History: Past Medical History:  Diagnosis Date  . Cancer ([place]) 2018   anal cancer  . Non Hodgkin's lymphoma ([place]) 1999  . S/P radiation therapy 07/07/14-7/20/216   anal ca     Past Surgical History:  Past Surgical History:  Procedure Laterality Date  . COLOSTOMY  2020   diverting colostomy at [facility]  . ILEOSTOMY  2020   diverting colostomy, [employer]     Medication: No current facility-administered medications for this encounter.   Current Outpatient Medications  Medication Sig Dispense Refill  . acetaminophen (TYLENOL) 325 MG tablet Take 650 mg by mouth every 6 (six) hours as needed for pain.    . ferrous sulfate 325 (65 FE) MG tablet Take 1 tablet (325 mg total) by mouth daily with breakfast. 90 tablet 1  . MELATONIN PO Take 1 capsule by mouth at bedtime as needed (sleep).     . metroNIDAZOLE (METROGEL) 0.75 % gel Apply 1 application topically daily as needed (rosacea).     . midodrine (PROAMATINE) 5 MG tablet Take 1 tablet (5 mg total) by mouth 3 (three) times daily with meals. 90 tablet 0  . Oxycodone HCl 20 MG TABS Take 1-1.5 tablets (20-30 mg total) by mouth See admin instructions. Take 1 tablet in the morning, take 1.5 tablets at lunch, and take 1 tablet at night 150 tablet 0    Allergies: No Known Allergies  Social History: Social History   Tobacco Use  . Smoking status: Current Some Day Smoker    Packs/day: 0.30    Types: Cigarettes  . Smokeless tobacco: Current User  Substance Use Topics  . Alcohol use: No  . Drug use: Not on file    Family History Family History  Problem Relation Age of Onset  . Stroke Mother     Review of Systems 10 systems were reviewed and are negative except as noted specifically in the HPI.  Objective   Vital signs in last 24 hours: BP 112/80   Pulse (!) 113   Temp 98.7  F (37.1 C) (Oral)   Resp 20   Ht 6\' 1"  (1.854 m)   Wt 59 kg   SpO2 100%   BMI 17.16 kg/m   Physical Exam General: Chronically ill-appearing HEENT: [place]/AT Pulmonary: Normal work of breathing Cardiovascular: HDS, tachycardia Abdomen: Mild suprapubic tenderness.  Colostomy bag in place with stool in bag.  Suprapubic site without any apparent trauma, bleeding, or erythema.  Skin around suprapubic site appears well-healed and healthy. Extremities: warm and well perfused Neuro: Appropriate, no focal  neurological deficits  Most Recent Labs: Lab Results  Component Value Date   WBC 14.3 (H) 04/25/2019   HGB 7.9 (L) 04/25/2019   HCT 27.6 (L) 04/25/2019   PLT 682 (H) 04/25/2019    Lab Results  Component Value Date   NA 130 (L) 04/25/2019   K 4.4 04/25/2019   CL 95 (L) 04/25/2019   CO2 19 (L) 04/25/2019   BUN 7 04/25/2019   CREATININE 0.79 04/25/2019   CALCIUM 10.0 04/25/2019   MG 1.7 03/11/2019   PHOS 2.8 03/11/2019    Lab Results  Component Value Date   INR 1.2 03/09/2019   APTT 23 (L) 03/09/2019     IMAGING: No results found.  ------  Assessment:  49 y.o. male with rectourethral fistula secondary to metastatic anal cancer managed with suprapubic catheter presenting with difficulty in replacing suprapubic catheter.   Replacement of 16 Fr suprapubic catheter was performed by urology MD by applying gentle but firm pressure through existing tract. Catheter was placed atraumatically but I did appreciate that tract appeared tight and deviated slightly inferiorly which may make replacement at home difficult. Patient can follow up in urology clinic in one month for next exchange. Manual irrigation through catheter confirmed return of urine. He may be leaking urine through his known fistula as well so urine output through catheter may be low. He was directed to call urology clinic if he has any issues before next exchange as well.   I offered ditropan prn for catheter related bladder spasms to patient but he was not interested in this medication at the time of my visit.   Recommendations: - Discharge home with catheter to drainage.  - Provide patient with [facility] clinic number to call us for catheter exchange appointment unless he prefers to follow up elsewhere (also has been seen at [place]). I agree that it is advisable to perform next exchange in a urology clinic to avoid complications.   Patient and case discussed with Dr Louis Meckel, attending surgeon.     Thank you for this consult. Please contact the urology consult pager with any further questions/concerns.

## 2019-05-30 ENCOUNTER — Inpatient Hospital Stay: Payer: Medicare Other

## 2019-05-30 ENCOUNTER — Other Ambulatory Visit: Payer: Self-pay | Admitting: Hematology and Oncology

## 2019-05-30 ENCOUNTER — Ambulatory Visit: Payer: Medicare Other | Admitting: Hematology and Oncology

## 2019-05-30 DIAGNOSIS — D500 Iron deficiency anemia secondary to blood loss (chronic): Secondary | ICD-10-CM

## 2019-05-30 DIAGNOSIS — C210 Malignant neoplasm of anus, unspecified: Secondary | ICD-10-CM

## 2019-06-04 ENCOUNTER — Telehealth: Payer: Self-pay | Admitting: Family Medicine

## 2019-06-04 DIAGNOSIS — G893 Neoplasm related pain (acute) (chronic): Secondary | ICD-10-CM

## 2019-06-04 DIAGNOSIS — C210 Malignant neoplasm of anus, unspecified: Secondary | ICD-10-CM

## 2019-06-04 MED ORDER — OXYCODONE HCL 20 MG PO TABS: 20.0000 mg | ORAL_TABLET | ORAL | 0 refills | Status: DC

## 2019-06-04 NOTE — Telephone Encounter (Signed)
Medication Refill - Medication:  Oxycodone HCl 20 MG TABS   Has the patient contacted their pharmacy? Yes advised to call.   Preferred Pharmacy (with phone number or street name):  [id number] [address], [place] Phone:  [phone]  Fax:  [phone]       Agent: Please be advised that RX refills may take up to 3 business days. We ask that you follow-up with your pharmacy.

## 2019-06-04 NOTE — Telephone Encounter (Signed)
Patient is requesting a refill of the following medications: Requested Prescriptions   Pending Prescriptions Disp Refills   Oxycodone HCl 20 MG TABS 150 tablet 0    Sig: Take 1-1.5 tablets (20-30 mg total) by mouth See admin instructions. Take 1 tablet in the morning, take 1.5 tablets at lunch, and take 1 tablet at night    Date of patient request: 06/04/2019 Last office visit: 05/20/2019 Video visit Date of last refill: 05/06/2019 Last refill amount: 150

## 2019-06-04 NOTE — Telephone Encounter (Signed)
Med regimen has been discussed recently.  Controlled substance database reviewed, last filled March 22.  Refill will be ordered for April 22.

## 2019-06-06 ENCOUNTER — Telehealth: Payer: Self-pay | Admitting: Family Medicine

## 2019-06-06 NOTE — Telephone Encounter (Signed)
Pt called and stated [facility] not [facility]  the [facility] for   Oxycodone HCl 20 MG TABS [id number]   Medication. Pt would like a call when this medication is sent into the pharmacy [phone] Please advise.

## 2019-06-06 NOTE — Telephone Encounter (Signed)
Pt Called his  went to the Pharmacy and his  med is not There his  said these is the 4th time his  call for These Please Advice

## 2019-06-07 ENCOUNTER — Inpatient Hospital Stay: Payer: Medicare Other | Attending: Hematology and Oncology

## 2019-06-07 ENCOUNTER — Other Ambulatory Visit: Payer: Self-pay | Admitting: Family Medicine

## 2019-06-07 ENCOUNTER — Inpatient Hospital Stay: Payer: Medicare Other | Admitting: Hematology and Oncology

## 2019-06-07 DIAGNOSIS — C210 Malignant neoplasm of anus, unspecified: Secondary | ICD-10-CM

## 2019-06-07 DIAGNOSIS — G893 Neoplasm related pain (acute) (chronic): Secondary | ICD-10-CM

## 2019-06-07 MED ORDER — OXYCODONE HCL 20 MG PO TABS: 20.0000 mg | ORAL_TABLET | ORAL | 0 refills | Status: DC

## 2019-06-07 NOTE — Progress Notes (Signed)
Recent oxycodone Rx not received by pharmacy. Reordered.

## 2019-06-11 ENCOUNTER — Other Ambulatory Visit: Payer: Self-pay

## 2019-06-11 ENCOUNTER — Other Ambulatory Visit: Payer: Medicare Other | Admitting: Internal Medicine

## 2019-06-14 ENCOUNTER — Other Ambulatory Visit: Payer: Self-pay | Admitting: Family Medicine

## 2019-06-14 ENCOUNTER — Other Ambulatory Visit: Payer: Self-pay

## 2019-06-14 ENCOUNTER — Other Ambulatory Visit: Payer: Medicare Other | Admitting: Internal Medicine

## 2019-06-14 DIAGNOSIS — I959 Hypotension, unspecified: Secondary | ICD-10-CM

## 2019-06-16 ENCOUNTER — Emergency Department (HOSPITAL_COMMUNITY): Payer: Medicare Other

## 2019-06-16 ENCOUNTER — Inpatient Hospital Stay (HOSPITAL_COMMUNITY)
Admission: EM | Admit: 2019-06-16 | Discharge: 2019-06-21 | DRG: 871 | Disposition: A | Discharge status: Home Health | Payer: Medicare Other | Attending: Internal Medicine | Admitting: Internal Medicine

## 2019-06-16 DIAGNOSIS — Z933 Colostomy status: Secondary | ICD-10-CM

## 2019-06-16 DIAGNOSIS — T83010A Breakdown (mechanical) of cystostomy catheter, initial encounter: Secondary | ICD-10-CM

## 2019-06-16 DIAGNOSIS — Z681 Body mass index (BMI) 19 or less, adult: Secondary | ICD-10-CM

## 2019-06-16 DIAGNOSIS — A419 Sepsis, unspecified organism: Secondary | ICD-10-CM | POA: Diagnosis not present

## 2019-06-16 DIAGNOSIS — N390 Urinary tract infection, site not specified: Secondary | ICD-10-CM | POA: Diagnosis present

## 2019-06-16 DIAGNOSIS — Z923 Personal history of irradiation: Secondary | ICD-10-CM

## 2019-06-16 DIAGNOSIS — C7951 Secondary malignant neoplasm of bone: Secondary | ICD-10-CM | POA: Diagnosis present

## 2019-06-16 DIAGNOSIS — Z7189 Other specified counseling: Secondary | ICD-10-CM

## 2019-06-16 DIAGNOSIS — C210 Malignant neoplasm of anus, unspecified: Secondary | ICD-10-CM | POA: Diagnosis present

## 2019-06-16 DIAGNOSIS — E43 Unspecified severe protein-calorie malnutrition: Secondary | ICD-10-CM | POA: Diagnosis present

## 2019-06-16 DIAGNOSIS — N360 Urethral fistula: Secondary | ICD-10-CM | POA: Diagnosis present

## 2019-06-16 DIAGNOSIS — R64 Cachexia: Secondary | ICD-10-CM | POA: Diagnosis present

## 2019-06-16 DIAGNOSIS — L509 Urticaria, unspecified: Secondary | ICD-10-CM | POA: Diagnosis present

## 2019-06-16 DIAGNOSIS — Z79890 Hormone replacement therapy (postmenopausal): Secondary | ICD-10-CM

## 2019-06-16 DIAGNOSIS — Z79891 Long term (current) use of opiate analgesic: Secondary | ICD-10-CM

## 2019-06-16 DIAGNOSIS — Y738 Miscellaneous gastroenterology and urology devices associated with adverse incidents, not elsewhere classified: Secondary | ICD-10-CM | POA: Diagnosis present

## 2019-06-16 DIAGNOSIS — T83090A Other mechanical complication of cystostomy catheter, initial encounter: Secondary | ICD-10-CM | POA: Diagnosis present

## 2019-06-16 DIAGNOSIS — C7800 Secondary malignant neoplasm of unspecified lung: Secondary | ICD-10-CM | POA: Diagnosis present

## 2019-06-16 DIAGNOSIS — Z8571 Personal history of Hodgkin lymphoma: Secondary | ICD-10-CM

## 2019-06-16 DIAGNOSIS — Z823 Family history of stroke: Secondary | ICD-10-CM

## 2019-06-16 DIAGNOSIS — D500 Iron deficiency anemia secondary to blood loss (chronic): Secondary | ICD-10-CM | POA: Diagnosis present

## 2019-06-16 DIAGNOSIS — L98499 Non-pressure chronic ulcer of skin of other sites with unspecified severity: Secondary | ICD-10-CM | POA: Diagnosis present

## 2019-06-16 DIAGNOSIS — R6521 Severe sepsis with septic shock: Secondary | ICD-10-CM | POA: Diagnosis present

## 2019-06-16 DIAGNOSIS — L89220 Pressure ulcer of left hip, unstageable: Secondary | ICD-10-CM | POA: Diagnosis present

## 2019-06-16 DIAGNOSIS — Z20822 Contact with and (suspected) exposure to covid-19: Secondary | ICD-10-CM | POA: Diagnosis present

## 2019-06-16 DIAGNOSIS — Z9119 Patient's noncompliance with other medical treatment and regimen: Secondary | ICD-10-CM

## 2019-06-16 DIAGNOSIS — F17210 Nicotine dependence, cigarettes, uncomplicated: Secondary | ICD-10-CM | POA: Diagnosis present

## 2019-06-16 DIAGNOSIS — C218 Malignant neoplasm of overlapping sites of rectum, anus and anal canal: Secondary | ICD-10-CM | POA: Diagnosis present

## 2019-06-16 DIAGNOSIS — S31000A Unspecified open wound of lower back and pelvis without penetration into retroperitoneum, initial encounter: Secondary | ICD-10-CM | POA: Diagnosis present

## 2019-06-16 DIAGNOSIS — R627 Adult failure to thrive: Secondary | ICD-10-CM | POA: Diagnosis present

## 2019-06-16 DIAGNOSIS — L89210 Pressure ulcer of right hip, unstageable: Secondary | ICD-10-CM | POA: Diagnosis present

## 2019-06-16 DIAGNOSIS — M4628 Osteomyelitis of vertebra, sacral and sacrococcygeal region: Secondary | ICD-10-CM | POA: Diagnosis present

## 2019-06-16 DIAGNOSIS — Z79899 Other long term (current) drug therapy: Secondary | ICD-10-CM

## 2019-06-16 LAB — URINALYSIS, ROUTINE W REFLEX MICROSCOPIC
Bacteria, UA: NONE SEEN
Bilirubin Urine: NEGATIVE
Glucose, UA: NEGATIVE mg/dL
Ketones, ur: 5 mg/dL — AB
Nitrite: POSITIVE — AB
Protein, ur: >=300 mg/dL — AB
RBC / HPF: >50 RBC/hpf — ABNORMAL HIGH (ref 0–5)
Specific Gravity, Urine: 1.022 (ref 1.005–1.030)
WBC, UA: >50 WBC/hpf — ABNORMAL HIGH (ref 0–5)
pH: 7 (ref 5.0–8.0)

## 2019-06-16 LAB — LACTIC ACID, PLASMA: Lactic Acid, Venous: 1.6 mmol/L (ref 0.5–1.9)

## 2019-06-16 LAB — CBC WITH DIFFERENTIAL/PLATELET
Abs Immature Granulocytes: 0.05 10*3/uL (ref 0.00–0.07)
Basophils Absolute: 0 10*3/uL (ref 0.0–0.1)
Basophils Relative: 0 %
Eosinophils Absolute: 0 10*3/uL (ref 0.0–0.5)
Eosinophils Relative: 0 %
HCT: 27.7 % — ABNORMAL LOW (ref 39.0–52.0)
Hemoglobin: 8.1 g/dL — ABNORMAL LOW (ref 13.0–17.0)
Immature Granulocytes: 0 %
Lymphocytes Relative: 9 %
Lymphs Abs: 1.3 10*3/uL (ref 0.7–4.0)
MCH: 19.8 pg — ABNORMAL LOW (ref 26.0–34.0)
MCHC: 29.2 g/dL — ABNORMAL LOW (ref 30.0–36.0)
MCV: 67.6 fL — ABNORMAL LOW (ref 80.0–100.0)
Monocytes Absolute: 1 10*3/uL (ref 0.1–1.0)
Monocytes Relative: 7 %
Neutro Abs: 11.3 10*3/uL — ABNORMAL HIGH (ref 1.7–7.7)
Neutrophils Relative %: 84 %
Platelets: 609 10*3/uL — ABNORMAL HIGH (ref 150–400)
RBC: 4.1 MIL/uL — ABNORMAL LOW (ref 4.22–5.81)
RDW: 20.7 % — ABNORMAL HIGH (ref 11.5–15.5)
WBC: 13.7 10*3/uL — ABNORMAL HIGH (ref 4.0–10.5)
nRBC: 0 % (ref 0.0–0.2)

## 2019-06-16 LAB — COMPREHENSIVE METABOLIC PANEL
ALT: 11 U/L (ref 0–44)
AST: 11 U/L — ABNORMAL LOW (ref 15–41)
Albumin: 2 g/dL — ABNORMAL LOW (ref 3.5–5.0)
Alkaline Phosphatase: 78 U/L (ref 38–126)
Anion gap: 8 (ref 5–15)
BUN: 15 mg/dL (ref 6–20)
CO2: 26 mmol/L (ref 22–32)
Calcium: 11.1 mg/dL — ABNORMAL HIGH (ref 8.9–10.3)
Chloride: 99 mmol/L (ref 98–111)
Creatinine, Ser: 0.72 mg/dL (ref 0.61–1.24)
GFR calc Af Amer: >60 mL/min (ref >60)
GFR calc non Af Amer: >60 mL/min (ref >60)
Glucose, Bld: 96 mg/dL (ref 70–99)
Potassium: 4.2 mmol/L (ref 3.5–5.1)
Sodium: 133 mmol/L — ABNORMAL LOW (ref 135–145)
Total Bilirubin: 0.5 mg/dL (ref 0.3–1.2)
Total Protein: 7.5 g/dL (ref 6.5–8.1)

## 2019-06-16 LAB — PROTIME-INR
INR: 1.2 (ref 0.8–1.2)
Prothrombin Time: 15.1 seconds (ref 11.4–15.2)

## 2019-06-16 LAB — APTT: aPTT: 32 seconds (ref 24–36)

## 2019-06-16 MED ORDER — SODIUM CHLORIDE 0.9 % IV SOLN
2.0000 g | Freq: Once | INTRAVENOUS | Status: AC
  Administered 2019-06-16: 2 g via INTRAVENOUS
  Filled 2019-06-16: qty 2

## 2019-06-16 MED ORDER — VANCOMYCIN HCL IN DEXTROSE 1-5 GM/200ML-% IV SOLN
1000.0000 mg | Freq: Once | INTRAVENOUS | Status: AC
  Administered 2019-06-16: 1000 mg via INTRAVENOUS
  Filled 2019-06-16: qty 200

## 2019-06-16 MED ORDER — METRONIDAZOLE IN NACL 5-0.79 MG/ML-% IV SOLN
500.0000 mg | Freq: Once | INTRAVENOUS | Status: AC
  Administered 2019-06-16: 500 mg via INTRAVENOUS
  Filled 2019-06-16: qty 100

## 2019-06-16 MED ORDER — LIDOCAINE HCL URETHRAL/MUCOSAL 2 % EX GEL
1.0000 "application " | Freq: Once | CUTANEOUS | Status: DC
  Filled 2019-06-16: qty 30

## 2019-06-16 MED ORDER — LIDOCAINE HCL (CARDIAC) PF 100 MG/5ML IV SOSY
PREFILLED_SYRINGE | INTRAVENOUS | Status: AC
  Filled 2019-06-16: qty 5

## 2019-06-16 MED ORDER — DIPHENHYDRAMINE HCL 12.5 MG/5ML PO ELIX
12.5000 mg | ORAL_SOLUTION | Freq: Once | ORAL | Status: AC
  Administered 2019-06-16: 12.5 mg via ORAL
  Filled 2019-06-16: qty 5

## 2019-06-16 MED ORDER — FENTANYL CITRATE (PF) 100 MCG/2ML IJ SOLN
100.0000 ug | Freq: Once | INTRAMUSCULAR | Status: AC
  Administered 2019-06-16: 100 ug via INTRAVENOUS
  Filled 2019-06-16: qty 2

## 2019-06-16 NOTE — ED Notes (Signed)
Accidentally clicked off foley completion

## 2019-06-16 NOTE — ED Provider Notes (Signed)
  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  He had a palliative consult in January 2021 and was full code at that time.  Since then, he has been at home and gets home health nurse every 2 weeks.  He comes in today because he is unable to care for himself at home.  He has not been able to eat, walk.   On initially arrival, patient was tachycardic and hypotensive.  Code sepsis was initiated.  CMP showed BUN and creatinine within normal limits.  CBC did show leukocytosis of 13.7.  Lactic was normal.  Patient was started on fluid and antibiotics.  On evaluation by previous PA of his suprapubic catheter, he had purulent drainage noted.  Attempts were made in the ED to replace his suprapubic catheter but were unsuccessful.  Urology came to bedside and was able to flush it and actually replace the catheter.  Urine culture sent.  Patient signed out to me by previous provider for admission.  At this time, patient is unable to take care of himself at home and has worsening decline.  Patient will need palliative consult for further evaluation.  Discussed with Dr. Cyd Silence (hospitalist) who accepts patient for admissoin.   Portions of this note were generated with Lobby[employer]. Dictation errors may occur despite best attempts at proofreading.    Volanda Napoleon, PA-C 06/16/19 [id number]    Sherwood Gambler, MD 06/17/19 [phone]

## 2019-06-16 NOTE — ED Triage Notes (Signed)
Pt from home via EMS-Pt reports to EMS that pt increased weakness over the past several days. Pt has sacral wound that is malodorous. Pt is alert to person and situation. Baseline unknown.

## 2019-06-16 NOTE — Progress Notes (Signed)
A consult was received from an ED physician for vanc/cefepime per pharmacy dosing.  The patient's profile has been reviewed for ht/wt/allergies/indication/available labs.   A one time order has been placed for vanc 1g and cefepime 2g.  Further antibiotics/pharmacy consults should be ordered by admitting physician if indicated.                       Thank you, Kara Mead 06/16/2019  6:51 PM

## 2019-06-16 NOTE — ED Provider Notes (Signed)
[facility] DEPT Provider Note   CSN: [id number] Arrival date & time: 06/16/19  1833     History Chief Complaint  Patient presents with  . Weakness  . Wound Infection    Dylan [name] is a 49 y.o. 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  He is also cachectic.  Review of EMR shows that he had a palliative care consult in January 2021 and at that time desired full resuscitative efforts.  The patient comes to the emergency department via EMS with weakness and failure to thrive.  He has been unable to walk for the past several days.  He reports that he has someone who comes out to the house to help him every 2 weeks and that is to take care of his wounds.  EMS reports he was tachycardic and hypotensive at arrival.  Patient notably also has hives over his face and skin for unknown reasons.  He denies any current itching.  EMS reports that he is mildly confused.  HPI     Past Medical History:  Diagnosis Date  . Cancer ([place]) 2018   anal cancer  . Non Hodgkin's lymphoma ([place]) 1999  . S/P radiation therapy 07/07/14-7/20/216   anal ca     Patient Active Problem List   Diagnosis Date Noted  . Pressure injury of skin 03/11/2019  . Severe sepsis ([place]) 03/09/2019  . Wound discharge 03/09/2019  . Cancer cachexia ([place]) 02/06/2019  . Perforation of rectum from anal cancer s/p diverting colostomy 01/16/2019 02/06/2019  . Anal cellulitis 02/05/2019  . Septic shock ([place]) 02/05/2019  . 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 Colostomy - diverting loop colostomy in place Dec 2020 02/05/2019  . Suprapubic tube in place Dec 2020 for rectourethral  fistula 02/05/2019  . Sepsis due to undetermined organism ([place]) 02/04/2019  . Microcytic anemia 02/04/2019  . Sepsis secondary to UTI ([place]) 02/04/2019  . Malnutrition of moderate degree 10/20/2018  . Hypotension 10/18/2018  . Tachycardia 10/18/2018  . Anemia 10/18/2018  . Iron deficiency 10/18/2018  . Sepsis ([place]) 10/18/2018  . Cancer associated pain 02/23/2017  . Metastatic anal cancer 02/10/2014    Past Surgical History:  Procedure Laterality Date  . COLOSTOMY  2020   diverting colostomy at [facility]  . ILEOSTOMY  2020   diverting colostomy, [employer]       Family History  Problem Relation Age of Onset  . Stroke Mother     Social History   Tobacco Use  . Smoking status: Current Some Day Smoker    Packs/day: 0.30    Types: Cigarettes  . Smokeless tobacco: Current User  Substance Use Topics  . Alcohol use: No  . Drug use: Not on file    Home Medications Prior to Admission medications   Medication Sig Start Date End Date Taking? Authorizing Provider  acetaminophen (TYLENOL) 325 MG tablet Take 650 mg by mouth every 6 (six) hours as needed for pain. 11/21/18  Yes [provider]  MELATONIN PO Take 1 capsule by mouth at bedtime as needed (sleep).    Yes [provider]  metroNIDAZOLE (METROGEL) 0.75 % gel Apply 1 application topically daily as needed (rosacea).  11/21/18  Yes [provider]  midodrine ([place])  5 MG tablet TAKE 1 TABLET BY MOUTH 3 TIMES A DAY WITH MEALS Patient taking differently: Take 5 mg by mouth 3 (three) times daily with meals.  06/14/19  Yes [name], MD  Oxycodone HCl 20 MG TABS Take 1-1.5 tablets (20-30 mg total) by mouth See admin instructions. Take 1 tablet in the morning, take 1.5 tablets at lunch, and take 1 tablet at night Patient taking differently: Take 20-30 mg by mouth See admin instructions. Take 1 tablet (20mg ) by mouth in the morning, take 1.5 tablets (30mg ) by mouth at lunch, and take 1 tablet  (20mg ) by mouth at night. 06/07/19  Yes [name], MD  ferrous sulfate 325 (65 FE) MG tablet Take 1 tablet (325 mg total) by mouth daily with breakfast. 04/15/19 05/21/19  [name], MD    Allergies    Patient has no known allergies.  Review of Systems   Review of Systems Ten systems reviewed and are negative for acute change, except as noted in the HPI.   Physical Exam Updated Vital Signs BP 102/88   Pulse (!) 112   Resp (!) 23   SpO2 100%   Physical Exam Vitals and nursing note reviewed.  Constitutional:      General: He is not in acute distress.    Appearance: He is cachectic. He is ill-appearing. He is not diaphoretic.     Comments: Obvious temporal wasting, severely cachectic  HENT:     Head: Normocephalic and atraumatic.  Eyes:     General: No scleral icterus.    Conjunctiva/sclera: Conjunctivae normal.  Cardiovascular:     Rate and Rhythm: Normal rate and regular rhythm.     Heart sounds: Normal heart sounds.  Pulmonary:     Effort: Pulmonary effort is normal. No respiratory distress.     Breath sounds: Normal breath sounds.  Abdominal:     Palpations: Abdomen is soft.     Tenderness: There is no abdominal tenderness.    Genitourinary:    Comments: Patient has a large, gaping wound from the top of his gluteal cleft all the way through to the perineum.  There is wound packing in place, foul odor present Musculoskeletal:     Cervical back: Normal range of motion and neck supple.  Skin:    General: Skin is warm and dry.     Findings: Rash present. Rash is urticarial.  Neurological:     Mental Status: He is alert.  Psychiatric:        Behavior: Behavior normal.     ED Results / Procedures / Treatments   Labs (all labs ordered are listed, but only abnormal results are displayed) Labs Reviewed  COMPREHENSIVE METABOLIC PANEL - Abnormal; Notable for the following components:      Result Value   Sodium 133 (*)    Calcium 11.1 (*)    Albumin 2.0  (*)    AST 11 (*)    All other components within normal limits  CBC WITH DIFFERENTIAL/PLATELET - Abnormal; Notable for the following components:   WBC 13.7 (*)    RBC 4.10 (*)    Hemoglobin 8.1 (*)    HCT 27.7 (*)    MCV 67.6 (*)    MCH 19.8 (*)    MCHC 29.2 (*)    RDW 20.7 (*)    Platelets 609 (*)    Neutro Abs 11.3 (*)    All other components within normal limits  CULTURE, BLOOD (ROUTINE X 2)  CULTURE,  BLOOD (ROUTINE X 2)  URINE CULTURE  RESPIRATORY PANEL BY RT PCR (FLU A&B, COVID)  LACTIC ACID, PLASMA  APTT  PROTIME-INR  URINALYSIS, ROUTINE W REFLEX MICROSCOPIC    EKG None  Radiology DG Chest Port 1 View  Result Date: 06/16/2019 CLINICAL DATA:  Pt from home via EMS-Pt reports to EMS that pt increased weakness over the past several days. Pt has sacral wound that is malodorous. EXAM: PORTABLE CHEST 1 VIEW COMPARISON:  Chest radiograph 03/09/2019 FINDINGS: The cardiomediastinal contours are within normal limits. There are innumerable bilateral pulmonary nodules which have increased in size and number compared to the prior radiograph. No focal consolidation no pneumothorax or significant pleural effusion. No acute finding in the visualized skeleton. IMPRESSION: Innumerable bilateral pulmonary nodules which have increased in size and number compared to the prior radiograph, likely representing metastatic disease. Electronically Signed   By: Audie Pinto M.D.   On: 06/16/2019 19:33    Procedures .Critical Care Performed by: Margarita Mail, PA-C Authorized by: Margarita Mail, PA-C   Critical care provider statement:    Critical care time (minutes):  50   Critical care time was exclusive of:  Separately billable procedures and treating other patients   Critical care was necessary to treat or prevent imminent or life-threatening deterioration of the following conditions:  Sepsis   Critical care was time spent personally by me on the following activities:  Discussions with  consultants, evaluation of patient's response to treatment, examination of patient, ordering and performing treatments and interventions, ordering and review of laboratory studies, ordering and review of radiographic studies, pulse oximetry, re-evaluation of patient's condition, obtaining history from patient or surrogate and review of old charts   (including critical care time)  Medications Ordered in ED Medications  ceFEPIme (MAXIPIME) 2 g in sodium chloride 0.9 % 100 mL IVPB (0 g Intravenous Stopped 06/16/19 2041)  metroNIDAZOLE (FLAGYL) IVPB 500 mg (0 mg Intravenous Stopped 06/16/19 2102)  vancomycin (VANCOCIN) IVPB 1000 mg/200 mL premix (0 mg Intravenous Stopped 06/16/19 2221)  diphenhydrAMINE (BENADRYL) 12.5 MG/5ML elixir 12.5 mg (12.5 mg Oral Given 06/16/19 1900)  fentaNYL (SUBLIMAZE) injection 100 mcg (100 mcg Intravenous Given 06/16/19 2102)    ED Course  I have reviewed the triage vital signs and the nursing notes.  Pertinent labs & imaging results that were available during my care of the patient were reviewed by me and considered in my medical decision making (see chart for details).  Clinical Course as of Jun 15 2252  Nancy Fetter Jun 16, 2019  1937 Patient reports severe pain in his bottom. He normally takes Oxycodone 20 mg PO   [AH]  2039 WBC(!): 13.7 [AH]  2039 Platelets(!): 609 [AH]  2039 NEUT#(!): 11.3 [AH]  2039 Sodium(!): 133 [AH]  2039 Calcium(!): 11.1 [AH]    Clinical Course User Index [AH] Margarita Mail, PA-C   MDM Rules/Calculators/A&P                      [id number] VS:  Vitals:   06/16/19 2200 06/16/19 2215 06/16/19 2230 06/16/19 2245  BP: 108/81 98/80 100/77 102/88  Pulse: (!) 113 (!) 110 95 (!) 112  Resp:  13 (!) 23   SpO2: 100% 99% 97% 100%    [id number] is gathered by patient, ems and emr. Previous records obtained and reviewed. DDX:The patient's complaint of weakness involves an extensive number of diagnostic and treatment options, and is a complaint that  carries with it a high risk of complications, morbidity,  and potential mortality. Given the large differential diagnosis, medical decision making is of high complexity. The differential diagnosis of weakness includes but is not limited to neurologic causes (GBS, myasthenia gravis, CVA, MS, ALS, transverse myelitis, spinal cord injury, CVA, botulism, ) and other causes: ACS, Arrhythmia, syncope, orthostatic hypotension, sepsis, hypoglycemia, electrolyte disturbance, hypothyroidism, respiratory failure, symptomatic anemia, dehydration, heat injury, polypharmacy, malignancy, sepsis. Labs: I ordered reviewed and interpreted labs which include CBC which shows elevated white blood cell count, microcytic anemia at baseline, thrombocytosis, left shift.  CMP shows mild hyponatremia, elevated calcium in the setting of metastatic rectal cancer, low albumin level.  Plasma lactic acid is within normal limits, PT/INR and APTT also within normal limits, Covid test pending, urinalysis ordered and pending. Imaging: I ordered and reviewed images which included one-view portable chest x-ray. I independently visualized and interpreted all imaging. Significant findings include multiple innumerable nodules seen within the chest x-ray likely representing worsening metastatic disease process. EKG: Consults: I consulted with Dr. Fredderick Phenix of urology.  I was unable to remove the suprapubic catheter to change.  There is copious purulent discharge around the catheter site.  Multiple providers attempted removal prior to consulting him.  He was able to change the suprapubic catheter.  Urine collected and is currently pending. MDM: This is an unfortunate gentleman with metastatic rectal carcinoma, he has a chronic deep rectal wound with packing taking up the majority of his rectal and perineum region, colostomy in place, suprapubic catheter upon arrival was filthy with debris, Dr. Mcarthur Rossetti and purulence.  That was changed.  I did call a code sepsis  upon arrival due to tachycardia and hypotension however given the fact that the patient is only 59 kg I suspect his pressures are quite low as he is cachectic.  I discontinued code sepsis the patient has gotten fluid and broad-spectrum antibiotics given his multiple sources for potential infection.  Patient is having an elevated white blood cell count.  He is afebrile on arrival.  I suspect primarily the patient is dying of end-stage metastatic cancer and has failure to thrive.  Review of EMR shows that the patient has previously had palliative care consult and at that time is opted to be full CODE STATUS.  I suspect that he will need another consult with palliative care or hospice and end of life planning.  Patient also was given Benadryl upon arrival due to hives over his arms and face.  Unsure what the cause of this was.  He was unaware that he had hives and was not complaining of itching.  No airway involvement I have very low suspicion for anaphylaxis.  Patient has been laying in bed unable to take his medications or eat.  Question if the patient may have developed a latex allergy given use of gloves and latex tourniquets for IV placement.  Patient is otherwise stable here in the emergency department will need admission.  I given signout to [place] who will consult the hospitalist for admission.    Final Clinical Impression(s) / ED Diagnoses Final diagnoses:  FTT (failure to thrive) in adult  Hypercalcemia  Suprapubic catheter dysfunction, initial encounter [facility])    Rx / DC Orders ED Discharge Orders    None       Margarita Mail, PA-C 06/16/19 [place], [place], DO 06/17/19 1503

## 2019-06-16 NOTE — ED Notes (Signed)
UA and Urine culture marked complete by accident. Still awaiting actual urine sample

## 2019-06-17 ENCOUNTER — Inpatient Hospital Stay (HOSPITAL_COMMUNITY): Payer: Medicare Other

## 2019-06-17 ENCOUNTER — Encounter (HOSPITAL_COMMUNITY): Payer: Self-pay | Admitting: Internal Medicine

## 2019-06-17 ENCOUNTER — Other Ambulatory Visit: Payer: Self-pay

## 2019-06-17 DIAGNOSIS — M4628 Osteomyelitis of vertebra, sacral and sacrococcygeal region: Secondary | ICD-10-CM | POA: Diagnosis present

## 2019-06-17 DIAGNOSIS — Z8571 Personal history of Hodgkin lymphoma: Secondary | ICD-10-CM | POA: Diagnosis not present

## 2019-06-17 DIAGNOSIS — S31000D Unspecified open wound of lower back and pelvis without penetration into retroperitoneum, subsequent encounter: Secondary | ICD-10-CM | POA: Diagnosis not present

## 2019-06-17 DIAGNOSIS — R627 Adult failure to thrive: Secondary | ICD-10-CM | POA: Diagnosis present

## 2019-06-17 DIAGNOSIS — E43 Unspecified severe protein-calorie malnutrition: Secondary | ICD-10-CM

## 2019-06-17 DIAGNOSIS — Y738 Miscellaneous gastroenterology and urology devices associated with adverse incidents, not elsewhere classified: Secondary | ICD-10-CM | POA: Diagnosis present

## 2019-06-17 DIAGNOSIS — R64 Cachexia: Secondary | ICD-10-CM | POA: Diagnosis present

## 2019-06-17 DIAGNOSIS — N360 Urethral fistula: Secondary | ICD-10-CM | POA: Diagnosis present

## 2019-06-17 DIAGNOSIS — C210 Malignant neoplasm of anus, unspecified: Secondary | ICD-10-CM | POA: Diagnosis not present

## 2019-06-17 DIAGNOSIS — Z9119 Patient's noncompliance with other medical treatment and regimen: Secondary | ICD-10-CM | POA: Diagnosis not present

## 2019-06-17 DIAGNOSIS — Z20822 Contact with and (suspected) exposure to covid-19: Secondary | ICD-10-CM | POA: Diagnosis present

## 2019-06-17 DIAGNOSIS — Z933 Colostomy status: Secondary | ICD-10-CM | POA: Diagnosis not present

## 2019-06-17 DIAGNOSIS — C7951 Secondary malignant neoplasm of bone: Secondary | ICD-10-CM | POA: Diagnosis present

## 2019-06-17 DIAGNOSIS — Z79890 Hormone replacement therapy (postmenopausal): Secondary | ICD-10-CM | POA: Diagnosis not present

## 2019-06-17 DIAGNOSIS — Z515 Encounter for palliative care: Secondary | ICD-10-CM | POA: Diagnosis not present

## 2019-06-17 DIAGNOSIS — N390 Urinary tract infection, site not specified: Secondary | ICD-10-CM | POA: Diagnosis present

## 2019-06-17 DIAGNOSIS — S31000A Unspecified open wound of lower back and pelvis without penetration into retroperitoneum, initial encounter: Secondary | ICD-10-CM | POA: Diagnosis present

## 2019-06-17 DIAGNOSIS — Z681 Body mass index (BMI) 19 or less, adult: Secondary | ICD-10-CM | POA: Diagnosis not present

## 2019-06-17 DIAGNOSIS — R6521 Severe sepsis with septic shock: Secondary | ICD-10-CM | POA: Diagnosis present

## 2019-06-17 DIAGNOSIS — A419 Sepsis, unspecified organism: Principal | ICD-10-CM

## 2019-06-17 DIAGNOSIS — Z7189 Other specified counseling: Secondary | ICD-10-CM | POA: Diagnosis not present

## 2019-06-17 DIAGNOSIS — C218 Malignant neoplasm of overlapping sites of rectum, anus and anal canal: Secondary | ICD-10-CM | POA: Diagnosis present

## 2019-06-17 DIAGNOSIS — T83090A Other mechanical complication of cystostomy catheter, initial encounter: Secondary | ICD-10-CM | POA: Diagnosis present

## 2019-06-17 DIAGNOSIS — L98499 Non-pressure chronic ulcer of skin of other sites with unspecified severity: Secondary | ICD-10-CM | POA: Diagnosis present

## 2019-06-17 DIAGNOSIS — F17210 Nicotine dependence, cigarettes, uncomplicated: Secondary | ICD-10-CM | POA: Diagnosis present

## 2019-06-17 DIAGNOSIS — D500 Iron deficiency anemia secondary to blood loss (chronic): Secondary | ICD-10-CM | POA: Diagnosis present

## 2019-06-17 DIAGNOSIS — Z823 Family history of stroke: Secondary | ICD-10-CM | POA: Diagnosis not present

## 2019-06-17 DIAGNOSIS — C7800 Secondary malignant neoplasm of unspecified lung: Secondary | ICD-10-CM | POA: Diagnosis present

## 2019-06-17 DIAGNOSIS — L509 Urticaria, unspecified: Secondary | ICD-10-CM | POA: Diagnosis present

## 2019-06-17 DIAGNOSIS — Z923 Personal history of irradiation: Secondary | ICD-10-CM | POA: Diagnosis not present

## 2019-06-17 DIAGNOSIS — T83511A Infection and inflammatory reaction due to indwelling urethral catheter, initial encounter: Secondary | ICD-10-CM

## 2019-06-17 LAB — CBC WITH DIFFERENTIAL/PLATELET
Abs Immature Granulocytes: 0.06 10*3/uL (ref 0.00–0.07)
Basophils Absolute: 0 10*3/uL (ref 0.0–0.1)
Basophils Relative: 0 %
Eosinophils Absolute: 0 10*3/uL (ref 0.0–0.5)
Eosinophils Relative: 0 %
HCT: 24.5 % — ABNORMAL LOW (ref 39.0–52.0)
Hemoglobin: 7.2 g/dL — ABNORMAL LOW (ref 13.0–17.0)
Immature Granulocytes: 1 %
Lymphocytes Relative: 12 %
Lymphs Abs: 1.3 10*3/uL (ref 0.7–4.0)
MCH: 19.9 pg — ABNORMAL LOW (ref 26.0–34.0)
MCHC: 29.4 g/dL — ABNORMAL LOW (ref 30.0–36.0)
MCV: 67.7 fL — ABNORMAL LOW (ref 80.0–100.0)
Monocytes Absolute: 0.6 10*3/uL (ref 0.1–1.0)
Monocytes Relative: 6 %
Neutro Abs: 8.8 10*3/uL — ABNORMAL HIGH (ref 1.7–7.7)
Neutrophils Relative %: 81 %
Platelets: 541 10*3/uL — ABNORMAL HIGH (ref 150–400)
RBC: 3.62 MIL/uL — ABNORMAL LOW (ref 4.22–5.81)
RDW: 20.4 % — ABNORMAL HIGH (ref 11.5–15.5)
WBC: 10.8 10*3/uL — ABNORMAL HIGH (ref 4.0–10.5)
nRBC: 0 % (ref 0.0–0.2)

## 2019-06-17 LAB — PROCALCITONIN: Procalcitonin: 0.15 ng/mL

## 2019-06-17 LAB — RESPIRATORY PANEL BY RT PCR (FLU A&B, COVID)
Influenza A by PCR: NEGATIVE
Influenza B by PCR: NEGATIVE
SARS Coronavirus 2 by RT PCR: NEGATIVE

## 2019-06-17 LAB — IRON AND TIBC
Iron: 13 ug/dL — ABNORMAL LOW (ref 45–182)
Saturation Ratios: 8 % — ABNORMAL LOW (ref 17.9–39.5)
TIBC: 165 ug/dL — ABNORMAL LOW (ref 250–450)
UIBC: 152 ug/dL

## 2019-06-17 LAB — CORTISOL: Cortisol, Plasma: 22.5 ug/dL

## 2019-06-17 LAB — URINE CULTURE

## 2019-06-17 LAB — FERRITIN: Ferritin: 24 ng/mL (ref 24–336)

## 2019-06-17 MED ORDER — VANCOMYCIN HCL 750 MG/150ML IV SOLN
750.0000 mg | Freq: Two times a day (BID) | INTRAVENOUS | Status: DC
  Administered 2019-06-17 – 2019-06-21 (×8): 750 mg via INTRAVENOUS
  Filled 2019-06-17 (×9): qty 150

## 2019-06-17 MED ORDER — LACTATED RINGERS IV SOLN: INTRAVENOUS | Status: AC

## 2019-06-17 MED ORDER — ACETAMINOPHEN 650 MG RE SUPP: 650.0000 mg | Freq: Four times a day (QID) | RECTAL | Status: DC | PRN

## 2019-06-17 MED ORDER — NYSTATIN 100000 UNIT/GM EX POWD
Freq: Two times a day (BID) | CUTANEOUS | Status: DC
  Filled 2019-06-17: qty 15

## 2019-06-17 MED ORDER — MIDODRINE HCL 5 MG PO TABS
5.0000 mg | ORAL_TABLET | Freq: Three times a day (TID) | ORAL | Status: DC
  Administered 2019-06-17 (×3): 5 mg via ORAL
  Filled 2019-06-17 (×4): qty 1

## 2019-06-17 MED ORDER — LACTATED RINGERS IV BOLUS (SEPSIS)
1000.0000 mL | Freq: Once | INTRAVENOUS | Status: AC
  Administered 2019-06-17: 1000 mL via INTRAVENOUS

## 2019-06-17 MED ORDER — SODIUM CHLORIDE 0.9 % IV SOLN
2.0000 g | Freq: Three times a day (TID) | INTRAVENOUS | Status: DC
  Administered 2019-06-17 – 2019-06-18 (×4): 2 g via INTRAVENOUS
  Filled 2019-06-17 (×4): qty 2

## 2019-06-17 MED ORDER — COLLAGENASE 250 UNIT/GM EX OINT
TOPICAL_OINTMENT | Freq: Every day | CUTANEOUS | Status: DC
  Filled 2019-06-17: qty 30

## 2019-06-17 MED ORDER — SODIUM CHLORIDE 0.9 % IV SOLN: 2.0000 g | Freq: Once | INTRAVENOUS | Status: DC

## 2019-06-17 MED ORDER — ONDANSETRON HCL 4 MG/2ML IJ SOLN: 4.0000 mg | Freq: Four times a day (QID) | INTRAMUSCULAR | Status: DC | PRN

## 2019-06-17 MED ORDER — OXYCODONE HCL 5 MG PO TABS
20.0000 mg | ORAL_TABLET | Freq: Four times a day (QID) | ORAL | Status: DC | PRN
  Administered 2019-06-17 – 2019-06-21 (×10): 20 mg via ORAL
  Filled 2019-06-17 (×11): qty 4

## 2019-06-17 MED ORDER — VANCOMYCIN HCL IN DEXTROSE 1-5 GM/200ML-% IV SOLN
1000.0000 mg | Freq: Two times a day (BID) | INTRAVENOUS | Status: DC
  Administered 2019-06-17: 1000 mg via INTRAVENOUS
  Filled 2019-06-17: qty 200

## 2019-06-17 MED ORDER — ENOXAPARIN SODIUM 40 MG/0.4ML ~~LOC~~ SOLN
40.0000 mg | Freq: Every day | SUBCUTANEOUS | Status: DC
  Administered 2019-06-17 – 2019-06-21 (×2): 40 mg via SUBCUTANEOUS
  Filled 2019-06-17 (×5): qty 0.4

## 2019-06-17 MED ORDER — HYDROCORTISONE NA SUCCINATE PF 100 MG IJ SOLR
100.0000 mg | Freq: Three times a day (TID) | INTRAMUSCULAR | Status: DC
  Administered 2019-06-17 – 2019-06-20 (×8): 100 mg via INTRAVENOUS
  Filled 2019-06-17 (×8): qty 2

## 2019-06-17 MED ORDER — METRONIDAZOLE IN NACL 5-0.79 MG/ML-% IV SOLN
500.0000 mg | Freq: Three times a day (TID) | INTRAVENOUS | Status: DC
  Administered 2019-06-17 – 2019-06-18 (×4): 500 mg via INTRAVENOUS
  Filled 2019-06-17 (×4): qty 100

## 2019-06-17 MED ORDER — ENSURE ENLIVE PO LIQD
237.0000 mL | Freq: Two times a day (BID) | ORAL | Status: DC
  Administered 2019-06-17 – 2019-06-18 (×3): 237 mL via ORAL

## 2019-06-17 MED ORDER — SODIUM CHLORIDE (PF) 0.9 % IJ SOLN
INTRAMUSCULAR | Status: AC
  Filled 2019-06-17: qty 50

## 2019-06-17 MED ORDER — ONDANSETRON HCL 4 MG PO TABS: 4.0000 mg | ORAL_TABLET | Freq: Four times a day (QID) | ORAL | Status: DC | PRN

## 2019-06-17 MED ORDER — ACETAMINOPHEN 325 MG PO TABS: 650.0000 mg | ORAL_TABLET | Freq: Four times a day (QID) | ORAL | Status: DC | PRN

## 2019-06-17 MED ORDER — IOHEXOL 300 MG/ML  SOLN
100.0000 mL | Freq: Once | INTRAMUSCULAR | Status: AC | PRN
  Administered 2019-06-17: 100 mL via INTRAVENOUS

## 2019-06-17 NOTE — Consult Note (Signed)
[place] Nurse Consult Note: Reason for Consult:  Nonhealing surgical wound to rectal/sacral area.  Unstageable pressure injuries to bilateral hips and LLQ colostomy Wound type:surgical/pressure Pressure Injury POA: Yes Measurement: 23 cm x 7.4 cm x 10 cm  Rectal/sacral wound Left iliac crest with 2 areas of breakdown:  Proximal:  2 cm linear wound with slough to wound bed Distal:  4 cm x 4 cm wound with slough to wound bed.  Right iliac crest: 4 cm x 4 cm with slough to wound bed Inguinal folds and scrotal breakdown from moisture. Nystatin powder in place.  Wound [id number] red with darker tissue Drainage (amount, consistency, odor) moderate serosanguinous  Musty odor.  Periwound:intact Dressing procedure/placement/frequency: Cleanse sacral/rectal wound with NS and pat dry.  Apply Dakins moist kerlix to wound bed.  Cover with dry gauze and ABD pads/tape.  Change daily,  Cleanse wounds to hips with NS and pat dry.  Apply Santyl to wound bed.  Cover  with NS moist gauze and secure with dry gauze and tape. Change daily.  Interdry to inguinal folds.  Will not follow at this time.  Please re-consult if needed.  Domenic Moras MSN, RN, FNP-BC [facility]ON Wound, Ostomy, Continence Nurse Pager [phone]

## 2019-06-17 NOTE — H&P (Signed)
History and Physical    Dylan [name] [id number] DOB: 11/18/1970 DOA: 06/16/2019  PCP: [name], MD  Patient coming from: Home   Chief Complaint:  Chief Complaint  Patient presents with  . Weakness  . Wound Infection     HPI:  49 year-old male with past medical history of Hodgkin's lymphoma,  metastatic squamous anal cancer (dx 2018, S/P radiation therapy, diverting colostomy and suprapubic cath, currently not receiving Tx) who presents to [facility] hospital with complaints of generalized weakness and poor oral intake.  Patient complains of for the past 1 week he has been experiencing progressively worsening generalized weakness.  Patient states that because of this he is having associated difficulty walking.  Also complains of extremely poor oral intake due to lack of appetite over the span of time.  Patient states that over the same span of time the urine output from his suprapubic catheter has been extremely cloudy and foul-smelling.  Patient complains of mild lower abdominal pain, nonradiating without any alleviating or exacerbating factors.  Patient also complains of severe lower back and rectal pain although this is no different than usual.  Patient denies fevers, sick contacts, nausea, vomiting, recent travel or confirmed contact with COVID-19.  Patient symptoms continue to worsen until he eventually presented to [facility] emergency department for evaluation.  Upon evaluation in the emergency department patient was found to have multiple SIRS criteria concerning for developing sepsis secondary to urinary tract infection.  Due to significant difficulty in exchanging patient's suprapubic catheter urology was called who came and changed at the bedside.  Patient was initiated on intravenous vancomycin, metronidazole and cefepime.  The hospitalist group was then called to assess the patient for admission the hospital.  Review of Systems: A 10-system review of systems  has been performed and all systems are negative with the exception of what is listed in the HPI.     Past Medical History:  Diagnosis Date  . Cancer ([place]) 2018   anal cancer  . Non Hodgkin's lymphoma ([place]) 1999  . S/P radiation therapy 07/07/14-7/20/216   anal ca     Past Surgical History:  Procedure Laterality Date  . COLOSTOMY  2020   diverting colostomy at [facility]  . ILEOSTOMY  2020   diverting colostomy, [employer]     reports that he has been smoking cigarettes. He has been smoking about 0.30 packs per day. He uses smokeless tobacco. He reports that he does not drink alcohol. No history on file for drug.  No Known Allergies  Family History  Problem Relation Age of Onset  . Stroke Mother      Prior to Admission medications   Medication Sig Start Date End Date Taking? Authorizing Provider  acetaminophen (TYLENOL) 325 MG tablet Take 650 mg by mouth every 6 (six) hours as needed for pain. 11/21/18  Yes [provider]  MELATONIN PO Take 1 capsule by mouth at bedtime as needed (sleep).    Yes [provider]  metroNIDAZOLE (METROGEL) 0.75 % gel Apply 1 application topically daily as needed (rosacea).  11/21/18  Yes [provider]  midodrine (PROAMATINE) 5 MG tablet TAKE 1 TABLET BY MOUTH 3 TIMES A DAY WITH MEALS Patient taking differently: Take 5 mg by mouth 3 (three) times daily with meals.  06/14/19  Yes [name], MD  Oxycodone HCl 20 MG TABS Take 1-1.5 tablets (20-30 mg total) by mouth See admin instructions. Take 1 tablet in the morning, take  1.5 tablets at lunch, and take 1 tablet at night Patient taking differently: Take 20-30 mg by mouth See admin instructions. Take 1 tablet (20mg ) by mouth in the morning, take 1.5 tablets (30mg ) by mouth at lunch, and take 1 tablet (20mg ) by mouth at night. 06/07/19  Yes [name], MD  ferrous sulfate 325 (65 FE) MG tablet Take 1 tablet (325 mg total) by mouth daily with breakfast. 04/15/19 05/21/19   [name], MD    Physical Exam: Vitals:   06/16/19 2245 06/16/19 2330 06/17/19 0000 06/17/19 0110  BP: 102/88 100/73 105/79 (!) 110/91  Pulse: (!) 112 (!) 109 (!) 120 (!) 113  Resp:  14  19  SpO2: 100% 100% 99% 100%  Weight:    54.4 kg  Height:    6\' 1"  (1.854 m)    Constitutional: Lethargic but arousable and oriented x3, patient is in acute pain.  Patient is cachectic. Skin: Extensive stage IV sacral wound with foul smell and somewhat purulent drainage.  Additionally, significantly macerated skin with purplish discoloration noted around the scrotum with some serosanguineous drainage coming from these areas.   extremely poor skin turgor noted otherwise. Eyes: Pupils are equally reactive to light.  No evidence of scleral icterus or conjunctival pallor.  ENMT: Extremely dry mucous membranes noted.  Posterior pharynx clear of any exudate or lesions.   Neck: normal, supple, no masses, no thyromegaly.  No evidence of jugular venous distension.   Respiratory: clear to auscultation bilaterally, no wheezing, no crackles. Normal respiratory effort. No accessory muscle use.  Cardiovascular: Regular rate and rhythm, no murmurs / rubs / gallops. No extremity edema. 2+ pedal pulses. No carotid bruits.  Chest:   Nontender without crepitus or deformity.   Back:   Nontender without crepitus or deformity. Abdomen: Notable lower abdominal tenderness.  Abdomen is soft.  Colostomy in place.  No evidence of intra-abdominal masses.  Positive bowel sounds noted in all quadrants.   GU: Suprapubic catheter in place draining cloudy urine. Musculoskeletal: Extremely poor muscle tone noted.  No joint deformity upper and lower extremities.  No contractures.   Neurologic: Patient is moving all 4 extremities spontaneously.  Patient is lethargic but arousable and oriented.  Sensation is grossly intact.  Patient is responsive to verbal and painful stimuli. Psychiatric: Patient presents as a press mood with flat  affect.  Patient does not seem to possess insight as to his current situation.   Labs on Admission: I have personally reviewed following labs and imaging studies -   CBC: Recent Labs  Lab 06/16/19 1928  WBC 13.7*  NEUTROABS 11.3*  HGB 8.1*  HCT 27.7*  MCV 67.6*  PLT 0000000*   Basic Metabolic Panel: Recent Labs  Lab 06/16/19 1928  NA 133*  K 4.2  CL 99  CO2 26  GLUCOSE 96  BUN 15  CREATININE 0.72  CALCIUM 11.1*   GFR: Estimated Creatinine Clearance: 84.1 mL/min (by C-G formula based on SCr of 0.72 mg/dL). Liver Function Tests: Recent Labs  Lab 06/16/19 1928  AST 11*  ALT 11  ALKPHOS 78  BILITOT 0.5  PROT 7.5  ALBUMIN 2.0*   No results for input(s): LIPASE, AMYLASE in the last 168 hours. No results for input(s): AMMONIA in the last 168 hours. Coagulation Profile: Recent Labs  Lab 06/16/19 1928  INR 1.2   Cardiac Enzymes: No results for input(s): CKTOTAL, CKMB, CKMBINDEX, TROPONINI in the last 168 hours. BNP (last 3 results) No results for input(s): PROBNP in  the last 8760 hours. HbA1C: No results for input(s): HGBA1C in the last 72 hours. CBG: No results for input(s): GLUCAP in the last 168 hours. Lipid Profile: No results for input(s): CHOL, HDL, LDLCALC, TRIG, CHOLHDL, LDLDIRECT in the last 72 hours. Thyroid Function Tests: No results for input(s): TSH, T4TOTAL, FREET4, T3FREE, THYROIDAB in the last 72 hours. Anemia Panel: No results for input(s): VITAMINB12, FOLATE, FERRITIN, TIBC, IRON, RETICCTPCT in the last 72 hours. Urine analysis:    Component Value Date/Time   COLORURINE STRAW (A) 06/16/2019 1846   APPEARANCEUR TURBID (A) 06/16/2019 1846   LABSPEC 1.022 06/16/2019 1846   PHURINE 7.0 06/16/2019 1846   GLUCOSEU NEGATIVE 06/16/2019 1846   HGBUR MODERATE (A) 06/16/2019 1846   BILIRUBINUR NEGATIVE 06/16/2019 1846   KETONESUR 5 (A) 06/16/2019 1846   PROTEINUR >=300 (A) 06/16/2019 1846   NITRITE POSITIVE (A) 06/16/2019 1846   LEUKOCYTESUR  MODERATE (A) 06/16/2019 1846    Radiological Exams on Admission - Personally Reviewed: DG Chest Port 1 View  Result Date: 06/16/2019 CLINICAL DATA:  Pt from home via EMS-Pt reports to EMS that pt increased weakness over the past several days. Pt has sacral wound that is malodorous. EXAM: PORTABLE CHEST 1 VIEW COMPARISON:  Chest radiograph 03/09/2019 FINDINGS: The cardiomediastinal contours are within normal limits. There are innumerable bilateral pulmonary nodules which have increased in size and number compared to the prior radiograph. No focal consolidation no pneumothorax or significant pleural effusion. No acute finding in the visualized skeleton. IMPRESSION: Innumerable bilateral pulmonary nodules which have increased in size and number compared to the prior radiograph, likely representing metastatic disease. Electronically Signed   By: Audie Pinto M.D.   On: 06/16/2019 19:33    Telemetry: Personally reviewed.  Sinus tachycardia.  No dynamic ST segment changes appreciated.  Assessment/Plan Active Problems:   Sepsis [facility])   Patient presenting with multiple sirs criteria including tachycardia, tachypnea and leukocytosis in the setting of both a likely complicated urinary tract infection, sacral wound infection and likely osteomyelitis based on clinical exam  Hydrating patient aggressively with intravenous isotonic fluids  Patient is been placed on extremely broad-spectrum IV antibiotic coverage including intravenous cefepime, metronidazole and vancomycin  Blood cultures have been obtained and are pending  Wound cultures have been obtained  CT imaging of abdomen and pelvis ordered to evaluate extent of infection and confirm suspected osteomyelitis of sacrum.  General surgery has been consulted in the past (01/2019) in particular for his extensive sacral wound.  They have stated that the patient is not a surgical candidate for any intervention at that time.  I do not believe that  their recommendations would change in the situation.  Monitoring patient closely as patient is at high risk of rapid clinical decompensation considering his extensive malignancy    Sacral wound   Extensive stage IV sacral wound, directly related to patient's advanced anal cancer  As mentioned above, patient is likely not a surgical candidate for any intervention  Wound care consultation ordered  For now I have ordered Betadine soaked gauze to be loosely packed into wound covered by ABD pads, secured with tape daily.    Osteomyelitis of sacrum ([place])   Please see notations above    Urinary tract infection associated with catheterization of urinary tract, initial encounter [facility])   Please see assessment and plan above    Metastatic anal cancer   Squamous cell metastatic anal cancer with involvement of bone and distant mets to the lungs  Patient has undergone  radiation therapy in the past with diverting colostomy and suprapubic catheter  No longer receiving treatment    Severe protein-calorie malnutrition ([place])  Extremely poor oral intake with low BMI and muscle wasting  Nutrition consult placed    Goals of care, counseling/discussion   Overall prognosis is extremely poor, I have discussed this with the patient.  Patient lives alone, has advanced metastatic cancer and continues to present with severe infectious complications.  Patient states that no one has ever mentioned hospice to him before although I find this unlikely -there are notes in the patient's chart from [place] care services as recently as February  Patient states he still wishes for all modalities of therapy at this time  Patient states he still wishes to be full code  We will place palliative care consultation for continued dialogue about goals of care and possible transition patient to palliative care and hospice services at time of discharge.      Code Status:  Full code Family  Communication: Deferred  Status is: Inpatient  Remains inpatient appropriate because:Ongoing active pain requiring inpatient pain management, Ongoing diagnostic testing needed not appropriate for outpatient work up, IV treatments appropriate due to intensity of illness or inability to take PO and Inpatient level of care appropriate due to severity of illness   Dispo: The patient is from: Home              Anticipated d/c is to: Home              Anticipated d/c date is: > 3 days              Patient currently is not medically stable to d/c.         Vernelle Emerald MD [facility] Pager [phone]  If 7PM-7AM, please contact night-coverage [email or web address] Use universal [place] password for that web site. If you do not have the password, please call the hospital operator.  06/17/2019, 1:16 AM

## 2019-06-17 NOTE — Plan of Care (Signed)
  Problem: Health Behavior/Discharge Planning: Goal: Ability to manage health-related needs will improve 06/17/2019 2331 by Betsy Pries, RN Outcome: Progressing 06/17/2019 2331 by Betsy Pries, RN Outcome: Progressing   Problem: Elimination: Goal: Will not experience complications related to urinary retention 06/17/2019 2331 by Betsy Pries, RN Outcome: Progressing 06/17/2019 2331 by Betsy Pries, RN Outcome: Progressing   Problem: Pain Managment: Goal: General experience of comfort will improve 06/17/2019 2331 by Betsy Pries, RN Outcome: Progressing 06/17/2019 2331 by Betsy Pries, RN Outcome: Progressing

## 2019-06-17 NOTE — Clinical Social Work Note (Signed)
Attempted to interview patient about dispositional needs.  He was asleep and did not awaken easily.  TOC will continue to follow during the course of hospitalization.

## 2019-06-17 NOTE — Progress Notes (Signed)
Pharmacy Antibiotic Note  Dylan [name] is a 49 y.o. male admitted on 06/16/2019 with sepsis.  Pharmacy has been consulted for cefepime and vancomycin dosing.  Plan: Cefepime 2 Gm IV q8h Vancomycin 1 Gm IV q12h for est AUC = 477 Use scr = 0.8 Goal AUC = 400-550 F/u scr/cultures/levels     No data recorded.  Recent Labs  Lab 06/16/19 1846 06/16/19 1928  WBC  --  13.7*  CREATININE  --  0.72  LATICACIDVEN 1.6  --     CrCl cannot be calculated (Unknown ideal weight.).    No Known Allergies  Antimicrobials this admission: 5/2 cefepime >>  5/2 flagyl >>  5/2 vancomycin >>  Dose adjustments this admission:   Microbiology results:  BCx:   UCx:    Sputum:    MRSA PCR:   Thank you for allowing pharmacy to be a part of this patient's care.  Dorrene German 06/17/2019 1:10 AM

## 2019-06-17 NOTE — Progress Notes (Signed)
Pt loss IV access on arrival to unit at 0200. IV team consult placed and IV access obtained at 0515. Bolus number 1/2 started.

## 2019-06-17 NOTE — Progress Notes (Signed)
Received call from lab that anaerobic culture wasn't obtained from ED.Marland Kitchen only aerobic swab. I contacted MD to ask if both were needed since I just saturated wound bed with betadine and packed. He stated to allow wound bed to start to drain again and recollect both aerobic and anaerobic swab later today when dressing change needed. Will communicate this with oncoming RN.

## 2019-06-17 NOTE — Progress Notes (Signed)
Patient continues to be in the yellow MEWS.  T99.0 BP 90/63 HR 106 RR 16 O2 100 % on Room air  Patient is A&O x4 and is in no distress.  Notified charge nurse Harvest Dark.

## 2019-06-17 NOTE — Progress Notes (Signed)
Pharmacy Antibiotic Note  Dylan [name] is a 49 y.o. male admitted on 06/16/2019 with sepsis.  Pharmacy has been consulted for vancomycin and cefepime dosing.  Pt has PMH significant for Hodgkin's lymphoma, metastatic squamous cell anal cancer presenting with weakness and decreased oral intake. Broad spectrum antibiotics started for sepsis, possible source: UTI and/or sacral wound with concern for OM.  Today, 06/17/19 -WBC 10.8 -SCr 0.72, CrCl ~84 mL/min -Afebrile  Plan:  Cefepime 2 g IV q8h  Metronidazole 500 mg IV q8h  Vancomycin 1000 mg LD followed by 750 mg IV q12h. Reduce dose for goal AUC 400-550  Follow renal function and culture data. Check vancomycin levels at steady state if indicated  Height: 6\' 1"  (185.4 cm) Weight: 54.4 kg (120 lb) IBW/kg (Calculated) : 79.9  Temp (24hrs), Avg:97.8 F (36.6 C), Min:97.6 F (36.4 C), Max:98 F (36.7 C)  Recent Labs  Lab 06/16/19 1846 06/16/19 1928 06/17/19 0939  WBC  --  13.7* 10.8*  CREATININE  --  0.72  --   LATICACIDVEN 1.6  --   --     Estimated Creatinine Clearance: 84.1 mL/min (by C-G formula based on SCr of 0.72 mg/dL).    No Known Allergies  Antimicrobials this admission: vancomycin 5/2 >>  cefepime 5/2 >>  Metronidazole 5/2 >>  Dose adjustments this admission:  Microbiology results: 5/2 BCx: Sent 5/2 UCx: Sent   Thank you for allowing pharmacy to be a part of this patient's care.  Lenis Noon, PharmD 06/17/2019 10:27 AM

## 2019-06-17 NOTE — Consult Note (Signed)
Urology Consult Note   Requesting Attending Physician:  Shelly Coss, MD Service Providing Consult: Urology  Consulting Attending: Dr. Link Snuffer, MD  Reason for Consult:   Suprapubic catheter exchange  HPI: Dylan [name] is seen in consultation for reasons noted above at the request of Shelly Coss, MD for evaluation of difficulty exchanging suprapubic catheter.  As previously noted: This is a 49 y.o. 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 This was placed at [facility] ~01/2019, next exchange was early April and was noted to require firm pressure to remove but was without bleeding and a 16Fr was replaced.  This is usually changed monthly with home health nurse.    Now presents for failure to thrive.  Multiple ED staff providers unable to remove the suprapubic tube.    Urology evaluated the patient bedside.  He had a existing 16 French suprapubic catheter in place.  The balloon was able to be deflated as well as deflated, all the existing fluid in the balloon was aspirated.  The tract appeared to deviate slightly inferiorly tracking through the subcutaneous space.  With mild caudal retraction on the skin to straighten the tract and from pressure, the suprapubic tube was removed without bleeding.  There was small amount of encrustation near the balloon.  16 French silicone catheter was easily replaced through the existing tract.  Irrigation confirmed appropriate position.  10 cc placed in balloon.  Past Medical History: Past Medical History:  Diagnosis Date  . Cancer ([place]) 2018   anal cancer  . Non Hodgkin's lymphoma ([place]) 1999  . S/P radiation therapy 07/07/14-7/20/216   anal ca     Past Surgical History:  Past Surgical History:  Procedure Laterality Date  . COLOSTOMY  2020   diverting colostomy at [facility]  . ILEOSTOMY  2020   diverting colostomy, [employer]    Medication: Current  Facility-Administered Medications  Medication Dose Route Frequency Provider Last Rate Last Admin  . acetaminophen (TYLENOL) tablet 650 mg  650 mg Oral Q6H PRN Shalhoub, Sherryll Burger, MD       Or  . acetaminophen (TYLENOL) suppository 650 mg  650 mg Rectal Q6H PRN Shalhoub, Sherryll Burger, MD      . ceFEPIme (MAXIPIME) 2 g in sodium chloride 0.9 % 100 mL IVPB  2 g Intravenous Q8H Dorrene German, RPH 200 mL/hr at 06/17/19 0643 2 g at 06/17/19 0643  . enoxaparin (LOVENOX) injection 40 mg  40 mg Subcutaneous Daily Shalhoub, Sherryll Burger, MD      . feeding supplement (ENSURE ENLIVE) (ENSURE ENLIVE) liquid 237 mL  237 mL Oral BID BM Shalhoub, Sherryll Burger, MD      . lactated ringers infusion   Intravenous Continuous Shalhoub, Sherryll Burger, MD      . metroNIDAZOLE (FLAGYL) IVPB 500 mg  500 mg Intravenous Q8H Shalhoub, Sherryll Burger, MD 100 mL/hr at 06/17/19 0528 500 mg at 06/17/19 0528  . midodrine (PROAMATINE) tablet 5 mg  5 mg Oral TID WC Shalhoub, Sherryll Burger, MD   5 mg at 06/17/19 0859  . nystatin (MYCOSTATIN/NYSTOP) topical powder   Topical BID Shalhoub, Sherryll Burger, MD      . ondansetron [facility]) tablet 4 mg  4 mg Oral Q6H PRN Shalhoub, Sherryll Burger, MD       Or  . ondansetron [facility]) injection 4 mg  4 mg Intravenous Q6H PRN Shalhoub, Sherryll Burger, MD      .  oxyCODONE (Oxy IR/ROXICODONE) immediate release tablet 20 mg  20 mg Oral Q6H PRN Vernelle Emerald, MD   20 mg at 06/17/19 0109  . vancomycin (VANCOCIN) IVPB 1000 mg/200 mL premix  1,000 mg Intravenous Q12H Dorrene German, RPH 200 mL/hr at 06/17/19 0729 1,000 mg at 06/17/19 [id number]    Allergies: No Known Allergies  Social History: Social History   Tobacco Use  . Smoking status: Current Some Day Smoker    Packs/day: 0.30    Types: Cigarettes  . Smokeless tobacco: Current User  Substance Use Topics  . Alcohol use: No  . Drug use: Not on file    Family History Family History  Problem Relation Age of Onset  . Stroke Mother     Objective   Vital signs in last  24 hours: BP (!) 87/57 (BP Location: Left Arm)   Pulse (!) 103   Temp 97.9 F (36.6 C) (Oral)   Resp 16   Ht 6\' 1"  (1.854 m)   Wt 54.4 kg   SpO2 100%   BMI 15.83 kg/m   Physical Exam General: Chronically ill-appearing.  Cachectic Abdomen: Discomfort with manipulation of suprapubic tube which she attributes to prior removal attempt.  Colostomy with stool in bag.  Suprapubic cutaneous site without erythema, bleeding, induration, pus.  Appears to be well cared for.    Most Recent Labs: Lab Results  Component Value Date   WBC 10.8 (H) 06/17/2019   HGB 7.2 (L) 06/17/2019   HCT 24.5 (L) 06/17/2019   PLT 541 (H) 06/17/2019    Lab Results  Component Value Date   NA 133 (L) 06/16/2019   K 4.2 06/16/2019   CL 99 06/16/2019   CO2 26 06/16/2019   BUN 15 06/16/2019   CREATININE 0.72 06/16/2019   CALCIUM 11.1 (H) 06/16/2019   MG 1.7 03/11/2019   PHOS 2.8 03/11/2019    Lab Results  Component Value Date   INR 1.2 06/16/2019   APTT 32 06/16/2019     IMAGING: DG Chest Port 1 View  Result Date: 06/16/2019 CLINICAL DATA:  Pt from home via EMS-Pt reports to EMS that pt increased weakness over the past several days. Pt has sacral wound that is malodorous. EXAM: PORTABLE CHEST 1 VIEW COMPARISON:  Chest radiograph 03/09/2019 FINDINGS: The cardiomediastinal contours are within normal limits. There are innumerable bilateral pulmonary nodules which have increased in size and number compared to the prior radiograph. No focal consolidation no pneumothorax or significant pleural effusion. No acute finding in the visualized skeleton. IMPRESSION: Innumerable bilateral pulmonary nodules which have increased in size and number compared to the prior radiograph, likely representing metastatic disease. Electronically Signed   By: Audie Pinto M.D.   On: 06/16/2019 19:33    ------  Assessment:  49 y.o. 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    37 French suprapubic catheter was removed with gentle pressure and ensuring that the suprapubic tube track with straight.  There was moderate encrustation and debris.  90 French Foley catheter was easily replaced via the existing tract.  Recommendations: -Management per primary team -Patient may have catheter exchanges with home health nursing, facility nurse, other healthcare provider.  If he desires urologic care, he can have this where he has previously been seen or we are happy to coordinate [facility] as  well.    -Would recommend next exchange in approximately 1 month  Urology will sign off at this time.  Thank you for this consult. Please contact the urology consult pager with any further questions/concerns.

## 2019-06-17 NOTE — Progress Notes (Signed)
Patient is a 49 year old male with history of Hodgkin's lymphoma, metastatic squamous cell anal carcinoma which was diagnosed in 2018, status post radiation therapy, diverting colostomy, suprapubic cath , history of rectourethral fistula who presents to emergency department complaints of generalized weakness, poor oral intake.  He reported that his urine was cloudy and foul-smelling.  Presented with abdominal pain, rectal pain. Suprapubic catheter was changed by urology at bedside.  Started on broad-spectrum antibiotics for possibility of sepsis. He met sepsis criteria on presentation including tachycardia, tachypnea, leukocytosis.  Started on IV fluids.  Cultures have been sent and pending. He has extensive stage IV sacral wound which is directly associated with advanced anal cancer.  He was seen by general surgery in the past for the evaluation of his sacral wound and it was decided that he is not a surgical candidate.  Wound care has been ordered. Palliative care has been consulted given his extremely poor prognosis, multiple comorbidities. Patient seen and examined at the bedside this morning.  He says he feels better today.  His blood pressure was soft but stable.  Currently afebrile.  Denies any specific complaints. He he has a foul-smelling stage IV sacral wound which was examined at the bedside. Patient was seen by Dr. Cyd Silence this morning. I agree with his assessment and plan.

## 2019-06-18 DIAGNOSIS — C210 Malignant neoplasm of anus, unspecified: Secondary | ICD-10-CM

## 2019-06-18 DIAGNOSIS — Z515 Encounter for palliative care: Secondary | ICD-10-CM

## 2019-06-18 DIAGNOSIS — Z7189 Other specified counseling: Secondary | ICD-10-CM

## 2019-06-18 LAB — CBC WITH DIFFERENTIAL/PLATELET
Abs Immature Granulocytes: 0.05 10*3/uL (ref 0.00–0.07)
Basophils Absolute: 0 10*3/uL (ref 0.0–0.1)
Basophils Relative: 0 %
Eosinophils Absolute: 0 10*3/uL (ref 0.0–0.5)
Eosinophils Relative: 0 %
HCT: 23.7 % — ABNORMAL LOW (ref 39.0–52.0)
Hemoglobin: 6.7 g/dL — CL (ref 13.0–17.0)
Immature Granulocytes: 1 %
Lymphocytes Relative: 9 %
Lymphs Abs: 0.7 10*3/uL (ref 0.7–4.0)
MCH: 19.8 pg — ABNORMAL LOW (ref 26.0–34.0)
MCHC: 28.3 g/dL — ABNORMAL LOW (ref 30.0–36.0)
MCV: 70.1 fL — ABNORMAL LOW (ref 80.0–100.0)
Monocytes Absolute: 0.1 10*3/uL (ref 0.1–1.0)
Monocytes Relative: 1 %
Neutro Abs: 7.6 10*3/uL (ref 1.7–7.7)
Neutrophils Relative %: 89 %
Platelets: 372 10*3/uL (ref 150–400)
RBC: 3.38 MIL/uL — ABNORMAL LOW (ref 4.22–5.81)
RDW: 20.9 % — ABNORMAL HIGH (ref 11.5–15.5)
WBC: 8.4 10*3/uL (ref 4.0–10.5)
nRBC: 0 % (ref 0.0–0.2)

## 2019-06-18 LAB — COMPREHENSIVE METABOLIC PANEL
ALT: 11 U/L (ref 0–44)
AST: 17 U/L (ref 15–41)
Albumin: 1.8 g/dL — ABNORMAL LOW (ref 3.5–5.0)
Alkaline Phosphatase: 57 U/L (ref 38–126)
Anion gap: 7 (ref 5–15)
BUN: 13 mg/dL (ref 6–20)
CO2: 21 mmol/L — ABNORMAL LOW (ref 22–32)
Calcium: 9.7 mg/dL (ref 8.9–10.3)
Chloride: 102 mmol/L (ref 98–111)
Creatinine, Ser: 0.55 mg/dL — ABNORMAL LOW (ref 0.61–1.24)
GFR calc Af Amer: >60 mL/min (ref >60)
GFR calc non Af Amer: >60 mL/min (ref >60)
Glucose, Bld: 92 mg/dL (ref 70–99)
Potassium: 4.2 mmol/L (ref 3.5–5.1)
Sodium: 130 mmol/L — ABNORMAL LOW (ref 135–145)
Total Bilirubin: 0.5 mg/dL (ref 0.3–1.2)
Total Protein: 6.1 g/dL — ABNORMAL LOW (ref 6.5–8.1)

## 2019-06-18 LAB — PREPARE RBC (CROSSMATCH)

## 2019-06-18 LAB — MAGNESIUM: Magnesium: 1.9 mg/dL (ref 1.7–2.4)

## 2019-06-18 LAB — HEMOGLOBIN AND HEMATOCRIT, BLOOD
HCT: 27.7 % — ABNORMAL LOW (ref 39.0–52.0)
Hemoglobin: 8.6 g/dL — ABNORMAL LOW (ref 13.0–17.0)

## 2019-06-18 MED ORDER — SODIUM CHLORIDE 0.9 % IV BOLUS
500.0000 mL | Freq: Once | INTRAVENOUS | Status: AC
  Administered 2019-06-18: 500 mL via INTRAVENOUS

## 2019-06-18 MED ORDER — PIPERACILLIN-TAZOBACTAM 3.375 G IVPB
3.3750 g | Freq: Three times a day (TID) | INTRAVENOUS | Status: DC
  Administered 2019-06-18 – 2019-06-21 (×9): 3.375 g via INTRAVENOUS
  Filled 2019-06-18 (×12): qty 50

## 2019-06-18 MED ORDER — SODIUM CHLORIDE 0.9% IV SOLUTION: Freq: Once | INTRAVENOUS | Status: AC

## 2019-06-18 MED ORDER — METOCLOPRAMIDE HCL 5 MG PO TABS
5.0000 mg | ORAL_TABLET | Freq: Three times a day (TID) | ORAL | Status: DC
  Administered 2019-06-19 – 2019-06-21 (×7): 5 mg via ORAL
  Filled 2019-06-18 (×8): qty 1

## 2019-06-18 MED ORDER — SODIUM CHLORIDE 0.9 % IV SOLN: INTRAVENOUS | Status: DC

## 2019-06-18 MED ORDER — PROSIGHT PO TABS
1.0000 | ORAL_TABLET | Freq: Every day | ORAL | Status: DC
  Administered 2019-06-18 – 2019-06-21 (×4): 1 via ORAL
  Filled 2019-06-18 (×4): qty 1

## 2019-06-18 MED ORDER — ENSURE ENLIVE PO LIQD
237.0000 mL | Freq: Three times a day (TID) | ORAL | Status: DC
  Administered 2019-06-18 – 2019-06-21 (×5): 237 mL via ORAL

## 2019-06-18 MED ORDER — MIDODRINE HCL 5 MG PO TABS
10.0000 mg | ORAL_TABLET | Freq: Three times a day (TID) | ORAL | Status: DC
  Administered 2019-06-18 – 2019-06-21 (×12): 10 mg via ORAL
  Filled 2019-06-18 (×13): qty 2

## 2019-06-18 MED ORDER — OCUVITE-LUTEIN PO CAPS
1.0000 | ORAL_CAPSULE | Freq: Every day | ORAL | Status: DC
  Filled 2019-06-18: qty 1

## 2019-06-18 MED ORDER — JUVEN PO PACK
1.0000 | PACK | Freq: Two times a day (BID) | ORAL | Status: DC
  Administered 2019-06-18 – 2019-06-20 (×3): 1 via ORAL
  Filled 2019-06-18 (×7): qty 1

## 2019-06-18 NOTE — Progress Notes (Signed)
CRITICAL VALUE ALERT  Critical Value:  Hgb = 6.7  Date & Time Notied:  05/04/210@ 0620  Provider Notified: Shea Evans text page sent to [facility]   Orders Received/Actions taken: pending

## 2019-06-18 NOTE — Progress Notes (Addendum)
Pharmacy Antibiotic Note  Dylan Dylan [name] is a 49 y.o. male admitted on 06/16/2019 with sepsis.  Pharmacy has been consulted for vancomycin and piperacillin/tazobactam dosing.  Pt has PMH significant for Hodgkin's lymphoma, metastatic squamous cell anal cancer presenting with weakness and decreased oral intake. Broad spectrum antibiotics started for sepsis, possible source: stage IV sacral wound with concern for OM.  Today, 06/18/19 -WBC WNL -SCr WNL, CrCl ~80 mL/min -Afebrile  This is day #2 of full dose IV antibiotics.   MD changing antibiotics from vancomycin/cefepime/metronidazole to vancomycin + piperacillin/tazobactam.   Plan:  Piperacillin/tazobactam 3.375 g IV q8h EI  Continue vancomycin 750 mg IV q12h.    Goal AUC 400-550  Follow renal function and culture data. Check vancomycin levels at steady state if indicated  Height: 6\' 1"  (185.4 cm) Weight: 54.4 kg (120 lb) IBW/kg (Calculated) : 79.9  Temp (24hrs), Avg:98.1 F (36.7 C), Min:97.7 F (36.5 C), Dylan [name]:99 F (37.2 C)  Recent Labs  Lab 06/16/19 1846 06/16/19 1928 06/17/19 0939 06/18/19 0542  WBC  --  13.7* 10.8* 8.4  CREATININE  --  0.72  --  0.55*  LATICACIDVEN 1.6  --   --   --     Estimated Creatinine Clearance: 84.1 mL/min (A) (by C-G formula based on SCr of 0.55 mg/dL (L)).    No Known Allergies  Antimicrobials this admission: vancomycin 5/2 >>  Piperacillin/tazobactam 5/4 >> cefepime 5/2 >> 5/4  Metronidazole 5/2 >> 5/4  Dose adjustments this admission: 5/3 vanc 1 g IV q12h --> 750 mg IV q12h  Microbiology results: 5/3 Urine: multiple species present, suggest recollection 5/3 BCx: ngtd 5/3 sacral wound: pending  Lenis Noon, PharmD 06/18/2019 10:12 AM

## 2019-06-18 NOTE — Progress Notes (Signed)
PROGRESS NOTE    Dylan [name]  ZOX:[id number] DOB: [birth year] DOA: 06/16/2019 PCP: [name], MD   Brief Narrative: Patient is a 49 year old male with history of Hodgkin's lymphoma, metastatic squamous cell anal carcinoma which was diagnosed in 2018, status post radiation therapy, diverting colostomy, suprapubic cath , history of rectourethral fistula who presents to emergency department complaints of generalized weakness, poor oral intake.  He reported that his urine was cloudy and foul-smelling.  Presented with abdominal pain, rectal pain. Suprapubic catheter was changed by urology at bedside.  Started on broad-spectrum antibiotics for possibility of sepsis. He met sepsis criteria on presentation including tachycardia, tachypnea, leukocytosis.  Started on IV fluids.  Cultures have been sent and pending. He has extensive stage IV sacral wound which is directly associated with advanced anal cancer.  He was seen by general surgery in the past for the evaluation of his sacral wound and it was decided that he is not a surgical candidate.  Wound care has been ordered. Palliative care has been consulted given his extremely poor prognosis, multiple comorbidities. BP remains soft.  Assessment & Plan:   Active Problems:   Metastatic anal cancer   Sepsis ([place])   Severe protein-calorie malnutrition ([place])   Sacral wound   Osteomyelitis of sacrum ([facility])   Urinary tract infection associated with catheterization of urinary tract, initial encounter ([place])   Goals of care, counseling/discussion   Septic shock due to worsening chronic sacral wound, present on admission: Presented with tachycardia, tachypnea, leukocytosis.  Cultures have been sent and pending.  Currently on broad-spectrum antibiotics with vancomycin, Flagyl and cefepime. Will change antibiotics to vancomycin and Zosyn.  His blood pressure is chronically low.  Midodrine will be increased to 10 mg 3 times daily.  Also continue stress  dose hydrocortisone.  Currently afebrile.  Acute on chronic normocytic anemia: From chronic blood loss from the chronic sacral/anal wound  and failure to thrive.  Hemoglobin dropped to the range of 6 today.  Being transfused with 1 unit of PRBC. Check CBC tomorrow.  Failure to thrive: Severe protein calorie malnutrition in the setting of metastatic anal cancer.  Nutrition consult  Infected anal wound:Has necrotic, foul-smelling rectal/anal wound.General surgery was  consulted on earlier admission  but as per them there is no indication for surgical intervention. Recommended continued wound care therapy . He had several surgeries done in the past in different hospitals. He is a status post colostomy and suprapubic catheter placement.Changed suprapubic catheter here.Urology was following.  Metastatic anal cancer: Squamous cell metastatic anal cancer with involvement of the bone and distant mets to the lungs.  Status post radiation therapy, surgery. Status post diverting colostomy on 01/17/2019 at [facility] and radiation therapy in the past.Also had suprapubic catheter placement.  He has history of noncompliance. Follows with oncology.  No longer on treatment.  Goals of care /advanced metastatic cancer: Palliative care consulted this time and was following in the past. Remains full code.   Generalized weakness: Physical therapy consulted.         DVT prophylaxis:Lovenox Code Status: Full Family Communication: Brother on phone on 06/18/2019   status is: Inpatient  Remains inpatient appropriate because:Inpatient level of care appropriate due to severity of illness   Dispo: The patient is from: Home              Anticipated d/c is to: Home              Anticipated d/c date is: 2 days  Patient currently is not medically stable to d/c.   Consultants: palliative care  Procedures:None  Antimicrobials:  Anti-infectives (From admission, onward)   Start      Dose/Rate Route Frequency Ordered Stop   06/17/19 2200  vancomycin (VANCOREADY) IVPB 750 mg/150 mL     750 mg 150 mL/hr over 60 Minutes Intravenous Every 12 hours 06/17/19 1026     06/17/19 0600  vancomycin (VANCOCIN) IVPB 1000 mg/200 mL premix  Status:  Discontinued     1,000 mg 200 mL/hr over 60 Minutes Intravenous Every 12 hours 06/17/19 0246 06/17/19 1026   06/17/19 0400  metroNIDAZOLE (FLAGYL) IVPB 500 mg     500 mg 100 mL/hr over 60 Minutes Intravenous Every 8 hours 06/17/19 0106     06/17/19 0400  ceFEPIme (MAXIPIME) 2 g in sodium chloride 0.9 % 100 mL IVPB     2 g 200 mL/hr over 30 Minutes Intravenous Every 8 hours 06/17/19 0246     06/17/19 0115  ceFEPIme (MAXIPIME) 2 g in sodium chloride 0.9 % 100 mL IVPB  Status:  Discontinued     2 g 200 mL/hr over 30 Minutes Intravenous  Once 06/17/19 0106 06/17/19 0246   06/16/19 1900  ceFEPIme (MAXIPIME) 2 g in sodium chloride 0.9 % 100 mL IVPB     2 g 200 mL/hr over 30 Minutes Intravenous  Once 06/16/19 1849 06/16/19 2041   06/16/19 1900  metroNIDAZOLE (FLAGYL) IVPB 500 mg     500 mg 100 mL/hr over 60 Minutes Intravenous  Once 06/16/19 1849 06/16/19 2102   06/16/19 1900  vancomycin (VANCOCIN) IVPB 1000 mg/200 mL premix     1,000 mg 200 mL/hr over 60 Minutes Intravenous  Once 06/16/19 1849 06/16/19 2221      Subjective: Patient seen and examined at the bedside this morning.  Complains of generalized weakness, dizziness otherwiseno  specific complaints.  BP remains soft.  Remains alert and oriented.  Denies any new complaints.  Long discussion done at the bedside about goals of care.  Also talked with the brother on phone  Objective: Vitals:   06/18/19 0116 06/18/19 0121 06/18/19 0639 06/18/19 0651  BP: (!) 82/51 (!) 82/56 (!) 76/53 (!) 80/56  Pulse: 94  94   Resp:   16   Temp: 97.7 F (36.5 C)  97.7 F (36.5 C)   TempSrc: Oral     SpO2: 99%  100%   Weight:      Height:        Intake/Output Summary (Last 24 hours) at  06/18/2019 0726 Last data filed at 06/18/2019 0502 Gross per 24 hour  Intake 1042.23 ml  Output 750 ml  Net 292.23 ml   Filed Weights   06/17/19 0110  Weight: 54.4 kg    Examination:  General exam: weak,Not in distress, cachectic, extremely malnourished HEENT:PERRL,Oral mucosa moist, Ear/Nose normal on gross exam Respiratory system: Bilateral equal air entry, normal vesicular breath sounds, no wheezes or crackles  Cardiovascular system: S1 & S2 heard, RRR. No JVD, murmurs, rubs, gallops or clicks. No pedal edema. Gastrointestinal system: Abdomen is nondistended, soft and nontender. No organomegaly or masses felt. Normal bowel sounds heard. Central nervous system: Alert and oriented. No focal neurological deficits. Extremities: No edema, no clubbing ,no cyanosis, distal peripheral pulses palpable. Skin: Stage IV sacral ulcer, anal ulcer    Data Reviewed: I have personally reviewed following labs and imaging studies  CBC: Recent Labs  Lab 06/16/19 1928 06/17/19 0939 06/18/19 0542  WBC 13.7* 10.8*  8.4  NEUTROABS 11.3* 8.8* 7.6  HGB 8.1* 7.2* 6.7*  HCT 27.7* 24.5* 23.7*  MCV 67.6* 67.7* 70.1*  PLT 609* 541* 160   Basic Metabolic Panel: Recent Labs  Lab 06/16/19 1928 06/18/19 0542  NA 133* 130*  K 4.2 4.2  CL 99 102  CO2 26 21*  GLUCOSE 96 92  BUN 15 13  CREATININE 0.72 0.55*  CALCIUM 11.1* 9.7  MG  --  1.9   GFR: Estimated Creatinine Clearance: 84.1 mL/min (A) (by C-G formula based on SCr of 0.55 mg/dL (L)). Liver Function Tests: Recent Labs  Lab 06/16/19 1928 06/18/19 0542  AST 11* 17  ALT 11 11  ALKPHOS 78 57  BILITOT 0.5 0.5  PROT 7.5 6.1*  ALBUMIN 2.0* 1.8*   No results for input(s): LIPASE, AMYLASE in the last 168 hours. No results for input(s): AMMONIA in the last 168 hours. Coagulation Profile: Recent Labs  Lab 06/16/19 1928  INR 1.2   Cardiac Enzymes: No results for input(s): CKTOTAL, CKMB, CKMBINDEX, TROPONINI in the last 168 hours. BNP  (last 3 results) No results for input(s): PROBNP in the last 8760 hours. HbA1C: No results for input(s): HGBA1C in the last 72 hours. CBG: No results for input(s): GLUCAP in the last 168 hours. Lipid Profile: No results for input(s): CHOL, HDL, LDLCALC, TRIG, CHOLHDL, LDLDIRECT in the last 72 hours. Thyroid Function Tests: No results for input(s): TSH, T4TOTAL, FREET4, T3FREE, THYROIDAB in the last 72 hours. Anemia Panel: Recent Labs    06/17/19 0939  FERRITIN 24  TIBC 165*  IRON 13*   Sepsis Labs: Recent Labs  Lab 06/16/19 1846 06/16/19 1944  PROCALCITON  --  0.15  LATICACIDVEN 1.6  --     Recent Results (from the past 240 hour(s))  Urine culture     Status: Abnormal   Collection Time: 06/16/19  6:46 PM   Specimen: In/Out Cath Urine  Result Value Ref Range Status   Specimen Description   Final    IN/OUT CATH URINE Performed at [facility], [place] [address], [place], [place] [address]    Special Requests   Final    NONE Performed at [facility], [place] [address] [place], [place] [address]    Culture MULTIPLE SPECIES PRESENT, SUGGEST RECOLLECTION (A)  Final   Report Status 06/17/2019 FINAL  Final  Blood Culture (routine x 2)     Status: None (Preliminary result)   Collection Time: 06/16/19  6:51 PM   Specimen: BLOOD  Result Value Ref Range Status   Specimen Description   Final    BLOOD RIGHT ARM Performed at [facility] [address], [place], [place] [address]    Special Requests   Final    BOTTLES DRAWN AEROBIC AND ANAEROBIC Blood Culture adequate volume Performed at [facility] [address], [place], [place] [address]    Culture   Final    NO GROWTH 1 DAY Performed at [facility], [place] [address], [place], [place] [address]    Report Status PENDING  Incomplete  Blood Culture (routine x 2)     Status: None (Preliminary result)   Collection Time: 06/16/19  7:28 PM    Specimen: BLOOD RIGHT FOREARM  Result Value Ref Range Status   Specimen Description   Final    BLOOD RIGHT FOREARM Performed at [facility] [address] [place], [place] [address]    Special Requests   Final  BOTTLES DRAWN AEROBIC AND ANAEROBIC Blood Culture adequate volume Performed at [facility] [address], [place], [place] [address]    Culture   Final    NO GROWTH 1 DAY Performed at [facility], [place] [address] [place], [place] [address]    Report Status PENDING  Incomplete  Respiratory Panel by RT PCR (Flu A&B, Covid) - Nasopharyngeal Swab     Status: None   Collection Time: 06/16/19 10:06 PM   Specimen: Nasopharyngeal Swab  Result Value Ref Range Status   SARS Coronavirus 2 by RT PCR NEGATIVE NEGATIVE Final    Comment: (NOTE) SARS-CoV-2 target nucleic acids are NOT DETECTED. The SARS-CoV-2 RNA is generally detectable in upper respiratoy specimens during the acute phase of infection. The lowest concentration of SARS-CoV-2 viral copies this assay can detect is 131 copies/mL. A negative result does not preclude SARS-Cov-2 infection and should not be used as the sole basis for treatment or other patient management decisions. A negative result may occur with  improper specimen collection/handling, submission of specimen other than nasopharyngeal swab, presence of viral mutation(s) within the areas targeted by this assay, and inadequate number of viral copies (<131 copies/mL). A negative result must be combined with clinical observations, patient history, and epidemiological information. The expected result is Negative. Fact Sheet for Patients:  PinkCheek.be Fact Sheet for Healthcare Providers:  GravelBags.it This test is not yet ap proved or cleared by the [place] FDA and  has been authorized for detection and/or diagnosis of SARS-CoV-2 by FDA under an  Emergency Use Authorization (EUA). This EUA will remain  in effect (meaning this test can be used) for the duration of the COVID-19 declaration under Section 564(b)(1) of the Act, 21 U.S.C. section 360bbb-3(b)(1), unless the authorization is terminated or revoked sooner.    Influenza A by PCR NEGATIVE NEGATIVE Final   Influenza B by PCR NEGATIVE NEGATIVE Final    Comment: (NOTE) The Xpert Xpress SARS-CoV-2/FLU/RSV assay is intended as an aid in  the diagnosis of influenza from Nasopharyngeal swab specimens and  should not be used as a sole basis for treatment. Nasal washings and  aspirates are unacceptable for Xpert Xpress SARS-CoV-2/FLU/RSV  testing. Fact Sheet for Patients: PinkCheek.be Fact Sheet for Healthcare Providers: GravelBags.it This test is not yet approved or cleared by the [place] FDA and  has been authorized for detection and/or diagnosis of SARS-CoV-2 by  FDA under an Emergency Use Authorization (EUA). This EUA will remain  in effect (meaning this test can be used) for the duration of the  Covid-19 declaration under Section 564(b)(1) of the Act, 21  U.S.C. section 360bbb-3(b)(1), unless the authorization is  terminated or revoked. Performed at [facility], [place] [address], [place], [place] [address]   Aerobic/Anaerobic Culture (surgical/deep wound)     Status: None (Preliminary result)   Collection Time: 06/17/19  7:42 AM   Specimen: Wound  Result Value Ref Range Status   Specimen Description   Final    WOUND SACRAL Performed at [facility] [address] [place], [place] [address]    Special Requests   Final    NONE Performed at [facility], [place] [address], [place], [place] [address]    Gram Stain   Final    NO WBC SEEN NO ORGANISMS SEEN Performed at [facility], [place] [address] [place],  [address]    Culture PENDING   Incomplete   Report Status  PENDING  Incomplete         Radiology Studies: CT ABDOMEN PELVIS W CONTRAST  Result Date: 06/17/2019 CLINICAL DATA:  Sepsis with extensive sacral wound, evaluate for osteomyelitis. EXAM: CT ABDOMEN AND PELVIS WITH CONTRAST TECHNIQUE: Multidetector CT imaging of the abdomen and pelvis was performed using the standard protocol following bolus administration of intravenous contrast. CONTRAST:  184m OMNIPAQUE IOHEXOL 300 MG/ML  SOLN COMPARISON:  PET CT 03/22/2019 FINDINGS: Lower chest: Numerous pulmonary nodules compatible with metastatic disease. When compared to PET-CT 03/22/2019 there has been growth. The dominant nodule at the right base measures 11 mm compared to 8 mm previously. Hepatobiliary: No focal liver abnormality.No evidence of biliary obstruction or stone. Pancreas: Unremarkable. Spleen: Unremarkable. Adrenals/Urinary Tract: Negative adrenals. No hydronephrosis or stone. Mildly heterogeneous perfusion of the right kidney, nonspecific and not convincing for pyelonephritis. Chronic thick walled bladder with no distinct plane between it and the pelvic tumor. There is a suprapubic catheter in good position. Masslike appearance in the bladder encompassing an eccentric curvilinear high-density structure, question sacrificed balloon which has collapsed. The adjacent material could be tumor or debris. Overall mass dimensions are 5 cm. Stomach/Bowel: History of anal carcinoma with deep excavated wound lined by nodular enhancing tumor which is excavated the lower pelvis. The anus has been resected or eroded. There is invasion into both sides of the pubic bones and ischial tuberosities. Tumor involves the bladder base and remaining prostate. Descending colostomy which is patent. Vascular/Lymphatic: No acute vascular finding. Compared to prior PET-CT there is stable iliac lymph nodes measuring up to 15 mm short axis on the left. Reproductive:Erosion of the posterior urethra and  lower prostate from tumor. Other: No ascites or pneumoperitoneum. Musculoskeletal: Extensive bony erosion is noted above. No superimposed inflammation is seen to implicate superimposed osteomyelitis. There is thickening of the right more than left sacral plexus compatible with perineural tumor spread. A new deposit is seen in the right intrinsic back muscles, measuring 15 mm. There is also a new and not clearly contiguous tumor deposit in the left lower gluteal musculature. IMPRESSION: 1. Deeply excavated anal carcinoma involving the pelvic viscera and bilateral bony pelvis. To the clinical question, no evidence of superimposed osteomyelitis or soft tissue infection. There has been progression of metastatic disease with new soft tissue deposit and mildly enlarging pulmonary nodules. There is also evidence of perineural tumor spread at the bilateral sacral plexus. 2. Masslike abnormality with high-density component in the bladder which was not seen on 03/22/2019 study. Has there been a sacrificed suprapubic catheter since prior? Electronically Signed   By: JMonte FantasiaM.D.   On: 06/17/2019 11:57   DG Chest Port 1 View  Result Date: 06/16/2019 CLINICAL DATA:  Pt from home via EMS-Pt reports to EMS that pt increased weakness over the past several days. Pt has sacral wound that is malodorous. EXAM: PORTABLE CHEST 1 VIEW COMPARISON:  Chest radiograph 03/09/2019 FINDINGS: The cardiomediastinal contours are within normal limits. There are innumerable bilateral pulmonary nodules which have increased in size and number compared to the prior radiograph. No focal consolidation no pneumothorax or significant pleural effusion. No acute finding in the visualized skeleton. IMPRESSION: Innumerable bilateral pulmonary nodules which have increased in size and number compared to the prior radiograph, likely representing metastatic disease. Electronically Signed   By: NAudie PintoM.D.   On: 06/16/2019 19:33         Scheduled Meds: . sodium chloride   Intravenous Once  . collagenase   Topical Daily  .  enoxaparin (LOVENOX) injection  40 mg Subcutaneous Daily  . feeding supplement (ENSURE ENLIVE)  237 mL Oral BID BM  . hydrocortisone sod succinate (SOLU-CORTEF) inj  100 mg Intravenous Q8H  . midodrine  10 mg Oral TID WC  . nystatin   Topical BID   Continuous Infusions: . ceFEPime (MAXIPIME) IV 2 g (06/18/19 0502)  . metronidazole 500 mg (06/18/19 0534)  . vancomycin 750 mg (06/17/19 2358)     LOS: 1 day    Time spent: 25 mins,More than 50% of that time was spent in counseling and/or coordination of care.      Shelly Coss, MD [facility] P5/05/2019, 7:26 AM

## 2019-06-18 NOTE — Progress Notes (Signed)
MD notified for manual BP reading

## 2019-06-18 NOTE — Progress Notes (Signed)
PT Cancellation Note  Patient Details Name: Dylan [name] MRN: [id number] DOB: 1970-07-24   Cancelled Treatment:    Reason Eval/Treat Not Completed: Medical issues which prohibited therapy-low BP and low Hgb. Will hold PT for now. Will check back another day.   Doreatha Massed, PT Acute Rehabilitation

## 2019-06-18 NOTE — Consult Note (Signed)
Palliative Care Consult Note  Reason for Consult:  Goals of care in light of advance anal cancer  Palliative care consult received.  Chart reviewed including personal review of pertinent labs and imaging.  I met today with Dylan [name].    I introduced palliative care as specialized medical care for people living with serious illness. It focuses on providing relief from the symptoms and stress of a serious illness. The goal is to improve quality of life for both the patient and the family.  He is quiet at beginning of encounter but does become more engaged with further conversation.  We discussed prior encounters with palliative care as well as his evaluations by multiple oncology professionals.  We discussed his clinical course this admission as well as wishes moving forward in regard to care this hospitalization.  Values and goals of care important to patient and family were attempted to be elicited.  He reports that his family, including 4 children and his grandchildren are the most important things to him.  He states that he feels that he is told multiple things by different providers, however, he is clear that he is hopeful for improvement enough to be considered for immunotherapy.  I have offered to reach out to Dr. Dorsey to touch base as he missed appointment with him yesterday.  Questions and concerns addressed.   PMT will continue to support holistically.  Recommendations: -Dylan [name] reports that his primary concern is that he has poor appetite with early satiety/nausea.  We discussed plan for trial of metoclopramide, which he would like to trial.  Plan to begin reglan 5mg prior to meals. -He reports that he would like for his youngest daughter, Dylan [name], to serve as his surrogate decision maker if he is unable to make his own decisions.  He has not completed prior HCPOA paperwork per his report.  I placed a consult to spiritual care to facilitate. -Dylan [name] wishes to continue any and  all aggressive interventions.  He is clear in desire for all offered care including maintaining his FULL CODE status.  He reports being told that there is "nothing to do" but he is hopeful that he can improve his nutrition and functional status for consideration for immunotherapy.  He states that he was supposed to follow-up with Dr. Dorsey yesterday, but he missed that appointment as he came to the ED.  He would like for me to reach out to Dr. Dorsey for his input on his situation. -Will plan to f/u again tomorrow.  Time in: 0920 Time out: 1020 Total time: 60 minutes  Greater than 50%  of this time was spent counseling and coordinating care related to the above assessment and plan.  Gene Freeman, MD [facility] Team [phone]  

## 2019-06-18 NOTE — Progress Notes (Signed)
Initial Nutrition Assessment  DOCUMENTATION CODES:   Severe malnutrition in context of chronic illness  INTERVENTION:  -Increase Ensure Enlive po TID, each supplement provides 350 kcal and 20 grams of protein -Magic cup BID with meals, each supplement provides 290 kcal and 9 grams of protein -1 packet Juven BID, each packet provides 95 calories, 2.5 grams of protein (collagen), and 9.8 grams of carbohydrate (3 grams sugar); also contains 7 grams of L-arginine and L-glutamine, 300 mg vitamin C, 15 mg vitamin E, 1.2 mcg vitamin B-12, 9.5 mg zinc, 200 mg calcium, and 1.5 g  Calcium Beta-hydroxy-Beta-methylbutyrate to support wound healing -Ocuvite po daily, MVI provides 200 mg vit C, 40 mg zinc, 55 mcg selenium, 2 mg copper, 2 mg lutein to support wound healing   NUTRITION DIAGNOSIS:   Severe Malnutrition related to chronic illness(metastatic squamous cell anal carcinoma) as evidenced by percent weight loss, severe fat depletion, severe muscle depletion.   GOAL:   Patient will meet greater than or equal to 90% of their needs    MONITOR:   PO intake, Supplement acceptance, Labs, Weight trends, I & O's, Skin  REASON FOR ASSESSMENT:   Malnutrition Screening Tool, Consult Wound healing, Poor PO  ASSESSMENT:  49 year old male with past medical history of Hodgkin's lymphoma, metastatic squamous cell anal carcinoma (dx 2018 s/p radiation therapy, diverting colostomy and suprapubic cath currently not receiving treatment) presented with complaints of generalized weakness, poor oral intake, and reports urine output from catheter has been extremely cloudy and foul-smelling over the past week.  Patient admitted on 5/3 for sepsis likely secondary to complicated UTI and stage IV sacral wound associated with advanced anal cancer.   Patient turned on side and sleeping soundly this afternoon, did not answer to name call x 2. No family at bedside. Noted opened Ensure and pudding cup on bedside tray,  lunch tray untouched on counter. He is on a regular diet, per flowsheets he ate 0% of dinner last night. Patient is provided Ensure BID per protocol, and accepting of supplement this morning. Will increase supplement to TID and provide Magic Cup on lunch and dinner trays to aid with meeting needs. Will order Juven and MVI to support wound healing.  Current wt 119.68 lbs Weight history reviewed, on 12/19/18 pt weighed 139.7 lbs, on 02/07/19 pt weighed 125.62 lbs, on 03/13/19 pt weighed 139.92 lbs, on 04/25/19 pt weighed 128.04 lbs, and on 05/20/19 pt weighed 129.8 lbs. This indicates a 20 lb (14.3%) wt loss over the past 6 months and 10 lb (7.8%) wt loss in 1 month which is significant.  Per notes: - suprapubic catheter exchanged at bedside by urology - cultures sent and pending - chronically low BP, increase Midodrine  - transfuse 1 unit PRBC for drop in Hgb to 6 - seen by general surgery in the past for evaluation of sacral wound and it was decided that he is not a surgical candidate, wound care ordered -severe protein calorie malnutition -FTT - palliative consulted  I/Os: +492 ml since admit     +292 ml x 24 hrs UOP: 750 ml x 24 hrs Medications reviewed and include IVF: NaCl IVPB: Vancomycin Labs: Na 130 (L)  NUTRITION - FOCUSED PHYSICAL EXAM: Unable to complete full exam d/t patient sleeping  Observed severe muscle wasting to right temporal, right clavicle and acromion regions; severe fat depletions to right buccal and orbital regions.   Diet Order:   Diet Order  Diet regular Room service appropriate? Yes; Fluid consistency: Thin  Diet effective now              EDUCATION NEEDS:   No education needs have been identified at this time  Skin:  Skin Assessment: Skin Integrity Issues: Skin Integrity Issues:: Stage I, Unstageable Stage I: R hip; L hip Unstageable: Perineum  Last BM:  5/3 type 1 (colostomy)  Height:   Ht Readings from Last 1 Encounters:  06/17/19  6\' 1"  (1.854 m)    Weight:   Wt Readings from Last 1 Encounters:  06/17/19 54.4 kg    BMI:  Body mass index is 15.83 kg/m.  Estimated Nutritional Needs:   Kcal:  1900-2100  Protein:  95-105  Fluid:  >/= 1.9 L/day   Lajuan Lines, RD, LDN Clinical Nutrition After Hours/Weekend Pager # in [place]

## 2019-06-18 NOTE — TOC Initial Note (Addendum)
Transition of Care [facility]) - Initial/Assessment Note    Patient Details  Name: Dylan [name] MRN: [id number] Date of Birth: 10/01/1970  [facility]) CM/SW Contact:    Trish Mage, LCSW Phone Number: 06/18/2019, 10:28 AM  Clinical Narrative:   Met with patient who is high risk for readmission.  He was minimally engaged with blunt affect.  States he lives in [place] with daughter, has all needed DME, medications are affordable, needs no transportation to medical appointments because "they come to me."  Has Home Health services, but is unsure of agency.  Gave permission for me to call daughter about this.  Message left for daughter. TOC will continue to follow during the course of hospitalization.  Daughter Dorena Cookey called me back.  She is not the daughter with whom patient lives, but identified herself as the one who is responsive to inquiries.  Although she does not know the name of the [facility] agency, she expressed concerns about their lack of consistency [if reports are accurate] and some statements that have been made in patient's presence.  I told her I would have someone from that agency reach out to her.  She also states she used to take her father to medical appointments, but he refused to go starting a couple of months ago, and she is sure he has been homebound, and probably bed bound, for the past 30 days or so.  She knew nothing about [facility] in the home, and stated HH is the only service as far as she knows.             Expected Discharge Plan: [place] Barriers to Discharge: No Barriers Identified   Patient Goals and CMS Choice Patient states their goals for this hospitalization and ongoing recovery are:: "I have everything I need at this point."      Expected Discharge Plan and Services Expected Discharge Plan: [place]   Discharge Planning Services: CM Consult Post Acute Care Choice: [place] arrangements for the past 2  months: Apartment                                      Prior Living Arrangements/Services Living arrangements for the past 2 months: Apartment Lives with:: Adult Children Patient language and need for interpreter reviewed:: Yes Do you feel safe going back to the place where you live?: Yes      Need for Family Participation in Patient Care: Yes (Comment) Care giver support system in place?: Yes (comment) Current home services: Home RN Criminal Activity/Legal Involvement Pertinent to Current Situation/Hospitalization: No - Comment as needed  Activities of Daily Living Home Assistive Devices/Equipment: Walker (specify type)(bedside commode) ADL Screening (condition at time of admission) Patient's cognitive ability adequate to safely complete daily activities?: Yes Is the patient deaf or have difficulty hearing?: No Does the patient have difficulty seeing, even when wearing glasses/contacts?: No Does the patient have difficulty concentrating, remembering, or making decisions?: No Patient able to express need for assistance with ADLs?: Yes Does the patient have difficulty dressing or bathing?: Yes Independently performs ADLs?: Yes (appropriate for developmental age) Does the patient have difficulty walking or climbing stairs?: Yes Weakness of Legs: Both Weakness of Arms/Hands: Both  Permission Sought/Granted Permission sought to share information with : Family Supports Permission granted to share information with : Yes, Verbal Permission Granted  Share Information  with NAME: Ms Rawl     Permission granted to share info w Relationship: daughter  Permission granted to share info w Contact Information: [phone]  Emotional Assessment Appearance:: Appears stated age Attitude/Demeanor/Rapport: Engaged Affect (typically observed): Constricted Orientation: : Oriented to Self, Oriented to Place, Oriented to Situation Alcohol / Substance Use: Not Applicable Psych Involvement:  No (comment)  Admission diagnosis:  Hypercalcemia [E83.52] FTT (failure to thrive) in adult [R62.7] Suprapubic catheter dysfunction, initial encounter ([place]) [T83.010A] Sepsis ([place]) [A41.9] Patient Active Problem List   Diagnosis Date Noted  . Sacral wound 06/17/2019  . Osteomyelitis of sacrum ([place]) 06/17/2019  . Urinary tract infection associated with catheterization of urinary tract, initial encounter ([place]) 06/17/2019  . Goals of care, counseling/discussion 06/17/2019  . Pressure injury of skin 03/11/2019  . Wound discharge 03/09/2019  . Cancer cachexia ([place]) 02/06/2019  . Perforation of rectum from anal cancer s/p diverting colostomy 01/16/2019 02/06/2019  . Anal cellulitis 02/05/2019  . 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 Colostomy - diverting loop colostomy in place Dec 2020 02/05/2019  . Suprapubic tube in place Dec 2020 for rectourethral fistula 02/05/2019  . Sepsis due to undetermined organism ([place]) 02/04/2019  . Microcytic anemia 02/04/2019  . Severe protein-calorie malnutrition ([place]) 10/20/2018  . Hypotension 10/18/2018  . Tachycardia 10/18/2018  . Anemia 10/18/2018  . Iron deficiency 10/18/2018  . Sepsis ([place]) 10/18/2018  . Cancer associated pain 02/23/2017  . Metastatic anal cancer 02/10/2014   PCP:  [name], MD Pharmacy:   [id number] [place] [address] [id number]: [phone]: [phone]    Social Determinants of Health (SDOH) Interventions    Readmission Risk Interventions No flowsheet data found.

## 2019-06-18 NOTE — Progress Notes (Signed)
Notified on call of manual blood pressure of 80/56

## 2019-06-19 ENCOUNTER — Encounter: Payer: Self-pay | Admitting: *Deleted

## 2019-06-19 LAB — BASIC METABOLIC PANEL
Anion gap: 7 (ref 5–15)
BUN: 13 mg/dL (ref 6–20)
CO2: 22 mmol/L (ref 22–32)
Calcium: 9.5 mg/dL (ref 8.9–10.3)
Chloride: 104 mmol/L (ref 98–111)
Creatinine, Ser: 0.54 mg/dL — ABNORMAL LOW (ref 0.61–1.24)
GFR calc Af Amer: >60 mL/min (ref >60)
GFR calc non Af Amer: >60 mL/min (ref >60)
Glucose, Bld: 97 mg/dL (ref 70–99)
Potassium: 3.3 mmol/L — ABNORMAL LOW (ref 3.5–5.1)
Sodium: 133 mmol/L — ABNORMAL LOW (ref 135–145)

## 2019-06-19 LAB — TYPE AND SCREEN
ABO/RH(D): O POS
Antibody Screen: NEGATIVE
Unit division: 0

## 2019-06-19 LAB — BPAM RBC
Blood Product Expiration Date: 202106052359
ISSUE DATE / TIME: 202105041221
Unit Type and Rh: 5100

## 2019-06-19 LAB — CBC WITH DIFFERENTIAL/PLATELET
Abs Immature Granulocytes: 0.05 10*3/uL (ref 0.00–0.07)
Basophils Absolute: 0 10*3/uL (ref 0.0–0.1)
Basophils Relative: 0 %
Eosinophils Absolute: 0 10*3/uL (ref 0.0–0.5)
Eosinophils Relative: 0 %
HCT: 26.4 % — ABNORMAL LOW (ref 39.0–52.0)
Hemoglobin: 8.2 g/dL — ABNORMAL LOW (ref 13.0–17.0)
Immature Granulocytes: 1 %
Lymphocytes Relative: 8 %
Lymphs Abs: 0.8 10*3/uL (ref 0.7–4.0)
MCH: 21.5 pg — ABNORMAL LOW (ref 26.0–34.0)
MCHC: 31.1 g/dL (ref 30.0–36.0)
MCV: 69.3 fL — ABNORMAL LOW (ref 80.0–100.0)
Monocytes Absolute: 0.3 10*3/uL (ref 0.1–1.0)
Monocytes Relative: 4 %
Neutro Abs: 8.4 10*3/uL — ABNORMAL HIGH (ref 1.7–7.7)
Neutrophils Relative %: 87 %
Platelets: 395 10*3/uL (ref 150–400)
RBC: 3.81 MIL/uL — ABNORMAL LOW (ref 4.22–5.81)
RDW: 22.9 % — ABNORMAL HIGH (ref 11.5–15.5)
WBC: 9.5 10*3/uL (ref 4.0–10.5)
nRBC: 0 % (ref 0.0–0.2)

## 2019-06-19 MED ORDER — POTASSIUM CHLORIDE CRYS ER 20 MEQ PO TBCR
40.0000 meq | EXTENDED_RELEASE_TABLET | Freq: Once | ORAL | Status: AC
  Administered 2019-06-19: 40 meq via ORAL
  Filled 2019-06-19: qty 2

## 2019-06-19 NOTE — Progress Notes (Signed)
PROGRESS NOTE    Dylan [name]  PRX:[id number] DOB: February 25, 1970 DOA: 06/16/2019 PCP: [name], MD   Brief Narrative: Patient is a 49 year old male with history of Hodgkin's lymphoma, metastatic squamous cell anal carcinoma which was diagnosed in 2018, status post radiation therapy, diverting colostomy, suprapubic cath , history of rectourethral fistula who presents to emergency department complaints of generalized weakness, poor oral intake.  He reported that his urine was cloudy and foul-smelling.  Presented with abdominal pain, rectal pain. Suprapubic catheter was changed by urology at bedside.  Started on broad-spectrum antibiotics for possibility of sepsis. He met sepsis criteria on presentation including tachycardia, tachypnea, leukocytosis.  Started on IV fluids.  Cultures have been sent and pending. He has extensive stage IV sacral wound which is directly associated with advanced anal cancer.  He was seen by general surgery in the past for the evaluation of his sacral wound and it was decided that he is not a surgical candidate.  Wound care has been ordered. Palliative care has been consulted given his extremely poor prognosis, multiple comorbidities. Plan for continuing current abs for now,pending BC report  and keeping him inpatient until BP stablizes.   Assessment & Plan:   Active Problems:   Metastatic anal cancer   Sepsis ([place])   Severe protein-calorie malnutrition ([place])   Sacral wound   Osteomyelitis of sacrum (H[facility])   Urinary tract infection associated with catheterization of urinary tract, initial encounter ([place])   Goals of care, counseling/discussion   Septic shock due to worsening chronic sacral wound, present on admission: Presented with tachycardia, tachypnea, leukocytosis.  Cultures have been sent and pending,NGTD.  Currently on broad-spectrum antibiotics with vancomycin, zosyn. His blood pressure is chronically low.  Midodrine has been  increased to 10 mg 3  times daily.  Also continue stress dose hydrocortisone.  Currently afebrile.  Acute on chronic normocytic anemia: From chronic blood loss from the chronic sacral/anal wound  and failure to thrive.  Hemoglobin dropped to the range of 6 and she was transfused with 1 unit of PRBC. Hb stable at 8 today.  Failure to thrive: Severe protein calorie malnutrition in the setting of metastatic anal cancer.  Nutrition consulted and following.  Infected anal wound:Has necrotic, foul-smelling rectal/anal wound.General surgery was  consulted on earlier admission  but as per them there is no indication for surgical intervention. Recommended continued wound care therapy . Wound culture is pending, no organisms seen in the Gram stain. He had several surgeries done in the past in different hospitals. He is a status post colostomy and suprapubic catheter placement.Changed suprapubic catheter here.Urology was following.  Metastatic anal cancer: Squamous cell metastatic anal cancer with involvement of the bone and distant mets to the lungs.  Status post radiation therapy, surgery. Status post diverting colostomy on 01/17/2019 at [facility] and radiation therapy in the past.Also had suprapubic catheter placement.  He has history of noncompliance. Follows with oncology.  No longer on treatment.  Goals of care /advanced metastatic cancer: Palliative care consulted this time and was following in the past. Remains full code.   Generalized weakness: Physical therapy consulted.  Nutrition Problem: Severe Malnutrition Etiology: chronic illness(metastatic squamous cell anal carcinoma)      DVT prophylaxis:Lovenox Code Status: Full Family Communication: Brother on phone on 06/18/2019   status is: Inpatient  Remains inpatient appropriate because:Inpatient level of care appropriate due to severity of illness   Dispo: The patient is from: Home  Anticipated d/c is to: Home               Anticipated d/c date is: 2 days              Patient currently is not medically stable to d/c.   Consultants: palliative care  Procedures:None  Antimicrobials:  Anti-infectives (From admission, onward)   Start     Dose/Rate Route Frequency Ordered Stop   06/18/19 1400  piperacillin-tazobactam (ZOSYN) IVPB 3.375 g     3.375 g 12.5 mL/hr over 240 Minutes Intravenous Every 8 hours 06/18/19 1011     06/17/19 2200  vancomycin (VANCOREADY) IVPB 750 mg/150 mL     750 mg 150 mL/hr over 60 Minutes Intravenous Every 12 hours 06/17/19 1026     06/17/19 0600  vancomycin (VANCOCIN) IVPB 1000 mg/200 mL premix  Status:  Discontinued     1,000 mg 200 mL/hr over 60 Minutes Intravenous Every 12 hours 06/17/19 0246 06/17/19 1026   06/17/19 0400  metroNIDAZOLE (FLAGYL) IVPB 500 mg  Status:  Discontinued     500 mg 100 mL/hr over 60 Minutes Intravenous Every 8 hours 06/17/19 0106 06/18/19 0735   06/17/19 0400  ceFEPIme (MAXIPIME) 2 g in sodium chloride 0.9 % 100 mL IVPB  Status:  Discontinued     2 g 200 mL/hr over 30 Minutes Intravenous Every 8 hours 06/17/19 0246 06/18/19 0735   06/17/19 0115  ceFEPIme (MAXIPIME) 2 g in sodium chloride 0.9 % 100 mL IVPB  Status:  Discontinued     2 g 200 mL/hr over 30 Minutes Intravenous  Once 06/17/19 0106 06/17/19 0246   06/16/19 1900  ceFEPIme (MAXIPIME) 2 g in sodium chloride 0.9 % 100 mL IVPB     2 g 200 mL/hr over 30 Minutes Intravenous  Once 06/16/19 1849 06/16/19 2041   06/16/19 1900  metroNIDAZOLE (FLAGYL) IVPB 500 mg     500 mg 100 mL/hr over 60 Minutes Intravenous  Once 06/16/19 1849 06/16/19 2102   06/16/19 1900  vancomycin (VANCOCIN) IVPB 1000 mg/200 mL premix     1,000 mg 200 mL/hr over 60 Minutes Intravenous  Once 06/16/19 1849 06/16/19 2221      Subjective:  Patient seen and examined at the bedside this morning.  Hemodynamically stable but with soft blood pressure.  Blood pressure slightly improved today.  He is mostly bedbound, weak.  Denies  any specific complaints today.  Objective: Vitals:   06/18/19 1617 06/18/19 1621 06/18/19 1950 06/19/19 0424  BP: 95/64 95/64 101/64 (!) 95/54  Pulse: 66 66 (!) 54 (!) 55  Resp: 18 18 17 16   Temp: 98 F (36.7 C) 98 F (36.7 C) 97.8 F (36.6 C) 97.8 F (36.6 C)  TempSrc: Oral Oral  Oral  SpO2: 100% 100% 100% 100%  Weight:      Height:        Intake/Output Summary (Last 24 hours) at 06/19/2019 0755 Last data filed at 06/19/2019 0600 Gross per 24 hour  Intake 1004 ml  Output 600 ml  Net 404 ml   Filed Weights   06/17/19 0110  Weight: 54.4 kg    Examination:   General exam: Cachectic, malnourished, chronically looking Respiratory system: Bilateral equal air entry, normal vesicular breath sounds, no wheezes or crackles  Cardiovascular system: S1 & S2 heard, RRR. No JVD, murmurs, rubs, gallops or clicks. Gastrointestinal system: Abdomen is nondistended, soft and nontender. No organomegaly or masses felt. Normal bowel sounds heard.  Colostomy Central nervous system: Alert and  oriented. No focal neurological deficits. Extremities: No edema, no clubbing ,no cyanosis Skin: Stage IV sacral ulcer, anal ulcer.foul smelling wound    Data Reviewed: I have personally reviewed following labs and imaging studies  CBC: Recent Labs  Lab 06/16/19 1928 06/17/19 0939 06/18/19 0542 06/18/19 1945 06/19/19 0539  WBC 13.7* 10.8* 8.4  --  9.5  NEUTROABS 11.3* 8.8* 7.6  --  8.4*  HGB 8.1* 7.2* 6.7* 8.6* 8.2*  HCT 27.7* 24.5* 23.7* 27.7* 26.4*  MCV 67.6* 67.7* 70.1*  --  69.3*  PLT 609* 541* 372  --  628   Basic Metabolic Panel: Recent Labs  Lab 06/16/19 1928 06/18/19 0542 06/19/19 0539  NA 133* 130* 133*  K 4.2 4.2 3.3*  CL 99 102 104  CO2 26 21* 22  GLUCOSE 96 92 97  BUN 15 13 13   CREATININE 0.72 0.55* 0.54*  CALCIUM 11.1* 9.7 9.5  MG  --  1.9  --    GFR: Estimated Creatinine Clearance: 84.1 mL/min (A) (by C-G formula based on SCr of 0.54 mg/dL (L)). Liver Function  Tests: Recent Labs  Lab 06/16/19 1928 06/18/19 0542  AST 11* 17  ALT 11 11  ALKPHOS 78 57  BILITOT 0.5 0.5  PROT 7.5 6.1*  ALBUMIN 2.0* 1.8*   No results for input(s): LIPASE, AMYLASE in the last 168 hours. No results for input(s): AMMONIA in the last 168 hours. Coagulation Profile: Recent Labs  Lab 06/16/19 1928  INR 1.2   Cardiac Enzymes: No results for input(s): CKTOTAL, CKMB, CKMBINDEX, TROPONINI in the last 168 hours. BNP (last 3 results) No results for input(s): PROBNP in the last 8760 hours. HbA1C: No results for input(s): HGBA1C in the last 72 hours. CBG: No results for input(s): GLUCAP in the last 168 hours. Lipid Profile: No results for input(s): CHOL, HDL, LDLCALC, TRIG, CHOLHDL, LDLDIRECT in the last 72 hours. Thyroid Function Tests: No results for input(s): TSH, T4TOTAL, FREET4, T3FREE, THYROIDAB in the last 72 hours. Anemia Panel: Recent Labs    06/17/19 0939  FERRITIN 24  TIBC 165*  IRON 13*   Sepsis Labs: Recent Labs  Lab 06/16/19 1846 06/16/19 1944  PROCALCITON  --  0.15  LATICACIDVEN 1.6  --     Recent Results (from the past 240 hour(s))  Urine culture     Status: Abnormal   Collection Time: 06/16/19  6:46 PM   Specimen: In/Out Cath Urine  Result Value Ref Range Status   Specimen Description   Final    IN/OUT CATH URINE Performed at [facility], [place] [address], [place], [place] [address]    Special Requests   Final    NONE Performed at [facility], [place] [address] [place], [place] [address]    Culture MULTIPLE SPECIES PRESENT, SUGGEST RECOLLECTION (A)  Final   Report Status 06/17/2019 FINAL  Final  Blood Culture (routine x 2)     Status: None (Preliminary result)   Collection Time: 06/16/19  6:51 PM   Specimen: BLOOD  Result Value Ref Range Status   Specimen Description   Final    BLOOD RIGHT ARM Performed at [facility] [address], [place], [place] [address]     Special Requests   Final    BOTTLES DRAWN AEROBIC AND ANAEROBIC Blood Culture adequate volume Performed at [facility] [address], [place],  [address]    Culture   Final    NO GROWTH 2 DAYS Performed  at [facility], [place] [address] [place], [place] [address]    Report Status PENDING  Incomplete  Blood Culture (routine x 2)     Status: None (Preliminary result)   Collection Time: 06/16/19  7:28 PM   Specimen: BLOOD RIGHT FOREARM  Result Value Ref Range Status   Specimen Description   Final    BLOOD RIGHT FOREARM Performed at [facility] [address] [place], [place] [address]    Special Requests   Final    BOTTLES DRAWN AEROBIC AND ANAEROBIC Blood Culture adequate volume Performed at [facility] [address] [place], [place] [address]    Culture   Final    NO GROWTH 2 DAYS Performed at [facility] [address] [place], [place] [address]    Report Status PENDING  Incomplete  Respiratory Panel by RT PCR (Flu A&B, Covid) - Nasopharyngeal Swab     Status: None   Collection Time: 06/16/19 10:06 PM   Specimen: Nasopharyngeal Swab  Result Value Ref Range Status   SARS Coronavirus 2 by RT PCR NEGATIVE NEGATIVE Final    Comment: (NOTE) SARS-CoV-2 target nucleic acids are NOT DETECTED. The SARS-CoV-2 RNA is generally detectable in upper respiratoy specimens during the acute phase of infection. The lowest concentration of SARS-CoV-2 viral copies this assay can detect is 131 copies/mL. A negative result does not preclude SARS-Cov-2 infection and should not be used as the sole basis for treatment or other patient management decisions. A negative result may occur with  improper specimen collection/handling, submission of specimen other than nasopharyngeal swab, presence of viral mutation(s) within the areas targeted by this assay, and inadequate number of viral copies (<131 copies/mL). A  negative result must be combined with clinical observations, patient history, and epidemiological information. The expected result is Negative. Fact Sheet for Patients:  PinkCheek.be Fact Sheet for Healthcare Providers:  GravelBags.it This test is not yet ap proved or cleared by the [place] FDA and  has been authorized for detection and/or diagnosis of SARS-CoV-2 by FDA under an Emergency Use Authorization (EUA). This EUA will remain  in effect (meaning this test can be used) for the duration of the COVID-19 declaration under Section 564(b)(1) of the Act, 21 U.S.C. section 360bbb-3(b)(1), unless the authorization is terminated or revoked sooner.    Influenza A by PCR NEGATIVE NEGATIVE Final   Influenza B by PCR NEGATIVE NEGATIVE Final    Comment: (NOTE) The Xpert Xpress SARS-CoV-2/FLU/RSV assay is intended as an aid in  the diagnosis of influenza from Nasopharyngeal swab specimens and  should not be used as a sole basis for treatment. Nasal washings and  aspirates are unacceptable for Xpert Xpress SARS-CoV-2/FLU/RSV  testing. Fact Sheet for Patients: PinkCheek.be Fact Sheet for Healthcare Providers: GravelBags.it This test is not yet approved or cleared by the [place] FDA and  has been authorized for detection and/or diagnosis of SARS-CoV-2 by  FDA under an Emergency Use Authorization (EUA). This EUA will remain  in effect (meaning this test can be used) for the duration of the  Covid-19 declaration under Section 564(b)(1) of the Act, 21  U.S.C. section 360bbb-3(b)(1), unless the authorization is  terminated or revoked. Performed at [facility], [place] [address], [place],  [address]   Aerobic/Anaerobic Culture (surgical/deep wound)     Status: None (Preliminary result)   Collection Time: 06/17/19  7:42 AM   Specimen: Wound    Result Value Ref Range  Status   Specimen Description   Final    WOUND SACRAL Performed at [facility] [address], [place], [place] [address]    Special Requests   Final    NONE Performed at [facility], [place] [address] [place], [place] [address]    Gram Stain NO WBC SEEN NO ORGANISMS SEEN   Final   Culture   Final    CULTURE REINCUBATED FOR BETTER GROWTH Performed at [facility], [place] [address], [place], [place] [address]    Report Status PENDING  Incomplete         Radiology Studies: CT ABDOMEN PELVIS W CONTRAST  Result Date: 06/17/2019 CLINICAL DATA:  Sepsis with extensive sacral wound, evaluate for osteomyelitis. EXAM: CT ABDOMEN AND PELVIS WITH CONTRAST TECHNIQUE: Multidetector CT imaging of the abdomen and pelvis was performed using the standard protocol following bolus administration of intravenous contrast. CONTRAST:  125m OMNIPAQUE IOHEXOL 300 MG/ML  SOLN COMPARISON:  PET CT 03/22/2019 FINDINGS: Lower chest: Numerous pulmonary nodules compatible with metastatic disease. When compared to PET-CT 03/22/2019 there has been growth. The dominant nodule at the right base measures 11 mm compared to 8 mm previously. Hepatobiliary: No focal liver abnormality.No evidence of biliary obstruction or stone. Pancreas: Unremarkable. Spleen: Unremarkable. Adrenals/Urinary Tract: Negative adrenals. No hydronephrosis or stone. Mildly heterogeneous perfusion of the right kidney, nonspecific and not convincing for pyelonephritis. Chronic thick walled bladder with no distinct plane between it and the pelvic tumor. There is a suprapubic catheter in good position. Masslike appearance in the bladder encompassing an eccentric curvilinear high-density structure, question sacrificed balloon which has collapsed. The adjacent material could be tumor or debris. Overall mass dimensions are 5 cm. Stomach/Bowel: History of anal carcinoma with deep excavated wound  lined by nodular enhancing tumor which is excavated the lower pelvis. The anus has been resected or eroded. There is invasion into both sides of the pubic bones and ischial tuberosities. Tumor involves the bladder base and remaining prostate. Descending colostomy which is patent. Vascular/Lymphatic: No acute vascular finding. Compared to prior PET-CT there is stable iliac lymph nodes measuring up to 15 mm short axis on the left. Reproductive:Erosion of the posterior urethra and lower prostate from tumor. Other: No ascites or pneumoperitoneum. Musculoskeletal: Extensive bony erosion is noted above. No superimposed inflammation is seen to implicate superimposed osteomyelitis. There is thickening of the right more than left sacral plexus compatible with perineural tumor spread. A new deposit is seen in the right intrinsic back muscles, measuring 15 mm. There is also a new and not clearly contiguous tumor deposit in the left lower gluteal musculature. IMPRESSION: 1. Deeply excavated anal carcinoma involving the pelvic viscera and bilateral bony pelvis. To the clinical question, no evidence of superimposed osteomyelitis or soft tissue infection. There has been progression of metastatic disease with new soft tissue deposit and mildly enlarging pulmonary nodules. There is also evidence of perineural tumor spread at the bilateral sacral plexus. 2. Masslike abnormality with high-density component in the bladder which was not seen on 03/22/2019 study. Has there been a sacrificed suprapubic catheter since prior? Electronically Signed   By: JMonte FantasiaM.D.   On: 06/17/2019 11:57        Scheduled Meds: . collagenase   Topical Daily  . enoxaparin (LOVENOX) injection  40 mg Subcutaneous Daily  . feeding supplement (ENSURE ENLIVE)  237 mL Oral TID BM  . hydrocortisone sod succinate (SOLU-CORTEF) inj  100 mg Intravenous Q8H  . metoCLOPramide  5 mg Oral TID AC  . midodrine  10 mg Oral TID WC  . multivitamin  1  tablet Oral Daily  . nutrition supplement (JUVEN)  1 packet Oral BID BM  . nystatin   Topical BID   Continuous Infusions: . sodium chloride 125 mL/hr at 06/18/19 2357  . piperacillin-tazobactam (ZOSYN)  IV 3.375 g (06/19/19 0500)  . vancomycin Stopped (06/18/19 2239)     LOS: 2 days    Time spent: 25 mins,More than 50% of that time was spent in counseling and/or coordination of care.      Shelly Coss, MD [facility] P5/06/2019, 7:55 AM

## 2019-06-19 NOTE — Consult Note (Signed)
[place] Nurse wound follow up Wound type:sacral/rectal wound has been treated with betadine soaked gauze x 4 days per MD order.  Will implement NS moist kerlix at this time.   Measurement:unchanged Wound [id number] Drainage (amount, consistency, odor) moderate serosanguinous with musty odor to rectal wound Periwound:intact Dressing procedure/placement/frequency:  Cleanse sacral/rectal wound with NS and pat dry.  Apply NS  moist kerlix to wound bed.  Cover with dry gauze and ABD pads/tape.  Change daily,  Cleanse wounds to hips with NS and pat dry.  Apply Santyl to wound bed.  Cover  with NS moist gauze and secure with dry gauze and tape. Change daily.  Interdry to inguinal folds. Will not follow at this time.  Please re-consult if needed.  Domenic Moras MSN, RN, FNP-BC CWON Wound, Ostomy, Continence Nurse Pager [phone]

## 2019-06-19 NOTE — TOC Progression Note (Signed)
Transition of Care [facility]) - Progression Note    Patient Details  Name: Dylan [name] MRN: [id number] Date of Birth: [birth year]  Transition of Care [facility]) CM/SW [place], [place] Phone Number: 06/19/2019, 2:08 PM  Clinical Narrative:   Saw patient today in follow up to questions that have come up related to care.  First, [name] with [facility] informed me that they would not be renewing their contract with Mr Bezio at the termination of his current episode, which ends today.  She agreed to call daughter Dorena Cookey to let her know of this decision, and I followed up Mr Pangburn, who stated he is fine with me finding an Therapist, sports from a different agency to work with him.  He expressed no preference of company.  Also, I clarified with him that he is getting PCS services at the rate of 3 hours per week.  He states someone recently began coming several days a week briefly to do some cleaning around the house and help him with personal hygiene as well.  He is appreciative of this service.   Referred to Amy with [facility], who accepted the case for [facility] RN services.  PT still attempting to assess.  TOC will continue to follow during the course of hospitalization.       Expected Discharge Plan: [place] Barriers to Discharge: No Barriers Identified  Expected Discharge Plan and Services Expected Discharge Plan: [place]   Discharge Planning Services: CM Consult Post Acute Care Choice: [place] arrangements for the past 2 months: Apartment                                       Social Determinants of Health (SDOH) Interventions    Readmission Risk Interventions No flowsheet data found.

## 2019-06-19 NOTE — Progress Notes (Signed)
[place] Work  Clinical Social Work contacted [employer] to follow up on [employer] referral.  [facility] completed an assessment on 05/10/19 and patient was approved for 22 hours a month of PCS services.  [facility] [phone]) accepted the referral on 05/20/2019 and submitted a service plan to [facility] on 06/04/2019.  [facility] was unable to provide the dates of service; as [facility] would need to provide that information.    Johnnye Lana, MSW, LCSW, OSW-C Clinical Social Worker [facility] [phone]

## 2019-06-19 NOTE — Progress Notes (Signed)
PT Cancellation Note  Patient Details Name: Dylan [name] MRN: [id number] DOB: [birth year]   Cancelled Treatment:    Reason Eval/Treat Not Completed: Attempted PT eval-pt declined to participate at this time. Will check back as schedule allows.   Doreatha Massed, PT Acute Rehabilitation

## 2019-06-19 NOTE — Progress Notes (Signed)
   06/19/19 1000  Clinical Encounter Type  Visited With Patient  Visit Type Initial;Psychological support;Spiritual support  Referral From Nurse  Consult/Referral To Chaplain  Spiritual Encounters  Spiritual Needs Emotional;Brochure;Other (Comment) (Advance Directive )  Stress Factors  Patient Stress Factors Other (Comment) (Advance Directive )   I visited briefly with Dylan [name] per spiritual care consult. I provided him with a copy of the Advance Directive. He did not want to discuss at this time. Please, contact Spiritual Care for further assistance.   Chaplain Shanon Ace M.Div., [facility]

## 2019-06-19 NOTE — Progress Notes (Signed)
Daily Progress Note   Patient Name: Dylan [name]       Date: 06/19/2019 DOB: 08-05-1970  Age: 49 y.o. MRN#: [id number] Attending Physician: Shelly Coss, MD Primary Care Physician: [name], MD Admit Date: 06/16/2019  Reason for Consultation/Follow-up: Establishing goals of care  Subjective: Met today with Dylan [name].  Discussed his clinical course and that I had touched base with Dr. Lorenso Courier and he is not a candidate for further disease modifying therapy.  He reports that he understands concern, but he feels that he "just needs to gain weight" and will be able to reconsidered for immunotherapy.  He then stated that he is worried he is "running out of time" to be able to gain weight and receive further treatment.  Attempted to explore this further, but he was not open to further discussion.   He denies any complaints, including denying pain, nausea, and poor appetite.   Length of Stay: 2  Current Medications: Scheduled Meds:  . collagenase   Topical Daily  . enoxaparin (LOVENOX) injection  40 mg Subcutaneous Daily  . feeding supplement (ENSURE ENLIVE)  237 mL Oral TID BM  . hydrocortisone sod succinate (SOLU-CORTEF) inj  100 mg Intravenous Q8H  . metoCLOPramide  5 mg Oral TID AC  . midodrine  10 mg Oral TID WC  . multivitamin  1 tablet Oral Daily  . nutrition supplement (JUVEN)  1 packet Oral BID BM  . nystatin   Topical BID    Continuous Infusions: . sodium chloride 100 mL/hr at 06/19/19 2155  . piperacillin-tazobactam (ZOSYN)  IV 3.375 g (06/19/19 2156)  . vancomycin 750 mg (06/19/19 2200)    PRN Meds: acetaminophen **OR** acetaminophen, ondansetron **OR** ondansetron (ZOFRAN) IV, oxyCODONE  Physical Exam         General: Alert, awake, in no acute distress.  Cachectic. HEENT: No bruits, no goiter, no JVD Heart: Regular rate and rhythm. No murmur appreciated. Lungs: Good air movement, clear Abdomen: Soft, nontender, nondistended, positive bowel sounds. Colostomy noted.  Ext: No significant edema Skin: Warm and dry Neuro: Grossly intact, nonfocal.   Vital Signs: BP 110/71 (BP Location: Left Arm)   Pulse 62   Temp 97.9 F (36.6 C) (Oral)   Resp 18   Ht 6' 1"  (1.854 m)   Wt 54.4 kg  SpO2 99%   BMI 15.83 kg/m  SpO2: SpO2: 99 % O2 Device: O2 Device: Room Air O2 Flow Rate:    Intake/output summary:   Intake/Output Summary (Last 24 hours) at 06/19/2019 2226 Last data filed at 06/19/2019 1900 Gross per 24 hour  Intake 689 ml  Output 1250 ml  Net -561 ml   LBM: Last BM Date: 06/19/19 Baseline Weight: Weight: 54.4 kg Most recent weight: Weight: 54.4 kg       Palliative Assessment/Data:    Flowsheet Rows     Most Recent Value  Intake Tab  Referral Department  Hospitalist  Unit at Time of Referral  Med/Surg Unit  Palliative Care Primary Diagnosis  Cancer  Date Notified  06/17/19  Palliative Care Type  Return patient Palliative Care  Reason for referral  Clarify Goals of Care  Date of Admission  06/16/19  Date first seen by Palliative Care  06/18/19  # of days Palliative referral response time  1 Day(s)  # of days IP prior to Palliative referral  1  Clinical Assessment  Palliative Performance Scale Score  30%  Psychosocial & Spiritual Assessment  Palliative Care Outcomes  Patient/Family meeting held?  Yes  Who was at the meeting?  Patient  Palliative Care Outcomes  Clarified goals of care      Patient Active Problem List   Diagnosis Date Noted  . Sacral wound 06/17/2019  . Osteomyelitis of sacrum ([place]) 06/17/2019  . Urinary tract infection associated with catheterization of urinary tract, initial encounter ([place]) 06/17/2019  . Goals of care, counseling/discussion 06/17/2019  . Pressure injury of skin 03/11/2019  .  Wound discharge 03/09/2019  . Cancer cachexia ([place]) 02/06/2019  . Perforation of rectum from anal cancer s/p diverting colostomy 01/16/2019 02/06/2019  . Anal cellulitis 02/05/2019  . 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 Colostomy - diverting loop colostomy in place Dec 2020 02/05/2019  . Suprapubic tube in place Dec 2020 for rectourethral fistula 02/05/2019  . Sepsis due to undetermined organism ([place]) 02/04/2019  . Microcytic anemia 02/04/2019  . Severe protein-calorie malnutrition ([place]) 10/20/2018  . Hypotension 10/18/2018  . Tachycardia 10/18/2018  . Anemia 10/18/2018  . Iron deficiency 10/18/2018  . Sepsis ([place]) 10/18/2018  . Cancer associated pain 02/23/2017  . Metastatic anal cancer 02/10/2014    Palliative Care Assessment & Plan   Recommendations/Plan:  Dylan [name] wishes to continue with full code/full scope treatment.  He expressed that he understands being told he is not a candidate for further disease modifying therapy, but he feels that he can improve his nutrition and change this.  Explained that with his poor nutrition and non-healing wound he is not going to be a candidate for further treatment for his cancer moving forward.  I do not this he is accepting of this at this point.  Will continue to support and progress conversation based upon his willingness and emotional ability to participate in conversation.  Goals of Care and Additional Recommendations:  Limitations on Scope of Treatment: Full Scope Treatment  Code Status:    Code Status Orders  (From admission, onward)         Start     Ordered   06/17/19 0251  Full code  Continuous     06/17/19 0250        Code Status History    Date Active Date Inactive Code Status Order ID Comments User Context   06/17/2019 0111 06/17/2019 0250 Full Code [id number]  Vernelle Emerald,  MD ED   03/09/2019 1849 03/14/2019 2136 Full Code [id number]  [place] Eric J, [facility]   02/04/2019 2337  02/09/2019 1735 Full Code [id number]  Reubin Milan, MD ED   10/18/2018 2102 10/21/2018 1553 Full Code [id number]  [name], MD ED   Advance Care Planning Activity    Advance Directive Documentation     Most Recent Value  Type of Advance Directive  Healthcare Power of Attorney, Living will  Pre-existing out of facility DNR order (yellow form or pink MOST form)  --  "MOST" Form in Place?  --       Prognosis:   Guarded  Discharge Planning:  To Be Determined  Care plan was discussed with patient  Thank you for allowing the Palliative Medicine Team to assist in the care of this patient.   Total Time 30 Prolonged Time Billed No      Greater than 50%  of this time was spent counseling and coordinating care related to the above assessment and plan.  Micheline Rough, MD  Please contact Palliative Medicine Team phone at [phone] for questions and concerns.

## 2019-06-20 ENCOUNTER — Encounter (HOSPITAL_COMMUNITY): Payer: Self-pay | Admitting: Internal Medicine

## 2019-06-20 LAB — BASIC METABOLIC PANEL
Anion gap: 7 (ref 5–15)
BUN: 12 mg/dL (ref 6–20)
CO2: 22 mmol/L (ref 22–32)
Calcium: 9.2 mg/dL (ref 8.9–10.3)
Chloride: 106 mmol/L (ref 98–111)
Creatinine, Ser: 0.66 mg/dL (ref 0.61–1.24)
GFR calc Af Amer: >60 mL/min (ref >60)
GFR calc non Af Amer: >60 mL/min (ref >60)
Glucose, Bld: 106 mg/dL — ABNORMAL HIGH (ref 70–99)
Potassium: 3.6 mmol/L (ref 3.5–5.1)
Sodium: 135 mmol/L (ref 135–145)

## 2019-06-20 LAB — CBC WITH DIFFERENTIAL/PLATELET
Abs Immature Granulocytes: 0.03 10*3/uL (ref 0.00–0.07)
Basophils Absolute: 0 10*3/uL (ref 0.0–0.1)
Basophils Relative: 0 %
Eosinophils Absolute: 0 10*3/uL (ref 0.0–0.5)
Eosinophils Relative: 0 %
HCT: 29.9 % — ABNORMAL LOW (ref 39.0–52.0)
Hemoglobin: 8.9 g/dL — ABNORMAL LOW (ref 13.0–17.0)
Immature Granulocytes: 0 %
Lymphocytes Relative: 9 %
Lymphs Abs: 0.8 10*3/uL (ref 0.7–4.0)
MCH: 22 pg — ABNORMAL LOW (ref 26.0–34.0)
MCHC: 29.8 g/dL — ABNORMAL LOW (ref 30.0–36.0)
MCV: 73.8 fL — ABNORMAL LOW (ref 80.0–100.0)
Monocytes Absolute: 0.1 10*3/uL (ref 0.1–1.0)
Monocytes Relative: 2 %
Neutro Abs: 7.2 10*3/uL (ref 1.7–7.7)
Neutrophils Relative %: 89 %
Platelets: 412 10*3/uL — ABNORMAL HIGH (ref 150–400)
RBC: 4.05 MIL/uL — ABNORMAL LOW (ref 4.22–5.81)
RDW: 23.9 % — ABNORMAL HIGH (ref 11.5–15.5)
WBC: 8.2 10*3/uL (ref 4.0–10.5)
nRBC: 0 % (ref 0.0–0.2)

## 2019-06-20 MED ORDER — HYDROCORTISONE NA SUCCINATE PF 100 MG IJ SOLR
50.0000 mg | Freq: Three times a day (TID) | INTRAMUSCULAR | Status: DC
  Administered 2019-06-20 – 2019-06-21 (×3): 50 mg via INTRAVENOUS
  Filled 2019-06-20 (×3): qty 2

## 2019-06-20 NOTE — Plan of Care (Signed)
  Problem: Health Behavior/Discharge Planning: °Goal: Ability to manage health-related needs will improve °Outcome: Progressing °  °Problem: Education: °Goal: Knowledge of General Education information will improve °Description: Including pain rating scale, medication(s)/side effects and non-pharmacologic comfort measures °Outcome: Progressing °  °Problem: Nutrition: °Goal: Adequate nutrition will be maintained °Outcome: Progressing °  °

## 2019-06-20 NOTE — Plan of Care (Signed)
  Problem: Nutrition: Goal: Adequate nutrition will be maintained Outcome: Progressing   Problem: Pain Managment: Goal: General experience of comfort will improve Outcome: Progressing   Problem: Safety: Goal: Ability to remain free from injury will improve Outcome: Progressing   

## 2019-06-20 NOTE — Consult Note (Signed)
[place]  [phone] Fax:([phone]) [place]  Reason for Consultation: Metastatic anal cancer  Hematological/Oncological History #Metastatic Anal Cancer (summarized history obtained from [facility]) 1)03/09/10: CT scan performed showed superficial inflammation along the medial crease of the left buttocks within the subcutaneous fat measuring 2.5 x 5 cm including no areas of deep penetration with a few associated bubbles of air and gas present. 2)03/10/10: A 2 x 2 cm area of condylomata was present in the perianal area that was excised by Dr. [name], along with multiple I&Ds of left gluteal abscesses. 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 No lymph nodes biopsied. 3)03/29/10: PET scan showedabnormal 5.7 x 7.1 cm hypermetabolic soft tissue in the perianal region is compatible with patient's history of squamous cell carcinoma.Bilateral inguinal adenopathy, largest on left is hypermetabolic and measures 1.8 x 2.9 cm, and largest on the right measures 1.2 cm without hypermetabolism; question secondary to infection or metastatic involvement 4)04/2010: Evaluated by Radiation Oncologist Dr. [name] at [facility]. He was not deemed a candidate for radiation given concern for metastatic disease based on PET scan. 5)06/22/10: Recommended to begin systemic therapy with cisplatin and continuous infusion 5-FU for metastatic disease. 6)07/2010: Re-evaluated at [facility] by Dr. [name] and Dr. [name], as he did not have biopsy proven metastatic disease, and inguinal adenopathy may be reactive due to infection/inflammation/abscess. 7)07/22/10: FNA of left inguinal lymph node identified no malignant cells, only polymorphous lymphoid material 8) 07/27/10: exam under anesthesia reveals suprapubic condyloma excision showed condyloma acuminatum with extensive high grade squamous  dysplasia with areas of evolution to squamous cell carcinoma in situ, but negative for definite invasion. 9)02/10/14: After lost to follow-up, he returned to [facility] for evaluation by Dr. [name] due to concern for recurrence of disease throughout the perineum and perianal areas. The most worrisome is a large ulcer on the left perianal area. 10)02/18/14: Exam under anesthesia with biopsies performed.Left perianal region biopsy confirmed invasive squamous cell carcinoma, moderate-to-poorly differentiated.Patient refusedat this timeAPR. 11)05/01/14: PET CT: Perianal and left groin/penile malignancy with bilateral inguinal and left external iliac metastases. 12)07/07/14 - 09/03/14: XRT. Patient declined recommended concurrent 5FU/[facility]. At [place]. Patient refused APR. 13)12/19/16: PET CT: No suspicious metabolically active osseous lesions are identified - Interval increase in the extent of the hypermetabolic activity involving the region of the anus which extends up into the fascia adjacent the rectum and out into the bilateral buttocks associated with soft tissue erosion and skin thickening. 14)12/19/16: MRI Pelvis: Motion artefact. Anal tumor with gross invasion of the anal sphincters and extension into the ischioanal fossa, with irregular appearance of the perineum/gluteal skin, also abutting the pelvic sidewall (left > right) and likely invading prostate apex. Overall, this is increased from prior CT 01/05/2015, concerning for disease progression.Exam revealed obliteration of the anus. Patient refused APR. 15)03/2018 - consideration for pembrolizumab - patient declined. 16)10/2018 - represents with substantial bleeding episodes from erosive, locally-advanced anal cancer 17) 11/12/2018: reviewed by [facility] surgery again. Admitted for consideration of ostomy from 10/4 to 11/21/2018. 18) 12/10/18-12/12/18: patient seen by [place] (Dr. Dennison Nancy) and Surgical Oncology.  MRI pelvis, and CT  chest performed. 19) 12/19/2018: establish care with Dr. Lorenso Courier  20)  12/21/2018: 4th opinion received at [facility] [place] with Dr. [name].  21) 01/17/2019: Underwent a diverting colostomy with placement of a colostomy bag at [facility]. Tumor tissue excised for pathological review.  22) 12/21-12/26/2020: admitted  to [facility] for marked anemia, weakness and hypotension. 23) 03/09/2019-03/14/2019: admitted to [facility] for weakness and hypotension, found to have severe sepsis.   #High Grade Non-Hodgkins Lymphoma 1)1999: He was treated for high-grade Non-Hodgkin's Lymphoma by Dr. [name] in [place], [place]. He received a year of chemotherapy. No radiation.  HPI: Mr. Dylan [name] is a 49 year old male with a past medical history significant for metastatic anal cancer and history of Hodgkin's lymphoma. The patient presented to the ER with weakness and wound infection.  He was also having anorexia and mild lower abdominal pain.  He met SIRS criteria concerning for developing sepsis secondary to UTI.  He was started on IV antibiotics.  Urine culture shows multiple species.  Blood cultures negative to date.  He is currently afebrile and other vital signs are stable.  The patient has been seen by the palliative care team to discuss goals of care.  He continues to desire full scope of treatment and is hoping to improve his nutritional status so that he may be considered for treatment at some point in the future.  The patient reports he is not having any fevers or chills.  He is still having pain at his wound site.  Appetite is fair.  Denies abdominal pain, nausea, vomiting.  No chest pain or shortness of breath.  He denies bleeding.  Medical oncology is consulting regarding his metastatic anal cancer.   Past Medical History:  Diagnosis Date  . Cancer ([place]) 2018   anal cancer  . Non Hodgkin's lymphoma ([place]) 1999  . S/P radiation therapy 07/07/14-7/20/216   anal ca   :  Past Surgical History:   Procedure Laterality Date  . COLOSTOMY  2020   diverting colostomy at [facility]  . ILEOSTOMY  2020   diverting colostomy, [employer]  :  Current Facility-Administered Medications  Medication Dose Route Frequency Provider Last Rate Last Admin  . 0.9 %  sodium chloride infusion   Intravenous Continuous Shelly Coss, MD 75 mL/hr at 06/20/19 0918 New Bag at 06/20/19 2671  . acetaminophen (TYLENOL) tablet 650 mg  650 mg Oral Q6H PRN Shalhoub, Sherryll Burger, MD       Or  . acetaminophen (TYLENOL) suppository 650 mg  650 mg Rectal Q6H PRN Shalhoub, Sherryll Burger, MD      . collagenase (SANTYL) ointment   Topical Daily Shelly Coss, MD   Given at 06/20/19 [id number]  . enoxaparin (LOVENOX) injection 40 mg  40 mg Subcutaneous Daily Shalhoub, Sherryll Burger, MD   40 mg at 06/17/19 1133  . feeding supplement (ENSURE ENLIVE) (ENSURE ENLIVE) liquid 237 mL  237 mL Oral TID BM Adhikari, Amrit, MD   237 mL at 06/20/19 0920  . hydrocortisone sodium succinate (SOLU-CORTEF) 100 MG injection 50 mg  50 mg Intravenous Q8H Adhikari, Amrit, MD   50 mg at 06/20/19 0922  . metoCLOPramide (REGLAN) tablet 5 mg  5 mg Oral TID AC Domingo Cocking, Gene, MD   5 mg at 06/20/19 0900  . midodrine (PROAMATINE) tablet 10 mg  10 mg Oral TID WC Adhikari, Amrit, MD   10 mg at 06/20/19 0900  . multivitamin (PROSIGHT) tablet 1 tablet  1 tablet Oral Daily Shelly Coss, MD   1 tablet at 06/20/19 0998  . nutrition supplement (JUVEN) (JUVEN) powder packet 1 packet  1 packet Oral BID BM Shelly Coss, MD   1 packet at 06/20/19 0920  . nystatin (MYCOSTATIN/NYSTOP) topical powder   Topical BID Shalhoub, Sherryll Burger, MD   Given at  06/20/19 5638  . ondansetron (ZOFRAN) tablet 4 mg  4 mg Oral Q6H PRN Shalhoub, Sherryll Burger, MD       Or  . ondansetron [facility]) injection 4 mg  4 mg Intravenous Q6H PRN Shalhoub, Sherryll Burger, MD      . oxyCODONE (Oxy IR/ROXICODONE) immediate release tablet 20 mg  20 mg Oral Q6H PRN Vernelle Emerald, MD   20 mg at 06/20/19 0050  .  piperacillin-tazobactam (ZOSYN) IVPB 3.375 g  3.375 g Intravenous Q8H Lenis Noon, RPH 12.5 mL/hr at 06/20/19 0509 3.375 g at 06/20/19 0509  . vancomycin (VANCOREADY) IVPB 750 mg/150 mL  750 mg Intravenous Q12H Lenis Noon, RPH 150 mL/hr at 06/20/19 0920 750 mg at 06/20/19 0920     No Known Allergies:  Family History  Problem Relation Age of Onset  . Stroke Mother   :  Social History   Socioeconomic History  . Marital status: Single    Spouse name: Not on file  . Number of children: Not on file  . Years of education: Not on file  . Highest education level: Not on file  Occupational History  . Not on file  Tobacco Use  . Smoking status: Current Some Day Smoker    Packs/day: 0.30    Types: Cigarettes  . Smokeless tobacco: Current User  Substance and Sexual Activity  . Alcohol use: No  . Drug use: Not on file  . Sexual activity: Not on file  Other Topics Concern  . Not on file  Social History Narrative  . Not on file   Social Determinants of Health   Financial Resource Strain:   . Difficulty of Paying Living Expenses:   Food Insecurity:   . Worried About Charity fundraiser in the Last Year:   . Arboriculturist in the Last Year:   Transportation Needs:   . Film/video editor (Medical):   Marland Kitchen Lack of Transportation (Non-Medical):   Physical Activity:   . Days of Exercise per Week:   . Minutes of Exercise per Session:   Stress:   . Feeling of Stress :   Social Connections:   . Frequency of Communication with Friends and Family:   . Frequency of Social Gatherings with Friends and Family:   . Attends Religious Services:   . Active Member of Clubs or Organizations:   . Attends Archivist Meetings:   Marland Kitchen Marital Status:   Intimate Partner Violence:   . Fear of Current or Ex-Partner:   . Emotionally Abused:   Marland Kitchen Physically Abused:   . Sexually Abused:   :  Review of Systems: A comprehensive 14 point review of systems was negative except as noted  in the HPI.  Exam: Patient Vitals for the past 24 hrs:  BP Temp Temp src Pulse Resp SpO2  06/20/19 0552 (!) 132/94 (!) 97.4 F (36.3 C) Oral (!) 57 17 100 %  06/19/19 1923 110/71 97.9 F (36.6 C) Oral 62 18 99 %  06/19/19 1533 101/67 97.9 F (36.6 C) Oral 67 17 100 %    General: Cachectic, no distress Eyes:  no scleral icterus.   ENT:  There were no oropharyngeal lesions.   Neck was without thyromegaly.   Lymphatics:  Negative cervical, supraclavicular or axillary adenopathy.   Respiratory: lungs were clear bilaterally without wheezing or crackles.   Cardiovascular:  Regular rate and rhythm, S1/S2, without murmur, rub or gallop.  There was no pedal edema.  GI: Positive bowel sounds, soft, nontender, ostomy in the left lower abdomen   Skin: Not viewed today, wound care ongoing per nursing.   Neuro exam was nonfocal. Patient was alert and oriented.  Affect was flat and he was somewhat withdrawn.  Lab Results  Component Value Date   WBC 8.2 06/20/2019   HGB 8.9 (L) 06/20/2019   HCT 29.9 (L) 06/20/2019   PLT 412 (H) 06/20/2019   GLUCOSE 106 (H) 06/20/2019   ALT 11 06/18/2019   AST 17 06/18/2019   NA 135 06/20/2019   K 3.6 06/20/2019   CL 106 06/20/2019   CREATININE 0.66 06/20/2019   BUN 12 06/20/2019   CO2 22 06/20/2019    CT ABDOMEN PELVIS W CONTRAST  Result Date: 06/17/2019 CLINICAL DATA:  Sepsis with extensive sacral wound, evaluate for osteomyelitis. EXAM: CT ABDOMEN AND PELVIS WITH CONTRAST TECHNIQUE: Multidetector CT imaging of the abdomen and pelvis was performed using the standard protocol following bolus administration of intravenous contrast. CONTRAST:  157m OMNIPAQUE IOHEXOL 300 MG/ML  SOLN COMPARISON:  PET CT 03/22/2019 FINDINGS: Lower chest: Numerous pulmonary nodules compatible with metastatic disease. When compared to PET-CT 03/22/2019 there has been growth. The dominant nodule at the right base measures 11 mm compared to 8 mm previously. Hepatobiliary: No focal  liver abnormality.No evidence of biliary obstruction or stone. Pancreas: Unremarkable. Spleen: Unremarkable. Adrenals/Urinary Tract: Negative adrenals. No hydronephrosis or stone. Mildly heterogeneous perfusion of the right kidney, nonspecific and not convincing for pyelonephritis. Chronic thick walled bladder with no distinct plane between it and the pelvic tumor. There is a suprapubic catheter in good position. Masslike appearance in the bladder encompassing an eccentric curvilinear high-density structure, question sacrificed balloon which has collapsed. The adjacent material could be tumor or debris. Overall mass dimensions are 5 cm. Stomach/Bowel: History of anal carcinoma with deep excavated wound lined by nodular enhancing tumor which is excavated the lower pelvis. The anus has been resected or eroded. There is invasion into both sides of the pubic bones and ischial tuberosities. Tumor involves the bladder base and remaining prostate. Descending colostomy which is patent. Vascular/Lymphatic: No acute vascular finding. Compared to prior PET-CT there is stable iliac lymph nodes measuring up to 15 mm short axis on the left. Reproductive:Erosion of the posterior urethra and lower prostate from tumor. Other: No ascites or pneumoperitoneum. Musculoskeletal: Extensive bony erosion is noted above. No superimposed inflammation is seen to implicate superimposed osteomyelitis. There is thickening of the right more than left sacral plexus compatible with perineural tumor spread. A new deposit is seen in the right intrinsic back muscles, measuring 15 mm. There is also a new and not clearly contiguous tumor deposit in the left lower gluteal musculature. IMPRESSION: 1. Deeply excavated anal carcinoma involving the pelvic viscera and bilateral bony pelvis. To the clinical question, no evidence of superimposed osteomyelitis or soft tissue infection. There has been progression of metastatic disease with new soft tissue deposit  and mildly enlarging pulmonary nodules. There is also evidence of perineural tumor spread at the bilateral sacral plexus. 2. Masslike abnormality with high-density component in the bladder which was not seen on 03/22/2019 study. Has there been a sacrificed suprapubic catheter since prior? Electronically Signed   By: JMonte FantasiaM.D.   On: 06/17/2019 11:57   DG Chest Port 1 View  Result Date: 06/16/2019 CLINICAL DATA:  Pt from home via EMS-Pt reports to EMS that pt increased weakness over the past several days. Pt has sacral wound that is malodorous. EXAM:  PORTABLE CHEST 1 VIEW COMPARISON:  Chest radiograph 03/09/2019 FINDINGS: The cardiomediastinal contours are within normal limits. There are innumerable bilateral pulmonary nodules which have increased in size and number compared to the prior radiograph. No focal consolidation no pneumothorax or significant pleural effusion. No acute finding in the visualized skeleton. IMPRESSION: Innumerable bilateral pulmonary nodules which have increased in size and number compared to the prior radiograph, likely representing metastatic disease. Electronically Signed   By: Audie Pinto M.D.   On: 06/16/2019 19:33     CT ABDOMEN PELVIS W CONTRAST  Result Date: 06/17/2019 CLINICAL DATA:  Sepsis with extensive sacral wound, evaluate for osteomyelitis. EXAM: CT ABDOMEN AND PELVIS WITH CONTRAST TECHNIQUE: Multidetector CT imaging of the abdomen and pelvis was performed using the standard protocol following bolus administration of intravenous contrast. CONTRAST:  125m OMNIPAQUE IOHEXOL 300 MG/ML  SOLN COMPARISON:  PET CT 03/22/2019 FINDINGS: Lower chest: Numerous pulmonary nodules compatible with metastatic disease. When compared to PET-CT 03/22/2019 there has been growth. The dominant nodule at the right base measures 11 mm compared to 8 mm previously. Hepatobiliary: No focal liver abnormality.No evidence of biliary obstruction or stone. Pancreas: Unremarkable.  Spleen: Unremarkable. Adrenals/Urinary Tract: Negative adrenals. No hydronephrosis or stone. Mildly heterogeneous perfusion of the right kidney, nonspecific and not convincing for pyelonephritis. Chronic thick walled bladder with no distinct plane between it and the pelvic tumor. There is a suprapubic catheter in good position. Masslike appearance in the bladder encompassing an eccentric curvilinear high-density structure, question sacrificed balloon which has collapsed. The adjacent material could be tumor or debris. Overall mass dimensions are 5 cm. Stomach/Bowel: History of anal carcinoma with deep excavated wound lined by nodular enhancing tumor which is excavated the lower pelvis. The anus has been resected or eroded. There is invasion into both sides of the pubic bones and ischial tuberosities. Tumor involves the bladder base and remaining prostate. Descending colostomy which is patent. Vascular/Lymphatic: No acute vascular finding. Compared to prior PET-CT there is stable iliac lymph nodes measuring up to 15 mm short axis on the left. Reproductive:Erosion of the posterior urethra and lower prostate from tumor. Other: No ascites or pneumoperitoneum. Musculoskeletal: Extensive bony erosion is noted above. No superimposed inflammation is seen to implicate superimposed osteomyelitis. There is thickening of the right more than left sacral plexus compatible with perineural tumor spread. A new deposit is seen in the right intrinsic back muscles, measuring 15 mm. There is also a new and not clearly contiguous tumor deposit in the left lower gluteal musculature. IMPRESSION: 1. Deeply excavated anal carcinoma involving the pelvic viscera and bilateral bony pelvis. To the clinical question, no evidence of superimposed osteomyelitis or soft tissue infection. There has been progression of metastatic disease with new soft tissue deposit and mildly enlarging pulmonary nodules. There is also evidence of perineural tumor  spread at the bilateral sacral plexus. 2. Masslike abnormality with high-density component in the bladder which was not seen on 03/22/2019 study. Has there been a sacrificed suprapubic catheter since prior? Electronically Signed   By: JMonte FantasiaM.D.   On: 06/17/2019 11:57   DG Chest Port 1 View  Result Date: 06/16/2019 CLINICAL DATA:  Pt from home via EMS-Pt reports to EMS that pt increased weakness over the past several days. Pt has sacral wound that is malodorous. EXAM: PORTABLE CHEST 1 VIEW COMPARISON:  Chest radiograph 03/09/2019 FINDINGS: The cardiomediastinal contours are within normal limits. There are innumerable bilateral pulmonary nodules which have increased in size and number compared to  the prior radiograph. No focal consolidation no pneumothorax or significant pleural effusion. No acute finding in the visualized skeleton. IMPRESSION: Innumerable bilateral pulmonary nodules which have increased in size and number compared to the prior radiograph, likely representing metastatic disease. Electronically Signed   By: Audie Pinto M.D.   On: 06/16/2019 19:33   Assessment and Plan:  Dylan [name] y.o.malewith medical history significant for non-Hodgkin lymphoma (treated in 1999 with chemo alone) who presents for follow up of metastatic anal cancer. His course has been markedly complicated and handled at a multitude of different institutions.   Mr. Elmore is now admitted to the hospital due to septic shock due to worsening chronic sacral wound.  He remains on IV antibiotics.  Cultures are negative to date.  He is currently afebrile and hypotension is improving.  He received 1 unit of PRBCs this admission with improvement of his hemoglobin.  He also has mild thrombocytosis which is likely reactive.  His clinical picture is stable and similar to prior and he is still not a candidate for systemic therapy.  He still has intermittent bleeding from his wound and it will be difficult to get  his hemoglobin to levels to make treatment feasible.  Additionally, he has a high risk of a wound infection and treatment would weaken his immune system.  His performance status is also poor.  He has been seen by the palliative care team for ongoing discussions of goals of care.  He continues to desire aggressive treatment of his medical conditions.  #Metastatic Anal Cancer --last PET CT scan on 03/22/2019 showed extensive FDG avid tumor within the lower pelvis involving bilateral pelvic sidewalls and perineum. Additionally there were multiple bilateral pulmonary nodules are identified concerning for metastatic disease. --CT of the abdomen pelvis with contrast performed on 06/17/2019 showed deeply excavated anal carcinoma involving the pelvic viscera and bilateral bony pelvis, there has been progression of metastatic disease with new soft tissue deposit and mildly enlarging pulmonary nodules as well as evidence of perineural tumor spread at the bilateral sacral plexus. --He is currently not a treatment candidate for his cancer given his ongoing anemia, poor performance status, and high risk for infection.  --We will continue to reassess following discharge.   #Iron Deficiency Anemia Secondary to Blood Loss --IV iron has been offered in the past which he has declined.  He has been on oral iron in the past which is not currently ordered.  Recommend restarting this.  Prior dose was 325 mg daily. --Most recent ferritin was 24 on 06/17/2019.  We will continue to readdress administration of IV iron with the patient. --patient will require transfusion if Hgb drops below 7.0. Continue to monitor. Hgb >7.0 today  #Symptom Management --Appreciate palliative care consult. --Continue oxycodone 20 mg every 6 hours as needed for pain --continue to monitor  Mikey Bussing, DNP, AGPCNP-BC, AOCNP

## 2019-06-20 NOTE — Progress Notes (Signed)
PROGRESS NOTE    Dylan [name]  MGQ:[id number] DOB: 05/16/1970 DOA: 06/16/2019 PCP: [name], MD   Brief Narrative: Patient is a 49 year old male with history of Hodgkin's lymphoma, metastatic squamous cell anal carcinoma which was diagnosed in 2018, status post radiation therapy, diverting colostomy, suprapubic cath , history of rectourethral fistula who presents to emergency department complaints of generalized weakness, poor oral intake.  He reported that his urine was cloudy and foul-smelling.  Presented with abdominal pain, rectal pain. Suprapubic catheter was changed by urology at bedside.  Started on broad-spectrum antibiotics for possibility of sepsis. He met sepsis criteria on presentation including tachycardia, tachypnea, leukocytosis.  Started on IV fluids.  Cultures have been sent and pending. He has extensive stage IV sacral wound which is directly associated with advanced anal cancer.  He was seen by general surgery in the past for the evaluation of his sacral wound and it was decided that he is not a surgical candidate.  Wound care has been ordered. Palliative care has been consulted given his extremely poor prognosis, multiple comorbidities. PT recommended skilled nursing facility.   Assessment & Plan:   Active Problems:   Metastatic anal cancer   Sepsis ([place])   Severe protein-calorie malnutrition ([place])   Sacral wound   Osteomyelitis of sacrum ([facility])   Urinary tract infection associated with catheterization of urinary tract, initial encounter ([place])   Goals of care, counseling/discussion   Septic shock due to worsening chronic sacral wound, present on admission: Presented with tachycardia, tachypnea, leukocytosis.  Cultures have been sent and pending,NGTD.  Currently on broad-spectrum antibiotics with vancomycin, zosyn. His blood pressure is chronically low.  Midodrine has been  increased to 10 mg 3 times daily.  Also continue stress dose hydrocortisone.  Currently  afebrile. Blood pressure has improved today.  We will taper the dose of steroids.  Gentle IV fluids will be continued.  Acute on chronic normocytic anemia: From chronic blood loss from the chronic sacral/anal wound  and failure to thrive.  Hemoglobin dropped to the range of 6 and she was transfused with 1 unit of PRBC during this hospitalization Hb stable at 8 today.  He is also severely iron deficient.  We will give him a dose of IV  iron.  He needs to continue iron supplements on discharge.  Failure to thrive: Severe protein calorie malnutrition in the setting of metastatic anal cancer.  Nutrition consulted and following.  Infected anal wound:Has necrotic, foul-smelling rectal/anal wound.General surgery was  consulted on earlier admission  but as per them there is no indication for surgical intervention. Recommended continued wound care therapy . Wound culture is pending, no organisms seen in the Gram stain. He had several surgeries done in the past in different hospitals. He is a status post colostomy and suprapubic catheter placement.Changed suprapubic catheter here.Urology was following.  Metastatic anal cancer: Squamous cell metastatic anal cancer with involvement of the bone and distant mets to the lungs.  Status post radiation therapy, surgery. Status post diverting colostomy on 01/17/2019 at [facility] and radiation therapy in the past.Also had suprapubic catheter placement.  He has history of noncompliance. Follows with oncology.  No longer on treatment.  Goals of care /advanced metastatic cancer: Palliative care consulted this time and was following in the past. Remains full code.   Generalized weakness: Physical therapy consulted and recommended skilled nursing facility on discharge.  TOC consulted.  Nutrition Problem: Severe Malnutrition Etiology: chronic illness(metastatic squamous cell anal carcinoma)  DVT prophylaxis:Lovenox Code Status:  Full Family Communication: Brother on phone on 06/18/2019   status is: Inpatient  Remains inpatient appropriate because:Inpatient level of care appropriate due to severity of illness   Dispo: The patient is from: Home              Anticipated d/c is to: Home  Vs SNF              Anticipated d/c date is: 1-2 days              Patient currently is not medically stable to d/c.  Plan is to monitor him for him for 1 more night.  Will be medically ready for discharge tomorrow if remains hemodynamically stable in the morning.   Consultants: palliative care  Procedures:None  Antimicrobials:  Anti-infectives (From admission, onward)   Start     Dose/Rate Route Frequency Ordered Stop   06/18/19 1400  piperacillin-tazobactam (ZOSYN) IVPB 3.375 g     3.375 g 12.5 mL/hr over 240 Minutes Intravenous Every 8 hours 06/18/19 1011     06/17/19 2200  vancomycin (VANCOREADY) IVPB 750 mg/150 mL     750 mg 150 mL/hr over 60 Minutes Intravenous Every 12 hours 06/17/19 1026     06/17/19 0600  vancomycin (VANCOCIN) IVPB 1000 mg/200 mL premix  Status:  Discontinued     1,000 mg 200 mL/hr over 60 Minutes Intravenous Every 12 hours 06/17/19 0246 06/17/19 1026   06/17/19 0400  metroNIDAZOLE (FLAGYL) IVPB 500 mg  Status:  Discontinued     500 mg 100 mL/hr over 60 Minutes Intravenous Every 8 hours 06/17/19 0106 06/18/19 0735   06/17/19 0400  ceFEPIme (MAXIPIME) 2 g in sodium chloride 0.9 % 100 mL IVPB  Status:  Discontinued     2 g 200 mL/hr over 30 Minutes Intravenous Every 8 hours 06/17/19 0246 06/18/19 0735   06/17/19 0115  ceFEPIme (MAXIPIME) 2 g in sodium chloride 0.9 % 100 mL IVPB  Status:  Discontinued     2 g 200 mL/hr over 30 Minutes Intravenous  Once 06/17/19 0106 06/17/19 0246   06/16/19 1900  ceFEPIme (MAXIPIME) 2 g in sodium chloride 0.9 % 100 mL IVPB     2 g 200 mL/hr over 30 Minutes Intravenous  Once 06/16/19 1849 06/16/19 2041   06/16/19 1900  metroNIDAZOLE (FLAGYL) IVPB 500 mg     500  mg 100 mL/hr over 60 Minutes Intravenous  Once 06/16/19 1849 06/16/19 2102   06/16/19 1900  vancomycin (VANCOCIN) IVPB 1000 mg/200 mL premix     1,000 mg 200 mL/hr over 60 Minutes Intravenous  Once 06/16/19 1849 06/16/19 2221      Subjective:  Patient seen and examined the bedside this morning.  Hemodynamically stable today.  Blood pressure has improved.  Denies any specific complaints.  Mostly bedbound.  Weak.  He feels better today.  He hopes that he will be able to go home tomorrow.  Objective: Vitals:   06/19/19 0424 06/19/19 1533 06/19/19 1923 06/20/19 0552  BP: (!) 95/54 101/67 110/71 (!) 132/94  Pulse: (!) 55 67 62 (!) 57  Resp: 16 17 18 17   Temp: 97.8 F (36.6 C) 97.9 F (36.6 C) 97.9 F (36.6 C) (!) 97.4 F (36.3 C)  TempSrc: Oral Oral Oral Oral  SpO2: 100% 100% 99% 100%  Weight:      Height:        Intake/Output Summary (Last 24 hours) at 06/20/2019 1330 Last data filed at 06/20/2019 0500  Gross per 24 hour  Intake --  Output 1400 ml  Net -1400 ml   Filed Weights   06/17/19 0110  Weight: 54.4 kg    Examination:  General exam: Cachectic, malnourished, chronically looking, weak Respiratory system: Bilateral equal air entry, normal vesicular breath sounds, no wheezes or crackles  Cardiovascular system: S1 & S2 heard, RRR. No JVD, murmurs, rubs, gallops or clicks. Gastrointestinal system: Abdomen is nondistended, soft and nontender. No organomegaly or masses felt. Normal bowel sounds heard.Colostomy Central nervous system: Alert and oriented. No focal neurological deficits. Extremities: No edema, no clubbing ,no cyanosis, distal peripheral pulses palpable. Skin: Stage 4 sacral ulcer,anal ulcers  Data Reviewed: I have personally reviewed following labs and imaging studies  CBC: Recent Labs  Lab 06/16/19 1928 06/16/19 1928 06/17/19 0939 06/18/19 0542 06/18/19 1945 06/19/19 0539 06/20/19 0606  WBC 13.7*  --  10.8* 8.4  --  9.5 8.2  NEUTROABS 11.3*  --   8.8* 7.6  --  8.4* 7.2  HGB 8.1*   < > 7.2* 6.7* 8.6* 8.2* 8.9*  HCT 27.7*   < > 24.5* 23.7* 27.7* 26.4* 29.9*  MCV 67.6*  --  67.7* 70.1*  --  69.3* 73.8*  PLT 609*  --  541* 372  --  395 412*   < > = values in this interval not displayed.   Basic Metabolic Panel: Recent Labs  Lab 06/16/19 1928 06/18/19 0542 06/19/19 0539 06/20/19 0606  NA 133* 130* 133* 135  K 4.2 4.2 3.3* 3.6  CL 99 102 104 106  CO2 26 21* 22 22  GLUCOSE 96 92 97 106*  BUN 15 13 13 12   CREATININE 0.72 0.55* 0.54* 0.66  CALCIUM 11.1* 9.7 9.5 9.2  MG  --  1.9  --   --    GFR: Estimated Creatinine Clearance: 84.1 mL/min (by C-G formula based on SCr of 0.66 mg/dL). Liver Function Tests: Recent Labs  Lab 06/16/19 1928 06/18/19 0542  AST 11* 17  ALT 11 11  ALKPHOS 78 57  BILITOT 0.5 0.5  PROT 7.5 6.1*  ALBUMIN 2.0* 1.8*   No results for input(s): LIPASE, AMYLASE in the last 168 hours. No results for input(s): AMMONIA in the last 168 hours. Coagulation Profile: Recent Labs  Lab 06/16/19 1928  INR 1.2   Cardiac Enzymes: No results for input(s): CKTOTAL, CKMB, CKMBINDEX, TROPONINI in the last 168 hours. BNP (last 3 results) No results for input(s): PROBNP in the last 8760 hours. HbA1C: No results for input(s): HGBA1C in the last 72 hours. CBG: No results for input(s): GLUCAP in the last 168 hours. Lipid Profile: No results for input(s): CHOL, HDL, LDLCALC, TRIG, CHOLHDL, LDLDIRECT in the last 72 hours. Thyroid Function Tests: No results for input(s): TSH, T4TOTAL, FREET4, T3FREE, THYROIDAB in the last 72 hours. Anemia Panel: No results for input(s): VITAMINB12, FOLATE, FERRITIN, TIBC, IRON, RETICCTPCT in the last 72 hours. Sepsis Labs: Recent Labs  Lab 06/16/19 1846 06/16/19 1944  PROCALCITON  --  0.15  LATICACIDVEN 1.6  --     Recent Results (from the past 240 hour(s))  Urine culture     Status: Abnormal   Collection Time: 06/16/19  6:46 PM   Specimen: In/Out Cath Urine  Result  Value Ref Range Status   Specimen Description   Final    IN/OUT CATH URINE Performed at [facility], [place] [address], [place], [place] [address]    Special Requests   Final    NONE Performed  at [facility], [place] [address] [place], [place] [address]    Culture MULTIPLE SPECIES PRESENT, SUGGEST RECOLLECTION (A)  Final   Report Status 06/17/2019 FINAL  Final  Blood Culture (routine x 2)     Status: None (Preliminary result)   Collection Time: 06/16/19  6:51 PM   Specimen: BLOOD  Result Value Ref Range Status   Specimen Description   Final    BLOOD RIGHT ARM Performed at [place] [address], [place], [place] [address]    Special Requests   Final    BOTTLES DRAWN AEROBIC AND ANAEROBIC Blood Culture adequate volume Performed at [facility] [address] [place], [place] [address]    Culture   Final    NO GROWTH 3 DAYS Performed at [facility], [place] [address] [place], [place] [address]    Report Status PENDING  Incomplete  Blood Culture (routine x 2)     Status: None (Preliminary result)   Collection Time: 06/16/19  7:28 PM   Specimen: BLOOD RIGHT FOREARM  Result Value Ref Range Status   Specimen Description   Final    BLOOD RIGHT FOREARM Performed at [place] [address], [place], [place] [address]    Special Requests   Final    BOTTLES DRAWN AEROBIC AND ANAEROBIC Blood Culture adequate volume Performed at [facility] [address], [place], [place] [address]    Culture   Final    NO GROWTH 3 DAYS Performed at [facility], [place] [address] [place],  [address]    Report Status PENDING  Incomplete  Respiratory Panel by RT PCR (Flu A&B, Covid) - Nasopharyngeal Swab     Status: None   Collection Time: 06/16/19 10:06 PM   Specimen: Nasopharyngeal Swab  Result Value Ref Range Status   SARS Coronavirus 2 by RT PCR NEGATIVE  NEGATIVE Final    Comment: (NOTE) SARS-CoV-2 target nucleic acids are NOT DETECTED. The SARS-CoV-2 RNA is generally detectable in upper respiratoy specimens during the acute phase of infection. The lowest concentration of SARS-CoV-2 viral copies this assay can detect is 131 copies/mL. A negative result does not preclude SARS-Cov-2 infection and should not be used as the sole basis for treatment or other patient management decisions. A negative result may occur with  improper specimen collection/handling, submission of specimen other than nasopharyngeal swab, presence of viral mutation(s) within the areas targeted by this assay, and inadequate number of viral copies (<131 copies/mL). A negative result must be combined with clinical observations, patient history, and epidemiological information. The expected result is Negative. Fact Sheet for Patients:  PinkCheek.be Fact Sheet for Healthcare Providers:  GravelBags.it This test is not yet ap proved or cleared by the [place] FDA and  has been authorized for detection and/or diagnosis of SARS-CoV-2 by FDA under an Emergency Use Authorization (EUA). This EUA will remain  in effect (meaning this test can be used) for the duration of the COVID-19 declaration under Section 564(b)(1) of the Act, 21 U.S.C. section 360bbb-3(b)(1), unless the authorization is terminated or revoked sooner.    Influenza A by PCR NEGATIVE NEGATIVE Final   Influenza B by PCR NEGATIVE NEGATIVE Final    Comment: (NOTE) The Xpert Xpress SARS-CoV-2/FLU/RSV assay is intended as an aid in  the diagnosis of influenza from Nasopharyngeal swab specimens and  should not be used as a sole basis for treatment. Nasal washings and  aspirates  are unacceptable for Xpert Xpress SARS-CoV-2/FLU/RSV  testing. Fact Sheet for Patients: PinkCheek.be Fact Sheet for Healthcare  Providers: GravelBags.it This test is not yet approved or cleared by the [place] FDA and  has been authorized for detection and/or diagnosis of SARS-CoV-2 by  FDA under an Emergency Use Authorization (EUA). This EUA will remain  in effect (meaning this test can be used) for the duration of the  Covid-19 declaration under Section 564(b)(1) of the Act, 21  U.S.C. section 360bbb-3(b)(1), unless the authorization is  terminated or revoked. Performed at [facility], [place] [address], [place], [place] [address]   Aerobic/Anaerobic Culture (surgical/deep wound)     Status: None (Preliminary result)   Collection Time: 06/17/19  7:42 AM   Specimen: Wound  Result Value Ref Range Status   Specimen Description   Final    WOUND SACRAL Performed at [place] [address] [place], [place] [address]    Special Requests   Final    NONE Performed at [facility], [place] [address] [place], [place] [address]    Gram Stain   Final    NO WBC SEEN NO ORGANISMS SEEN Performed at [facility], [place] [address] [place], [place] [address]    Culture   Final    RARE NORMAL SKIN FLORA NO ANAEROBES ISOLATED; CULTURE IN PROGRESS FOR 5 DAYS    Report Status PENDING  Incomplete         Radiology Studies: No results found.      Scheduled Meds: . collagenase   Topical Daily  . enoxaparin (LOVENOX) injection  40 mg Subcutaneous Daily  . feeding supplement (ENSURE ENLIVE)  237 mL Oral TID BM  . hydrocortisone sod succinate (SOLU-CORTEF) inj  50 mg Intravenous Q8H  . metoCLOPramide  5 mg Oral TID AC  . midodrine  10 mg Oral TID WC  . multivitamin  1 tablet Oral Daily  . nutrition supplement (JUVEN)  1 packet Oral BID BM  . nystatin   Topical BID   Continuous Infusions: . sodium chloride 75 mL/hr at 06/20/19 0918  . piperacillin-tazobactam (ZOSYN)  IV 3.375 g (06/20/19 0509)  . vancomycin 750 mg  (06/20/19 0920)     LOS: 3 days    Time spent: 25 mins,More than 50% of that time was spent in counseling and/or coordination of care.      Shelly Coss, MD [facility] P5/07/2019, 1:30 PM

## 2019-06-20 NOTE — Evaluation (Signed)
Physical Therapy Evaluation Patient Details Name: Dylan [name] MRN: [id number] DOB: 1970-03-09 Today's Date: 06/20/2019   History of Present Illness  49 year-old male with past medical history of Hodgkin's lymphoma,  metastatic squamous anal cancer (dx 2018, S/P radiation therapy, diverting colostomy and suprapubic cath, currently not receiving Tx) who presents to [facility] with complaints of generalized weakness and poor oral intake. Dx of sepsis, stage IV sacral wound.  Clinical Impression  Pt admitted with above diagnosis. Pt is a vague historian, not forthcoming with home set up information nor with prior level of function. +2 mod assist for bed to recliner chair. Pt voiced surprise that he had become, "so weak". Unclear if pt has 24* care available at home, will likely require SNF level of care upon acute DC. Pt not willing to engage in goal setting/DC planning. Pt currently with functional limitations due to the deficits listed below (see PT Problem List). Pt will benefit from skilled PT to increase their independence and safety with mobility to allow discharge to the venue listed below.       Follow Up Recommendations SNF;Supervision/Assistance - 24 hour    Equipment Recommendations  Wheelchair (measurements PT);Wheelchair cushion (measurements PT)    Recommendations for Other Services       Precautions / Restrictions Precautions Precautions: Fall Restrictions Weight Bearing Restrictions: No      Mobility  Bed Mobility Overal bed mobility: Needs Assistance Bed Mobility: Sidelying to Sit   Sidelying to sit: Min assist       General bed mobility comments: increased time, frequent rest breaks, pt not open to suggestions for technique,  Transfers Overall transfer level: Needs assistance Equipment used: 2 person hand held assist Transfers: Sit to/from [facility] Sit to Stand: Mod assist;+2 physical assistance Stand pivot transfers: Mod  assist;+2 physical assistance       General transfer comment: assist to rise and steady, increased time, narrow BOS in standing  Ambulation/Gait             General Gait Details: unable  Stairs            Wheelchair Mobility    Modified Rankin (Stroke Patients Only)       Balance Overall balance assessment: Needs assistance   Sitting balance-Leahy Scale: Fair Sitting balance - Comments: tends to lean onto elbow to L or R, likely to unweight wound on sacrum   Standing balance support: Bilateral upper extremity supported Standing balance-Leahy Scale: Poor Standing balance comment: relies on BUE support                             Pertinent Vitals/Pain Pain Assessment: 0-10 Pain Score: 7  Pain Location: back Pain Descriptors / Indicators: Grimacing;Aching Pain Intervention(s): Limited activity within patient's tolerance;Monitored during session;Patient requesting pain meds-RN notified    Home Living Family/patient expects to be discharged to:: Private residence Living Arrangements: Children     Home Access: Level entry     Home Layout: One [place]: Environmental consultant - 2 wheels;Shower seat      Prior Function           Comments: walked without AD PTA, pt vague historian, not forthcoming with home information, stated he lives with his daughter who works and that he has friends that can come to assist him at home, unclear if 1* assistance is available     Journalist, newspaper  Extremity/Trunk Assessment   Upper Extremity Assessment Upper Extremity Assessment: Generalized weakness    Lower Extremity Assessment Lower Extremity Assessment: Generalized weakness    Cervical / Trunk Assessment Cervical / Trunk Assessment: Kyphotic  Communication   Communication: No difficulties  Cognition Arousal/Alertness: Awake/alert Behavior During Therapy: Restless Overall Cognitive Status: No family/caregiver present to determine baseline  cognitive functioning                                 General Comments: ~50% of the time pt did not respond to questions; he is able to follow 1 step commands      General Comments      Exercises     Assessment/Plan    PT Assessment Patient needs continued PT services  PT Problem List Decreased strength;Decreased mobility;Decreased balance;Pain;Decreased activity tolerance       PT Treatment Interventions Therapeutic activities;Therapeutic exercise;Functional mobility training;Balance training;Patient/family education    PT Goals (Current goals can be found in the Care Plan section)  Acute Rehab PT Goals Patient Stated Goal: "I don't care". PT Goal Formulation: Patient unable to participate in goal setting Time For Goal Achievement: 07/04/19 Potential to Achieve Goals: Poor    Frequency Min 2X/week   Barriers to discharge        Co-evaluation               AM-PAC PT "6 Clicks" Mobility  Outcome Measure Help needed turning from your back to your side while in a flat bed without using bedrails?: A Little Help needed moving from lying on your back to sitting on the side of a flat bed without using bedrails?: A Little Help needed moving to and from a bed to a chair (including a wheelchair)?: A Lot Help needed standing up from a chair using your arms (e.g., wheelchair or bedside chair)?: A Lot Help needed to walk in hospital room?: Total Help needed climbing 3-5 steps with a railing? : Total 6 Click Score: 12    End of Session Equipment Utilized During Treatment: Gait belt Activity Tolerance: Patient limited by fatigue;Patient limited by pain Patient left: in chair;with call bell/phone within reach;with chair alarm set Nurse Communication: Mobility status PT Visit Diagnosis: Difficulty in walking, not elsewhere classified (R26.2);Pain;Muscle weakness (generalized) (M62.81)    Time: [id number] PT Time Calculation (min) (ACUTE ONLY): 22  min   Charges:   PT Evaluation $PT Eval Moderate Complexity: 1 Mod          Philomena Doheny PT 06/20/2019  Acute Rehabilitation Services Pager [phone] Office [phone]

## 2019-06-20 NOTE — Progress Notes (Signed)
PT Cancellation Note  Patient Details Name: Dylan [name] MRN: [id number] DOB: 1970-04-16   Cancelled Treatment:    Reason Eval/Treat Not Completed: Patient declined, no reason specified(pt requested PT attempt later today.)   Philomena Doheny PT 06/20/2019  Acute Rehabilitation Services Pager [phone] Office [phone]

## 2019-06-20 NOTE — Progress Notes (Signed)
Daily Progress Note   Patient Name: Dylan [name]       Date: 06/20/2019 DOB: [birth year]  Age: 49 y.o. MRN#: [id number] Attending Physician: Shelly Coss, MD Primary Care Physician: [name], MD Admit Date: 06/16/2019  Reason for Consultation/Follow-up: Establishing goals of care  Subjective: I met again today with Mr. Ottaway.  He continues to focus on goal of getting strong enough and improving nutrition with hope that he can receive further disease modifying therapy.    Discussed symptoms again today to see if we need to consider any further adjustments prior to his discharge.  He denies any uncontrolled pain, SOB, anxiety, nausea, or other symptoms at this point.  Length of Stay: 3  Current Medications: Scheduled Meds:  . collagenase   Topical Daily  . enoxaparin (LOVENOX) injection  40 mg Subcutaneous Daily  . feeding supplement (ENSURE ENLIVE)  237 mL Oral TID BM  . hydrocortisone sod succinate (SOLU-CORTEF) inj  50 mg Intravenous Q8H  . metoCLOPramide  5 mg Oral TID AC  . midodrine  10 mg Oral TID WC  . multivitamin  1 tablet Oral Daily  . nutrition supplement (JUVEN)  1 packet Oral BID BM  . nystatin   Topical BID    Continuous Infusions: . sodium chloride 75 mL/hr at 06/20/19 1351  . piperacillin-tazobactam (ZOSYN)  IV 3.375 g (06/20/19 2140)  . vancomycin 750 mg (06/20/19 2140)    PRN Meds: acetaminophen **OR** acetaminophen, ondansetron **OR** ondansetron (ZOFRAN) IV, oxyCODONE  Physical Exam         General: Alert, awake, in no acute distress. Cachectic. HEENT: No bruits, no goiter, no JVD Heart: Regular rate and rhythm. No murmur appreciated. Lungs: Good air movement, clear Abdomen: Soft, nontender, nondistended, positive bowel sounds. Colostomy noted.   Ext: No significant edema Skin: Warm and dry Neuro: Grossly intact, nonfocal.   Vital Signs: BP 115/80   Pulse 63   Temp 98 F (36.7 C) (Oral)   Resp 16   Ht _0  (1.854 m)   Wt 54.4 kg   SpO2 100%   BMI 15.83 kg/m  SpO2: SpO2: 100 % O2 Device: O2 Device: Room Air O2 Flow Rate:    Intake/output summary:   Intake/Output Summary (Last 24 hours) at 06/20/2019 2208 Last data filed at 06/20/2019 1849 Gross per  24 hour  Intake 1520.93 ml  Output 1850 ml  Net -329.07 ml   LBM: Last BM Date: 06/20/19 Baseline Weight: Weight: 54.4 kg Most recent weight: Weight: 54.4 kg       Palliative Assessment/Data:    Flowsheet Rows     Most Recent Value  Intake Tab  Referral Department  Hospitalist  Unit at Time of Referral  Med/Surg Unit  Palliative Care Primary Diagnosis  Cancer  Date Notified  06/17/19  Palliative Care Type  Return patient Palliative Care  Reason for referral  Clarify Goals of Care  Date of Admission  06/16/19  Date first seen by Palliative Care  06/18/19  # of days Palliative referral response time  1 Day(s)  # of days IP prior to Palliative referral  1  Clinical Assessment  Palliative Performance Scale Score  30%  Psychosocial & Spiritual Assessment  Palliative Care Outcomes  Patient/Family meeting held?  Yes  Who was at the meeting?  Patient  Palliative Care Outcomes  Clarified goals of care      Patient Active Problem List   Diagnosis Date Noted  . Sacral wound 06/17/2019  . Osteomyelitis of sacrum ([place]) 06/17/2019  . Urinary tract infection associated with catheterization of urinary tract, initial encounter ([place]) 06/17/2019  . Goals of care, counseling/discussion 06/17/2019  . Pressure injury of skin 03/11/2019  . Wound discharge 03/09/2019  . Cancer cachexia ([place]) 02/06/2019  . Perforation of rectum from anal cancer s/p diverting colostomy 01/16/2019 02/06/2019  . Anal cellulitis 02/05/2019  . 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 Colostomy - diverting loop colostomy in place Dec 2020 02/05/2019  . Suprapubic tube in place Dec 2020 for rectourethral fistula 02/05/2019  . Sepsis due to undetermined organism ([place]) 02/04/2019  . Microcytic anemia 02/04/2019  . Severe protein-calorie malnutrition ([place]) 10/20/2018  . Hypotension 10/18/2018  . Tachycardia 10/18/2018  . Anemia 10/18/2018  . Iron deficiency 10/18/2018  . Sepsis ([place]) 10/18/2018  . Cancer associated pain 02/23/2017  . Metastatic anal cancer 02/10/2014    Palliative Care Assessment & Plan   Recommendations/Plan:  Mr. Rumery desires continuation of full code/full scope treatment.  Plan is for discharge tomorrow.  He denies any uncontrolled symptoms and does not feel need for any changes to his current symptom regimen.    Discussed again that with his poor nutrition and non-healing wound he is not likely to be a candidate for further treatment for his cancer moving forward.  He states that his goal remains to improve his nutrition and functional status to the point of being able to receive immunotherapy.   Goals of Care and Additional Recommendations:  Limitations on Scope of Treatment: Full Scope Treatment  Code Status:    Code Status Orders  (From admission, onward)         Start     Ordered   06/17/19 0251  Full code  Continuous     06/17/19 0250        Code Status History    Date Active Date Inactive Code Status Order ID Comments User Context   06/17/2019 0111 06/17/2019 0250 Full Code [id number]  Vernelle Emerald, MD ED   03/09/2019 1849 03/14/2019 2136 Full Code [id number]  [place] Eric J, [place] [facility]   02/04/2019 2337 02/09/2019 1735 Full Code [id number]  Reubin Milan, MD ED   10/18/2018 2102 10/21/2018 1553 Full Code [id number]  [name], MD ED   Advance Care Planning Activity  Advance Directive Documentation     Most Recent Value  Type of Advance Directive  Healthcare Power of Attorney, Living will  Pre-existing  out of facility DNR order (yellow form or pink MOST form)  --  "MOST" Form in Place?  --       Prognosis:   Guarded  Discharge Planning:  To Be Determined  Care plan was discussed with patient  Thank you for allowing the Palliative Medicine Team to assist in the care of this patient.   Total Time 30 Prolonged Time Billed No   Greater than 50%  of this time was spent counseling and coordinating care related to the above assessment and plan.  Micheline Rough, MD  Please contact Palliative Medicine Team phone at [phone] for questions and concerns.

## 2019-06-20 NOTE — Care Management Important Message (Signed)
Important Message  Patient Details IM Letter given to Roque Lias SW Case Manager to present to the Patient Name: Dylan [name] MRN: [id number] Date of Birth: 1971-01-07   Medicare Important Message Given:  Yes     Kerin Salen 06/20/2019, 9:44 AM

## 2019-06-20 NOTE — TOC Transition Note (Signed)
Transition of Care [facility]) - CM/SW Discharge Note   Patient Details  Name: YELTSIN HISHMEH MRN: [id number] Date of Birth: [birth year]  [facility] CM/SW Contact:  Trish Mage, LCSW Phone Number: 06/20/2019, 2:46 PM   Clinical Narrative:  Mr Edmison was seen in follow up to PT recommendation of SNF.  He listened respectfully to the rationale, and then politely declined, stating he would prefer to go home and have [facility] in conjunction with RN services.  Was actually more focused on his PCS agency, stating that the first worker he had was wonderful, and then they sent someone else who did not measure up.  I gave him name and number of his agency so that he could give them feedback and make specific requests.  I reached out to his daughter, Dorena Cookey,  to let her know of plan. No questions, no further needs identified.  Patient will be transported home by [facility] as arranged by [facility] worker-likely d/c tomorrow.    Final next level of care: [place] Barriers to Discharge: No Barriers Identified   Patient Goals and CMS Choice Patient states their goals for this hospitalization and ongoing recovery are:: "I have everything I need at this point."      Discharge Placement                       Discharge Plan and Services   Discharge Planning Services: CM Consult Post Acute Care Choice: Home Health                               Social Determinants of Health (SDOH) Interventions     Readmission Risk Interventions No flowsheet data found.

## 2019-06-21 DIAGNOSIS — S31000D Unspecified open wound of lower back and pelvis without penetration into retroperitoneum, subsequent encounter: Secondary | ICD-10-CM

## 2019-06-21 LAB — BASIC METABOLIC PANEL
Anion gap: 7 (ref 5–15)
BUN: 10 mg/dL (ref 6–20)
CO2: 23 mmol/L (ref 22–32)
Calcium: 8.8 mg/dL — ABNORMAL LOW (ref 8.9–10.3)
Chloride: 106 mmol/L (ref 98–111)
Creatinine, Ser: 0.52 mg/dL — ABNORMAL LOW (ref 0.61–1.24)
GFR calc Af Amer: >60 mL/min (ref >60)
GFR calc non Af Amer: >60 mL/min (ref >60)
Glucose, Bld: 93 mg/dL (ref 70–99)
Potassium: 2.9 mmol/L — ABNORMAL LOW (ref 3.5–5.1)
Sodium: 136 mmol/L (ref 135–145)

## 2019-06-21 LAB — CBC WITH DIFFERENTIAL/PLATELET
Abs Immature Granulocytes: 0.05 10*3/uL (ref 0.00–0.07)
Basophils Absolute: 0 10*3/uL (ref 0.0–0.1)
Basophils Relative: 0 %
Eosinophils Absolute: 0 10*3/uL (ref 0.0–0.5)
Eosinophils Relative: 0 %
HCT: 27.4 % — ABNORMAL LOW (ref 39.0–52.0)
Hemoglobin: 8.1 g/dL — ABNORMAL LOW (ref 13.0–17.0)
Immature Granulocytes: 1 %
Lymphocytes Relative: 7 %
Lymphs Abs: 0.7 10*3/uL (ref 0.7–4.0)
MCH: 21.6 pg — ABNORMAL LOW (ref 26.0–34.0)
MCHC: 29.6 g/dL — ABNORMAL LOW (ref 30.0–36.0)
MCV: 73.1 fL — ABNORMAL LOW (ref 80.0–100.0)
Monocytes Absolute: 0.2 10*3/uL (ref 0.1–1.0)
Monocytes Relative: 3 %
Neutro Abs: 8.4 10*3/uL — ABNORMAL HIGH (ref 1.7–7.7)
Neutrophils Relative %: 89 %
Platelets: 420 10*3/uL — ABNORMAL HIGH (ref 150–400)
RBC: 3.75 MIL/uL — ABNORMAL LOW (ref 4.22–5.81)
RDW: 23.9 % — ABNORMAL HIGH (ref 11.5–15.5)
WBC: 9.4 10*3/uL (ref 4.0–10.5)
nRBC: 0 % (ref 0.0–0.2)

## 2019-06-21 LAB — POTASSIUM: Potassium: 3.2 mmol/L — ABNORMAL LOW (ref 3.5–5.1)

## 2019-06-21 MED ORDER — POTASSIUM CHLORIDE CRYS ER 20 MEQ PO TBCR
40.0000 meq | EXTENDED_RELEASE_TABLET | ORAL | Status: AC
  Administered 2019-06-21 (×3): 40 meq via ORAL
  Filled 2019-06-21 (×3): qty 2

## 2019-06-21 MED ORDER — FERROUS SULFATE 325 (65 FE) MG PO TABS: 325.0000 mg | ORAL_TABLET | Freq: Every day | ORAL | 0 refills | Status: AC

## 2019-06-21 MED ORDER — MIDODRINE HCL 10 MG PO TABS: 10.0000 mg | ORAL_TABLET | Freq: Three times a day (TID) | ORAL | 1 refills | Status: AC

## 2019-06-21 MED ORDER — POTASSIUM CHLORIDE ER 20 MEQ PO TBCR: 20.0000 meq | EXTENDED_RELEASE_TABLET | Freq: Every day | ORAL | 0 refills | Status: AC

## 2019-06-21 MED ORDER — NYSTATIN 100000 UNIT/GM EX POWD: Freq: Two times a day (BID) | CUTANEOUS | 2 refills | Status: AC

## 2019-06-21 MED ORDER — SODIUM CHLORIDE 0.9 % IV SOLN
510.0000 mg | Freq: Once | INTRAVENOUS | Status: AC
  Administered 2019-06-21: 510 mg via INTRAVENOUS
  Filled 2019-06-21: qty 510

## 2019-06-21 MED ORDER — AMOXICILLIN-POT CLAVULANATE 875-125 MG PO TABS: 1.0000 | ORAL_TABLET | Freq: Two times a day (BID) | ORAL | 0 refills | Status: AC

## 2019-06-21 NOTE — Progress Notes (Signed)
Pharmacy Antibiotic Note  Dylan [name] is a 49 y.o. male admitted on 06/16/2019 with sepsis.  Pharmacy has been consulted for vancomycin and piperacillin/tazobactam dosing.  Pt has PMH significant for Hodgkin's lymphoma, metastatic squamous cell anal cancer presenting with weakness and decreased oral intake. Broad spectrum antibiotics started for sepsis, possible source: stage IV sacral wound with concern for OM.  Today, 06/21/19 - Day #6 broad spectrum antibiotics. - WBC WNL - SCr WNL, CrCl ~80 mL/min - Afebrile  Plan:  Piperacillin/tazobactam 3.375 g IV q8h EI  Continue vancomycin 750 mg IV q12h.    Goal AUC 400-550  Follow renal function and culture data. Check vancomycin levels at steady state if indicated  Follow up plans for discharge today.  Height: 6\' 1"  (185.4 cm) Weight: 58.6 kg (129 lb 3 oz) IBW/kg (Calculated) : 79.9  Temp (24hrs), Avg:97.8 F (36.6 C), Min:97.6 F (36.4 C), Max:98 F (36.7 C)  Recent Labs  Lab 06/16/19 1846 06/16/19 1928 06/16/19 1928 06/17/19 0939 06/18/19 0542 06/19/19 0539 06/20/19 0606 06/21/19 0513  WBC  --  13.7*   < > 10.8* 8.4 9.5 8.2 9.4  CREATININE  --  0.72  --   --  0.55* 0.54* 0.66 0.52*  LATICACIDVEN 1.6  --   --   --   --   --   --   --    < > = values in this interval not displayed.    Estimated Creatinine Clearance: 90.5 mL/min (A) (by C-G formula based on SCr of 0.52 mg/dL (L)).    No Known Allergies  Antimicrobials this admission: vancomycin 5/2 >>  Piperacillin/tazobactam 5/4 >> cefepime 5/2 >> 5/4  Metronidazole 5/2 >> 5/4  Dose adjustments this admission: 5/3 vanc 1 g IV q12h --> 750 mg IV q12h  Microbiology results: 5/2 SARS Coronavirus 2 by RT PCR: neg 5/2 Urine: multiple species present, suggest recollection 5/2 BCx: ngtd 5/3 sacral wound: rare normal skin flora  Gretta Arab PharmD, BCPS Clinical Pharmacist [facility] main pharmacy [phone] 06/21/2019 9:50 AM

## 2019-06-21 NOTE — Discharge Summary (Signed)
Physician Discharge Summary  Dylan [name]:[id number] DOB: 1970/12/01 DOA: 06/16/2019  PCP: [name], MD  Admit date: 06/16/2019 Discharge date: 06/21/2019  Admitted From: Home Disposition:  Home  Discharge Condition:Stable CODE STATUS:FULL Diet recommendation: Regular Brief/Interim Summary:  Patient is a 49 year old male with history of Hodgkin's lymphoma, metastatic squamous cellanalcarcinoma which was diagnosed in 2018, status post radiation therapy, diverting colostomy, suprapubic cath,history of rectourethral fistulawho presents to emergency department complaints of generalized weakness, poor oral intake. He reported that his urine was cloudy and foul-smelling. Presented with abdominal pain, rectal pain.Suprapubic catheter was changed by urology at bedside. Started on broad-spectrum antibiotics for possibility of sepsis. He met sepsis criteria on presentation including tachycardia, tachypnea, leukocytosis. Started on IV fluids. Cultures have been sent and they did not show any growth. He has extensive stage IV sacral wound which is directlyassociated with advanced analcancer. He was seen by general surgery in the past for the evaluation of his sacral wound and it was decided that he is not a surgical candidate. Wound care was following here. Palliative care has been consulted given his extremely poor prognosis, multiple comorbidities.  Patient wanted to continue all possible treatment for now. Currently he has remained hemodynamically stable for last few days.  Cultures have not shown any growth so he is medically stable for discharge to home today with oral antibiotics. PT recommended skilled nursing facility but he declined.  Home health arranged.  Following problems were addressed during his hospitalization:  Septic shock due to worsening chronic sacral wound, present on admission: Presented with tachycardia, tachypnea, leukocytosis.  Cultures have been sent and  there is NGTD.  He was treated with  broad-spectrum antibiotics with vancomycin, zosyn. His blood pressure is chronically low.  Midodrine has been  increased to 10 mg 3 times daily. Currently afebrile. Blood pressure has improved today. Abx changed to oral on discharge.  Acute on chronic normocytic anemia: From chronic blood loss from the chronic sacral/anal wound  and failure to thrive.  Hemoglobin dropped to the range of 6 and she was transfused with 1 unit of PRBC during this hospitalization Hb stable at 8 today.  He is also severely iron deficient.  We gave him a dose of IV  iron.  He needs to continue iron supplements on discharge.  Failure to thrive: Severe protein calorie malnutrition in the setting of metastatic anal cancer. Nutrition was consulted and following.  Infected anal wound:Has necrotic, foul-smelling rectal/anal wound.General surgery was  consulted on earlier admission  but as per them there is no indication for surgical intervention. Recommended continued wound care therapy at home . Wound culture did not show any growth. He had several surgeries done in the past in different hospitals. He is a status post colostomy and suprapubic catheter placement.Changed suprapubic catheterhere.Urology was following.  Metastatic anal cancer: Squamous cell metastatic anal cancer with involvement of the bone and distant mets to the lungs.  Status post radiation therapy, surgery. Status post diverting col[facility] on 01/17/2019 at [facility] and radiation therapy in the past.Also had suprapubic catheter placement.  He has history of noncompliance. Follows with oncology.  No longer on treatment.  Oncology was following here.  Recommended outpatient follow-up.  Goals of care /advanced metastatic cancer: Palliative care consulted this time and was following in the past. Remains full code.  Generalized weakness: Physical therapy consulted and recommended skilled nursing  facility on discharge.  He opted for home with home health.    Discharge Diagnoses:  Active Problems:   Metastatic anal cancer   Sepsis ([place])   Severe protein-calorie malnutrition ([place])   Sacral wound   Osteomyelitis of sacrum ([facility])   Urinary tract infection associated with catheterization of urinary tract, initial encounter ([place])   Goals of care, counseling/discussion    Discharge Instructions  Discharge Instructions    Diet general   Complete by: As directed    Discharge instructions   Complete by: As directed    1) Please take prescribed medications as instructed. 2)Follow up with your PCP in a week. 3)Follow up with your oncologist as an outpatient.   Increase activity slowly   Complete by: As directed      Allergies as of 06/21/2019   No Known Allergies     Medication List    TAKE these medications   acetaminophen 325 MG tablet Commonly known as: TYLENOL Take 650 mg by mouth every 6 (six) hours as needed for pain.   amoxicillin-clavulanate 875-125 MG tablet Commonly known as: Augmentin Take 1 tablet by mouth 2 (two) times daily for 7 days.   ferrous sulfate 325 (65 FE) MG tablet Take 1 tablet (325 mg total) by mouth daily with breakfast.   MELATONIN PO Take 1 capsule by mouth at bedtime as needed (sleep).   metroNIDAZOLE 0.75 % gel Commonly known as: METROGEL Apply 1 application topically daily as needed (rosacea).   midodrine 10 MG tablet Commonly known as: PROAMATINE Take 1 tablet (10 mg total) by mouth 3 (three) times daily with meals. What changed:   medication strength  See the new instructions.   nystatin powder Commonly known as: MYCOSTATIN/NYSTOP Apply topically 2 (two) times daily. Apply to the macerated area around groin,scrotal area   Oxycodone HCl 20 MG Tabs Take 1-1.5 tablets (20-30 mg total) by mouth See admin instructions. Take 1 tablet in the morning, take 1.5 tablets at lunch, and take 1 tablet at night What changed: additional  instructions   Potassium Chloride ER 20 MEQ Tbcr Take 20 mEq by mouth daily for 5 days. Start taking on: Jun 22, 2019      Follow-up Information    [facility] Follow up.   Specialty: Home Health Services Why: This is the new home heatlh agency that will be providing nursing services in the home.  They will see you the day after d/c frm the hospital Contact information: [place] [address] [phone]        [name], MD. Schedule an appointment as soon as possible for a visit in 1 week(s).   Specialties: Family Medicine, Sports Medicine Contact information: [place] [address] [phone]          No Known Allergies  Consultations: Oncology  Procedures/Studies: CT ABDOMEN PELVIS W CONTRAST  Result Date: 06/17/2019 CLINICAL DATA:  Sepsis with extensive sacral wound, evaluate for osteomyelitis. EXAM: CT ABDOMEN AND PELVIS WITH CONTRAST TECHNIQUE: Multidetector CT imaging of the abdomen and pelvis was performed using the standard protocol following bolus administration of intravenous contrast. CONTRAST:  170m OMNIPAQUE IOHEXOL 300 MG/ML  SOLN COMPARISON:  PET CT 03/22/2019 FINDINGS: Lower chest: Numerous pulmonary nodules compatible with metastatic disease. When compared to PET-CT 03/22/2019 there has been growth. The dominant nodule at the right base measures 11 mm compared to 8 mm previously. Hepatobiliary: No focal liver abnormality.No evidence of biliary obstruction or stone. Pancreas: Unremarkable. Spleen: Unremarkable. Adrenals/Urinary Tract: Negative adrenals. No hydronephrosis or stone. Mildly heterogeneous perfusion of the right kidney,  nonspecific and not convincing for pyelonephritis. Chronic thick walled bladder with no distinct plane between it and the pelvic tumor. There is a suprapubic catheter in good position. Masslike appearance in the bladder encompassing an eccentric curvilinear high-density structure,  question sacrificed balloon which has collapsed. The adjacent material could be tumor or debris. Overall mass dimensions are 5 cm. Stomach/Bowel: History of anal carcinoma with deep excavated wound lined by nodular enhancing tumor which is excavated the lower pelvis. The anus has been resected or eroded. There is invasion into both sides of the pubic bones and ischial tuberosities. Tumor involves the bladder base and remaining prostate. Descending colostomy which is patent. Vascular/Lymphatic: No acute vascular finding. Compared to prior PET-CT there is stable iliac lymph nodes measuring up to 15 mm short axis on the left. Reproductive:Erosion of the posterior urethra and lower prostate from tumor. Other: No ascites or pneumoperitoneum. Musculoskeletal: Extensive bony erosion is noted above. No superimposed inflammation is seen to implicate superimposed osteomyelitis. There is thickening of the right more than left sacral plexus compatible with perineural tumor spread. A new deposit is seen in the right intrinsic back muscles, measuring 15 mm. There is also a new and not clearly contiguous tumor deposit in the left lower gluteal musculature. IMPRESSION: 1. Deeply excavated anal carcinoma involving the pelvic viscera and bilateral bony pelvis. To the clinical question, no evidence of superimposed osteomyelitis or soft tissue infection. There has been progression of metastatic disease with new soft tissue deposit and mildly enlarging pulmonary nodules. There is also evidence of perineural tumor spread at the bilateral sacral plexus. 2. Masslike abnormality with high-density component in the bladder which was not seen on 03/22/2019 study. Has there been a sacrificed suprapubic catheter since prior? Electronically Signed   By: Monte Fantasia M.D.   On: 06/17/2019 11:57   DG Chest Port 1 View  Result Date: 06/16/2019 CLINICAL DATA:  Pt from home via EMS-Pt reports to EMS that pt increased weakness over the past  several days. Pt has sacral wound that is malodorous. EXAM: PORTABLE CHEST 1 VIEW COMPARISON:  Chest radiograph 03/09/2019 FINDINGS: The cardiomediastinal contours are within normal limits. There are innumerable bilateral pulmonary nodules which have increased in size and number compared to the prior radiograph. No focal consolidation no pneumothorax or significant pleural effusion. No acute finding in the visualized skeleton. IMPRESSION: Innumerable bilateral pulmonary nodules which have increased in size and number compared to the prior radiograph, likely representing metastatic disease. Electronically Signed   By: Audie Pinto M.D.   On: 06/16/2019 19:33       Subjective: Patient seen and examined at the bedside this morning.  hemodynamically stable for discharge today to home.  Discharge Exam: Vitals:   06/21/19 0538 06/21/19 0847  BP: 130/84   Pulse: (!) 53   Resp: 18 13  Temp: 97.9 F (36.6 C)   SpO2: 100%    Vitals:   06/20/19 1610 06/20/19 2144 06/21/19 0538 06/21/19 0847  BP: 111/65 115/80 130/84   Pulse: (!) 52 63 (!) 53   Resp: 18 16 18 13   Temp: 97.6 F (36.4 C) 98 F (36.7 C) 97.9 F (36.6 C)   TempSrc: Oral Oral Oral   SpO2: 99% 100% 100%   Weight:    58.6 kg  Height:    6' 1"  (1.854 m)    General: Pt is alert, awake, not in acute distress Cardiovascular: RRR, S1/S2 +, no rubs, no gallops Respiratory: CTA bilaterally, no wheezing, no rhonchi Abdominal:  Soft, NT, ND, bowel sounds +,colostomy Extremities: no edema, no cyanosis Skin:.  Perineal, sacral ulcer    The results of significant diagnostics from this hospitalization (including imaging, microbiology, ancillary and laboratory) are listed below for reference.     Microbiology: Recent Results (from the past 240 hour(s))  Urine culture     Status: Abnormal   Collection Time: 06/16/19  6:46 PM   Specimen: In/Out Cath Urine  Result Value Ref Range Status   Specimen Description   Final    IN/OUT  CATH URINE Performed at [facility], [place] [address], [place], [place] [address]    Special Requests   Final    NONE Performed at [facility], [place] [address], [place], [place] [address]    Culture MULTIPLE SPECIES PRESENT, SUGGEST RECOLLECTION (A)  Final   Report Status 06/17/2019 FINAL  Final  Blood Culture (routine x 2)     Status: None (Preliminary result)   Collection Time: 06/16/19  6:51 PM   Specimen: BLOOD  Result Value Ref Range Status   Specimen Description   Final    BLOOD RIGHT ARM Performed at [place] [address], [place], [place] [address]    Special Requests   Final    BOTTLES DRAWN AEROBIC AND ANAEROBIC Blood Culture adequate volume Performed at [place] [address], [place], [place] [address]    Culture   Final    NO GROWTH 4 DAYS Performed at [facility], [place] [address] [place], [place] [address]    Report Status PENDING  Incomplete  Blood Culture (routine x 2)     Status: None (Preliminary result)   Collection Time: 06/16/19  7:28 PM   Specimen: BLOOD RIGHT FOREARM  Result Value Ref Range Status   Specimen Description   Final    BLOOD RIGHT FOREARM Performed at [place] [address] [place], [place] [address]    Special Requests   Final    BOTTLES DRAWN AEROBIC AND ANAEROBIC Blood Culture adequate volume Performed at [place] [address] [place], [place] [address]    Culture   Final    NO GROWTH 4 DAYS Performed at [facility], [place] [address], [place], [place] [address]    Report Status PENDING  Incomplete  Respiratory Panel by RT PCR (Flu A&B, Covid) - Nasopharyngeal Swab     Status: None   Collection Time: 06/16/19 10:06 PM   Specimen: Nasopharyngeal Swab  Result Value Ref Range Status   SARS Coronavirus 2 by RT PCR NEGATIVE NEGATIVE Final    Comment: (NOTE) SARS-CoV-2 target nucleic  acids are NOT DETECTED. The SARS-CoV-2 RNA is generally detectable in upper respiratoy specimens during the acute phase of infection. The lowest concentration of SARS-CoV-2 viral copies this assay can detect is 131 copies/mL. A negative result does not preclude SARS-Cov-2 infection and should not be used as the sole basis for treatment or other patient management decisions. A negative result may occur with  improper specimen collection/handling, submission of specimen other than nasopharyngeal swab, presence of viral mutation(s) within the areas targeted by this assay, and inadequate number of viral copies (<131 copies/mL). A negative result must be combined with clinical observations, patient history, and epidemiological information. The expected result is Negative. Fact Sheet for Patients:  PinkCheek.be Fact Sheet for Healthcare Providers:  GravelBags.it This test is not yet ap proved or cleared by the [place]  FDA and  has been authorized for detection and/or diagnosis of SARS-CoV-2 by FDA under an Emergency Use Authorization (EUA). This EUA will remain  in effect (meaning this test can be used) for the duration of the COVID-19 declaration under Section 564(b)(1) of the Act, 21 U.S.C. section 360bbb-3(b)(1), unless the authorization is terminated or revoked sooner.    Influenza A by PCR NEGATIVE NEGATIVE Final   Influenza B by PCR NEGATIVE NEGATIVE Final    Comment: (NOTE) The Xpert Xpress SARS-CoV-2/FLU/RSV assay is intended as an aid in  the diagnosis of influenza from Nasopharyngeal swab specimens and  should not be used as a sole basis for treatment. Nasal washings and  aspirates are unacceptable for Xpert Xpress SARS-CoV-2/FLU/RSV  testing. Fact Sheet for Patients: PinkCheek.be Fact Sheet for Healthcare Providers: GravelBags.it This test is not yet  approved or cleared by the [place] FDA and  has been authorized for detection and/or diagnosis of SARS-CoV-2 by  FDA under an Emergency Use Authorization (EUA). This EUA will remain  in effect (meaning this test can be used) for the duration of the  Covid-19 declaration under Section 564(b)(1) of the Act, 21  U.S.C. section 360bbb-3(b)(1), unless the authorization is  terminated or revoked. Performed at [facility], [place] [address] [place], [place] [address]   Aerobic/Anaerobic Culture (surgical/deep wound)     Status: None (Preliminary result)   Collection Time: 06/17/19  7:42 AM   Specimen: Wound  Result Value Ref Range Status   Specimen Description   Final    WOUND SACRAL Performed at [place] [address] [place], [place] [address]    Special Requests   Final    NONE Performed at [facility], [place] [address], [place], [place] [address]    Gram Stain   Final    NO WBC SEEN NO ORGANISMS SEEN Performed at [facility], [place] [address] [place], [place] [address]    Culture   Final    RARE NORMAL SKIN FLORA NO ANAEROBES ISOLATED; CULTURE IN PROGRESS FOR 5 DAYS    Report Status PENDING  Incomplete     Labs: BNP (last 3 results) No results for input(s): BNP in the last 8760 hours. Basic Metabolic Panel: Recent Labs  Lab 06/16/19 1928 06/16/19 1928 06/18/19 0542 06/19/19 0539 06/20/19 0606 06/21/19 0513 06/21/19 1159  NA 133*  --  130* 133* 135 136  --   K 4.2   < > 4.2 3.3* 3.6 2.9* 3.2*  CL 99  --  102 104 106 106  --   CO2 26  --  21* 22 22 23   --   GLUCOSE 96  --  92 97 106* 93  --   BUN 15  --  13 13 12 10   --   CREATININE 0.72  --  0.55* 0.54* 0.66 0.52*  --   CALCIUM 11.1*  --  9.7 9.5 9.2 8.8*  --   MG  --   --  1.9  --   --   --   --    < > = values in this interval not displayed.   Liver Function Tests: Recent Labs  Lab 06/16/19 1928 06/18/19 0542  AST 11* 17  ALT 11 11   ALKPHOS 78 57  BILITOT 0.5 0.5  PROT 7.5 6.1*  ALBUMIN 2.0* 1.8*   No results for input(s): LIPASE, AMYLASE in the last 168 hours. No results for input(s): AMMONIA in  the last 168 hours. CBC: Recent Labs  Lab 06/17/19 0939 06/17/19 0939 06/18/19 0542 06/18/19 1945 06/19/19 0539 06/20/19 0606 06/21/19 0513  WBC 10.8*  --  8.4  --  9.5 8.2 9.4  NEUTROABS 8.8*  --  7.6  --  8.4* 7.2 8.4*  HGB 7.2*   < > 6.7* 8.6* 8.2* 8.9* 8.1*  HCT 24.5*   < > 23.7* 27.7* 26.4* 29.9* 27.4*  MCV 67.7*  --  70.1*  --  69.3* 73.8* 73.1*  PLT 541*  --  372  --  395 412* 420*   < > = values in this interval not displayed.   Cardiac Enzymes: No results for input(s): CKTOTAL, CKMB, CKMBINDEX, TROPONINI in the last 168 hours. BNP: Invalid input(s): POCBNP CBG: No results for input(s): GLUCAP in the last 168 hours. D-Dimer No results for input(s): DDIMER in the last 72 hours. Hgb A1c No results for input(s): HGBA1C in the last 72 hours. Lipid Profile No results for input(s): CHOL, HDL, LDLCALC, TRIG, CHOLHDL, LDLDIRECT in the last 72 hours. Thyroid function studies No results for input(s): TSH, T4TOTAL, T3FREE, THYROIDAB in the last 72 hours.  Invalid input(s): FREET3 Anemia work up No results for input(s): VITAMINB12, FOLATE, FERRITIN, TIBC, IRON, RETICCTPCT in the last 72 hours. Urinalysis    Component Value Date/Time   COLORURINE STRAW (A) 06/16/2019 1846   APPEARANCEUR TURBID (A) 06/16/2019 1846   LABSPEC 1.022 06/16/2019 1846   PHURINE 7.0 06/16/2019 1846   GLUCOSEU NEGATIVE 06/16/2019 1846   HGBUR MODERATE (A) 06/16/2019 1846   BILIRUBINUR NEGATIVE 06/16/2019 1846   KETONESUR 5 (A) 06/16/2019 1846   PROTEINUR >=300 (A) 06/16/2019 1846   NITRITE POSITIVE (A) 06/16/2019 1846   LEUKOCYTESUR MODERATE (A) 06/16/2019 1846   Sepsis Labs Invalid input(s): PROCALCITONIN,  WBC,  LACTICIDVEN Microbiology Recent Results (from the past 240 hour(s))  Urine culture     Status: Abnormal    Collection Time: 06/16/19  6:46 PM   Specimen: In/Out Cath Urine  Result Value Ref Range Status   Specimen Description   Final    IN/OUT CATH URINE Performed at [facility], [place] [address], [place], [place] [address]    Special Requests   Final    NONE Performed at [facility], [place] [address] [place], [place] [address]    Culture MULTIPLE SPECIES PRESENT, SUGGEST RECOLLECTION (A)  Final   Report Status 06/17/2019 FINAL  Final  Blood Culture (routine x 2)     Status: None (Preliminary result)   Collection Time: 06/16/19  6:51 PM   Specimen: BLOOD  Result Value Ref Range Status   Specimen Description   Final    BLOOD RIGHT ARM Performed at [place] [address] [place], [place] [address]    Special Requests   Final    BOTTLES DRAWN AEROBIC AND ANAEROBIC Blood Culture adequate volume Performed at [place] [address] [place], [place] [address]    Culture   Final    NO GROWTH 4 DAYS Performed at [facility], [place] [address], [place],  [address]    Report Status PENDING  Incomplete  Blood Culture (routine x 2)     Status: None (Preliminary result)   Collection Time: 06/16/19  7:28 PM   Specimen: BLOOD RIGHT FOREARM  Result Value Ref Range Status   Specimen Description   Final    BLOOD RIGHT FOREARM Performed at [facility], [address] [place], [place] [address]    Special Requests   Final    BOTTLES DRAWN AEROBIC AND ANAEROBIC Blood Culture adequate volume Performed at [place] [address], [place], [place] [address]    Culture   Final    NO GROWTH 4 DAYS Performed at [facility], [place] [address], [place], [place] [address]    Report Status PENDING  Incomplete  Respiratory Panel by RT PCR (Flu A&B, Covid) - Nasopharyngeal Swab     Status: None   Collection Time: 06/16/19 10:06 PM   Specimen: Nasopharyngeal  Swab  Result Value Ref Range Status   SARS Coronavirus 2 by RT PCR NEGATIVE NEGATIVE Final    Comment: (NOTE) SARS-CoV-2 target nucleic acids are NOT DETECTED. The SARS-CoV-2 RNA is generally detectable in upper respiratoy specimens during the acute phase of infection. The lowest concentration of SARS-CoV-2 viral copies this assay can detect is 131 copies/mL. A negative result does not preclude SARS-Cov-2 infection and should not be used as the sole basis for treatment or other patient management decisions. A negative result may occur with  improper specimen collection/handling, submission of specimen other than nasopharyngeal swab, presence of viral mutation(s) within the areas targeted by this assay, and inadequate number of viral copies (<131 copies/mL). A negative result must be combined with clinical observations, patient history, and epidemiological information. The expected result is Negative. Fact Sheet for Patients:  PinkCheek.be Fact Sheet for Healthcare Providers:  GravelBags.it This test is not yet ap proved or cleared by the [place] FDA and  has been authorized for detection and/or diagnosis of SARS-CoV-2 by FDA under an Emergency Use Authorization (EUA). This EUA will remain  in effect (meaning this test can be used) for the duration of the COVID-19 declaration under Section 564(b)(1) of the Act, 21 U.S.C. section 360bbb-3(b)(1), unless the authorization is terminated or revoked sooner.    Influenza A by PCR NEGATIVE NEGATIVE Final   Influenza B by PCR NEGATIVE NEGATIVE Final    Comment: (NOTE) The Xpert Xpress SARS-CoV-2/FLU/RSV assay is intended as an aid in  the diagnosis of influenza from Nasopharyngeal swab specimens and  should not be used as a sole basis for treatment. Nasal washings and  aspirates are unacceptable for Xpert Xpress SARS-CoV-2/FLU/RSV  testing. Fact Sheet for  Patients: PinkCheek.be Fact Sheet for Healthcare Providers: GravelBags.it This test is not yet approved or cleared by the [place] FDA and  has been authorized for detection and/or diagnosis of SARS-CoV-2 by  FDA under an Emergency Use Authorization (EUA). This EUA will remain  in effect (meaning this test can be used) for the duration of the  Covid-19 declaration under Section 564(b)(1) of the Act, 21  U.S.C. section 360bbb-3(b)(1), unless the authorization is  terminated or revoked. Performed at [facility], [place] [address] [place], [place] [address]   Aerobic/Anaerobic Culture (surgical/deep wound)     Status: None (Preliminary result)   Collection Time: 06/17/19  7:42 AM   Specimen: Wound  Result Value Ref Range Status   Specimen Description   Final    WOUND SACRAL Performed at [place] [address] [place], [place] [address]    Special Requests   Final    NONE Performed at [facility], [place] [address], [place],  [address]    Gram Stain   Final    NO WBC SEEN NO ORGANISMS SEEN Performed at [facility], [address] [place], [place] [address]    Culture   Final    RARE NORMAL SKIN FLORA NO ANAEROBES ISOLATED; CULTURE IN PROGRESS FOR 5 DAYS    Report Status PENDING  Incomplete    Please note: You were cared for by a hospitalist during your hospital stay. Once you are discharged, your primary care physician will handle any further medical issues. Please note that NO REFILLS for any discharge medications will be authorized once you are discharged, as it is imperative that you return to your primary care physician (or establish a relationship with a primary care physician if you do not have one) for your post hospital discharge needs so that they can reassess your need for medications and monitor your lab values.    Time  coordinating discharge: 40 minutes  SIGNED:   Shelly Coss, MD  [facility] 06/21/2019, 12:35 PM Pager [phone]  If 7PM-7AM, please contact night-coverage [email or web address] Password [email or web address]

## 2019-06-21 NOTE — TOC Transition Note (Addendum)
Transition of Care [facility]) - CM/SW Discharge Note   Patient Details  Name: Dylan [name] MRN: [id number] Date of Birth: 12-13-1970  Transition of Care [facility]) CM/SW Contact:  Trish Mage, LCSW Phone Number: 06/21/2019, 9:54 AM   Clinical Narrative:   [name] is d/cing to home today.  [facility] will follow up with [facility] services.  He declined an order for wheelchair, stating his apartment is set up in such a way that it would be impractical.  His ride will be here at 1:00. TOC sign off.  Addendum: Called [facility] for ride    Final next level of care: [place] Barriers to Discharge: No Barriers Identified   Patient Goals and CMS Choice Patient states their goals for this hospitalization and ongoing recovery are:: "I have everything I need at this point."      Discharge Placement                       Discharge Plan and Services   Discharge Planning Services: CM Consult Post Acute Care Choice: Home Health                               Social Determinants of Health (SDOH) Interventions     Readmission Risk Interventions No flowsheet data found.

## 2019-06-21 NOTE — Plan of Care (Signed)

## 2019-06-22 LAB — AEROBIC/ANAEROBIC CULTURE W GRAM STAIN (SURGICAL/DEEP WOUND)
Culture: NORMAL
Gram Stain: NONE SEEN

## 2019-06-22 LAB — CULTURE, BLOOD (ROUTINE X 2)
Culture: NO GROWTH
Culture: NO GROWTH
Special Requests: ADEQUATE
Special Requests: ADEQUATE

## 2019-06-25 NOTE — Telephone Encounter (Signed)
This was handled 06/07/19.  See refill note.

## 2019-06-27 ENCOUNTER — Telehealth: Payer: Self-pay | Admitting: Emergency Medicine

## 2019-06-27 NOTE — Telephone Encounter (Signed)
Okay.  Please call patient. What are his thoughts or is there a concern with home physical therapy?  Is he not interested in proceeding with PT at home?  I also do not see a visit with me anytime soon, and he was recently discharged from the hospital.  Please schedule hospital follow-up and we can talk further.

## 2019-06-27 NOTE — Telephone Encounter (Signed)
Dylan [name] PT called to inform Dr. Carlota Raspberry . Patient hasnt been willing to do therapy services and patient may be a better candidate for hospice. Best contact [phone]

## 2019-06-28 NOTE — Telephone Encounter (Signed)
Spoke with pt, pt has an appt for Monday 5/17 @440  mychart for Dr. Carlota Raspberry to discuss physical  therapy and hospital follow up. Pt has decided that he would rather have PT

## 2019-07-01 ENCOUNTER — Other Ambulatory Visit: Payer: Self-pay

## 2019-07-01 ENCOUNTER — Encounter: Payer: Medicare Other | Admitting: Family Medicine

## 2019-07-01 NOTE — Telephone Encounter (Signed)
I have called pt to triage him for his tele-med visit. Pt stated that he already has PT set up and no longer needs this appointment.   I have stated understanding and appointment has been canceled for the day.

## 2019-07-02 ENCOUNTER — Telehealth: Payer: Self-pay | Admitting: Family Medicine

## 2019-07-02 NOTE — Telephone Encounter (Signed)
Erica with [facility] is calling to ask for palliative referral for [facility] for [facility] care . And( she is wondering if we have a record of a wound on buttocks looks like stage 4. ) need orders for wound care pt states pain  level is a 9  .   Ok to reach out to [place] directly or Insurance claims handler ask for [place]  # [phone] Danae Chen # is [phone]

## 2019-07-03 ENCOUNTER — Other Ambulatory Visit: Payer: Self-pay | Admitting: Family Medicine

## 2019-07-03 ENCOUNTER — Telehealth: Payer: Self-pay

## 2019-07-03 DIAGNOSIS — G893 Neoplasm related pain (acute) (chronic): Secondary | ICD-10-CM

## 2019-07-03 DIAGNOSIS — C210 Malignant neoplasm of anus, unspecified: Secondary | ICD-10-CM

## 2019-07-03 NOTE — Telephone Encounter (Signed)
Patient is requesting a refill of the following medications: Requested Prescriptions   Pending Prescriptions Disp Refills  . Oxycodone HCl 20 MG TABS 150 tablet 0    Sig: Take 1-1.5 tablets (20-30 mg total) by mouth See admin instructions. Take 1 tablet in the morning, take 1.5 tablets at lunch, and take 1 tablet at night    Date of patient request: 07/03/2019 Last office visit:05/20/2019 Video Date of last refill: 06/07/2019 Last refill amount: 150 Follow up time period per chart: no future appt scheduled.

## 2019-07-03 NOTE — Telephone Encounter (Signed)
Left vm to return my call with specifics on what is need for palliative care referral and orders for wound care so that we can get orders and referral sent back promptly for pt.

## 2019-07-03 NOTE — Telephone Encounter (Signed)
Ailene Ravel is out seeing patient for sacral wound where pt had surgery and will assess wound and send orders over for signature.  Verbal given to assess wound and palliative referral given by Ailene Ravel.  Spoke with Marzetta Board at [facility] and referral and orders in place and nothing else to do.  Advised to contact office with any further questions or concerns.

## 2019-07-03 NOTE — Telephone Encounter (Signed)
Requested medication (s) are due for refill today:  yes  Requested medication (s) are on the active medication list:yes   Last refill: 06/07/2019   #150   0 refills  Future visit scheduledNo  Notes to clinic: not delegated  Requested Prescriptions  Pending Prescriptions Disp Refills   Oxycodone HCl 20 MG TABS 150 tablet 0    Sig: Take 1-1.5 tablets (20-30 mg total) by mouth See admin instructions. Take 1 tablet in the morning, take 1.5 tablets at lunch, and take 1 tablet at night      Not Delegated - Analgesics:  Opioid Agonists Failed - 07/03/2019 12:45 PM      Failed - This refill cannot be delegated      Failed - Urine Drug Screen completed in last 360 days.      Passed - Valid encounter within last 6 months    Recent Outpatient Visits           2 days ago    [facility], Ranell Patrick, MD   1 month ago Iron deficiency anemia due to chronic blood loss   [facility], Ranell Patrick, MD   2 months ago Cancer related pain   [facility], Ranell Patrick, MD   3 months ago Squamous cell cancer, anus [facility])   [facility], Ranell Patrick, MD   3 months ago Iron deficiency anemia due to chronic blood loss   [facility], Ranell Patrick, MD

## 2019-07-03 NOTE — Telephone Encounter (Signed)
Medication Refill - Medication: Oxycodone HCl 20 MG TABS   Has the patient contacted their pharmacy? Yes.   (Agent: If no, request that the patient contact the pharmacy for the refill.) (Agent: If yes, when and what did the pharmacy advise?)  Preferred Pharmacy (with phone number or street name):  [id number] [address], [address]  [place] [address]  Phone: [phone] Fax: [phone]     Agent: Please be advised that RX refills may take up to 3 business days. We ask that you follow-up with your pharmacy.

## 2019-07-03 NOTE — Telephone Encounter (Addendum)
Spoke with nurse Ailene Ravel an she will go see patient and write up order for his wound care after assessing.  She will send over orders for dr greene's signature.  She advises I can call over and do referral for palliative care over the phone for pt.  I advised will contact [facility] and she will let them know I will be calling.  Will await order for signature by Carlota Raspberry.  Spoke with Marzetta Board and she said palliative referral in place and nothing else to do.

## 2019-07-04 ENCOUNTER — Other Ambulatory Visit: Payer: Self-pay | Admitting: Hematology and Oncology

## 2019-07-04 DIAGNOSIS — C210 Malignant neoplasm of anus, unspecified: Secondary | ICD-10-CM

## 2019-07-05 ENCOUNTER — Inpatient Hospital Stay: Payer: Medicare Other

## 2019-07-05 ENCOUNTER — Telehealth: Payer: Self-pay

## 2019-07-05 ENCOUNTER — Inpatient Hospital Stay: Payer: Medicare Other | Attending: Hematology and Oncology | Admitting: Hematology and Oncology

## 2019-07-05 MED ORDER — OXYCODONE HCL 20 MG PO TABS: 20.0000 mg | ORAL_TABLET | ORAL | 0 refills | Status: AC

## 2019-07-05 NOTE — Telephone Encounter (Signed)
Spoke with pat . Out him on Dr.Greene schedule 08/07/2019 mychart visit

## 2019-07-05 NOTE — Telephone Encounter (Signed)
Controlled substance database (PDMP) reviewed. No concerns appreciated. Last filled 06/07/19.  Refill ordered.  Video visit was planned on 5/17 with me but he cancelled. Can we check to see if that is being rescheduled?

## 2019-07-05 NOTE — Telephone Encounter (Signed)
Received new referral for Palliative care. Noted recent hospitalization. Phone call placed to patient to check in and offer to schedule a follow up visit with Palliative NP. Patient receptive to this and visit scheduled for 07/12/19 @ 1 pm

## 2019-07-09 ENCOUNTER — Telehealth: Payer: Self-pay | Admitting: Family Medicine

## 2019-07-09 ENCOUNTER — Emergency Department (HOSPITAL_COMMUNITY)
Admission: EM | Admit: 2019-07-09 | Discharge: 2019-07-09 | Disposition: A | Discharge status: Home / Self Care | Payer: Medicare Other | Attending: Emergency Medicine | Admitting: Emergency Medicine

## 2019-07-09 DIAGNOSIS — Z85048 Personal history of other malignant neoplasm of rectum, rectosigmoid junction, and anus: Secondary | ICD-10-CM | POA: Diagnosis not present

## 2019-07-09 DIAGNOSIS — Z79899 Other long term (current) drug therapy: Secondary | ICD-10-CM | POA: Insufficient documentation

## 2019-07-09 DIAGNOSIS — Z933 Colostomy status: Secondary | ICD-10-CM | POA: Diagnosis not present

## 2019-07-09 DIAGNOSIS — N390 Urinary tract infection, site not specified: Secondary | ICD-10-CM

## 2019-07-09 DIAGNOSIS — K6289 Other specified diseases of anus and rectum: Secondary | ICD-10-CM | POA: Diagnosis present

## 2019-07-09 DIAGNOSIS — F17210 Nicotine dependence, cigarettes, uncomplicated: Secondary | ICD-10-CM | POA: Insufficient documentation

## 2019-07-09 DIAGNOSIS — Z8572 Personal history of non-Hodgkin lymphomas: Secondary | ICD-10-CM | POA: Insufficient documentation

## 2019-07-09 DIAGNOSIS — Z960 Presence of urogenital implants: Secondary | ICD-10-CM | POA: Insufficient documentation

## 2019-07-09 LAB — URINALYSIS, ROUTINE W REFLEX MICROSCOPIC
Bilirubin Urine: NEGATIVE
Glucose, UA: NEGATIVE mg/dL
Ketones, ur: NEGATIVE mg/dL
Nitrite: NEGATIVE
Protein, ur: 100 mg/dL — AB
Specific Gravity, Urine: 1.014 (ref 1.005–1.030)
WBC, UA: >50 WBC/hpf — ABNORMAL HIGH (ref 0–5)
pH: 7 (ref 5.0–8.0)

## 2019-07-09 LAB — CBC WITH DIFFERENTIAL/PLATELET
Abs Immature Granulocytes: 0.08 10*3/uL — ABNORMAL HIGH (ref 0.00–0.07)
Basophils Absolute: 0 10*3/uL (ref 0.0–0.1)
Basophils Relative: 0 %
Eosinophils Absolute: 0.1 10*3/uL (ref 0.0–0.5)
Eosinophils Relative: 1 %
HCT: 29 % — ABNORMAL LOW (ref 39.0–52.0)
Hemoglobin: 8.6 g/dL — ABNORMAL LOW (ref 13.0–17.0)
Immature Granulocytes: 1 %
Lymphocytes Relative: 6 %
Lymphs Abs: 0.7 10*3/uL (ref 0.7–4.0)
MCH: 22.6 pg — ABNORMAL LOW (ref 26.0–34.0)
MCHC: 29.7 g/dL — ABNORMAL LOW (ref 30.0–36.0)
MCV: 76.3 fL — ABNORMAL LOW (ref 80.0–100.0)
Monocytes Absolute: 0.8 10*3/uL (ref 0.1–1.0)
Monocytes Relative: 7 %
Neutro Abs: 9.9 10*3/uL — ABNORMAL HIGH (ref 1.7–7.7)
Neutrophils Relative %: 85 %
Platelets: 442 10*3/uL — ABNORMAL HIGH (ref 150–400)
RBC: 3.8 MIL/uL — ABNORMAL LOW (ref 4.22–5.81)
RDW: 24.8 % — ABNORMAL HIGH (ref 11.5–15.5)
WBC: 11.6 10*3/uL — ABNORMAL HIGH (ref 4.0–10.5)
nRBC: 0 % (ref 0.0–0.2)

## 2019-07-09 LAB — BASIC METABOLIC PANEL
Anion gap: 5 (ref 5–15)
BUN: 6 mg/dL (ref 6–20)
CO2: 29 mmol/L (ref 22–32)
Calcium: 9 mg/dL (ref 8.9–10.3)
Chloride: 103 mmol/L (ref 98–111)
Creatinine, Ser: 0.46 mg/dL — ABNORMAL LOW (ref 0.61–1.24)
GFR calc Af Amer: >60 mL/min (ref >60)
GFR calc non Af Amer: >60 mL/min (ref >60)
Glucose, Bld: 78 mg/dL (ref 70–99)
Potassium: 4 mmol/L (ref 3.5–5.1)
Sodium: 137 mmol/L (ref 135–145)

## 2019-07-09 LAB — LACTIC ACID, PLASMA: Lactic Acid, Venous: 1 mmol/L (ref 0.5–1.9)

## 2019-07-09 MED ORDER — OXYCODONE HCL 5 MG PO TABS
30.0000 mg | ORAL_TABLET | Freq: Every day | ORAL | Status: DC
  Administered 2019-07-09: 30 mg via ORAL
  Filled 2019-07-09: qty 6

## 2019-07-09 MED ORDER — SODIUM CHLORIDE 0.9 % IV SOLN: INTRAVENOUS | Status: DC

## 2019-07-09 MED ORDER — OXYCODONE HCL 5 MG PO TABS: 20.0000 mg | ORAL_TABLET | Freq: Two times a day (BID) | ORAL | Status: DC

## 2019-07-09 MED ORDER — CEPHALEXIN 500 MG PO CAPS
500.0000 mg | ORAL_CAPSULE | Freq: Four times a day (QID) | ORAL | 0 refills | Status: DC
Start: 2019-07-09 — End: 2019-08-14

## 2019-07-09 NOTE — Telephone Encounter (Signed)
Chanda with [facility] call to let us know for PT evaluation order is the only they didn't receive .  She will refax   Any questions call Wilfred Curtis [phone]

## 2019-07-09 NOTE — ED Notes (Signed)
[facility] called for transport home. 

## 2019-07-09 NOTE — Progress Notes (Addendum)
TOC CM spoke to [facility] and pt is active with [facility]. Agency had concerns about his caregivers in the home as pt lives alone. TOC CM/CSW spoke to pt and pt's dtr at bedside. Dtr Fidel Levy will be moving in with pt to help care for him at home. Pt is agreeable to Home Hospice with [facility]. Pt is active with Palliative services with [facility]. Offered choice for Home Hospice. Dtr requested to remain with [facility]. Dtr is requesting Hospice contact her at home. Pt scheduled for dc from Ed setting. Notified [facility] liaison with new referral. Received confirmation they will follow up on referral.  ED provider updated.Jonnie Finner RN CCM, [place] ED TOC CM [phone]   07/09/2019 338 pm Made [facility] that pt is agreeable to Home Hospice. [place], [place] ED TOC CM [phone]

## 2019-07-09 NOTE — ED Provider Notes (Signed)
[facility] DEPT Provider Note   CSN: [id number] Arrival date & time: 07/09/19  1209     History Chief Complaint  Patient presents with  . Rectal Pain    Dylan [name] is a 49 y.o. male.  49 year old male with history of metastatic rectal cancer presents with possible UTI.  Patient has a suprapubic catheter and his urine was noted to have changed colors and is also more cloudy.  Patient himself denies any cough or shortness of breath.  Denies any abdominal discomfort.  No emesis.  Denies any myalgias.  No fever or chills.  Was sent by home health for further evaluation.  No treatment use prior to arrival        Past Medical History:  Diagnosis Date  . Cancer ([place]) 2018   anal cancer  . Non Hodgkin's lymphoma ([place]) 1999  . S/P radiation therapy 07/07/14-7/20/216   anal ca     Patient Active Problem List   Diagnosis Date Noted  . Sacral wound 06/17/2019  . Osteomyelitis of sacrum ([place]) 06/17/2019  . Urinary tract infection associated with catheterization of urinary tract, initial encounter ([place]) 06/17/2019  . Goals of care, counseling/discussion 06/17/2019  . Pressure injury of skin 03/11/2019  . Wound discharge 03/09/2019  . Cancer cachexia ([place]) 02/06/2019  . Perforation of rectum from anal cancer s/p diverting colostomy 01/16/2019 02/06/2019  . Anal cellulitis 02/05/2019  . 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 Colostomy - diverting loop colostomy in place Dec 2020 02/05/2019  . Suprapubic tube in place Dec 2020 for rectourethral fistula 02/05/2019  . Sepsis due to undetermined organism ([place]) 02/04/2019  . Microcytic anemia 02/04/2019  . Severe protein-calorie malnutrition ([place]) 10/20/2018  . Hypotension 10/18/2018  . Tachycardia 10/18/2018  . Anemia 10/18/2018  . Iron deficiency 10/18/2018  . Sepsis ([place]) 10/18/2018  . Cancer associated pain 02/23/2017  . Metastatic anal cancer 02/10/2014    Past Surgical  History:  Procedure Laterality Date  . COLOSTOMY  2020   diverting colostomy at [facility]  . ILEOSTOMY  2020   diverting colostomy, [employer]       Family History  Problem Relation Age of Onset  . Stroke Mother     Social History   Tobacco Use  . Smoking status: Current Some Day Smoker    Packs/day: 0.30    Types: Cigarettes  . Smokeless tobacco: Current User  Substance Use Topics  . Alcohol use: No  . Drug use: Not on file    Home Medications Prior to Admission medications   Medication Sig Start Date End Date Taking? Authorizing Provider  acetaminophen (TYLENOL) 325 MG tablet Take 650 mg by mouth every 6 (six) hours as needed for pain. 11/21/18  Yes [provider]  ferrous sulfate 325 (65 FE) MG tablet Take 1 tablet (325 mg total) by mouth daily with breakfast. 06/21/19 09/19/19 Yes Adhikari, Amrit, MD  MELATONIN PO Take 1 capsule by mouth at bedtime as needed (sleep).    Yes [provider]  metroNIDAZOLE (METROGEL) 0.75 % gel Apply 1 application topically daily as needed (rosacea).  11/21/18  Yes [provider]  midodrine (PROAMATINE) 10 MG tablet Take 1 tablet (10 mg total) by mouth 3 (three) times daily with meals. 06/21/19  Yes Shelly Coss, MD  nystatin (MYCOSTATIN/NYSTOP) powder Apply topically 2 (two) times daily. Apply to the macerated area around groin,scrotal area 06/21/19  Yes Adhikari, Amrit, MD  Oxycodone HCl 20 MG TABS Take 1-1.5  tablets (20-30 mg total) by mouth See admin instructions. Take 1 tablet in the morning, take 1.5 tablets at lunch, and take 1 tablet at night 07/05/19  Yes [name], MD  potassium chloride 20 MEQ TBCR Take 20 mEq by mouth daily for 5 days. 06/22/19 06/27/19  Shelly Coss, MD    Allergies    Patient has no known allergies.  Review of Systems   Review of Systems  All other systems reviewed and are negative.   Physical Exam Updated Vital Signs BP 93/64 (BP Location: Right Arm)   Pulse (!) 103    Temp 98.4 F (36.9 C) (Oral)   Resp 18   SpO2 100%   Physical Exam Vitals and nursing note reviewed.  Constitutional:      General: He is not in acute distress.    Appearance: He is underweight. He is not toxic-appearing.  HENT:     Head: Normocephalic and atraumatic.  Eyes:     General: Lids are normal.     Conjunctiva/sclera: Conjunctivae normal.     Pupils: Pupils are equal, round, and reactive to light.  Neck:     Thyroid: No thyroid mass.     Trachea: No tracheal deviation.  Cardiovascular:     Rate and Rhythm: Normal rate and regular rhythm.     Heart sounds: Normal heart sounds. No murmur. No gallop.   Pulmonary:     Effort: Pulmonary effort is normal. No respiratory distress.     Breath sounds: Normal breath sounds. No stridor. No decreased breath sounds, wheezing, rhonchi or rales.  Abdominal:     General: Bowel sounds are normal. There is no distension.     Palpations: Abdomen is soft.     Tenderness: There is no abdominal tenderness. There is no rebound.    Genitourinary:   Musculoskeletal:        General: No tenderness. Normal range of motion.     Cervical back: Normal range of motion and neck supple.  Skin:    General: Skin is warm and dry.     Findings: No abrasion or rash.  Neurological:     General: No focal deficit present.     Mental Status: He is alert and oriented to person, place, and time. Mental status is at baseline.     GCS: GCS eye subscore is 4. GCS verbal subscore is 5. GCS motor subscore is 6.     Cranial Nerves: Cranial nerves are intact.  Psychiatric:        Attention and Perception: Attention normal.     ED Results / Procedures / Treatments   Labs (all labs ordered are listed, but only abnormal results are displayed) Labs Reviewed  URINE CULTURE  CULTURE, BLOOD (ROUTINE X 2)  CULTURE, BLOOD (ROUTINE X 2)  CBC WITH DIFFERENTIAL/PLATELET  BASIC METABOLIC PANEL  URINALYSIS, ROUTINE W REFLEX MICROSCOPIC  LACTIC ACID, PLASMA     EKG None  Radiology No results found.  Procedures Procedures (including critical care time)  Medications Ordered in ED Medications  0.9 %  sodium chloride infusion (has no administration in time range)    ED Course  I have reviewed the triage vital signs and the nursing notes.  Pertinent labs & imaging results that were available during my care of the patient were reviewed by me and considered in my medical decision making (see chart for details).    MDM Rules/Calculators/A&P  Family states that patient normally has a low blood pressure.  He did receive IV fluids by EMS.  His lactate is normal.  Renal function is normal as well.  He denies any symptoms at this time.  Urinalysis consistent with infection.  Will place on antibiotics and discharged home.  Final Clinical Impression(s) / ED Diagnoses Final diagnoses:  None    Rx / DC Orders ED Discharge Orders    None       Lacretia Leigh, MD 07/09/19 1448

## 2019-07-09 NOTE — ED Notes (Signed)
Patient given [place] sandwich and juice per provider.

## 2019-07-09 NOTE — ED Triage Notes (Signed)
Transported by [facility] from home-- sent by home health for further evaluation; presents with massive rectal sore/mass, had surgery 1 year ago. Per family, has lost over 20 pounds in the last month. +hypotensive, initial BP was 70/40-- after 800 cc of NS BP 82/50. Other VS stable with EMS. CBG of 86 mg/dl.

## 2019-07-09 NOTE — ED Notes (Signed)
Attempted to obtain a set of blood cultures, patient has small rolling veins, RN made aware

## 2019-07-11 LAB — URINE CULTURE

## 2019-07-11 NOTE — Telephone Encounter (Signed)
Left a msg for Dylan [name] to find out if she faxed the order for PT evaluation form. If not, it is ok for for verbal order for PT. Give our office a call if any additional information is needed

## 2019-07-12 ENCOUNTER — Other Ambulatory Visit: Payer: Medicare Other | Admitting: Internal Medicine

## 2019-07-12 NOTE — Telephone Encounter (Signed)
Received order from [facility]. Form reviewed, signed and faxed with confirmation to [phone]

## 2019-07-14 ENCOUNTER — Other Ambulatory Visit: Payer: Self-pay | Admitting: Family Medicine

## 2019-07-14 DIAGNOSIS — I959 Hypotension, unspecified: Secondary | ICD-10-CM

## 2019-07-14 LAB — CULTURE, BLOOD (ROUTINE X 2)
Culture: NO GROWTH
Culture: NO GROWTH
Special Requests: ADEQUATE

## 2019-07-14 NOTE — Telephone Encounter (Signed)
Requested medication (s) are due for refill today: -  Requested medication (s) are on the active medication list: no  Last refill:  06/14/19  Future visit scheduled: yes  Notes to clinic:  med was discontinued 06/21/19 Stop taking at discharge"  NT not allowed to fill, refuse non delegated meds   Requested Prescriptions  Pending Prescriptions Disp Refills   midodrine (PROAMATINE) 5 MG tablet [Pharmacy Med Name: MIDODRINE HCL 5 MG TABLET] 90 tablet 0    Sig: TAKE 1 TABLET BY MOUTH THREE TIMES A DAY WITH MEALS      Not Delegated - Cardiovascular: Midodrine Failed - 07/14/2019  1:33 PM      Failed - This refill cannot be delegated      Passed - Last BP in normal range    BP Readings from Last 1 Encounters:  07/09/19 98/69          Passed - Valid encounter within last 12 months    Recent Outpatient Visits           1 week ago    [facility], Ranell Patrick, MD   1 month ago Iron deficiency anemia due to chronic blood loss   [facility], Ranell Patrick, MD   3 months ago Cancer related pain   [facility], Ranell Patrick, MD   3 months ago Squamous cell cancer, anus [facility])   [facility], Ranell Patrick, MD   3 months ago Iron deficiency anemia due to chronic blood loss   [facility], Ranell Patrick, MD       Future Appointments             In 3 weeks Carlota Raspberry Ranell Patrick, MD [facility]

## 2019-07-17 ENCOUNTER — Encounter: Payer: Self-pay | Admitting: Family Medicine

## 2019-07-17 NOTE — Progress Notes (Signed)
Received phone call from on-call hospice nurse, Karena Addison, from [facility] re patient's pain not being well controlled on current regime. Discussed with hospice doctor, Dr Jaymes Graff, via conference call. Hospice will assume Pain management.

## 2019-08-07 ENCOUNTER — Telehealth: Payer: Self-pay

## 2019-08-07 ENCOUNTER — Other Ambulatory Visit: Payer: Self-pay

## 2019-08-07 ENCOUNTER — Telehealth: Payer: Medicare Other | Admitting: Family Medicine

## 2019-08-07 NOTE — Telephone Encounter (Signed)
I have called and spoke with [name] from [facility] about Mr. Dylan [name]. To verify that the hospes provider was taking over the pt's pain medication. I was inform that the hospes provider is taking over pt's pain medication and that they have sent over some forms that they would like Dr. Nyoka Cowden to sign and fax back. I asked them to resend these formes because we have only received one of the forms.

## 2019-08-14 ENCOUNTER — Other Ambulatory Visit: Payer: Self-pay

## 2019-08-14 ENCOUNTER — Encounter (HOSPITAL_BASED_OUTPATIENT_CLINIC_OR_DEPARTMENT_OTHER): Payer: Self-pay

## 2019-08-14 ENCOUNTER — Emergency Department (HOSPITAL_BASED_OUTPATIENT_CLINIC_OR_DEPARTMENT_OTHER)
Admission: EM | Admit: 2019-08-14 | Discharge: 2019-08-14 | Disposition: A | Discharge status: Home / Self Care | Attending: Emergency Medicine | Admitting: Emergency Medicine

## 2019-08-14 ENCOUNTER — Emergency Department (HOSPITAL_BASED_OUTPATIENT_CLINIC_OR_DEPARTMENT_OTHER)

## 2019-08-14 DIAGNOSIS — R279 Unspecified lack of coordination: Secondary | ICD-10-CM | POA: Diagnosis not present

## 2019-08-14 DIAGNOSIS — Y829 Unspecified medical devices associated with adverse incidents: Secondary | ICD-10-CM | POA: Insufficient documentation

## 2019-08-14 DIAGNOSIS — Z85048 Personal history of other malignant neoplasm of rectum, rectosigmoid junction, and anus: Secondary | ICD-10-CM | POA: Insufficient documentation

## 2019-08-14 DIAGNOSIS — R6510 Systemic inflammatory response syndrome (SIRS) of non-infectious origin without acute organ dysfunction: Secondary | ICD-10-CM | POA: Insufficient documentation

## 2019-08-14 DIAGNOSIS — F17210 Nicotine dependence, cigarettes, uncomplicated: Secondary | ICD-10-CM | POA: Insufficient documentation

## 2019-08-14 DIAGNOSIS — R000 Tachycardia, unspecified: Secondary | ICD-10-CM | POA: Diagnosis not present

## 2019-08-14 DIAGNOSIS — Z743 Need for continuous supervision: Secondary | ICD-10-CM | POA: Diagnosis not present

## 2019-08-14 DIAGNOSIS — I959 Hypotension, unspecified: Secondary | ICD-10-CM | POA: Diagnosis not present

## 2019-08-14 DIAGNOSIS — R531 Weakness: Secondary | ICD-10-CM | POA: Diagnosis not present

## 2019-08-14 DIAGNOSIS — T83028S Displacement of other indwelling urethral catheter, sequela: Secondary | ICD-10-CM | POA: Insufficient documentation

## 2019-08-14 DIAGNOSIS — R109 Unspecified abdominal pain: Secondary | ICD-10-CM | POA: Insufficient documentation

## 2019-08-14 DIAGNOSIS — Z933 Colostomy status: Secondary | ICD-10-CM | POA: Insufficient documentation

## 2019-08-14 DIAGNOSIS — T83010S Breakdown (mechanical) of cystostomy catheter, sequela: Secondary | ICD-10-CM

## 2019-08-14 DIAGNOSIS — T8241XS Breakdown (mechanical) of vascular dialysis catheter, sequela: Secondary | ICD-10-CM | POA: Diagnosis not present

## 2019-08-14 LAB — CBC WITH DIFFERENTIAL/PLATELET
Abs Immature Granulocytes: 0.08 10*3/uL — ABNORMAL HIGH (ref 0.00–0.07)
Basophils Absolute: 0 10*3/uL (ref 0.0–0.1)
Basophils Relative: 0 %
Eosinophils Absolute: 0 10*3/uL (ref 0.0–0.5)
Eosinophils Relative: 0 %
HCT: 35.7 % — ABNORMAL LOW (ref 39.0–52.0)
Hemoglobin: 10.8 g/dL — ABNORMAL LOW (ref 13.0–17.0)
Immature Granulocytes: 1 %
Lymphocytes Relative: 7 %
Lymphs Abs: 1.1 10*3/uL (ref 0.7–4.0)
MCH: 22.8 pg — ABNORMAL LOW (ref 26.0–34.0)
MCHC: 30.3 g/dL (ref 30.0–36.0)
MCV: 75.5 fL — ABNORMAL LOW (ref 80.0–100.0)
Monocytes Absolute: 0.9 10*3/uL (ref 0.1–1.0)
Monocytes Relative: 6 %
Neutro Abs: 12.8 10*3/uL — ABNORMAL HIGH (ref 1.7–7.7)
Neutrophils Relative %: 86 %
Platelets: 455 10*3/uL — ABNORMAL HIGH (ref 150–400)
RBC: 4.73 MIL/uL (ref 4.22–5.81)
RDW: 19.5 % — ABNORMAL HIGH (ref 11.5–15.5)
WBC: 14.8 10*3/uL — ABNORMAL HIGH (ref 4.0–10.5)
nRBC: 0 % (ref 0.0–0.2)

## 2019-08-14 LAB — COMPREHENSIVE METABOLIC PANEL
ALT: 11 U/L (ref 0–44)
AST: 11 U/L — ABNORMAL LOW (ref 15–41)
Albumin: 2.4 g/dL — ABNORMAL LOW (ref 3.5–5.0)
Alkaline Phosphatase: 102 U/L (ref 38–126)
Anion gap: 14 (ref 5–15)
BUN: 14 mg/dL (ref 6–20)
CO2: 24 mmol/L (ref 22–32)
Calcium: 12.5 mg/dL — ABNORMAL HIGH (ref 8.9–10.3)
Chloride: 88 mmol/L — ABNORMAL LOW (ref 98–111)
Creatinine, Ser: 0.66 mg/dL (ref 0.61–1.24)
GFR calc Af Amer: >60 mL/min (ref >60)
GFR calc non Af Amer: >60 mL/min (ref >60)
Glucose, Bld: 92 mg/dL (ref 70–99)
Potassium: 4.6 mmol/L (ref 3.5–5.1)
Sodium: 126 mmol/L — ABNORMAL LOW (ref 135–145)
Total Bilirubin: 0.8 mg/dL (ref 0.3–1.2)
Total Protein: 8 g/dL (ref 6.5–8.1)

## 2019-08-14 LAB — PROTIME-INR
INR: 1.1 (ref 0.8–1.2)
Prothrombin Time: 13.9 seconds (ref 11.4–15.2)

## 2019-08-14 LAB — APTT: aPTT: 35 seconds (ref 24–36)

## 2019-08-14 LAB — LACTIC ACID, PLASMA: Lactic Acid, Venous: 2.6 mmol/L (ref 0.5–1.9)

## 2019-08-14 MED ORDER — SODIUM CHLORIDE 0.9 % IV SOLN: 2.0000 g | Freq: Two times a day (BID) | INTRAVENOUS | Status: DC

## 2019-08-14 MED ORDER — CEPHALEXIN 250 MG/5ML PO SUSR: 500.0000 mg | Freq: Four times a day (QID) | ORAL | 0 refills | Status: AC

## 2019-08-14 MED ORDER — SODIUM CHLORIDE 0.9 % IV BOLUS (SEPSIS)
1000.0000 mL | Freq: Once | INTRAVENOUS | Status: AC
  Administered 2019-08-14: 1000 mL via INTRAVENOUS

## 2019-08-14 MED ORDER — SODIUM CHLORIDE 0.9 % IV SOLN
2.0000 g | Freq: Once | INTRAVENOUS | Status: AC
  Administered 2019-08-14: 2 g via INTRAVENOUS
  Filled 2019-08-14: qty 2

## 2019-08-14 MED ORDER — FENTANYL CITRATE (PF) 100 MCG/2ML IJ SOLN
50.0000 ug | Freq: Once | INTRAMUSCULAR | Status: AC
  Administered 2019-08-14: 50 ug via INTRAVENOUS
  Filled 2019-08-14: qty 2

## 2019-08-14 MED ORDER — SODIUM CHLORIDE 0.9 % IV BOLUS (SEPSIS)
250.0000 mL | Freq: Once | INTRAVENOUS | Status: AC
  Administered 2019-08-14: 250 mL via INTRAVENOUS

## 2019-08-14 MED ORDER — CEPHALEXIN 500 MG PO CAPS
500.0000 mg | ORAL_CAPSULE | Freq: Four times a day (QID) | ORAL | 0 refills | Status: AC
Start: 2019-08-14 — End: ?

## 2019-08-14 MED ORDER — LIDOCAINE HCL URETHRAL/MUCOSAL 2 % EX GEL
CUTANEOUS | Status: AC
  Filled 2019-08-14: qty 20

## 2019-08-14 NOTE — ED Provider Notes (Signed)
[facility] EMERGENCY DEPARTMENT Provider Note   CSN: [id number] Arrival date & time: 08/14/19  1651     History Chief Complaint  Patient presents with  . Wound Check    Dylan [name] is a 49 y.o. male with past medical history of Hodgkin's lymphoma, metastatic squamous cell anal carcinoma diagnosed in 2018 status post radiation therapy, diverting colostomy, chronic suprapubic catheter, chronic rectourethral fistula, severe malnutrition presents to the ED from home by EMS for evaluation of malfunctioning suprapubic catheter.  Patient states hospice nurse came to his house on Friday because a suprapubic catheter was not draining.  They changed it that day but states it still not working.  No urine output in his bag.  Reports chronic suprapubic pain and pain in the rectal area from his chronic severe wound.  He thinks the urine is leaking through his rectal wound.  Denies fevers or chills.  HPI     Past Medical History:  Diagnosis Date  . Cancer ([place]) 2018   anal cancer  . Non Hodgkin's lymphoma ([place]) 1999  . S/P radiation therapy 07/07/14-7/20/216   anal ca     Patient Active Problem List   Diagnosis Date Noted  . Sacral wound 06/17/2019  . Osteomyelitis of sacrum ([place]) 06/17/2019  . Urinary tract infection associated with catheterization of urinary tract, initial encounter ([place]) 06/17/2019  . Goals of care, counseling/discussion 06/17/2019  . Pressure injury of skin 03/11/2019  . Wound discharge 03/09/2019  . Cancer cachexia ([place]) 02/06/2019  . Perforation of rectum from anal cancer s/p diverting colostomy 01/16/2019 02/06/2019  . Anal cellulitis 02/05/2019  . 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 Colostomy - diverting loop colostomy in place Dec 2020 02/05/2019  . Suprapubic tube in place Dec 2020 for rectourethral fistula 02/05/2019  . Sepsis due to undetermined organism ([place]) 02/04/2019  . Microcytic anemia 02/04/2019  . Severe  protein-calorie malnutrition ([place]) 10/20/2018  . Hypotension 10/18/2018  . Tachycardia 10/18/2018  . Anemia 10/18/2018  . Iron deficiency 10/18/2018  . Sepsis ([place]) 10/18/2018  . Cancer associated pain 02/23/2017  . Metastatic anal cancer 02/10/2014    Past Surgical History:  Procedure Laterality Date  . COLOSTOMY  2020   diverting colostomy at [facility]  . ILEOSTOMY  2020   diverting colostomy, [employer]       Family History  Problem Relation Age of Onset  . Stroke Mother     Social History   Tobacco Use  . Smoking status: Current Some Day Smoker    Packs/day: 0.30    Types: Cigarettes  . Smokeless tobacco: Current User  Substance Use Topics  . Alcohol use: No  . Drug use: Not on file    Home Medications Prior to Admission medications   Medication Sig Start Date End Date Taking? Authorizing Provider  acetaminophen (TYLENOL) 325 MG tablet Take 650 mg by mouth every 6 (six) hours as needed for pain. 11/21/18   [provider]  cephALEXin (KEFLEX) 250 MG/5ML suspension Take 10 mLs (500 mg total) by mouth 4 (four) times daily for 7 days. 08/14/19 08/21/19  Kinnie Feil, PA-C  cephALEXin (KEFLEX) 500 MG capsule Take 1 capsule (500 mg total) by mouth 4 (four) times daily. 08/14/19   Kinnie Feil, PA-C  ferrous sulfate 325 (65 FE) MG tablet Take 1 tablet (325 mg total) by mouth daily with breakfast. 06/21/19 09/19/19  Shelly Coss, MD  MELATONIN PO Take 1 capsule by mouth at bedtime as  needed (sleep).     [provider]  metroNIDAZOLE (METROGEL) 0.75 % gel Apply 1 application topically daily as needed (rosacea).  11/21/18   [provider]  midodrine (PROAMATINE) 10 MG tablet Take 1 tablet (10 mg total) by mouth 3 (three) times daily with meals. 06/21/19   Shelly Coss, MD  nystatin (MYCOSTATIN/NYSTOP) powder Apply topically 2 (two) times daily. Apply to the macerated area around groin,scrotal area 06/21/19   Shelly Coss, MD  Oxycodone  HCl 20 MG TABS Take 1-1.5 tablets (20-30 mg total) by mouth See admin instructions. Take 1 tablet in the morning, take 1.5 tablets at lunch, and take 1 tablet at night 07/05/19   [name], MD  potassium chloride 20 MEQ TBCR Take 20 mEq by mouth daily for 5 days. 06/22/19 06/27/19  Shelly Coss, MD    Allergies    Patient has no known allergies.  Review of Systems   Review of Systems  Gastrointestinal: Positive for abdominal pain and rectal pain.  All other systems reviewed and are negative.   Physical Exam Updated Vital Signs BP 107/77   Pulse (!) 121   Temp 97.7 F (36.5 C) (Oral)   Resp 20   Ht 6\' 1"  (1.854 m)   Wt 38.6 kg   SpO2 97%   BMI 11.21 kg/m   Physical Exam Vitals and nursing note reviewed.  Constitutional:      Appearance: He is well-developed. He is ill-appearing.     Comments: Extremely cachectic in appearance.  Appears much older than stated age.  Foul odor in the room.  HENT:     Head: Normocephalic and atraumatic.     Comments: Temporal wasting    Right Ear: External ear normal.     Left Ear: External ear normal.     Nose: Nose normal.  Eyes:     Conjunctiva/sclera: Conjunctivae normal.  Cardiovascular:     Rate and Rhythm: Tachycardia present.     Heart sounds: Normal heart sounds.     Comments: Sinus tachycardia in the 110s to 120s.  SBP 97 on arrival. Pulmonary:     Effort: Pulmonary effort is normal.     Breath sounds: Normal breath sounds.  Abdominal:     Palpations: Abdomen is soft.     Tenderness: There is abdominal tenderness.     Comments: Extremely thin abdomen.  Suprapubic abdomen in place without surrounding skin erythema, drainage or bleeding.  Exquisite tenderness in the suprapubic abdomen.  Genitourinary:    Comments: Extensive wound covering entire sacrum, bilateral buttocks and perineum.  Foul odor.  Large amount of gauze removed from the wound for exam. Musculoskeletal:        General: Normal range of motion.      Cervical back: Normal range of motion and neck supple.  Skin:    General: Skin is warm and dry.     Capillary Refill: Capillary refill takes less than 2 seconds.  Neurological:     Mental Status: He is alert and oriented to person, place, and time.  Psychiatric:        Behavior: Behavior normal.        Thought Content: Thought content normal.        Judgment: Judgment normal.     ED Results / Procedures / Treatments   Labs (all labs ordered are listed, but only abnormal results are displayed) Labs Reviewed  LACTIC ACID, PLASMA - Abnormal; Notable for the following components:      Result  Value   Lactic Acid, Venous 2.6 (*)    All other components within normal limits  COMPREHENSIVE METABOLIC PANEL - Abnormal; Notable for the following components:   Sodium 126 (*)    Chloride 88 (*)    Calcium 12.5 (*)    Albumin 2.4 (*)    AST 11 (*)    All other components within normal limits  CBC WITH DIFFERENTIAL/PLATELET - Abnormal; Notable for the following components:   WBC 14.8 (*)    Hemoglobin 10.8 (*)    HCT 35.7 (*)    MCV 75.5 (*)    MCH 22.8 (*)    RDW 19.5 (*)    Platelets 455 (*)    Neutro Abs 12.8 (*)    Abs Immature Granulocytes 0.08 (*)    All other components within normal limits  CULTURE, BLOOD (ROUTINE X 2)  CULTURE, BLOOD (ROUTINE X 2)  URINE CULTURE  APTT  PROTIME-INR  LACTIC ACID, PLASMA  URINALYSIS, ROUTINE W REFLEX MICROSCOPIC    EKG None  Radiology No results found.  Procedures .Critical Care Performed by: Kinnie Feil, PA-C Authorized by: Kinnie Feil, PA-C   Critical care provider statement:    Critical care time (minutes):  45   Critical care was necessary to treat or prevent imminent or life-threatening deterioration of the following conditions:  Sepsis (SIRS)   Critical care was time spent personally by me on the following activities:  Discussions with consultants, evaluation of patient's response to treatment, examination of  patient, ordering and performing treatments and interventions, ordering and review of laboratory studies, ordering and review of radiographic studies, pulse oximetry, re-evaluation of patient's condition, obtaining history from patient or surrogate, review of old charts and development of treatment plan with patient or surrogate   I assumed direction of critical care for this patient from another provider in my specialty: no     (including critical care time)  Medications Ordered in ED Medications  lidocaine (XYLOCAINE) 2 % jelly (has no administration in time range)  ceFEPIme (MAXIPIME) 2 g in sodium chloride 0.9 % 100 mL IVPB (has no administration in time range)  ceFEPIme (MAXIPIME) 2 g in sodium chloride 0.9 % 100 mL IVPB (2 g Intravenous New Bag/Given 08/14/19 1811)  sodium chloride 0.9 % bolus 1,000 mL (1,000 mLs Intravenous New Bag/Given 08/14/19 1809)    And  sodium chloride 0.9 % bolus 250 mL (250 mLs Intravenous New Bag/Given 08/14/19 1826)  fentaNYL (SUBLIMAZE) injection 50 mcg (50 mcg Intravenous Given 08/14/19 1823)    ED Course  I have reviewed the triage vital signs and the nursing notes.  Pertinent labs & imaging results that were available during my care of the patient were reviewed by me and considered in my medical decision making (see chart for details).  Clinical Course as of Aug 13 1920  Wed Aug 14, 2019  1703 Temp: 97.7 F (36.5 C) [CG]  1703 Pulse Rate(!): 116 [CG]  1703 Resp(!): 22 [CG]  1703 BP: 94/78 [CG]  1817 Pulse Rate(!): 116 [CG]  1817 Resp(!): 22 [CG]  1817 WBC(!): 14.8 [CG]  1817 Sodium(!): 126 [CG]  1817 Chloride(!): 88 [CG]  9892 Hospice RN reviewed patient's chart.  Confirms recent code status change on 6/25 to DNR.     [CG]  Worth daughter [phone]   [CG]  1830 Lactic Acid, Venous(!!): 2.6 [CG]    Clinical Course User Index [CG] Kinnie Feil, PA-C   MDM Rules/Calculators/A&P  49 year old male with  the above past medical history presents to the ED for malfunctioning suprapubic catheter.  He meets SIRS criteria with tachycardia, tachypnea and possible source of infection in the rectal wound.  Wound is foul smelling.   Prior to obtaining lab work, initiating IV fluids antibiotics I obtained permission from patient who agreed to do this.   I have ordered sepsis order set including cefepime, 30 cc normal saline IV fluid.   EDP at bedside during encounter.  He exchange suprapubic catheter.  When suprapubic catheter was irrigated there was some fluid drainage noted from the rectal wound this is likely from the fistula.  Patient had significant pain during the procedure unfortunately.  I reviewed patient's medical records available to assist with obtaining history and MDM.    In summary, he has advanced anal cancer with metastases and chronic extensive sacral/rectal wound.  He presented to ED for suprapubic catheter and admitted on 06/2019 for possible SIRS/sepsis.  Blood cultures had no growth.  Urethral rectal fistula noted during that admission.  General surgery evaluated patient and he is not a surgical candidate due to poor prognosis.  He is no longer receiving cancer treatment.  He is a hospice patient.  I spoke to hospice nurse Blanca who reviewed patient's chart.  It appears hospice RN has been to patient's home 5 times since Friday due to suprapubic catheter malfunction.  They are aware of patient's urethral rectal fistula.  The last couple of days patient has declined.  He is now bedbound.  He is sleeping for more than 18 hours a day.  Decreased appetite only now tolerating liquids.  His CODE STATUS changed to DNR on 6/25.  Pending labs.   1912: I have personally visualized and interpreted ER work-up.  Leukocytosis 14.8 with lactic acidosis 12.6.  Hyponatremia.  Patient reevaluated several times.  Does not appear to be in acute distress or severe pain.  Continues to be tachycardic.  SBP slightly improved 107.  Suprapubic catheter in place now but unlikely to function properly moving forward.  Explained this to daughter, urine may be leaking from rectal wound. Recommended wound packing.  Had lengthy discussion with patient's daughter at bedside who is teary-eyed about end-of-life discussion.  I discussed clinical presentation is consistent with possible SIRS with likely source being the rectal wound.  I discussed our concern that patient is likely experiencing an infection that his body may not tolerate or survive.  It is likely that patient has weeks or only a couple of months to live based on how his body responds.  Based on recent decline in the last 2 weeks patient is dying.   I offered two options of treatment to patient and daughter including admission to hospital for IV fluids, antibiotics and close monitoring as well as end-of-life care.  I explained hospital admission and treatment may prolong the patient's life slightly but would not be curative.  Alternative option is to discharge patient home with home hospice end-of-life care to be with family and kept comfortable.  Both patient and daughter were very clear in their decision and requested discharge.  Daughter is full caregiver.  She requested liquid antibiotic. Will give keflex.  Patient shared with EDP, Darl Householder. Updated hospice nurse who will follow up with patient accordingly.   Final Clinical Impression(s) / ED Diagnoses Final diagnoses:  SIRS (systemic inflammatory response syndrome) ([place])  Suprapubic catheter dysfunction, sequela    Rx / DC Orders ED Discharge Orders  Ordered    cephALEXin (KEFLEX) 250 MG/5ML suspension  4 times daily     Discontinue  Reprint     08/14/19 1852    cephALEXin (KEFLEX) 500 MG capsule  4 times daily     Discontinue  Reprint     08/14/19 1904           Arlean Hopping 08/14/19 Olegario Shearer, MD 08/17/19 [phone]

## 2019-08-14 NOTE — ED Notes (Signed)
MD and PA at bedside , pt care , inserting foley

## 2019-08-14 NOTE — Discharge Instructions (Addendum)
Dylan [name] was seen in the ER for malfunction of suprapubic catheter  We discussed fistula (tunnel) connecting his urinary tract to his rectal wound  His work up today and vital signs suggested a possible generalized infection. This is likely form the wound  We discussed options of admission to hospital for IV medicines or discharge with hospice end of life care.  You chose to be discharged home.   You requested antibiotics. Keflex liquid was prescribed to you.  Hospice does not cover liquid keflex, so you may have to purchase this out of pocket. I have also given you a prescription for keflex capsules that you may sprinkle in liquids.   We will message hospice care for further hospice care at home.

## 2019-08-14 NOTE — Progress Notes (Signed)
Pharmacy Antibiotic Note  Dylan [name] is a 49 y.o. male admitted on 08/14/2019 with sepsis.  Pharmacy has been consulted for Cefepime dosing.   Height: 6\' 1"  (185.4 cm) Weight: 38.6 kg (85 lb) IBW/kg (Calculated) : 79.9  Temp (24hrs), Avg:97.7 F (36.5 C), Min:97.7 F (36.5 C), Max:97.7 F (36.5 C)  Recent Labs  Lab 08/14/19 1752  WBC 14.8*  CREATININE 0.66  LATICACIDVEN 2.6*    Estimated Creatinine Clearance: 59.6 mL/min (by C-G formula based on SCr of 0.66 mg/dL).    No Known Allergies  Antimicrobials this admission: 6/30 Cefepime >>   Dose adjustments this admission: N/a  Microbiology results: Pending   Plan:  - Patient is a low weight adult reported at 38.6 kg however will continue with Cefepime 2g IV q12h which is ~ 50mg /kg/dose (peds dosing) - Monitor patients renal function and urine output  - De-escalate ABX when appropriate   Thank you for allowing pharmacy to be a part of this patient's care.  Duanne Limerick PharmD. BCPS 08/14/2019 6:29 PM

## 2019-08-14 NOTE — Progress Notes (Addendum)
Manufacturing engineer [facility]) hospital liaison note  *addendum:  This RN reviewed pt's chart. Pt elected DNR on 08/09/19; OOF DNR in home.  Pt has recently experienced decreased urine output through cath, reporting urine draining through fistula and seeming to seep from sacral wound.   Cath has been replaced with no increase in urine output.  Most recent VS from 08/08/19:  BP 80/50, HR 118, RR 18, SpO2 98%.  Hospice RN charts pt is sleeping 17-18 hours a day, bedbound >2weeks, bites only of food.*  Mr. Trowbridge is a hospice patient with [facility].  Please do not hesitate to call with questions or concerns.    Thank you for the opportunity to participate in this patient's care.  Domenic Moras, BSN, RN [facility] hospital liaison [phone] (24h on call)

## 2019-08-14 NOTE — ED Notes (Signed)
Called [facility] @ [phone] they are on the way spoke to [facility]

## 2019-08-14 NOTE — ED Triage Notes (Signed)
Pt here via [facility] from home, daughter caretaker, suprapubic cath not draining Friday. Hospice nurse placed on Friday. Is urinating but per caregiver urine is coming out of 'bottom'. Hx of rectal cancer, incisions on back for cancer removal, bandaged wounds with diaper on, wound not visible. Hypotensive, hypothermic, & tachy in triage.

## 2019-08-19 LAB — CULTURE, BLOOD (ROUTINE X 2)
Culture: NO GROWTH
Special Requests: ADEQUATE

## 2019-08-30 ENCOUNTER — Telehealth: Payer: Self-pay | Admitting: Family Medicine

## 2019-08-30 NOTE — Telephone Encounter (Signed)
Advised by hospice that patient passed on 09/01/2019.  Time of death was 1820 at his home.  Death certificate received from [facility].  Completed today. Condolences given to daughter Janet [place], no needs expressed at this time.

## 2019-09-15 DEATH — deceased

## 2019-11-12 ENCOUNTER — Telehealth: Payer: Self-pay | Admitting: Family Medicine

## 2019-11-12 NOTE — Telephone Encounter (Signed)
Paperwork has been dropped off for Dr. Carlota Raspberry to fill out. I have placed in Dr. Ardyth Gal red box. Paperwork needs to be faxed to [phone].

## 2019-11-13 NOTE — Telephone Encounter (Signed)
Per Russella Dar paperwork has been completed an faxed to the appropriate party.

## 2021-05-21 IMAGING — CT CT ABD-PELV W/ CM
2 of 5 series · 14 of 46 positions shown, 16 images · IV contrast (APPLIED)
Comparison: PET-CT 04/27/2010. CT pelvis 03/09/2010.

CLINICAL DATA: 48-year-old with personal history of Hodgkin's
lymphoma, now with recurrent squamous cell carcinoma of the anus and
scrotum which was initially diagnosed in 7527 (secondary to genital
condylomata), for which the patient underwent radiation therapy at
that time but is currently undergoing no treatment. He presents now
with intermittent rectal bleeding over the past 6 months and a 20
pound weight loss over the past 6 months. He also complains of
abdominal distention.

EXAM:
CT ABDOMEN AND PELVIS WITH CONTRAST
TECHNIQUE: Multidetector CT imaging of the abdomen and pelvis was performed
using the standard protocol following bolus administration of
intravenous contrast.
CONTRAST:  100mL OMNIPAQUE IOHEXOL 300 MG/ML IV.

[Series 3: abd/ pelvis 5.0 i30f 2 · axial · 0.74mm/px · z∈[+866,+1336]mm · 11 of 106 slices shown, 13 images]
[im 6/106  soft-tissue]
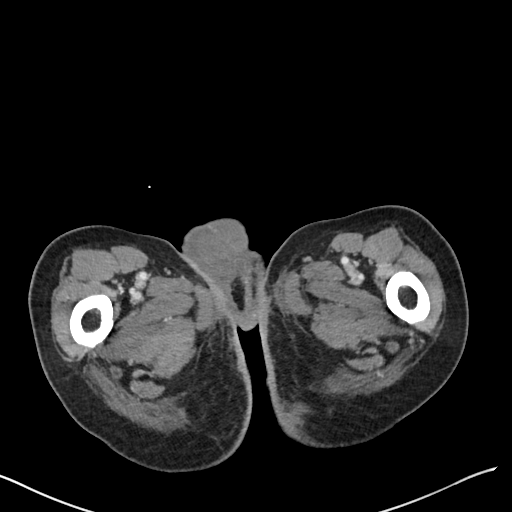
[im 6/106  bone]
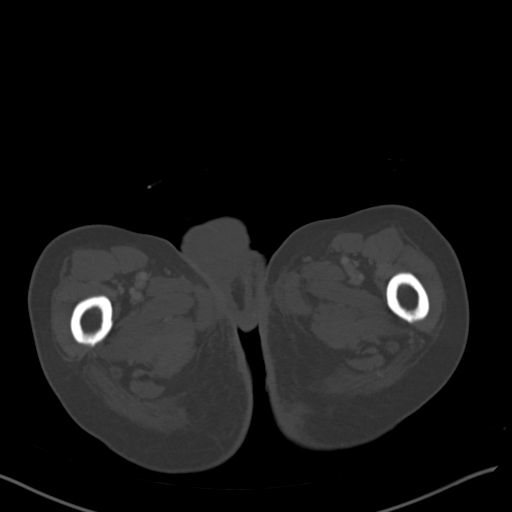
[im 16/106  soft-tissue]
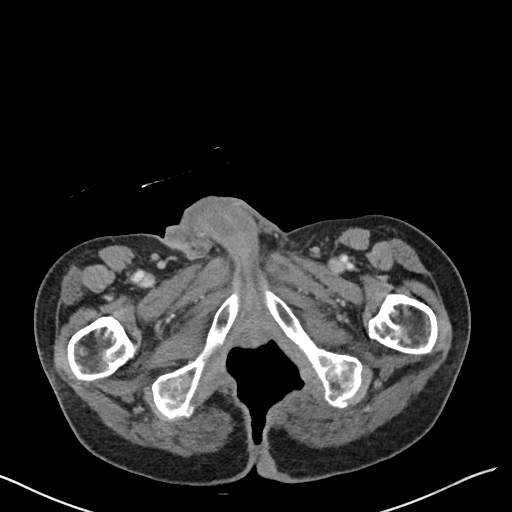
[im 27/106  soft-tissue]
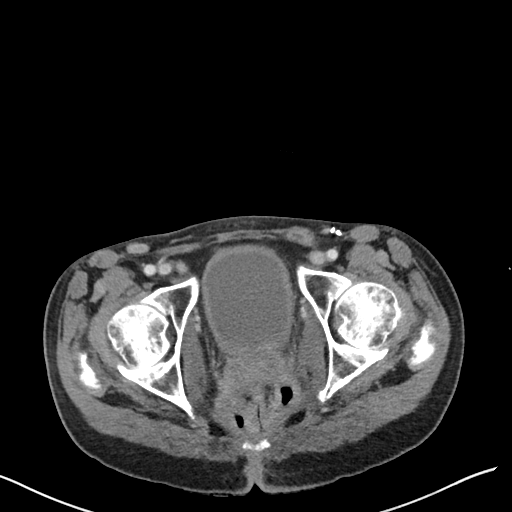
[im 37/106  soft-tissue]
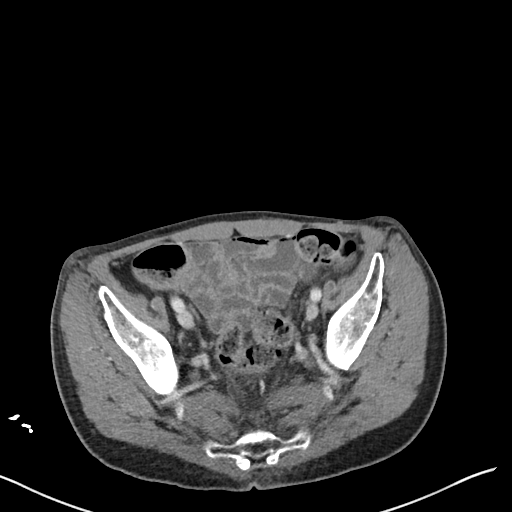
[im 43/106  soft-tissue]
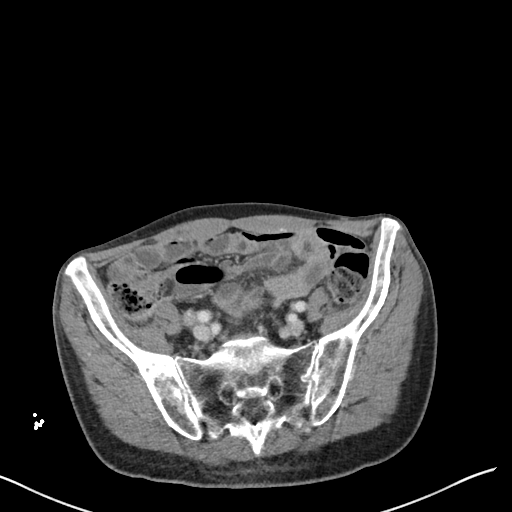
[im 53/106  soft-tissue]
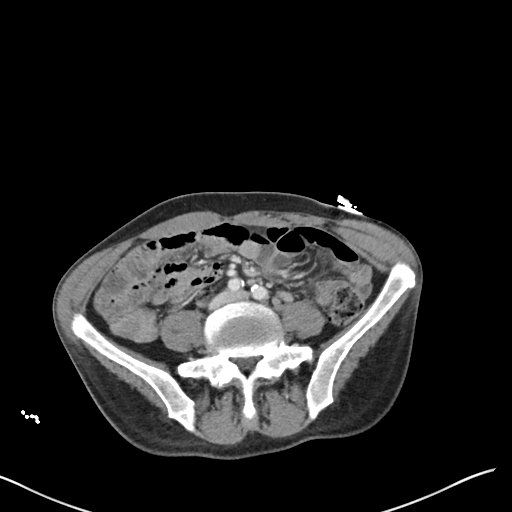
[im 64/106  soft-tissue]
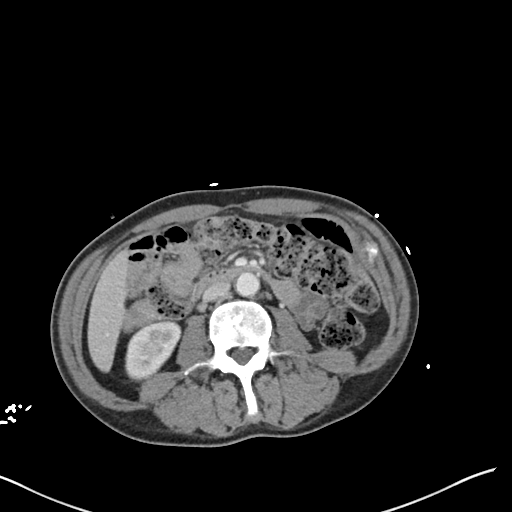
[im 69/106  soft-tissue]
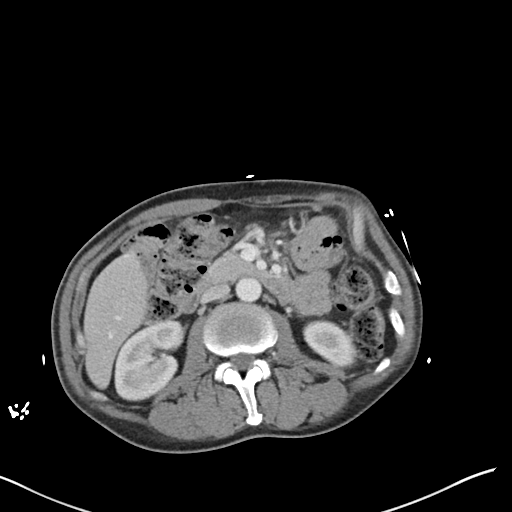
[im 79/106  soft-tissue]
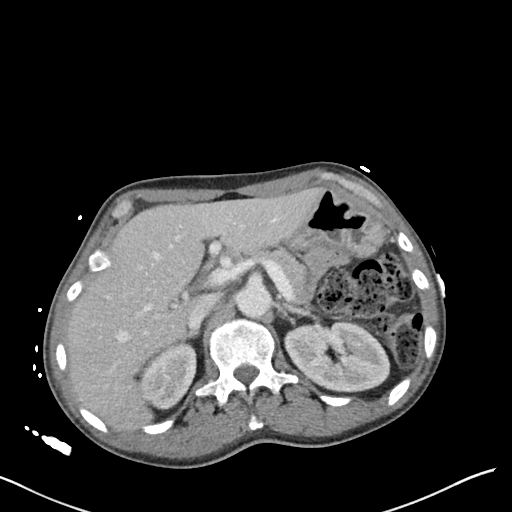
[im 79/106  bone]
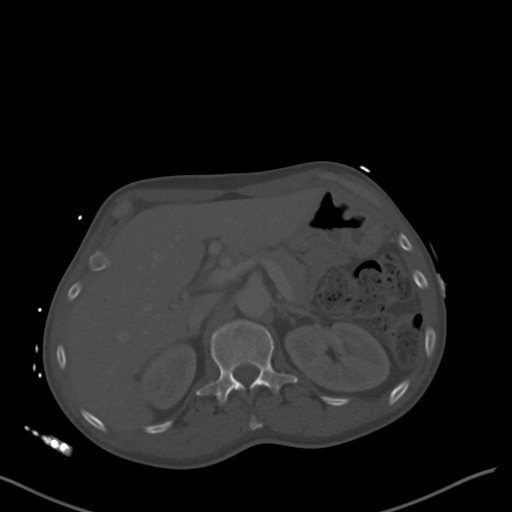
[im 90/106  soft-tissue]
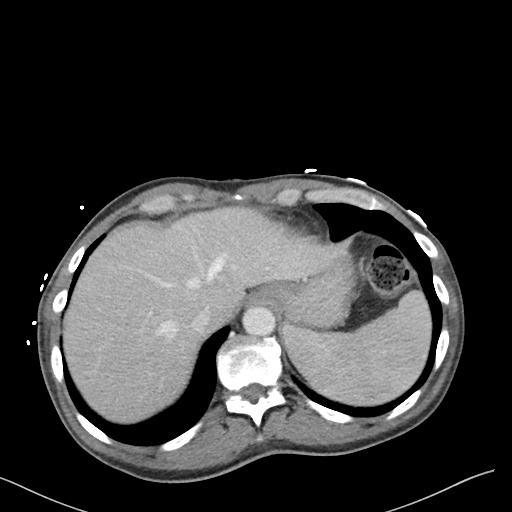
[im 100/106  soft-tissue]
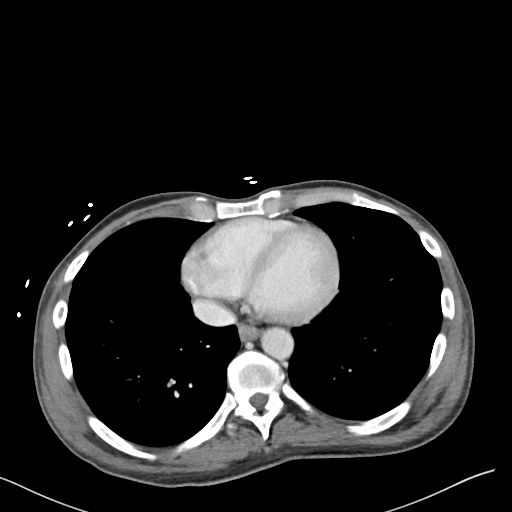

[Series 6: coronal soft tissue · coronal · 0.75mm/px · 3 of 93 slices shown]
[im 41/93  soft-tissue]
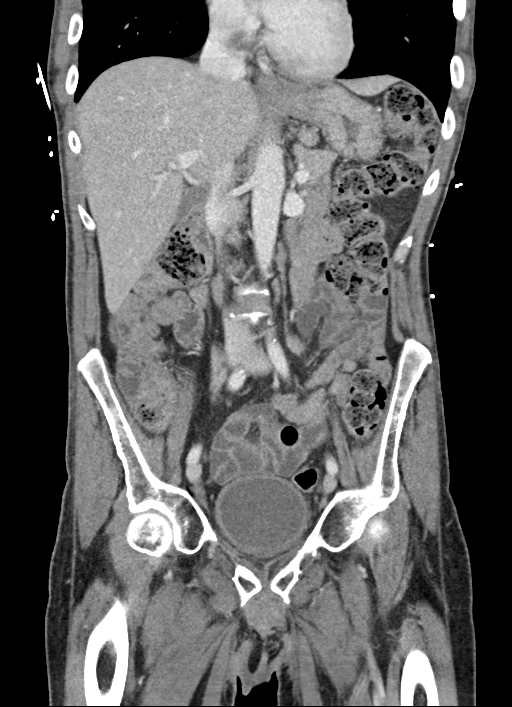
[im 52/93  soft-tissue]
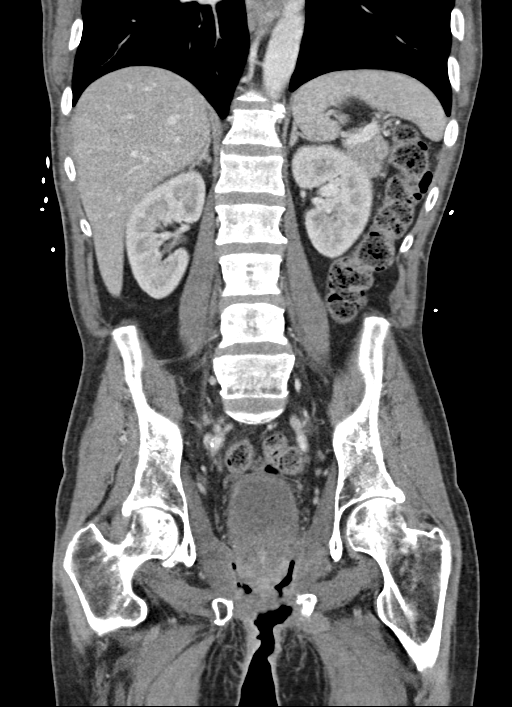
[im 62/93  soft-tissue]
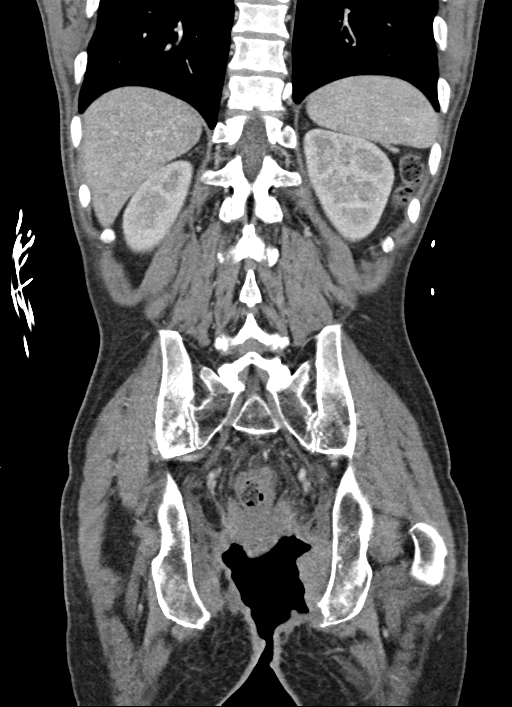

[14 of 46 positions shown; findings below may reference images not displayed]

FINDINGS: Lower chest: Respiratory motion blurred images of the lung bases.
Visualized lung bases clear. Heart size normal.

Hepatobiliary: Liver upper normal in size to perhaps mildly enlarged
without focal parenchymal abnormality. Gallbladder contracted
without evidence of calcified gallstones. No biliary ductal
dilation.

Pancreas: Normal in appearance without evidence of mass, ductal
dilation, or inflammation.

Spleen: Normal in size and appearance.

Adrenals/Urinary Tract: Normal appearing adrenal glands. Kidneys
normal in size and appearance without focal parenchymal abnormality.
No hydronephrosis. No evidence of urinary tract calculi. Mild
diffuse bladder wall thickening.

Stomach/Bowel: Stomach normal in appearance for the degree of
distention. Normal-appearing small bowel. Very large stool burden
involving the transverse colon, descending colon, sigmoid colon and
rectum. Liquid stool in a relatively decompressed ascending colon.
Appendix not visible but no pericecal inflammation.

Vascular/Lymphatic: Moderate to severe aorto-iliofemoral
atherosclerosis without evidence of aneurysm. Normal-appearing
portal venous and systemic venous systems.

Numerous upper normal sized retroperitoneal lymph nodes in the
paracaval, LEFT periaortic, LEFT common iliac and RIGHT common iliac
stations, not significantly changed since the prior PET-CT. Mildly
enlarged gastrohepatic ligament lymph node. No nodal masses

Reproductive: Prostate gland and seminal vesicles normal in size and
appearance for age.

Other: Open wound involving the perineum, with what I believe is a
necrotic mass involving the perineum and perianal region. Small
amount of stool in the gluteal fold, indicating mild incontinence.
Post radiation changes are present in the presacral space and in the
LOWER pelvis. It is difficult to measure the mass due to the
necrosis and the associated post radiation changes.

Musculoskeletal: Subchondral cysts in the UPPER femoral heads
bilaterally without evidence of collapse. Lucent lesion involving
the L3 vertebral body. No focal osseous lesions elsewhere.
IMPRESSION: 1. Open wound involving the perineum and perianal region, with what
I believe to be a necrotic mass involving the perineum and perianal
region. It is difficult to measure the mass due to the necrosis and
the associated post radiation changes in the pelvis.
2. Borderline to mild hepatomegaly.  No no hepatic masses.
3. Indeterminate lucent lesion involving the L3 vertebral body. No
focal osseous lesions elsewhere.
4. Mild diffuse bladder wall thickening, query cystitis.
5. Large colonic stool burden.
6. Small amount of stool in the gluteal fold, indicating mild
incontinence.

Aortic Atherosclerosis ([id number]).

## 2021-09-07 IMAGING — CT CT ABD-PELV W/ CM
2 of 5 series · 14 of 46 positions shown, 16 images · IV contrast (omnipaque)
Comparison: 10/18/2018.

CLINICAL DATA: Hypotension and tachycardia. Severe rectal pain.
Status post anal and scrotal cancer resection 3 weeks ago with a
diverting ostomy.

EXAM:
CT ABDOMEN AND PELVIS WITH CONTRAST
TECHNIQUE: Multidetector CT imaging of the abdomen and pelvis was performed
using the standard protocol following bolus administration of
intravenous contrast.
CONTRAST:  100mL OMNIPAQUE IOHEXOL 300 MG/ML  SOLN

[Series 2: axial st · axial · 0.71mm/px · z∈[-554,-119]mm · 11 of 101 slices shown, 13 images]
[im 7/101  soft-tissue]
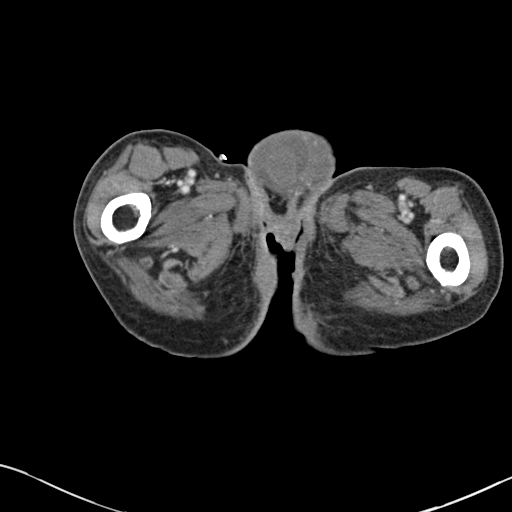
[im 7/101  bone]
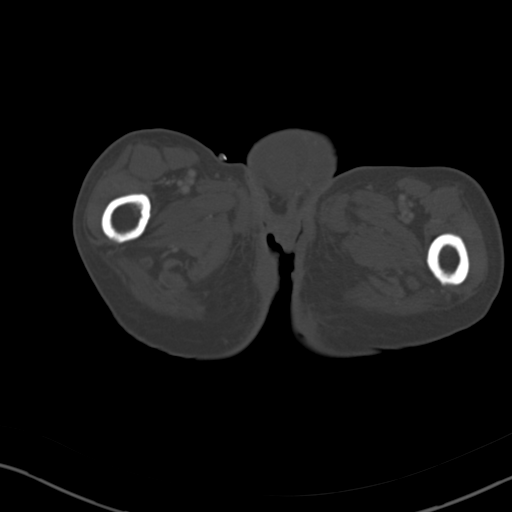
[im 14/101  soft-tissue]
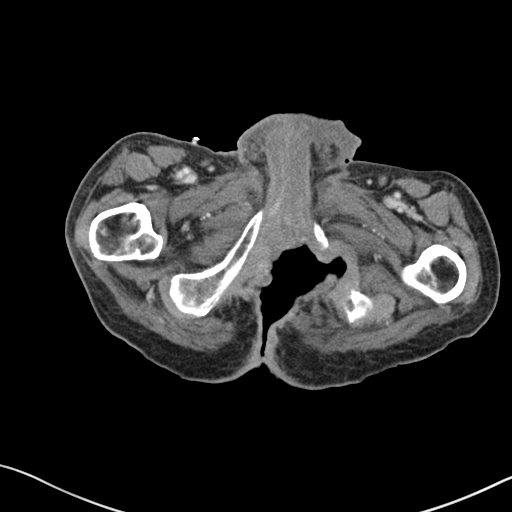
[im 27/101  soft-tissue]
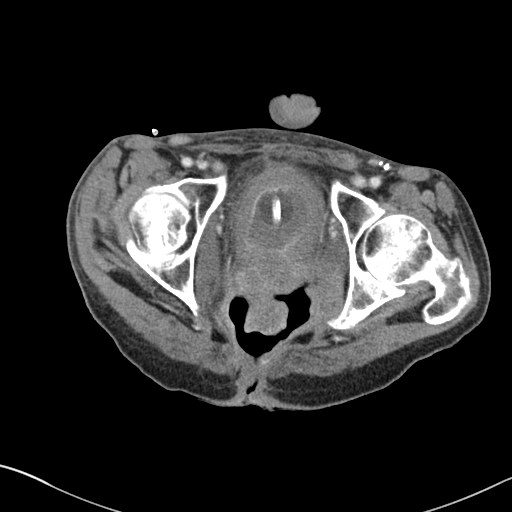
[im 34/101  soft-tissue]
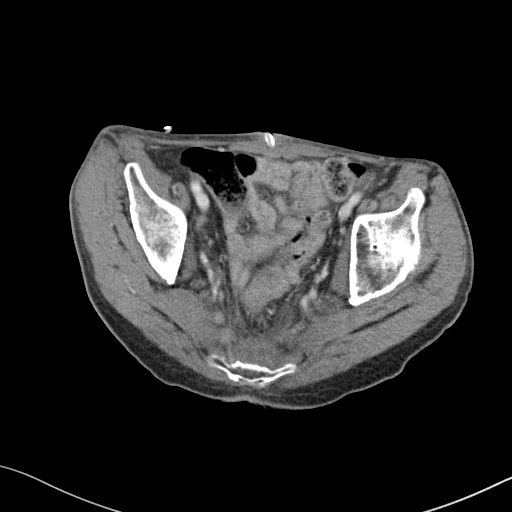
[im 41/101  soft-tissue]
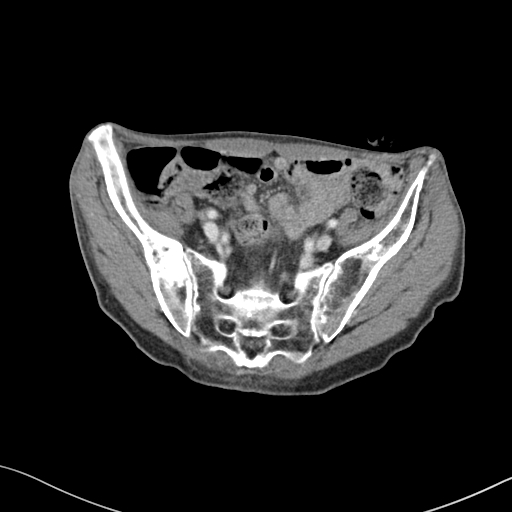
[im 54/101  soft-tissue]
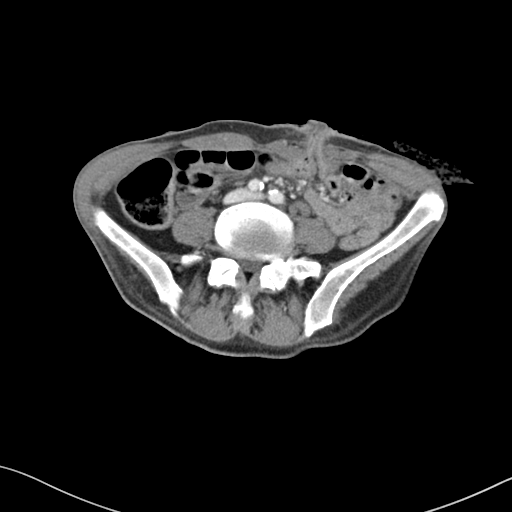
[im 61/101  soft-tissue]
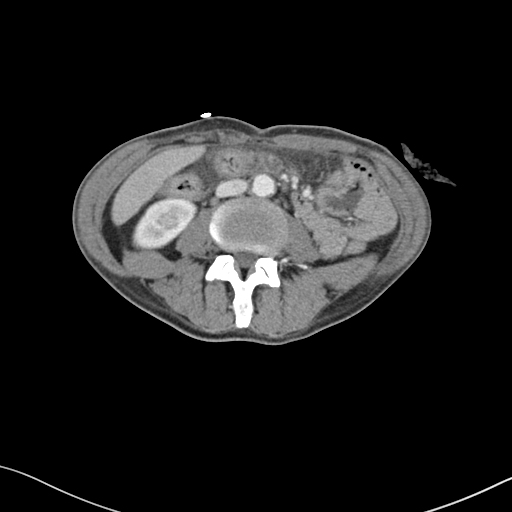
[im 67/101  soft-tissue]
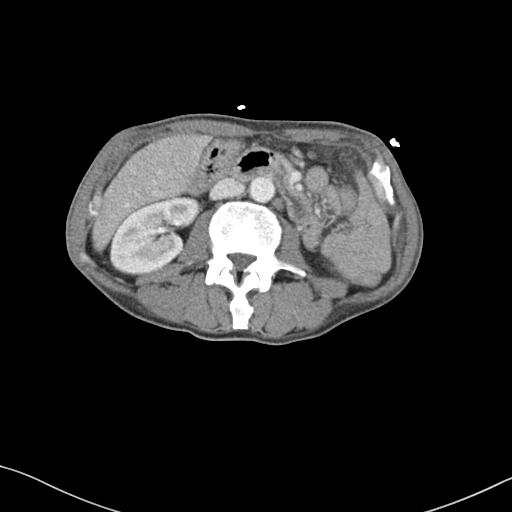
[im 74/101  soft-tissue]
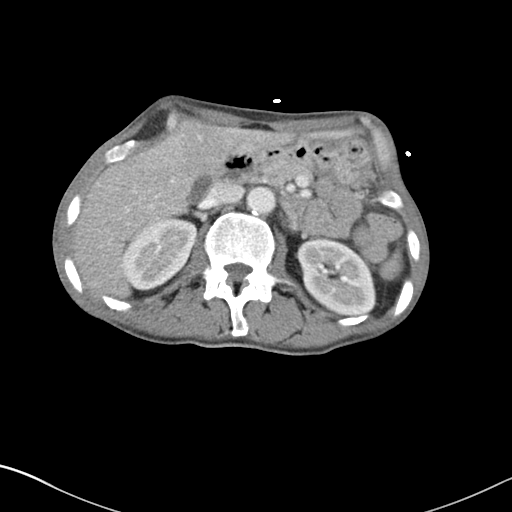
[im 74/101  bone]
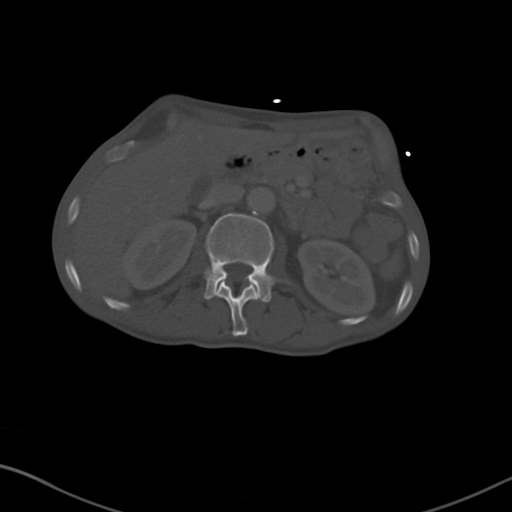
[im 87/101  soft-tissue]
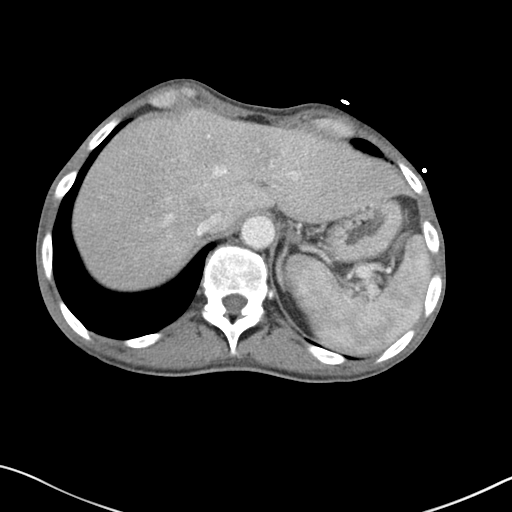
[im 94/101  soft-tissue]
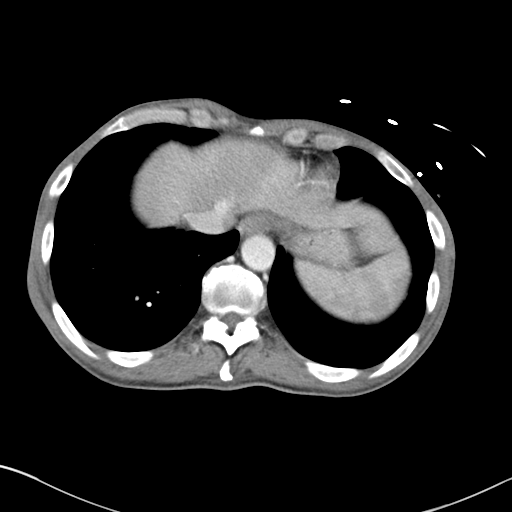

[Series 5: coronal st · coronal · 0.66mm/px · 3 of 119 slices shown]
[im 40/119  soft-tissue]
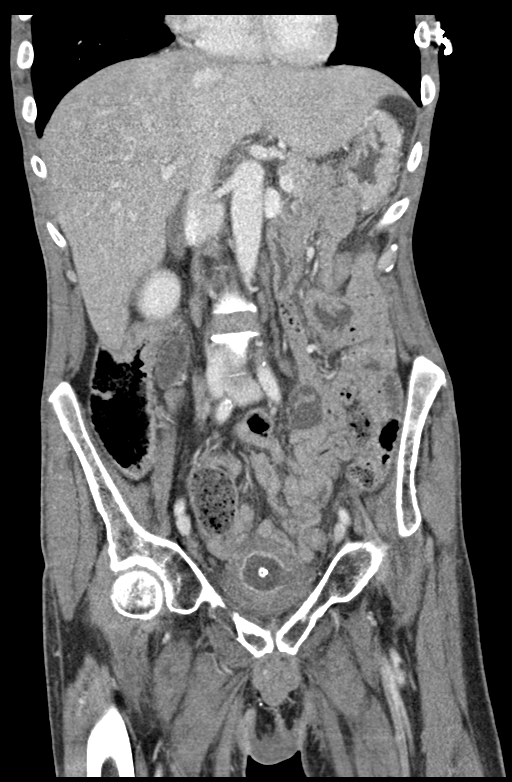
[im 53/119  soft-tissue]
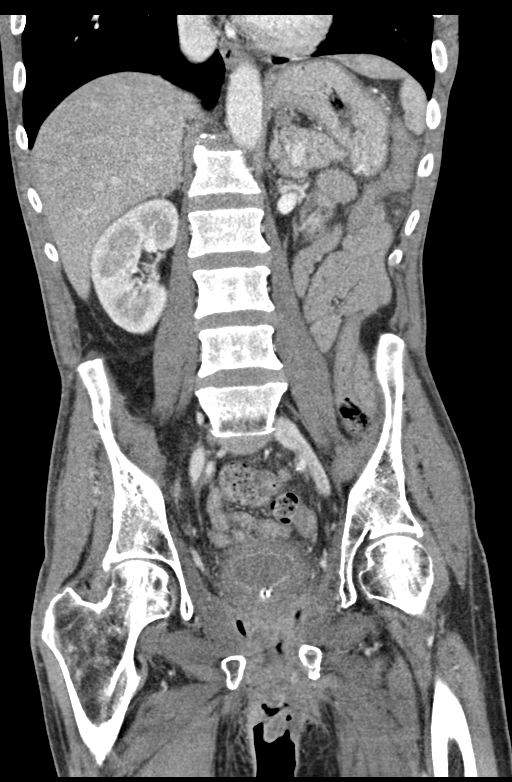
[im 66/119  soft-tissue]
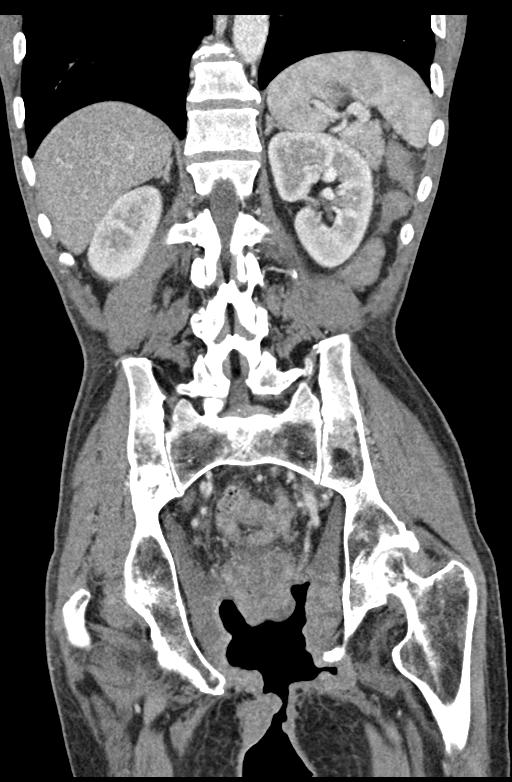

[14 of 46 positions shown; findings below may reference images not displayed]

FINDINGS: Lower chest: Multiple interval small nodules in the right lower lobe
and right middle lobe. The largest is in the right lower lobe,
measuring 6 mm in maximum diameter on image number 13 series 4.
Normal sized heart.

Hepatobiliary: No focal liver abnormality is seen. No gallstones,
gallbladder wall thickening, or biliary dilatation.

Pancreas: Unremarkable. No pancreatic ductal dilatation or
surrounding inflammatory changes.

Spleen: Normal in size without focal abnormality.

Adrenals/Urinary Tract: Interval suprapubic Foley catheter in the
urinary bladder with interval moderate to marked diffuse low density
bladder wall thickening, mucosal enhancement and surrounding soft
tissue stranding.

Unremarkable adrenal glands, kidneys and ureters.

Stomach/Bowel: Unremarkable stomach, small bowel and colon with an
interval left lower quadrant ostomy.

Vascular/Lymphatic: Mild atheromatous arterial calcifications
without aneurysm. The previously described mildly prominent,
enhancing, ill-defined right external iliac and distal common iliac
artery lymph nodes are again demonstrated. A proximal right external
iliac node has a short axis diameter of 6 mm on image number 59
series 2, previously 8 mm.

Reproductive: Moderately enlarged and heterogeneous prostate gland
with a mild increase in low density heterogeneity, contiguous with
the thickened bladder wall posteriorly. This is also contiguous with
a mass protruding into the previously demonstrated open cavity in
the perineal region. Inferiorly, this mass measures 3.3 x 2.6 cm on
image number 75 series 2.

A moderate-sized right hydrocele has not changed significantly.

Other: There is an interval increase in size of a soft tissue mass
at the inferior aspect of the large cavity in the perineal region,
at the level of the proximal right thigh. This mass measures 4.8 x
2.6 cm on image number 92 series 2, previously approximately 2.4 x
1.6 cm.

Musculoskeletal: Stable ill-defined curvilinear density in the L3
vertebral body and patchy increased and decreased density in the
right iliac bone. Multiple small bilateral femoral head cysts are
again noted.
IMPRESSION: 1. Interval suprapubic Foley catheter with interval moderate to
marked diffuse low density bladder wall thickening, mucosal
enhancement and surrounding soft tissue stranding, compatible with
marked cystitis.
2. Interval increase in size of a soft tissue mass in the inferior
aspect of the large cavity in the perineal region, at the level of
the proximal right thigh, compatible with the patient's known
malignancy.
3. No significant change in an additional mass in the superior
aspect of the large cavity, compatible with additional known
malignancy.
4. Interval multiple small nodules in the right lower lobe and right
middle lobe, suspicious for metastases.
5. Mildly improved probable right common and external iliac
metastatic adenopathy.
6. Stable ill-defined curvilinear density in the L3 vertebral body
and patchy increased and decreased density in the right iliac bone.
These could represent treated metastases.
7. Stable moderate-sized right hydrocele.

## 2022-03-17 NOTE — Progress Notes (Signed)
This encounter was created in error - please disregard.
